# Patient Record
Sex: Male | Born: 1945 | Race: White | Hispanic: No | Marital: Married | Smoking: Former smoker
Health system: Southern US, Community
[De-identification: ages and names within clinical notes are randomized; demographics above are authoritative.]

## PROBLEM LIST (undated history)

## (undated) DIAGNOSIS — R972 Elevated prostate specific antigen [PSA]: Secondary | ICD-10-CM

## (undated) DIAGNOSIS — I517 Cardiomegaly: Secondary | ICD-10-CM

## (undated) DIAGNOSIS — F329 Major depressive disorder, single episode, unspecified: Secondary | ICD-10-CM

## (undated) DIAGNOSIS — K219 Gastro-esophageal reflux disease without esophagitis: Secondary | ICD-10-CM

## (undated) DIAGNOSIS — K635 Polyp of colon: Secondary | ICD-10-CM

## (undated) DIAGNOSIS — I219 Acute myocardial infarction, unspecified: Secondary | ICD-10-CM

## (undated) DIAGNOSIS — I4510 Unspecified right bundle-branch block: Secondary | ICD-10-CM

## (undated) DIAGNOSIS — M5136 Other intervertebral disc degeneration, lumbar region: Secondary | ICD-10-CM

## (undated) DIAGNOSIS — N200 Calculus of kidney: Secondary | ICD-10-CM

## (undated) DIAGNOSIS — I2510 Atherosclerotic heart disease of native coronary artery without angina pectoris: Secondary | ICD-10-CM

## (undated) DIAGNOSIS — C642 Malignant neoplasm of left kidney, except renal pelvis: Secondary | ICD-10-CM

## (undated) DIAGNOSIS — G4700 Insomnia, unspecified: Secondary | ICD-10-CM

## (undated) DIAGNOSIS — M51369 Other intervertebral disc degeneration, lumbar region without mention of lumbar back pain or lower extremity pain: Secondary | ICD-10-CM

## (undated) DIAGNOSIS — K449 Diaphragmatic hernia without obstruction or gangrene: Secondary | ICD-10-CM

## (undated) DIAGNOSIS — N400 Enlarged prostate without lower urinary tract symptoms: Secondary | ICD-10-CM

## (undated) DIAGNOSIS — M4802 Spinal stenosis, cervical region: Secondary | ICD-10-CM

## (undated) DIAGNOSIS — C679 Malignant neoplasm of bladder, unspecified: Secondary | ICD-10-CM

## (undated) DIAGNOSIS — F32A Depression, unspecified: Secondary | ICD-10-CM

## (undated) DIAGNOSIS — N183 Chronic kidney disease, stage 3 unspecified: Secondary | ICD-10-CM

## (undated) DIAGNOSIS — I4819 Other persistent atrial fibrillation: Secondary | ICD-10-CM

## (undated) DIAGNOSIS — E785 Hyperlipidemia, unspecified: Secondary | ICD-10-CM

## (undated) DIAGNOSIS — R000 Tachycardia, unspecified: Secondary | ICD-10-CM

## (undated) DIAGNOSIS — Z79899 Other long term (current) drug therapy: Secondary | ICD-10-CM

## (undated) DIAGNOSIS — K429 Umbilical hernia without obstruction or gangrene: Secondary | ICD-10-CM

## (undated) DIAGNOSIS — I10 Essential (primary) hypertension: Secondary | ICD-10-CM

## (undated) DIAGNOSIS — G4730 Sleep apnea, unspecified: Secondary | ICD-10-CM

## (undated) DIAGNOSIS — Z7901 Long term (current) use of anticoagulants: Secondary | ICD-10-CM

## (undated) DIAGNOSIS — I700 Atherosclerosis of aorta: Secondary | ICD-10-CM

## (undated) DIAGNOSIS — C649 Malignant neoplasm of unspecified kidney, except renal pelvis: Secondary | ICD-10-CM

## (undated) HISTORY — PX: OTHER SURGICAL HISTORY: SHX169

## (undated) HISTORY — DX: Major depressive disorder, single episode, unspecified: F32.9

## (undated) HISTORY — DX: Malignant neoplasm of left kidney, except renal pelvis: C64.2

## (undated) HISTORY — DX: Elevated prostate specific antigen (PSA): R97.20

## (undated) HISTORY — DX: Polyp of colon: K63.5

## (undated) HISTORY — DX: Depression, unspecified: F32.A

## (undated) HISTORY — DX: Acute myocardial infarction, unspecified: I21.9

## (undated) HISTORY — PX: CARDIAC ELECTROPHYSIOLOGY STUDY AND ABLATION: SHX1294

## (undated) HISTORY — PX: COLONOSCOPY: SHX174

## (undated) HISTORY — DX: Hyperlipidemia, unspecified: E78.5

---

## 2007-06-30 DIAGNOSIS — I471 Supraventricular tachycardia, unspecified: Secondary | ICD-10-CM

## 2007-06-30 HISTORY — PX: SVT ABLATION: EP1225

## 2007-06-30 HISTORY — DX: Supraventricular tachycardia, unspecified: I47.10

## 2016-07-15 DIAGNOSIS — K219 Gastro-esophageal reflux disease without esophagitis: Secondary | ICD-10-CM | POA: Diagnosis not present

## 2016-07-15 DIAGNOSIS — E785 Hyperlipidemia, unspecified: Secondary | ICD-10-CM | POA: Diagnosis not present

## 2016-07-15 DIAGNOSIS — G4700 Insomnia, unspecified: Secondary | ICD-10-CM | POA: Diagnosis not present

## 2016-09-14 DIAGNOSIS — Z9889 Other specified postprocedural states: Secondary | ICD-10-CM | POA: Diagnosis not present

## 2016-09-14 DIAGNOSIS — I471 Supraventricular tachycardia: Secondary | ICD-10-CM | POA: Diagnosis not present

## 2016-10-01 DIAGNOSIS — I10 Essential (primary) hypertension: Secondary | ICD-10-CM | POA: Diagnosis not present

## 2016-10-01 DIAGNOSIS — E784 Other hyperlipidemia: Secondary | ICD-10-CM | POA: Diagnosis not present

## 2016-10-06 DIAGNOSIS — Z1283 Encounter for screening for malignant neoplasm of skin: Secondary | ICD-10-CM | POA: Diagnosis not present

## 2016-10-06 DIAGNOSIS — L821 Other seborrheic keratosis: Secondary | ICD-10-CM | POA: Diagnosis not present

## 2016-10-09 DIAGNOSIS — I10 Essential (primary) hypertension: Secondary | ICD-10-CM | POA: Diagnosis not present

## 2016-10-09 DIAGNOSIS — E784 Other hyperlipidemia: Secondary | ICD-10-CM | POA: Diagnosis not present

## 2016-10-20 DIAGNOSIS — K219 Gastro-esophageal reflux disease without esophagitis: Secondary | ICD-10-CM | POA: Diagnosis not present

## 2016-10-20 DIAGNOSIS — E785 Hyperlipidemia, unspecified: Secondary | ICD-10-CM | POA: Diagnosis not present

## 2016-10-20 DIAGNOSIS — G4700 Insomnia, unspecified: Secondary | ICD-10-CM | POA: Diagnosis not present

## 2017-02-11 DIAGNOSIS — E784 Other hyperlipidemia: Secondary | ICD-10-CM | POA: Diagnosis not present

## 2017-02-11 DIAGNOSIS — I10 Essential (primary) hypertension: Secondary | ICD-10-CM | POA: Diagnosis not present

## 2017-02-11 DIAGNOSIS — Z125 Encounter for screening for malignant neoplasm of prostate: Secondary | ICD-10-CM | POA: Diagnosis not present

## 2017-02-23 DIAGNOSIS — R972 Elevated prostate specific antigen [PSA]: Secondary | ICD-10-CM | POA: Diagnosis not present

## 2017-02-23 DIAGNOSIS — I10 Essential (primary) hypertension: Secondary | ICD-10-CM | POA: Diagnosis not present

## 2017-02-23 DIAGNOSIS — E785 Hyperlipidemia, unspecified: Secondary | ICD-10-CM | POA: Diagnosis not present

## 2017-03-22 DIAGNOSIS — R972 Elevated prostate specific antigen [PSA]: Secondary | ICD-10-CM | POA: Diagnosis not present

## 2017-04-06 DIAGNOSIS — F323 Major depressive disorder, single episode, severe with psychotic features: Secondary | ICD-10-CM | POA: Diagnosis not present

## 2017-04-06 DIAGNOSIS — H2513 Age-related nuclear cataract, bilateral: Secondary | ICD-10-CM | POA: Diagnosis not present

## 2017-04-15 DIAGNOSIS — F322 Major depressive disorder, single episode, severe without psychotic features: Secondary | ICD-10-CM | POA: Diagnosis not present

## 2017-04-19 DIAGNOSIS — Z23 Encounter for immunization: Secondary | ICD-10-CM | POA: Diagnosis not present

## 2017-05-03 DIAGNOSIS — F321 Major depressive disorder, single episode, moderate: Secondary | ICD-10-CM | POA: Diagnosis not present

## 2017-06-02 DIAGNOSIS — F320 Major depressive disorder, single episode, mild: Secondary | ICD-10-CM | POA: Diagnosis not present

## 2017-07-06 DIAGNOSIS — F320 Major depressive disorder, single episode, mild: Secondary | ICD-10-CM | POA: Diagnosis not present

## 2017-08-02 DIAGNOSIS — F324 Major depressive disorder, single episode, in partial remission: Secondary | ICD-10-CM | POA: Diagnosis not present

## 2017-09-20 DIAGNOSIS — R972 Elevated prostate specific antigen [PSA]: Secondary | ICD-10-CM | POA: Diagnosis not present

## 2017-10-04 DIAGNOSIS — F320 Major depressive disorder, single episode, mild: Secondary | ICD-10-CM | POA: Diagnosis not present

## 2017-11-04 DIAGNOSIS — F320 Major depressive disorder, single episode, mild: Secondary | ICD-10-CM | POA: Diagnosis not present

## 2017-12-13 DIAGNOSIS — F325 Major depressive disorder, single episode, in full remission: Secondary | ICD-10-CM | POA: Diagnosis not present

## 2018-01-17 DIAGNOSIS — F4321 Adjustment disorder with depressed mood: Secondary | ICD-10-CM | POA: Diagnosis not present

## 2018-01-17 DIAGNOSIS — F325 Major depressive disorder, single episode, in full remission: Secondary | ICD-10-CM | POA: Diagnosis not present

## 2018-01-24 DIAGNOSIS — R972 Elevated prostate specific antigen [PSA]: Secondary | ICD-10-CM | POA: Diagnosis not present

## 2018-01-24 DIAGNOSIS — N3941 Urge incontinence: Secondary | ICD-10-CM | POA: Diagnosis not present

## 2018-03-10 DIAGNOSIS — F4321 Adjustment disorder with depressed mood: Secondary | ICD-10-CM | POA: Diagnosis not present

## 2018-03-10 DIAGNOSIS — F325 Major depressive disorder, single episode, in full remission: Secondary | ICD-10-CM | POA: Diagnosis not present

## 2018-03-14 ENCOUNTER — Encounter: Payer: Self-pay | Admitting: Emergency Medicine

## 2018-03-14 ENCOUNTER — Inpatient Hospital Stay
Admission: EM | Admit: 2018-03-14 | Discharge: 2018-03-16 | DRG: 251 | Disposition: A | Discharge status: Other Acute Inpatient Hospital | Payer: Medicare Other | Attending: Internal Medicine | Admitting: Internal Medicine

## 2018-03-14 ENCOUNTER — Other Ambulatory Visit: Payer: Self-pay

## 2018-03-14 ENCOUNTER — Ambulatory Visit (INDEPENDENT_AMBULATORY_CARE_PROVIDER_SITE_OTHER)
Admission: EM | Admit: 2018-03-14 | Discharge: 2018-03-14 | Disposition: A | Discharge status: Home / Self Care | Payer: Medicare Other | Source: Home / Self Care | Attending: Family Medicine | Admitting: Family Medicine

## 2018-03-14 ENCOUNTER — Emergency Department: Payer: Medicare Other

## 2018-03-14 DIAGNOSIS — R0789 Other chest pain: Secondary | ICD-10-CM | POA: Diagnosis not present

## 2018-03-14 DIAGNOSIS — Z79899 Other long term (current) drug therapy: Secondary | ICD-10-CM

## 2018-03-14 DIAGNOSIS — I25110 Atherosclerotic heart disease of native coronary artery with unstable angina pectoris: Secondary | ICD-10-CM | POA: Diagnosis present

## 2018-03-14 DIAGNOSIS — I2582 Chronic total occlusion of coronary artery: Secondary | ICD-10-CM | POA: Diagnosis present

## 2018-03-14 DIAGNOSIS — I208 Other forms of angina pectoris: Secondary | ICD-10-CM | POA: Diagnosis not present

## 2018-03-14 DIAGNOSIS — I481 Persistent atrial fibrillation: Secondary | ICD-10-CM | POA: Diagnosis present

## 2018-03-14 DIAGNOSIS — E860 Dehydration: Secondary | ICD-10-CM | POA: Diagnosis present

## 2018-03-14 DIAGNOSIS — Z8249 Family history of ischemic heart disease and other diseases of the circulatory system: Secondary | ICD-10-CM | POA: Diagnosis not present

## 2018-03-14 DIAGNOSIS — I4891 Unspecified atrial fibrillation: Secondary | ICD-10-CM

## 2018-03-14 DIAGNOSIS — R61 Generalized hyperhidrosis: Secondary | ICD-10-CM | POA: Diagnosis not present

## 2018-03-14 DIAGNOSIS — Z23 Encounter for immunization: Secondary | ICD-10-CM | POA: Diagnosis not present

## 2018-03-14 DIAGNOSIS — I214 Non-ST elevation (NSTEMI) myocardial infarction: Secondary | ICD-10-CM

## 2018-03-14 DIAGNOSIS — Z7982 Long term (current) use of aspirin: Secondary | ICD-10-CM

## 2018-03-14 DIAGNOSIS — Z79891 Long term (current) use of opiate analgesic: Secondary | ICD-10-CM

## 2018-03-14 DIAGNOSIS — R45851 Suicidal ideations: Secondary | ICD-10-CM

## 2018-03-14 DIAGNOSIS — R079 Chest pain, unspecified: Secondary | ICD-10-CM | POA: Diagnosis not present

## 2018-03-14 DIAGNOSIS — R7989 Other specified abnormal findings of blood chemistry: Secondary | ICD-10-CM | POA: Diagnosis present

## 2018-03-14 HISTORY — DX: Tachycardia, unspecified: R00.0

## 2018-03-14 HISTORY — DX: Non-ST elevation (NSTEMI) myocardial infarction: I21.4

## 2018-03-14 LAB — MAGNESIUM: Magnesium: 2.2 mg/dL (ref 1.7–2.4)

## 2018-03-14 LAB — PROTIME-INR
INR: 1.03
PROTHROMBIN TIME: 13.4 s (ref 11.4–15.2)

## 2018-03-14 LAB — T4, FREE: Free T4: 0.98 ng/dL (ref 0.82–1.77)

## 2018-03-14 LAB — URINALYSIS, COMPLETE (UACMP) WITH MICROSCOPIC
BACTERIA UA: NONE SEEN
Bilirubin Urine: NEGATIVE
Glucose, UA: NEGATIVE mg/dL
Ketones, ur: NEGATIVE mg/dL
Leukocytes, UA: NEGATIVE
Nitrite: NEGATIVE
PROTEIN: NEGATIVE mg/dL
SPECIFIC GRAVITY, URINE: 1.018 (ref 1.005–1.030)
Squamous Epithelial / LPF: NONE SEEN (ref 0–5)
pH: 5 (ref 5.0–8.0)

## 2018-03-14 LAB — CBC
HCT: 42.1 % (ref 40.0–52.0)
HEMOGLOBIN: 14.7 g/dL (ref 13.0–18.0)
MCH: 29.8 pg (ref 26.0–34.0)
MCHC: 34.9 g/dL (ref 32.0–36.0)
MCV: 85.6 fL (ref 80.0–100.0)
PLATELETS: 191 10*3/uL (ref 150–440)
RBC: 4.92 MIL/uL (ref 4.40–5.90)
RDW: 13.8 % (ref 11.5–14.5)
WBC: 8.4 10*3/uL (ref 3.8–10.6)

## 2018-03-14 LAB — BASIC METABOLIC PANEL
ANION GAP: 7 (ref 5–15)
BUN: 25 mg/dL — ABNORMAL HIGH (ref 8–23)
CHLORIDE: 106 mmol/L (ref 98–111)
CO2: 25 mmol/L (ref 22–32)
CREATININE: 1 mg/dL (ref 0.61–1.24)
Calcium: 9.2 mg/dL (ref 8.9–10.3)
GFR calc non Af Amer: >60 mL/min (ref >60)
Glucose, Bld: 112 mg/dL — ABNORMAL HIGH (ref 70–99)
Potassium: 4 mmol/L (ref 3.5–5.1)
SODIUM: 138 mmol/L (ref 135–145)

## 2018-03-14 LAB — TSH
TSH: 2.75 u[IU]/mL (ref 0.350–4.500)
TSH: 2.289 u[IU]/mL (ref 0.350–4.500)

## 2018-03-14 LAB — BRAIN NATRIURETIC PEPTIDE: B Natriuretic Peptide: 180 pg/mL — ABNORMAL HIGH (ref 0.0–100.0)

## 2018-03-14 LAB — HEPARIN LEVEL (UNFRACTIONATED)

## 2018-03-14 LAB — TROPONIN I
TROPONIN I: 0.48 ng/mL — AB (ref <0.03)
Troponin I: 2.52 ng/mL (ref <0.03)
Troponin I: 5.92 ng/mL (ref <0.03)

## 2018-03-14 LAB — APTT: aPTT: 25 seconds (ref 24–36)

## 2018-03-14 MED ORDER — HEPARIN BOLUS VIA INFUSION
2850.0000 [IU] | Freq: Once | INTRAVENOUS | Status: AC
  Administered 2018-03-14: 2850 [IU] via INTRAVENOUS
  Filled 2018-03-14: qty 2850

## 2018-03-14 MED ORDER — ASPIRIN EC 81 MG PO TBEC
81.0000 mg | DELAYED_RELEASE_TABLET | Freq: Every day | ORAL | Status: DC
  Administered 2018-03-15: 81 mg via ORAL
  Filled 2018-03-14: qty 1

## 2018-03-14 MED ORDER — HEPARIN (PORCINE) IN NACL 100-0.45 UNIT/ML-% IJ SOLN
1500.0000 [IU]/h | INTRAMUSCULAR | Status: DC
  Administered 2018-03-14: 1100 [IU]/h via INTRAVENOUS
  Filled 2018-03-14 (×2): qty 250

## 2018-03-14 MED ORDER — ASPIRIN 81 MG PO CHEW
324.0000 mg | CHEWABLE_TABLET | Freq: Once | ORAL | Status: AC
  Administered 2018-03-14: 324 mg via ORAL

## 2018-03-14 MED ORDER — METOPROLOL TARTRATE 25 MG PO TABS
12.5000 mg | ORAL_TABLET | Freq: Two times a day (BID) | ORAL | Status: DC
  Administered 2018-03-14 – 2018-03-15 (×4): 12.5 mg via ORAL
  Filled 2018-03-14 (×4): qty 1

## 2018-03-14 MED ORDER — ASPIRIN 81 MG PO CHEW: 324.0000 mg | CHEWABLE_TABLET | ORAL | Status: AC

## 2018-03-14 MED ORDER — TRAZODONE HCL 50 MG PO TABS
50.0000 mg | ORAL_TABLET | Freq: Every day | ORAL | Status: DC
  Administered 2018-03-14 – 2018-03-15 (×2): 50 mg via ORAL
  Filled 2018-03-14 (×3): qty 1

## 2018-03-14 MED ORDER — SODIUM CHLORIDE 0.9% FLUSH: 3.0000 mL | Freq: Two times a day (BID) | INTRAVENOUS | Status: DC

## 2018-03-14 MED ORDER — OXYCODONE-ACETAMINOPHEN 5-325 MG PO TABS: 1.0000 | ORAL_TABLET | Freq: Four times a day (QID) | ORAL | Status: DC | PRN

## 2018-03-14 MED ORDER — ACETAMINOPHEN 325 MG PO TABS: 650.0000 mg | ORAL_TABLET | ORAL | Status: DC | PRN

## 2018-03-14 MED ORDER — NITROGLYCERIN 0.4 MG SL SUBL
0.4000 mg | SUBLINGUAL_TABLET | SUBLINGUAL | Status: DC | PRN
  Administered 2018-03-16: 0.4 mg via SUBLINGUAL

## 2018-03-14 MED ORDER — SODIUM CHLORIDE 0.9 % IV SOLN: 250.0000 mL | INTRAVENOUS | Status: DC | PRN

## 2018-03-14 MED ORDER — INFLUENZA VAC SPLIT HIGH-DOSE 0.5 ML IM SUSY: 0.5000 mL | PREFILLED_SYRINGE | INTRAMUSCULAR | Status: DC

## 2018-03-14 MED ORDER — ASPIRIN 300 MG RE SUPP: 300.0000 mg | RECTAL | Status: AC

## 2018-03-14 MED ORDER — ZOLPIDEM TARTRATE 5 MG PO TABS
5.0000 mg | ORAL_TABLET | Freq: Every evening | ORAL | Status: DC | PRN
  Administered 2018-03-14: 5 mg via ORAL
  Filled 2018-03-14: qty 1

## 2018-03-14 MED ORDER — ASPIRIN 81 MG PO CHEW: 324.0000 mg | CHEWABLE_TABLET | Freq: Once | ORAL | Status: DC

## 2018-03-14 MED ORDER — HEPARIN SODIUM (PORCINE) 5000 UNIT/ML IJ SOLN
4000.0000 [IU] | Freq: Once | INTRAMUSCULAR | Status: AC
  Administered 2018-03-14: 4000 [IU] via INTRAVENOUS

## 2018-03-14 MED ORDER — ONDANSETRON HCL 4 MG/2ML IJ SOLN: 4.0000 mg | Freq: Four times a day (QID) | INTRAMUSCULAR | Status: DC | PRN

## 2018-03-14 MED ORDER — SODIUM CHLORIDE 0.9 % IV SOLN
INTRAVENOUS | Status: AC
  Administered 2018-03-14 – 2018-03-15 (×2): via INTRAVENOUS

## 2018-03-14 MED ORDER — ATORVASTATIN CALCIUM 20 MG PO TABS
40.0000 mg | ORAL_TABLET | Freq: Every day | ORAL | Status: DC
  Administered 2018-03-14 – 2018-03-15 (×2): 40 mg via ORAL
  Filled 2018-03-14 (×2): qty 2

## 2018-03-14 MED ORDER — ALPRAZOLAM 0.5 MG PO TABS
0.2500 mg | ORAL_TABLET | Freq: Two times a day (BID) | ORAL | Status: DC | PRN
  Administered 2018-03-15: 0.25 mg via ORAL
  Filled 2018-03-14: qty 1

## 2018-03-14 MED ORDER — MORPHINE SULFATE (PF) 4 MG/ML IV SOLN: 4.0000 mg | INTRAVENOUS | Status: DC | PRN

## 2018-03-14 MED ORDER — SODIUM CHLORIDE 0.9% FLUSH: 3.0000 mL | INTRAVENOUS | Status: DC | PRN

## 2018-03-14 NOTE — Consult Note (Signed)
ANTICOAGULATION CONSULT NOTE - Initial Consult  Pharmacy Consult for Heparin drip Indication: chest pain/ACS and atrial fibrillation  No Known Allergies  Patient Measurements: Height: 6' (182.9 cm) Weight: 209 lb 9.6 oz (95.1 kg) IBW/kg (Calculated) : 77.6 Heparin Dosing Weight: 95.1kg  Vital Signs: Temp: 97.8 F (36.6 C) (09/16 1207) Temp Source: Oral (09/16 1207) BP: 129/82 (09/16 1207) Pulse Rate: 59 (09/16 1207)  Labs: Recent Labs    03/14/18 0936  HGB 14.7  HCT 42.1  PLT 191  APTT 25  LABPROT 13.4  INR 1.03  CREATININE 1.00  TROPONINI 0.48*    Estimated Creatinine Clearance: 78.7 mL/min (by C-G formula based on SCr of 1 mg/dL).   Medical History: Past Medical History:  Diagnosis Date  . Tachycardia     Medications:  Scheduled:  . aspirin  324 mg Oral NOW   Or  . aspirin  300 mg Rectal NOW  . [START ON 03/15/2018] aspirin EC  81 mg Oral Daily  . atorvastatin  40 mg Oral q1800  . metoprolol tartrate  12.5 mg Oral BID  . sodium chloride flush  3 mL Intravenous Q12H  . traZODone  50 mg Oral QHS    Assessment: Patient is a 72 year old male who presents with chest pain. Found to be in afib. No anticoagulation PTA. Pharmacy consulted to manage heparin drip. Troponin positive at 0.48.   Goal of Therapy:  Heparin level 0.3-0.7 units/ml Monitor platelets by anticoagulation protocol: Yes   Plan:  Give 4000 units bolus x 1 Start heparin infusion at 1100 units/hr Check anti-Xa level in 6 hours and daily while on heparin Continue to monitor H&H and platelets  Alajiah Dutkiewicz D Edmonia Gonser, Pharm.D, BCPS Clinical Pharmacist 03/14/2018,12:43 PM

## 2018-03-14 NOTE — H&P (Signed)
[facility] NAME: David Mccullough    MR#:  [id number]  DATE OF BIRTH:  27-Mar-1946  DATE OF ADMISSION:  03/14/2018  PRIMARY CARE PHYSICIAN: Patient, No Pcp Per   REQUESTING/REFERRING PHYSICIAN: Dr. Mariea Clonts  CHIEF COMPLAINT:   Chief Complaint  Patient presents with  . Chest Pain   Chest pain since yesterday. HISTORY OF PRESENT ILLNESS:  Terry Bolotin  is a 72 y.o. 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  The patient started to have chest pain yesterday, which is in substernal area, tight without radiation.  The patient denies any headache or dizziness, no nausea or diaphoresis.  Troponin is elevated at 0.48.  EKG show A. fib.  PAST MEDICAL HISTORY:   Past Medical History:  Diagnosis Date  . Tachycardia     PAST SURGICAL HISTORY:   Past Surgical History:  Procedure Laterality Date  . CARDIAC ELECTROPHYSIOLOGY STUDY AND ABLATION     patient states due to V-tac    SOCIAL HISTORY:   Social History   Tobacco Use  . Smoking status: Never Smoker  . Smokeless tobacco: Never Used  Substance Use Topics  . Alcohol use: Not Currently    FAMILY HISTORY:   Family History  Problem Relation Age of Onset  . Heart disease Mother   . Heart disease Father   . Heart attack Father   . Arrhythmia Sister     DRUG ALLERGIES:  No Known Allergies  REVIEW OF SYSTEMS:   Review of Systems  Constitutional: Positive for malaise/fatigue. Negative for chills and fever.  HENT: Negative for sore throat.   Eyes: Negative for blurred vision and double vision.  Respiratory: Negative for cough, hemoptysis, shortness of breath, wheezing and stridor.   Cardiovascular: Positive for chest pain. Negative for palpitations, orthopnea, claudication and leg swelling.  Gastrointestinal: Negative for abdominal pain, blood in stool, diarrhea, melena, nausea and vomiting.  Genitourinary: Negative for  dysuria, flank pain and hematuria.  Musculoskeletal: Negative for back pain and joint pain.  Skin: Negative for rash.  Neurological: Negative for dizziness, sensory change, focal weakness, seizures, loss of consciousness, weakness and headaches.  Endo/Heme/Allergies: Negative for polydipsia.  Psychiatric/Behavioral: Negative for depression. The patient is not nervous/anxious.     MEDICATIONS AT HOME:   Prior to Admission medications   Medication Sig Start Date End Date Taking? Authorizing Provider  aspirin 81 MG chewable tablet Chew 81 mg by mouth at bedtime.    Yes [provider]  Calcium Carb-Cholecalciferol (CALCIUM 1000 + D PO) Take 1 tablet by mouth daily.    Yes [provider]  Multiple Vitamin (MULTIVITAMIN) tablet Take 1 tablet by mouth daily.   Yes [provider]  Omega-3 Fatty Acids (FISH OIL) 1200 MG CAPS Take 1 capsule by mouth daily.    Yes [provider]  traZODone (DESYREL) 50 MG tablet Take 50 mg by mouth at bedtime.    Yes [provider]      VITAL SIGNS:  Blood pressure (!) 131/93, pulse 67, temperature 98 F (36.7 C), temperature source Oral, resp. rate 16, height 6' (1.829 m), weight 90.7 kg, SpO2 97 %.  PHYSICAL EXAMINATION:  Physical Exam  GENERAL:  72 y.o.-year-old patient lying in the bed with no acute distress.  EYES: Pupils equal, round, reactive to light and accommodation. No scleral icterus. Extraocular muscles intact.  HEENT: Head atraumatic, normocephalic. Oropharynx and nasopharynx clear.  NECK:  Supple, no jugular venous distention. No thyroid enlargement, no tenderness.  LUNGS: Normal breath sounds bilaterally, no wheezing, rales,rhonchi or crepitation. No use of accessory muscles of respiration.  CARDIOVASCULAR: Irregular rate rhythm.  No murmurs, rubs, or gallops.  ABDOMEN: Soft, nontender, nondistended. Bowel sounds present. No organomegaly or mass.  EXTREMITIES: No pedal edema, cyanosis, or  clubbing.  NEUROLOGIC: Cranial nerves II through XII are intact. Muscle strength 5/5 in all extremities. Sensation intact. Gait not checked.  PSYCHIATRIC: The patient is alert and oriented x 3.  SKIN: No obvious rash, lesion, or ulcer.   LABORATORY PANEL:   CBC Recent Labs  Lab 03/14/18 0936  WBC 8.4  HGB 14.7  HCT 42.1  PLT 191   ------------------------------------------------------------------------------------------------------------------  Chemistries  Recent Labs  Lab 03/14/18 0936  NA 138  K 4.0  CL 106  CO2 25  GLUCOSE 112*  BUN 25*  CREATININE 1.00  CALCIUM 9.2   ------------------------------------------------------------------------------------------------------------------  Cardiac Enzymes Recent Labs  Lab 03/14/18 0936  TROPONINI 0.48*   ------------------------------------------------------------------------------------------------------------------  RADIOLOGY:  Dg Chest 2 View  Result Date: 03/14/2018 CLINICAL DATA:  Chest pain. EXAM: CHEST - 2 VIEW COMPARISON:  None. FINDINGS: Heart is borderline in size. No confluent airspace opacities. No effusions. No acute bony abnormality. Scattered aortic calcifications in the aortic arch. No visible aortic aneurysm. IMPRESSION: No acute cardiopulmonary disease. Electronically Signed   By: Rolm Baptise M.D.   On: 03/14/2018 10:21      IMPRESSION AND PLAN:   Non-STEMI The patient will be admitted to telemetry floor. Telemetry monitor, follow-up troponin, continue heparin drip, aspirin, start Lipitor, check lipid panel, echocardiograph and cardiology consult for possible cardiac cath.  New onset A. fib.  Heart rate is controlled. Continue heparin drip, start low-dose Lopressor.  Follow-up echocardiograph.  Mild dehydration.  Gentle IV fluid support.  All the records are reviewed and case discussed with ED provider. Management plans discussed with the patient, his wife and they are in agreement.  CODE  STATUS: Full code  TOTAL TIME TAKING CARE OF THIS PATIENT: 42 minutes.    Demetrios Loll M.D on 03/14/2018 at 11:12 AM  Between 7am to 6pm - Pager - [phone]  After 6pm go to [email or web address] - Proofreader  [facility]  Office  [phone]  CC: Primary care physician; Patient, No Pcp Per   Note: This dictation was prepared with Dragon dictation along with smaller phrase technology. Any transcriptional errors that result from this process are unin

## 2018-03-14 NOTE — Consult Note (Signed)
Reason for Consult: Non-STEMI elevated troponins Referring Physician: Dr. Bridgett Larsson hospitalist  David Mccullough is an 72 y.o. male.  HPI: 72 year old white male previous history of tachycardia status post ablation when in [place] about 10 years ago non-smoker states to be doing reasonably well started developing midsternal chest discomfort no blackout spells syncope coronary disease no smoking not on any significant cardiac medication.  With episode of chest pain at rest midsternal shortness of breath patient then developed elevated troponins and was subsequently admitted for further assessment.  Patient states no further chest pain since in hospital.  Denies any previous cardiac cath denies any myocardial infarction in the past PCI stent of coronary bypass no recent stroke now here for further cardiac assessment  Past Medical History:  Diagnosis Date  . Tachycardia     Past Surgical History:  Procedure Laterality Date  . CARDIAC ELECTROPHYSIOLOGY STUDY AND ABLATION     patient states due to V-tac    Family History  Problem Relation Age of Onset  . Heart disease Mother   . Heart disease Father   . Heart attack Father   . Arrhythmia Sister     Social History:  reports that he has never smoked. He has never used smokeless tobacco. He reports that he drank alcohol. He reports that he has current or past drug history.  Allergies: No Known Allergies  Medications: I have reviewed the patient's current medications.  Results for orders placed or performed during the hospital encounter of 03/14/18 (from the past 48 hour(s))  Basic metabolic panel     Status: Abnormal   Collection Time: 03/14/18  9:36 AM  Result Value Ref Range   Sodium 138 135 - 145 mmol/L   Potassium 4.0 3.5 - 5.1 mmol/L   Chloride 106 98 - 111 mmol/L   CO2 25 22 - 32 mmol/L   Glucose, Bld 112 (H) 70 - 99 mg/dL   BUN 25 (H) 8 - 23 mg/dL   Creatinine, Ser 1.00 0.61 - 1.24 mg/dL   Calcium 9.2 8.9 - 10.3 mg/dL   GFR calc  non Af Amer >60 >60 mL/min   GFR calc Af Amer >60 >60 mL/min    Comment: (NOTE) The eGFR has been calculated using the CKD EPI equation. This calculation has not been validated in all clinical situations. eGFR's persistently <60 mL/min signify possible Chronic Kidney Disease.    Anion gap 7 5 - 15    Comment: Performed at [facility], [address] [place], [place] [address]  CBC     Status: None   Collection Time: 03/14/18  9:36 AM  Result Value Ref Range   WBC 8.4 3.8 - 10.6 K/uL   RBC 4.92 4.40 - 5.90 MIL/uL   Hemoglobin 14.7 13.0 - 18.0 g/dL   HCT 42.1 40.0 - 52.0 %   MCV 85.6 80.0 - 100.0 fL   MCH 29.8 26.0 - 34.0 pg   MCHC 34.9 32.0 - 36.0 g/dL   RDW 13.8 11.5 - 14.5 %   Platelets 191 150 - 440 K/uL    Comment: Performed at [facility], [address] [place], [place] [address]  Troponin I     Status: Abnormal   Collection Time: 03/14/18  9:36 AM  Result Value Ref Range   Troponin I 0.48 (HH) <0.03 ng/mL    Comment: CRITICAL RESULT CALLED TO, READ BACK BY AND VERIFIED WITH MARY NEEDHAM AT 1016 03/14/18 DAS Performed at [facility], [address],  [place], [place] [address]   Brain natriuretic peptide     Status: Abnormal   Collection Time: 03/14/18  9:36 AM  Result Value Ref Range   B Natriuretic Peptide 180.0 (H) 0.0 - 100.0 pg/mL    Comment: Performed at [facility], [address] [place], [place] [address]  TSH     Status: None   Collection Time: 03/14/18  9:36 AM  Result Value Ref Range   TSH 2.750 0.350 - 4.500 uIU/mL    Comment: Performed by a 3rd Generation assay with a functional sensitivity of <=0.01 uIU/mL. Performed at [facility], [address] [place], [place] [address]   Protime-INR     Status: None   Collection Time: 03/14/18  9:36 AM  Result Value Ref Range   Prothrombin Time 13.4 11.4 - 15.2 seconds   INR 1.03     Comment: Performed at [facility], [address]  [place], [place] [address]  APTT     Status: None   Collection Time: 03/14/18  9:36 AM  Result Value Ref Range   aPTT 25 24 - 36 seconds    Comment: Performed at [facility], [address] [place], [place] [address]  Urinalysis, Complete w Microscopic     Status: Abnormal   Collection Time: 03/14/18 10:34 AM  Result Value Ref Range   Color, Urine YELLOW (A) YELLOW   APPearance CLEAR (A) CLEAR   Specific Gravity, Urine 1.018 1.005 - 1.030   pH 5.0 5.0 - 8.0   Glucose, UA NEGATIVE NEGATIVE mg/dL   Hgb urine dipstick SMALL (A) NEGATIVE   Bilirubin Urine NEGATIVE NEGATIVE   Ketones, ur NEGATIVE NEGATIVE mg/dL   Protein, ur NEGATIVE NEGATIVE mg/dL   Nitrite NEGATIVE NEGATIVE   Leukocytes, UA NEGATIVE NEGATIVE   RBC / HPF 0-5 0 - 5 RBC/hpf   WBC, UA 0-5 0 - 5 WBC/hpf   Bacteria, UA NONE SEEN NONE SEEN   Squamous Epithelial / LPF NONE SEEN 0 - 5   Mucus PRESENT     Comment: Performed at [facility], [address] [place], [place] [address]  Troponin I     Status: Abnormal   Collection Time: 03/14/18 12:32 PM  Result Value Ref Range   Troponin I 2.52 (HH) <0.03 ng/mL    Comment: CRITICAL VALUE NOTED.  VALUE IS CONSISTENT WITH PREVIOUSLY REPORTED AND CALLED VALUE. QSD Performed at [facility], [address] [place], [place] [address]   TSH     Status: None   Collection Time: 03/14/18 12:32 PM  Result Value Ref Range   TSH 2.289 0.350 - 4.500 uIU/mL    Comment: Performed by a 3rd Generation assay with a functional sensitivity of <=0.01 uIU/mL. Performed at [facility], [address] [place], [place] [address]   T4, free     Status: None   Collection Time: 03/14/18 12:32 PM  Result Value Ref Range   Free T4 0.98 0.82 - 1.77 ng/dL    Comment: (NOTE) Biotin ingestion may interfere with free T4 tests. If the results are inconsistent with the TSH level, previous test results, or the clinical presentation, then consider biotin  interference. If needed, order repeat testing after stopping biotin. Performed at [facility], [address] [place], [place] [address]   Magnesium     Status: None   Collection Time: 03/14/18 12:32 PM  Result Value Ref Range   Magnesium 2.2 1.7 - 2.4 mg/dL  Comment: Performed at [facility], [address] [place], [place] [address]  Troponin I     Status: Abnormal   Collection Time: 03/14/18  4:18 PM  Result Value Ref Range   Troponin I 5.92 (HH) <0.03 ng/mL    Comment: CRITICAL VALUE NOTED. VALUE IS CONSISTENT WITH PREVIOUSLY REPORTED/CALLED VALUE. JML Performed at [facility], [place], [place] [address]   Heparin level (unfractionated)     Status: Abnormal   Collection Time: 03/14/18  4:18 PM  Result Value Ref Range   Heparin Unfractionated <0.10 (L) 0.30 - 0.70 IU/mL    Comment: RESULT REPEATED AND VERIFIED (NOTE) If heparin results are below expected values, and patient dosage has  been confirmed, suggest follow up testing of antithrombin III levels. Performed at [facility], [address], [place],  [address]     Dg Chest 2 View  Result Date: 03/14/2018 CLINICAL DATA:  Chest pain. EXAM: CHEST - 2 VIEW COMPARISON:  None. FINDINGS: Heart is borderline in size. No confluent airspace opacities. No effusions. No acute bony abnormality. Scattered aortic calcifications in the aortic arch. No visible aortic aneurysm. IMPRESSION: No acute cardiopulmonary disease. Electronically Signed   By: Rolm Baptise M.D.   On: 03/14/2018 10:21    Review of Systems  Constitutional: Positive for malaise/fatigue.  Eyes: Negative.   Respiratory: Positive for shortness of breath.   Cardiovascular: Positive for chest pain.  Gastrointestinal: Negative.   Genitourinary: Negative.   Musculoskeletal: Negative.   Skin: Negative.   Neurological: Negative.   Endo/Heme/Allergies: Negative.   Psychiatric/Behavioral: Negative.     Blood pressure 115/77, pulse 71, temperature 98.5 F (36.9 C), temperature source Oral, resp. rate 18, height 6' (1.829 m), weight 95.1 kg, SpO2 97 %. Physical Exam  Constitutional: He is oriented to person, place, and time. He appears well-developed and well-nourished.  HENT:  Head: Normocephalic and atraumatic.  Eyes: Pupils are equal, round, and reactive to light. Conjunctivae and EOM are normal.  Neck: Normal range of motion. Neck supple.  Cardiovascular: Normal rate, regular rhythm and normal heart sounds.  Respiratory: Effort normal and breath sounds normal.  GI: Soft. Bowel sounds are normal.  Musculoskeletal: Normal range of motion.  Neurological: He is alert and oriented to person, place, and time. He has normal reflexes.  Skin: Skin is warm and dry.  Psychiatric: He has a normal mood and affect.    Assessment/Plan: Non-STEMI Unstable angina Chest pain Tachycardia status post ablation Elevated troponins . Plan Agree with admit to telemetry Follow-up EKGs and troponins Recommend short-term IV heparin therapy Consider ACE inhibitor beta-blocker Recommend statin therapy Proceed with invasive strategy with cardiac cath prior to discharge Echocardiogram will be helpful for further assessment Patient follow-up with cardiology upon discharge  David Mccullough D Ringsted 03/14/2018, 9:12 PM

## 2018-03-14 NOTE — Progress Notes (Signed)
Patient transferred into 240 from ED. Patient is awake, oriented, and able to move himself into the bed. His vitals are stable and he denies pain at this time. Heparin drip is infusing per order. Patient and wife both oriented to room and call bell. Will continue to monitor closely.

## 2018-03-14 NOTE — ED Triage Notes (Signed)
Patient complains of chest pain that started around 2-3 hours ago in center of his chest described as tightness. Patient states that he had an episode on the treadmill yesterday with chest pain that last for about 15 minutes and reappeared later in the day.

## 2018-03-14 NOTE — ED Notes (Signed)
Verified that lab has tubes to process new orders

## 2018-03-14 NOTE — Progress Notes (Signed)
CRITICAL VALUE ALERT  Critical Value:  Troponin 5.92  Date & Time Notied:  03/14/18 1800  Provider Notified: MD [place] paged  Orders Received/Actions taken: Awaiting new orders, patient denies chest pain, and vitals are stable. Will continue to monitor.

## 2018-03-14 NOTE — ED Triage Notes (Signed)
Pt via ems from [facility] with substernal chest pain that started yesterday while exercising. I went away when he stopped exercising; this morning he began to have cp, sob, diaphoresis again. Went to [facility] and was given 4x 81mg  aspirin. Pain resolved at this time. Pt alert & oriented. SI with plan reported; pt denies.

## 2018-03-14 NOTE — ED Provider Notes (Signed)
[facility]    CSN: [id number] Arrival date & time: 03/14/18  0867     History   Chief Complaint Chief Complaint  Patient presents with  . Chest Pain    HPI David Mccullough is a 72 y.o. male.   72 yo male with a c/o "chest pressure" that started this morning about 2-3 hours ago (7/10), now pain 2/10, and associated with "sweating". Patient states yesterday was exercising on a treadmill and started feeling the chest pressure 7/10, so he stopped, sat down and pressure was gone in about 5-10 minutes. 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 Denies any other personal cardiac history, but does have a strong family history of cardiac problems in his family (parents, sister).  Patient also states he's been under a lot of stress recently with his 74 yo son and patient is feeling depressed and suicidal. States he's thought about "jumping off a bridge". States he has a h/o depression and has taken lexapro in the past which he say "didn't seem to help".   The history is provided by the patient.  Chest Pain    History reviewed. No pertinent past medical history.  There are no active problems to display for this patient.   Past Surgical History:  Procedure Laterality Date  . CARDIAC ELECTROPHYSIOLOGY STUDY AND ABLATION     patient states due to V-tac       Home Medications    Prior to Admission medications   Medication Sig Start Date End Date Taking? Authorizing Provider  aspirin 81 MG chewable tablet Chew by mouth daily.   Yes [provider]  Calcium Carb-Cholecalciferol (CALCIUM 1000 + D PO) Take by mouth.   Yes [provider]  Multiple Vitamin (MULTIVITAMIN) tablet Take 1 tablet by mouth daily.   Yes [provider]  Omega-3 Fatty Acids (FISH OIL) 1200 MG CAPS Take by mouth.   Yes [provider]  traZODone (DESYREL) 100 MG tablet Take 100 mg by mouth at bedtime.   Yes  [provider]    Family History Family History  Problem Relation Age of Onset  . Heart disease Mother   . Heart disease Father     Social History Social History   Tobacco Use  . Smoking status: Never Smoker  . Smokeless tobacco: Never Used  Substance Use Topics  . Alcohol use: Not Currently  . Drug use: Not Currently     Allergies   Patient has no known allergies.   Review of Systems Review of Systems  Cardiovascular: Positive for chest pain.     Physical Exam Triage Vital Signs ED Triage Vitals  Enc Vitals Group     BP 03/14/18 0818 (!) 153/94     Pulse Rate 03/14/18 0818 70     Resp 03/14/18 0818 18     Temp 03/14/18 0818 97.6 F (36.4 C)     Temp Source 03/14/18 0818 Oral     SpO2 03/14/18 0818 99 %     Weight 03/14/18 0819 210 lb (95.3 kg)     Height 03/14/18 0819 6' (1.829 m)     Head Circumference --      Peak Flow --      Pain Score 03/14/18 0818 4     Pain Loc --      Pain Edu? --      Excl. in [place]? --    No data found.  Updated Vital Signs BP (!) 155/96 (BP Location: Right Arm)   Pulse 70   Temp 97.6 F (36.4 C) (Oral)   Resp 18   Ht 6' (1.829 m)   Wt 95.3 kg   SpO2 99%   BMI 28.48 kg/m   Visual Acuity Right Eye Distance:   Left Eye Distance:   Bilateral Distance:    Right Eye Near:   Left Eye Near:    Bilateral Near:     Physical Exam  Constitutional: He appears well-developed and well-nourished. No distress.  Cardiovascular: Normal rate, normal heart sounds and intact distal pulses. An irregularly irregular rhythm present.  Intermittently tachycardic  Pulmonary/Chest: Effort normal and breath sounds normal. No stridor. No respiratory distress. He has no wheezes. He has no rales.  Musculoskeletal: He exhibits no edema.  Skin: He is not diaphoretic.  Nursing note and vitals reviewed.    UC Treatments / Results  Labs (all labs ordered are listed, but only abnormal results are displayed) Labs Reviewed - No data  to display  EKG None  Radiology No results found.  Procedures ED EKG Date/Time: 03/14/2018 8:53 AM Performed by: Norval Gable, MD Authorized by: Norval Gable, MD   ECG reviewed by ED Physician in the absence of a cardiologist: yes   Previous ECG:    Previous ECG:  Unavailable Interpretation:    Interpretation: abnormal   Rate:    ECG rate assessment: normal   Rhythm:    Rhythm: atrial fibrillation   Ectopy:    Ectopy: none   QRS:    QRS axis:  Normal Conduction:    Conduction: abnormal   ST segments:    ST segments:  Normal T waves:    T waves: normal   Q waves:    Q waves:  II and III   (including critical care time)  Medications Ordered in UC Medications  aspirin chewable tablet 324 mg (324 mg Oral Given 03/14/18 0828)    Initial Impression / Assessment and Plan / UC Course  I have reviewed the triage vital signs and the nursing notes.  Pertinent labs & imaging results that were available during my care of the patient were reviewed by me and considered in my medical decision making (see chart for details).      Final Clinical Impressions(s) / UC Diagnoses   Final diagnoses:  Chest tightness or pressure  New onset atrial fibrillation (HCC)  Exertional angina (HCC)  Suicidal ideation     Discharge Instructions     Recommend patient go to Emergency Department by EMS  for further evaluation and management    ED Prescriptions    None      1. ekg results and diagnosis reviewed with patient; patient given 4 baby ASA (chewed); IV saline lock; recommend patient go to Emergency Department by EMS for further evaluation and management; patient in stable condition and verbalizes; report called to [facility] ED charge RN   Controlled Substance Prescriptions [place] Controlled Substance Registry consulted? Not Applicable   Norval Gable, MD 03/14/18 [phone]

## 2018-03-14 NOTE — Progress Notes (Signed)
CRITICAL VALUE ALERT  Critical Value:  Troponin 5.2  Date & Time Notied:  03/14/18 1500  Provider Notified: MD [place]  Orders Received/Actions taken: Awaiting return call, patient denies chest pain.

## 2018-03-14 NOTE — Progress Notes (Signed)
Upon asking patient standard admission questions RN discovered that he had responded yes to several questions about having thoughts of harming himself or others in the emergency room. This was not given to this RN during report from the ER RN.   RN asked wife to step out while these questions were again asked.   He stated that he has had these thoughts but is not currently having any thoughts of harming himself or anyone else. He also denies a plan.   He states these feelings are "due to stress caused by his adult son, their recent move from [place], [place], and their house sale falling through."  He states that he sees a psychiatrist, had a recent visit (x1 week ago), and felt "ok". He assures me that he has no current suicidial or homicidal thoughts at this time.  RN told him to please share with staff if any of these thoughts reoccur and that we take these things very seriously.   Charge RN Janett Billow made aware. Will continue to monitor.

## 2018-03-14 NOTE — ED Notes (Signed)
Pt ambulated to toilet with no difficulty.

## 2018-03-14 NOTE — Progress Notes (Signed)
Advanced Care Plan.  Purpose of Encounter: CODE STATUS Parties in Attendance: The patient, his wife and me. Patient's Decisional Capacity: Yes. Medical Story: David Mccullough  is a 72 y.o. 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  The patient started to have chest pain yesterday, which is in substernal area, tight without radiation.  The patient is being admitted for non-STEMI and a new onset A. fib.  I discussed with the patient about his current condition, prognosis and CODE STATUS.  The patient stated that he wants to be resuscitated and intubated to get him back.  Plan:  Code Status: Full code Time spent discussing advance care planning: 17 minutes.

## 2018-03-14 NOTE — Discharge Instructions (Signed)
Recommend patient go to Emergency Department by EMS  for further evaluation and management

## 2018-03-14 NOTE — ED Provider Notes (Addendum)
[facility] Emergency Department Provider Note  ____________________________________________  Time seen: Approximately 9:45 AM  I have reviewed the triage vital signs and the nursing notes.   HISTORY  Chief Complaint Chest Pain    HPI David Mccullough is a 72 y.o. male with a history of remote SVT status post ablation presenting for chest pain.  The patient reports that yesterday he had 2 episodes of a central chest "pressure"; the first 1 was while he was on the treadmill and eased off when he stopped exercising.  He then had a second episode while seated in the evening.  Today, the patient was standing while eating breakfast when he developed a central, nonradiating nonpleuritic chest "pressure" that was associated with diaphoresis; no shortness of breath, palpitations, lightheadedness or syncope, nausea or vomiting.  The patient went to the [place] clinic, where he was given aspirin.  At this time, he is symptom-free.  His EKG does show atrial fibrillation and the patient states he has never been diagnosed with atrial fibrillation in the past.  Last stress test was many years ago; no history of cardiac catheterization or recent risk stratification study.   History reviewed. No pertinent past medical history.  There are no active problems to display for this patient.   Past Surgical History:  Procedure Laterality Date  . CARDIAC ELECTROPHYSIOLOGY STUDY AND ABLATION     patient states due to V-tac    Current Outpatient Rx  . Order #: [id number] Class: Historical Med  . Order #: [id number] Class: Historical Med  . Order #: [id number] Class: Historical Med  . Order #: [id number] Class: Historical Med  . Order #: [id number] Class: Historical Med    Allergies Patient has no known allergies.  Family History  Problem Relation Age of Onset  . Heart disease Mother   . Heart disease Father     Social History Social History   Tobacco Use  . Smoking status: Never  Smoker  . Smokeless tobacco: Never Used  Substance Use Topics  . Alcohol use: Not Currently  . Drug use: Not Currently    Review of Systems Constitutional: No fever/chills.  No lightheadedness or syncope.  Positive diaphoresis. Eyes: No visual changes. ENT: No sore throat. No congestion or rhinorrhea. Cardiovascular: Positive central chest pain. Denies palpitations. Respiratory: Denies shortness of breath.  No cough. Gastrointestinal: No abdominal pain.  No nausea, no vomiting.  No diarrhea.  No constipation. Genitourinary: Negative for dysuria. Musculoskeletal: Negative for back pain.  No lower extremity swelling or calf pain. Skin: Negative for rash. Neurological: Negative for headaches. No focal numbness, tingling or weakness.     ____________________________________________   PHYSICAL EXAM:  VITAL SIGNS: ED Triage Vitals  Enc Vitals Group     BP 03/14/18 0929 (!) 131/93     Pulse Rate 03/14/18 0929 67     Resp 03/14/18 0929 16     Temp 03/14/18 0929 98 F (36.7 C)     Temp Source 03/14/18 0929 Oral     SpO2 03/14/18 0929 97 %     Weight 03/14/18 0930 200 lb (90.7 kg)     Height 03/14/18 0930 6' (1.829 m)     Head Circumference --      Peak Flow --      Pain Score 03/14/18 0930 0     Pain Loc --      Pain Edu? --      Excl. in [place]? --     Constitutional: Alert and oriented. Answers questions appropriately. Eyes:  Conjunctivae are normal.  EOMI. No scleral icterus. Head: Atraumatic. Nose: No congestion/rhinnorhea. Mouth/Throat: Mucous membranes are moist.  Neck: No stridor.  Supple.  Positive JVD, mild.  No meningismus. Cardiovascular: Normal rate, regular rhythm. No murmurs, rubs or gallops.  Respiratory: Normal respiratory effort.  No accessory muscle use or retractions. Lungs CTAB.  No wheezes, rales or ronchi. Gastrointestinal: Overweight.  Soft, nontender and nondistended.  No guarding or rebound.  No peritoneal signs. Musculoskeletal: No LE edema. No ttp  in the calves or palpable cords.  Negative Homan's sign. Neurologic:  A&Ox3.  Speech is clear.  Face and smile are symmetric.  EOMI.  Moves all extremities well. Skin:  Skin is warm, dry and intact. No rash noted. Psychiatric: Mood and affect are normal. Speech and behavior are normal.  Normal judgement.  ____________________________________________   LABS (all labs ordered are listed, but only abnormal results are displayed)  Labs Reviewed  CBC  BASIC METABOLIC PANEL  TROPONIN I  BRAIN NATRIURETIC PEPTIDE  TSH  PROTIME-INR  APTT  URINALYSIS, COMPLETE (UACMP) WITH MICROSCOPIC   ____________________________________________  EKG  ED ECG REPORT I, Anne-Caroline Mariea Clonts, the attending physician, personally viewed and interpreted this ECG.   Date: 03/14/2018  EKG Time: 928  Rate: 73  Rhythm: atrial fibrillation  Axis: normal  Intervals:none  ST&T Change: No STEMI  ____________________________________________  RADIOLOGY  Dg Chest 2 View  Result Date: 03/14/2018 CLINICAL DATA:  Chest pain. EXAM: CHEST - 2 VIEW COMPARISON:  None. FINDINGS: Heart is borderline in size. No confluent airspace opacities. No effusions. No acute bony abnormality. Scattered aortic calcifications in the aortic arch. No visible aortic aneurysm. IMPRESSION: No acute cardiopulmonary disease. Electronically Signed   By: Rolm Baptise M.D.   On: 03/14/2018 10:21    ____________________________________________   PROCEDURES  Procedure(s) performed: None  Procedures  Critical Care performed: Yes ____________________________________________   INITIAL IMPRESSION / ASSESSMENT AND PLAN / ED COURSE  Pertinent labs & imaging results that were available during my care of the patient were reviewed by me and considered in my medical decision making (see chart for details).  72 y.o. male with a remote history of SVT, presenting for 3 episodes of nonsustained chest pressure, and an EKG which shows new onset  A. fib fib.  Overall, the patient is hemodynamically stable and asymptomatic at this time.  I am concerned about his new onset A. fib, and possible unstable angina given his chest pain.  He has received aspirin and heparin is not indicated as he is chest pain-free at this time.  Laboratory studies and chest x-ray are pending.  The patient will require admission for continued evaluation and treatment.  ----------------------------------------- 10:26 AM on 03/14/2018 -----------------------------------------  The patient does have a troponin of 0.4, so heparin will be started given his constellation of symptoms and concern for an STEMI.  I have updated the patient and his wife about his findings and plan for admission at this time.  CRITICAL CARE Performed by: Eula Listen   Total critical care time: 40 minutes  Critical care time was exclusive of separately billable procedures and treating other patients.  Critical care was necessary to treat or prevent imminent or life-threatening deterioration.  Critical care was time spent personally by me on the following activities: development of treatment plan with patient and/or surrogate as well as nursing, discussions with consultants, evaluation of patient's response to treatment, examination of patient, obtaining history from patient or surrogate, ordering and performing treatments and interventions, ordering and  review of laboratory studies, ordering and review of radiographic studies, pulse oximetry and re-evaluation of patient's condition.   ____________________________________________  FINAL CLINICAL IMPRESSION(S) / ED DIAGNOSES  Final diagnoses:  New onset a-fib ([place])  Chest pain, unspecified type  Diaphoresis  NSTEMI (non-ST elevated myocardial infarction) ([place])         NEW MEDICATIONS STARTED DURING THIS VISIT:  New Prescriptions   No medications on file      Eula Listen, MD 03/14/18 1027    Eula Listen, MD 03/14/18 1027

## 2018-03-14 NOTE — Consult Note (Signed)
ANTICOAGULATION CONSULT NOTE - Initial Consult  Pharmacy Consult for Heparin drip Indication: chest pain/ACS and atrial fibrillation  No Known Allergies  Patient Measurements: Height: 6' (182.9 cm) Weight: 209 lb 9.6 oz (95.1 kg) IBW/kg (Calculated) : 77.6 Heparin Dosing Weight: 95.1kg  Vital Signs: Temp: 97.8 F (36.6 C) (09/16 1207) Temp Source: Oral (09/16 1207) BP: 129/82 (09/16 1207) Pulse Rate: 72 (09/16 1345)  Labs: Recent Labs    03/14/18 0936 03/14/18 1232 03/14/18 1618  HGB 14.7  --   --   HCT 42.1  --   --   PLT 191  --   --   APTT 25  --   --   LABPROT 13.4  --   --   INR 1.03  --   --   HEPARINUNFRC  --   --  <0.10*  CREATININE 1.00  --   --   TROPONINI 0.48* 2.52* 5.92*    Estimated Creatinine Clearance: 78.7 mL/min (by C-G formula based on SCr of 1 mg/dL).   Medical History: Past Medical History:  Diagnosis Date  . Tachycardia     Medications:  Scheduled:  . aspirin  324 mg Oral NOW   Or  . aspirin  300 mg Rectal NOW  . [START ON 03/15/2018] aspirin EC  81 mg Oral Daily  . atorvastatin  40 mg Oral q1800  . heparin  2,850 Units Intravenous Once  . [START ON 03/17/2018] Influenza vac split quadrivalent PF  0.5 mL Intramuscular Tomorrow-1000  . metoprolol tartrate  12.5 mg Oral BID  . sodium chloride flush  3 mL Intravenous Q12H  . traZODone  50 mg Oral QHS    Assessment: Patient is a 72 year old male who presents with chest pain. Found to be in afib. No anticoagulation PTA. Pharmacy consulted to manage heparin drip. Troponin positive at 0.48.   Goal of Therapy:  Heparin level 0.3-0.7 units/ml Monitor platelets by anticoagulation protocol: Yes   Plan:  Heparin level low at <0.10. I will bolus 2850 units and increase rate to 1500 units/hr. Recheck in 8 hours.  Ramond Dial, Pharm.D, BCPS Clinical Pharmacist 03/14/2018,6:15 PM

## 2018-03-15 ENCOUNTER — Encounter
Admission: EM | Disposition: A | Discharge status: Other Acute Inpatient Hospital | Payer: Self-pay | Source: Home / Self Care | Attending: Internal Medicine

## 2018-03-15 DIAGNOSIS — I429 Cardiomyopathy, unspecified: Secondary | ICD-10-CM

## 2018-03-15 DIAGNOSIS — I2510 Atherosclerotic heart disease of native coronary artery without angina pectoris: Secondary | ICD-10-CM

## 2018-03-15 HISTORY — DX: Atherosclerotic heart disease of native coronary artery without angina pectoris: I25.10

## 2018-03-15 HISTORY — DX: Cardiomyopathy, unspecified: I42.9

## 2018-03-15 HISTORY — PX: LEFT HEART CATH AND CORONARY ANGIOGRAPHY: CATH118249

## 2018-03-15 LAB — HEPARIN LEVEL (UNFRACTIONATED)
HEPARIN UNFRACTIONATED: 0.44 [IU]/mL (ref 0.30–0.70)
Heparin Unfractionated: 0.54 IU/mL (ref 0.30–0.70)

## 2018-03-15 LAB — CBC
HCT: 38.9 % — ABNORMAL LOW (ref 40.0–52.0)
Hemoglobin: 13.9 g/dL (ref 13.0–18.0)
MCH: 30.5 pg (ref 26.0–34.0)
MCHC: 35.7 g/dL (ref 32.0–36.0)
MCV: 85.4 fL (ref 80.0–100.0)
PLATELETS: 180 10*3/uL (ref 150–440)
RBC: 4.55 MIL/uL (ref 4.40–5.90)
RDW: 13.4 % (ref 11.5–14.5)
WBC: 9.2 10*3/uL (ref 3.8–10.6)

## 2018-03-15 LAB — LIPID PANEL
Cholesterol: 175 mg/dL (ref 0–200)
HDL: 30 mg/dL — ABNORMAL LOW (ref >40)
LDL Cholesterol: 110 mg/dL — ABNORMAL HIGH (ref 0–99)
Total CHOL/HDL Ratio: 5.8 RATIO
Triglycerides: 177 mg/dL — ABNORMAL HIGH (ref <150)
VLDL: 35 mg/dL (ref 0–40)

## 2018-03-15 SURGERY — LEFT HEART CATH AND CORONARY ANGIOGRAPHY
Anesthesia: Moderate Sedation

## 2018-03-15 MED ORDER — SODIUM CHLORIDE 0.9 % IV SOLN: 250.0000 mL | INTRAVENOUS | Status: DC | PRN

## 2018-03-15 MED ORDER — HEPARIN (PORCINE) IN NACL 1000-0.9 UT/500ML-% IV SOLN
INTRAVENOUS | Status: AC
  Filled 2018-03-15: qty 1000

## 2018-03-15 MED ORDER — HEPARIN (PORCINE) IN NACL 100-0.45 UNIT/ML-% IJ SOLN
1500.0000 [IU]/h | INTRAMUSCULAR | Status: DC
  Administered 2018-03-15 – 2018-03-16 (×2): 1500 [IU]/h via INTRAVENOUS
  Filled 2018-03-15: qty 250

## 2018-03-15 MED ORDER — FENTANYL CITRATE (PF) 100 MCG/2ML IJ SOLN
INTRAMUSCULAR | Status: DC | PRN
  Administered 2018-03-15: 25 ug via INTRAVENOUS

## 2018-03-15 MED ORDER — MIDAZOLAM HCL 2 MG/2ML IJ SOLN
INTRAMUSCULAR | Status: DC | PRN
  Administered 2018-03-15: 1 mg via INTRAVENOUS

## 2018-03-15 MED ORDER — FENTANYL CITRATE (PF) 100 MCG/2ML IJ SOLN
INTRAMUSCULAR | Status: AC
  Filled 2018-03-15: qty 2

## 2018-03-15 MED ORDER — SODIUM CHLORIDE 0.9 % WEIGHT BASED INFUSION: 1.0000 mL/kg/h | INTRAVENOUS | Status: DC

## 2018-03-15 MED ORDER — SODIUM CHLORIDE 0.9 % WEIGHT BASED INFUSION: 3.0000 mL/kg/h | INTRAVENOUS | Status: DC

## 2018-03-15 MED ORDER — SODIUM CHLORIDE 0.9% FLUSH: 3.0000 mL | Freq: Two times a day (BID) | INTRAVENOUS | Status: DC

## 2018-03-15 MED ORDER — ASPIRIN 81 MG PO CHEW: 81.0000 mg | CHEWABLE_TABLET | ORAL | Status: DC

## 2018-03-15 MED ORDER — MIDAZOLAM HCL 2 MG/2ML IJ SOLN
INTRAMUSCULAR | Status: AC
  Filled 2018-03-15: qty 2

## 2018-03-15 MED ORDER — SODIUM CHLORIDE 0.9% FLUSH: 3.0000 mL | INTRAVENOUS | Status: DC | PRN

## 2018-03-15 SURGICAL SUPPLY — 9 items
CATH INFINITI 5FR ANG PIGTAIL (CATHETERS) ×3 IMPLANT
CATH INFINITI 5FR JL4 (CATHETERS) ×3 IMPLANT
CATH INFINITI JR4 5F (CATHETERS) ×3 IMPLANT
DEVICE CLOSURE MYNXGRIP 5F (Vascular Products) ×3 IMPLANT
KIT MANI 3VAL PERCEP (MISCELLANEOUS) ×3 IMPLANT
NEEDLE PERC 18GX7CM (NEEDLE) ×3 IMPLANT
PACK CARDIAC CATH (CUSTOM PROCEDURE TRAY) ×3 IMPLANT
SHEATH AVANTI 5FR X 11CM (SHEATH) ×3 IMPLANT
WIRE GUIDERIGHT .035X150 (WIRE) ×3 IMPLANT

## 2018-03-15 NOTE — Plan of Care (Signed)
  Problem: Health Behavior/Discharge Planning: °Goal: Ability to manage health-related needs will improve °Outcome: Progressing °  °Problem: Clinical Measurements: °Goal: Ability to maintain clinical measurements within normal limits will improve °Outcome: Progressing °Goal: Diagnostic test results will improve °Outcome: Progressing °  °Problem: Pain Managment: °Goal: General experience of comfort will improve °Outcome: Progressing °  °

## 2018-03-15 NOTE — Consult Note (Addendum)
ANTICOAGULATION CONSULT NOTE - Initial Consult  Pharmacy Consult for Heparin drip Indication: chest pain/ACS and atrial fibrillation  No Known Allergies  Patient Measurements: Height: 6' (182.9 cm) Weight: 209 lb 9.6 oz (95.1 kg) IBW/kg (Calculated) : 77.6 Heparin Dosing Weight: 95.1kg  Vital Signs: Temp: 98.5 F (36.9 C) (09/16 1949) Temp Source: Oral (09/16 1949) BP: 115/77 (09/16 1949) Pulse Rate: 71 (09/16 1949)  Labs: Recent Labs    03/14/18 0936 03/14/18 1232 03/14/18 1618 03/14/18 2359  HGB 14.7  --   --   --   HCT 42.1  --   --   --   PLT 191  --   --   --   APTT 25  --   --   --   LABPROT 13.4  --   --   --   INR 1.03  --   --   --   HEPARINUNFRC  --   --  <0.10* 0.54  CREATININE 1.00  --   --   --   TROPONINI 0.48* 2.52* 5.92*  --     Estimated Creatinine Clearance: 78.7 mL/min (by C-G formula based on SCr of 1 mg/dL).   Medical History: Past Medical History:  Diagnosis Date  . Tachycardia     Medications:  Scheduled:  . aspirin  324 mg Oral NOW   Or  . aspirin  300 mg Rectal NOW  . [START ON 03/16/2018] aspirin  81 mg Oral Pre-Cath  . aspirin EC  81 mg Oral Daily  . atorvastatin  40 mg Oral q1800  . [START ON 03/17/2018] Influenza vac split quadrivalent PF  0.5 mL Intramuscular Tomorrow-1000  . metoprolol tartrate  12.5 mg Oral BID  . sodium chloride flush  3 mL Intravenous Q12H  . sodium chloride flush  3 mL Intravenous Q12H  . traZODone  50 mg Oral QHS    Assessment: Patient is a 72 year old male who presents with chest pain. Found to be in afib. No anticoagulation PTA. Pharmacy consulted to manage heparin drip. Troponin positive at 0.48.   Goal of Therapy:  Heparin level 0.3-0.7 units/ml Monitor platelets by anticoagulation protocol: Yes   Plan:  Heparin level low at <0.10. I will bolus 2850 units and increase rate to 1500 units/hr. Recheck in 8 hours.  9/16 2359 heparin level 0.54. Continue current regimen. Recheck heparin level and  CBC with AM labs.  9/17 AM heparin level 0.44. Continue current regimen. Recheck heparin level and CBC with tomorrow AM labs.  Sim Boast, PharmD, BCPS  03/15/18 5:27 AM

## 2018-03-15 NOTE — Consult Note (Signed)
ANTICOAGULATION CONSULT NOTE - Consult  Pharmacy Consult for Heparin drip Indication: chest pain/ACS and atrial fibrillation  No Known Allergies  Patient Measurements: Height: 6' (182.9 cm) Weight: 209 lb 9.6 oz (95.1 kg) IBW/kg (Calculated) : 77.6 Heparin Dosing Weight: 95.1kg  Vital Signs: Temp: 97.5 F (36.4 C) (09/17 0803) Temp Source: Oral (09/17 0803) BP: 138/85 (09/17 0803) Pulse Rate: 84 (09/17 0803)  Labs: Recent Labs    03/14/18 0936 03/14/18 1232 03/14/18 1618 03/14/18 2359 03/15/18 0324  HGB 14.7  --   --   --  13.9  HCT 42.1  --   --   --  38.9*  PLT 191  --   --   --  180  APTT 25  --   --   --   --   LABPROT 13.4  --   --   --   --   INR 1.03  --   --   --   --   HEPARINUNFRC  --   --  <0.10* 0.54 0.44  CREATININE 1.00  --   --   --   --   TROPONINI 0.48* 2.52* 5.92*  --   --     Estimated Creatinine Clearance: 78.7 mL/min (by C-G formula based on SCr of 1 mg/dL).    Assessment: Patient is a 72 year old male who presents with chest pain. Found to be in afib. No anticoagulation PTA. Pharmacy consulted to manage heparin drip. Troponin positive at 0.48.   Heparin Therapy Course:  9/16 Heparin level low at <0.10. I will bolus 2850 units and increase rate to 1500 units/hr. Recheck in 8 hours.  9/16 2359 heparin level 0.54. Continue current regimen. Recheck heparin level and CBC with AM labs.  9/17 0324 heparin level 0.44. Continue current regimen. Recheck heparin level and CBC with tomorrow AM labs.   Goal of Therapy:  Heparin level 0.3-0.7 units/ml Monitor platelets by anticoagulation protocol: Yes   Plan:  Two therapeutic levels overnight drawn 3.5 hours apart (see above) and level trending down. Will recheck heparin level at 1700 (midway through day) instead of tomorrow AM. CBC with tomorrow AM labs.  Pharmacy will continue to follow.  Rayna Sexton, PharmD, BCPS Clinical Pharmacist 03/15/2018 8:10 AM

## 2018-03-15 NOTE — Progress Notes (Signed)
Heparin drip stopped at 1:20 for heart cath

## 2018-03-15 NOTE — Consult Note (Signed)
ANTICOAGULATION CONSULT NOTE - Consult  Pharmacy Consult for Heparin drip Indication: chest pain/ACS and atrial fibrillation  No Known Allergies  Patient Measurements: Height: 6' (182.9 cm) Weight: 209 lb 9.6 oz (95.1 kg) IBW/kg (Calculated) : 77.6 Heparin Dosing Weight: 95.1kg  Vital Signs: Temp: 98.3 F (36.8 C) (09/17 1326) Temp Source: Oral (09/17 0803) BP: 113/99 (09/17 1530) Pulse Rate: 67 (09/17 1530)  Labs: Recent Labs    03/14/18 0936 03/14/18 1232 03/14/18 1618 03/14/18 2359 03/15/18 0324  HGB 14.7  --   --   --  13.9  HCT 42.1  --   --   --  38.9*  PLT 191  --   --   --  180  APTT 25  --   --   --   --   LABPROT 13.4  --   --   --   --   INR 1.03  --   --   --   --   HEPARINUNFRC  --   --  <0.10* 0.54 0.44  CREATININE 1.00  --   --   --   --   TROPONINI 0.48* 2.52* 5.92*  --   --     Estimated Creatinine Clearance: 78.7 mL/min (by C-G formula based on SCr of 1 mg/dL).    Assessment: Patient is a 72 year old male who presents with chest pain. Found to be in afib. No anticoagulation PTA. Pharmacy consulted to manage heparin drip. Troponin positive at 0.48.   Heparin Therapy Course:  9/16 Heparin level low at <0.10. I will bolus 2850 units and increase rate to 1500 units/hr. Recheck in 8 hours.  9/16 2359 heparin level 0.54. Continue current regimen. Recheck heparin level and CBC with AM labs.  9/17 0324 heparin level 0.44. Continue current regimen. Recheck heparin level and CBC with tomorrow AM labs.   Goal of Therapy:  Heparin level 0.3-0.7 units/ml Monitor platelets by anticoagulation protocol: Yes   Plan:  Heparin has been off for cardiac cath. Per discussion with [facility] RNs who paged Dr Clayborn Bigness, restart heparin drip at previous rate without a bolus at 1830 today. Will order heparin level for 6h after restart. CBC in AM.   Pharmacy will continue to follow.  Rayna Sexton, PharmD, BCPS Clinical Pharmacist 03/15/2018 4:00 PM

## 2018-03-16 ENCOUNTER — Inpatient Hospital Stay (HOSPITAL_COMMUNITY)
Admission: AD | Admit: 2018-03-16 | Discharge: 2018-03-30 | DRG: 235 | Disposition: A | Discharge status: Home Health | Payer: Medicare Other | Source: Other Acute Inpatient Hospital | Attending: Cardiothoracic Surgery | Admitting: Cardiothoracic Surgery

## 2018-03-16 ENCOUNTER — Encounter
Admission: EM | Disposition: A | Discharge status: Other Acute Inpatient Hospital | Payer: Self-pay | Source: Home / Self Care | Attending: Internal Medicine

## 2018-03-16 ENCOUNTER — Other Ambulatory Visit (HOSPITAL_COMMUNITY): Payer: Medicare Other

## 2018-03-16 ENCOUNTER — Other Ambulatory Visit: Payer: Self-pay | Admitting: *Deleted

## 2018-03-16 ENCOUNTER — Encounter (HOSPITAL_COMMUNITY): Payer: Self-pay | Admitting: Cardiovascular Disease

## 2018-03-16 DIAGNOSIS — J9811 Atelectasis: Secondary | ICD-10-CM | POA: Diagnosis not present

## 2018-03-16 DIAGNOSIS — J189 Pneumonia, unspecified organism: Secondary | ICD-10-CM | POA: Diagnosis not present

## 2018-03-16 DIAGNOSIS — I2510 Atherosclerotic heart disease of native coronary artery without angina pectoris: Secondary | ICD-10-CM

## 2018-03-16 DIAGNOSIS — Z01810 Encounter for preprocedural cardiovascular examination: Secondary | ICD-10-CM | POA: Diagnosis not present

## 2018-03-16 DIAGNOSIS — R918 Other nonspecific abnormal finding of lung field: Secondary | ICD-10-CM | POA: Diagnosis not present

## 2018-03-16 DIAGNOSIS — E860 Dehydration: Secondary | ICD-10-CM | POA: Diagnosis not present

## 2018-03-16 DIAGNOSIS — I482 Chronic atrial fibrillation: Secondary | ICD-10-CM | POA: Diagnosis not present

## 2018-03-16 DIAGNOSIS — I4819 Other persistent atrial fibrillation: Secondary | ICD-10-CM | POA: Diagnosis not present

## 2018-03-16 DIAGNOSIS — I2582 Chronic total occlusion of coronary artery: Secondary | ICD-10-CM | POA: Diagnosis not present

## 2018-03-16 DIAGNOSIS — I481 Persistent atrial fibrillation: Secondary | ICD-10-CM

## 2018-03-16 DIAGNOSIS — D72829 Elevated white blood cell count, unspecified: Secondary | ICD-10-CM | POA: Diagnosis present

## 2018-03-16 DIAGNOSIS — F329 Major depressive disorder, single episode, unspecified: Secondary | ICD-10-CM | POA: Diagnosis not present

## 2018-03-16 DIAGNOSIS — E1122 Type 2 diabetes mellitus with diabetic chronic kidney disease: Secondary | ICD-10-CM | POA: Diagnosis present

## 2018-03-16 DIAGNOSIS — Z79899 Other long term (current) drug therapy: Secondary | ICD-10-CM

## 2018-03-16 DIAGNOSIS — Z8249 Family history of ischemic heart disease and other diseases of the circulatory system: Secondary | ICD-10-CM

## 2018-03-16 DIAGNOSIS — R079 Chest pain, unspecified: Secondary | ICD-10-CM

## 2018-03-16 DIAGNOSIS — R509 Fever, unspecified: Secondary | ICD-10-CM | POA: Diagnosis not present

## 2018-03-16 DIAGNOSIS — I5031 Acute diastolic (congestive) heart failure: Secondary | ICD-10-CM | POA: Diagnosis not present

## 2018-03-16 DIAGNOSIS — I340 Nonrheumatic mitral (valve) insufficiency: Secondary | ICD-10-CM | POA: Diagnosis not present

## 2018-03-16 DIAGNOSIS — I25110 Atherosclerotic heart disease of native coronary artery with unstable angina pectoris: Secondary | ICD-10-CM | POA: Diagnosis not present

## 2018-03-16 DIAGNOSIS — Z6829 Body mass index (BMI) 29.0-29.9, adult: Secondary | ICD-10-CM

## 2018-03-16 DIAGNOSIS — I252 Old myocardial infarction: Secondary | ICD-10-CM

## 2018-03-16 DIAGNOSIS — I472 Ventricular tachycardia: Secondary | ICD-10-CM | POA: Diagnosis not present

## 2018-03-16 DIAGNOSIS — R749 Abnormal serum enzyme level, unspecified: Secondary | ICD-10-CM | POA: Diagnosis not present

## 2018-03-16 DIAGNOSIS — Z23 Encounter for immunization: Secondary | ICD-10-CM | POA: Diagnosis not present

## 2018-03-16 DIAGNOSIS — I200 Unstable angina: Secondary | ICD-10-CM | POA: Diagnosis not present

## 2018-03-16 DIAGNOSIS — E669 Obesity, unspecified: Secondary | ICD-10-CM | POA: Diagnosis not present

## 2018-03-16 DIAGNOSIS — Z4682 Encounter for fitting and adjustment of non-vascular catheter: Secondary | ICD-10-CM | POA: Diagnosis not present

## 2018-03-16 DIAGNOSIS — I5033 Acute on chronic diastolic (congestive) heart failure: Secondary | ICD-10-CM | POA: Diagnosis not present

## 2018-03-16 DIAGNOSIS — Z7982 Long term (current) use of aspirin: Secondary | ICD-10-CM | POA: Diagnosis not present

## 2018-03-16 DIAGNOSIS — Z955 Presence of coronary angioplasty implant and graft: Secondary | ICD-10-CM

## 2018-03-16 DIAGNOSIS — I371 Nonrheumatic pulmonary valve insufficiency: Secondary | ICD-10-CM | POA: Diagnosis not present

## 2018-03-16 DIAGNOSIS — I214 Non-ST elevation (NSTEMI) myocardial infarction: Secondary | ICD-10-CM | POA: Diagnosis not present

## 2018-03-16 DIAGNOSIS — Z951 Presence of aortocoronary bypass graft: Secondary | ICD-10-CM | POA: Diagnosis not present

## 2018-03-16 DIAGNOSIS — E785 Hyperlipidemia, unspecified: Secondary | ICD-10-CM | POA: Diagnosis present

## 2018-03-16 DIAGNOSIS — Z79891 Long term (current) use of opiate analgesic: Secondary | ICD-10-CM

## 2018-03-16 DIAGNOSIS — E876 Hypokalemia: Secondary | ICD-10-CM | POA: Diagnosis present

## 2018-03-16 DIAGNOSIS — I222 Subsequent non-ST elevation (NSTEMI) myocardial infarction: Secondary | ICD-10-CM | POA: Diagnosis not present

## 2018-03-16 DIAGNOSIS — Z452 Encounter for adjustment and management of vascular access device: Secondary | ICD-10-CM | POA: Diagnosis not present

## 2018-03-16 DIAGNOSIS — K5900 Constipation, unspecified: Secondary | ICD-10-CM | POA: Diagnosis not present

## 2018-03-16 DIAGNOSIS — Z7902 Long term (current) use of antithrombotics/antiplatelets: Secondary | ICD-10-CM | POA: Diagnosis not present

## 2018-03-16 DIAGNOSIS — Z09 Encounter for follow-up examination after completed treatment for conditions other than malignant neoplasm: Secondary | ICD-10-CM

## 2018-03-16 DIAGNOSIS — Z87891 Personal history of nicotine dependence: Secondary | ICD-10-CM | POA: Diagnosis not present

## 2018-03-16 DIAGNOSIS — N183 Chronic kidney disease, stage 3 (moderate): Secondary | ICD-10-CM | POA: Diagnosis present

## 2018-03-16 DIAGNOSIS — D62 Acute posthemorrhagic anemia: Secondary | ICD-10-CM | POA: Diagnosis not present

## 2018-03-16 DIAGNOSIS — I083 Combined rheumatic disorders of mitral, aortic and tricuspid valves: Secondary | ICD-10-CM | POA: Diagnosis not present

## 2018-03-16 DIAGNOSIS — I4891 Unspecified atrial fibrillation: Secondary | ICD-10-CM | POA: Diagnosis not present

## 2018-03-16 HISTORY — DX: Other persistent atrial fibrillation: I48.19

## 2018-03-16 HISTORY — DX: Atherosclerotic heart disease of native coronary artery without angina pectoris: I25.10

## 2018-03-16 HISTORY — PX: CORONARY STENT INTERVENTION: CATH118234

## 2018-03-16 LAB — BASIC METABOLIC PANEL
Anion gap: 7 (ref 5–15)
BUN: 24 mg/dL — ABNORMAL HIGH (ref 8–23)
CHLORIDE: 106 mmol/L (ref 98–111)
CO2: 25 mmol/L (ref 22–32)
Calcium: 9.1 mg/dL (ref 8.9–10.3)
Creatinine, Ser: 0.94 mg/dL (ref 0.61–1.24)
GFR calc Af Amer: >60 mL/min (ref >60)
GLUCOSE: 112 mg/dL — AB (ref 70–99)
POTASSIUM: 4 mmol/L (ref 3.5–5.1)
Sodium: 138 mmol/L (ref 135–145)

## 2018-03-16 LAB — CBC
HCT: 40.1 % (ref 40.0–52.0)
Hemoglobin: 14.2 g/dL (ref 13.0–18.0)
MCH: 30.3 pg (ref 26.0–34.0)
MCHC: 35.4 g/dL (ref 32.0–36.0)
MCV: 85.7 fL (ref 80.0–100.0)
PLATELETS: 192 10*3/uL (ref 150–440)
RBC: 4.68 MIL/uL (ref 4.40–5.90)
RDW: 13.6 % (ref 11.5–14.5)
WBC: 8.9 10*3/uL (ref 3.8–10.6)

## 2018-03-16 LAB — POCT ACTIVATED CLOTTING TIME
ACTIVATED CLOTTING TIME: 125 s
Activated Clotting Time: 290 seconds

## 2018-03-16 LAB — MRSA PCR SCREENING: MRSA by PCR: NEGATIVE

## 2018-03-16 LAB — HEPARIN LEVEL (UNFRACTIONATED)
HEPARIN UNFRACTIONATED: 0.32 [IU]/mL (ref 0.30–0.70)
Heparin Unfractionated: 0.29 IU/mL — ABNORMAL LOW (ref 0.30–0.70)

## 2018-03-16 SURGERY — CORONARY STENT INTERVENTION
Anesthesia: Moderate Sedation

## 2018-03-16 MED ORDER — SODIUM CHLORIDE 0.9 % IV SOLN
250.0000 mL | INTRAVENOUS | Status: DC | PRN
  Administered 2018-03-19: 250 mL via INTRAVENOUS
  Administered 2018-03-22: 11:00:00 via INTRAVENOUS

## 2018-03-16 MED ORDER — BIVALIRUDIN TRIFLUOROACETATE 250 MG IV SOLR
INTRAVENOUS | Status: AC
  Filled 2018-03-16: qty 250

## 2018-03-16 MED ORDER — MIDAZOLAM HCL 2 MG/2ML IJ SOLN
INTRAMUSCULAR | Status: DC | PRN
  Administered 2018-03-16: 1 mg via INTRAVENOUS

## 2018-03-16 MED ORDER — SODIUM CHLORIDE 0.9 % WEIGHT BASED INFUSION
1.0000 mL/kg/h | INTRAVENOUS | Status: DC
  Administered 2018-03-16: 1 mL/kg/h via INTRAVENOUS

## 2018-03-16 MED ORDER — NITROGLYCERIN 0.4 MG SL SUBL
SUBLINGUAL_TABLET | SUBLINGUAL | Status: AC
  Filled 2018-03-16: qty 1

## 2018-03-16 MED ORDER — SODIUM CHLORIDE 0.9% FLUSH: 3.0000 mL | INTRAVENOUS | Status: DC | PRN

## 2018-03-16 MED ORDER — SODIUM CHLORIDE 0.9 % IV SOLN: 250.0000 mL | INTRAVENOUS | Status: DC | PRN

## 2018-03-16 MED ORDER — METOPROLOL TARTRATE 25 MG PO TABS: 12.5000 mg | ORAL_TABLET | Freq: Two times a day (BID) | ORAL | 0 refills | Status: DC

## 2018-03-16 MED ORDER — MIDAZOLAM HCL 2 MG/2ML IJ SOLN
INTRAMUSCULAR | Status: AC
  Filled 2018-03-16: qty 2

## 2018-03-16 MED ORDER — ACETAMINOPHEN 325 MG PO TABS: 650.0000 mg | ORAL_TABLET | ORAL | Status: DC | PRN

## 2018-03-16 MED ORDER — ASPIRIN 81 MG PO CHEW: 81.0000 mg | CHEWABLE_TABLET | Freq: Every day | ORAL | Status: DC

## 2018-03-16 MED ORDER — ASPIRIN EC 81 MG PO TBEC
81.0000 mg | DELAYED_RELEASE_TABLET | Freq: Every day | ORAL | Status: DC
  Administered 2018-03-17 – 2018-03-21 (×5): 81 mg via ORAL
  Filled 2018-03-16 (×5): qty 1

## 2018-03-16 MED ORDER — CLOPIDOGREL BISULFATE 75 MG PO TABS: 75.0000 mg | ORAL_TABLET | Freq: Every day | ORAL | 0 refills | Status: DC

## 2018-03-16 MED ORDER — ONDANSETRON HCL 4 MG/2ML IJ SOLN: 4.0000 mg | Freq: Four times a day (QID) | INTRAMUSCULAR | Status: DC | PRN

## 2018-03-16 MED ORDER — NITROGLYCERIN 0.4 MG SL SUBL: 0.4000 mg | SUBLINGUAL_TABLET | SUBLINGUAL | 12 refills | Status: DC | PRN

## 2018-03-16 MED ORDER — CLOPIDOGREL BISULFATE 75 MG PO TABS: 75.0000 mg | ORAL_TABLET | Freq: Every day | ORAL | Status: DC

## 2018-03-16 MED ORDER — NITROGLYCERIN 1 MG/10 ML FOR IR/CATH LAB
INTRA_ARTERIAL | Status: DC | PRN
  Administered 2018-03-16: 200 ug via INTRACORONARY

## 2018-03-16 MED ORDER — METOPROLOL TARTRATE 12.5 MG HALF TABLET
12.5000 mg | ORAL_TABLET | Freq: Two times a day (BID) | ORAL | Status: DC
  Administered 2018-03-16: 12.5 mg via ORAL
  Filled 2018-03-16: qty 1

## 2018-03-16 MED ORDER — HEPARIN (PORCINE) IN NACL 100-0.45 UNIT/ML-% IJ SOLN
INTRAMUSCULAR | Status: AC
  Administered 2018-03-16: 1500 [IU]/h via INTRAVENOUS
  Filled 2018-03-16: qty 250

## 2018-03-16 MED ORDER — HEPARIN (PORCINE) IN NACL 100-0.45 UNIT/ML-% IJ SOLN: 1500.0000 [IU]/h | INTRAMUSCULAR | Status: DC

## 2018-03-16 MED ORDER — CLOPIDOGREL BISULFATE 300 MG PO TABS: 600.0000 mg | ORAL_TABLET | ORAL | 0 refills | Status: DC

## 2018-03-16 MED ORDER — ATORVASTATIN CALCIUM 40 MG PO TABS: 40.0000 mg | ORAL_TABLET | Freq: Every day | ORAL | 0 refills | Status: DC

## 2018-03-16 MED ORDER — SODIUM CHLORIDE 0.9% FLUSH
3.0000 mL | Freq: Two times a day (BID) | INTRAVENOUS | Status: DC
  Administered 2018-03-16: 3 mL via INTRAVENOUS

## 2018-03-16 MED ORDER — NITROGLYCERIN 5 MG/ML IV SOLN
INTRAVENOUS | Status: AC
  Filled 2018-03-16: qty 10

## 2018-03-16 MED ORDER — ATORVASTATIN CALCIUM 80 MG PO TABS
80.0000 mg | ORAL_TABLET | Freq: Every day | ORAL | Status: DC
  Administered 2018-03-16 – 2018-03-29 (×13): 80 mg via ORAL
  Filled 2018-03-16 (×14): qty 1

## 2018-03-16 MED ORDER — BIVALIRUDIN BOLUS VIA INFUSION - CUPID
INTRAVENOUS | Status: DC | PRN
  Administered 2018-03-16: 71.325 mg via INTRAVENOUS

## 2018-03-16 MED ORDER — SODIUM CHLORIDE 0.9 % WEIGHT BASED INFUSION: 1.0000 mL/kg/h | INTRAVENOUS | Status: AC

## 2018-03-16 MED ORDER — CLOPIDOGREL BISULFATE 75 MG PO TABS
ORAL_TABLET | ORAL | Status: AC
  Filled 2018-03-16: qty 8

## 2018-03-16 MED ORDER — ASPIRIN 81 MG PO CHEW
CHEWABLE_TABLET | ORAL | Status: AC
  Filled 2018-03-16: qty 4

## 2018-03-16 MED ORDER — FENTANYL CITRATE (PF) 100 MCG/2ML IJ SOLN
INTRAMUSCULAR | Status: AC
  Filled 2018-03-16: qty 2

## 2018-03-16 MED ORDER — CLOPIDOGREL BISULFATE 75 MG PO TABS
ORAL_TABLET | ORAL | Status: AC
  Filled 2018-03-16: qty 1

## 2018-03-16 MED ORDER — ASPIRIN 81 MG PO CHEW
CHEWABLE_TABLET | ORAL | Status: DC | PRN
  Administered 2018-03-16: 324 mg via ORAL

## 2018-03-16 MED ORDER — TRAZODONE HCL 50 MG PO TABS
50.0000 mg | ORAL_TABLET | Freq: Every day | ORAL | Status: DC
  Administered 2018-03-16 – 2018-03-21 (×6): 50 mg via ORAL
  Filled 2018-03-16 (×6): qty 1

## 2018-03-16 MED ORDER — CLOPIDOGREL BISULFATE 75 MG PO TABS: 600.0000 mg | ORAL_TABLET | ORAL | Status: DC

## 2018-03-16 MED ORDER — ADULT MULTIVITAMIN W/MINERALS CH
1.0000 | ORAL_TABLET | Freq: Every day | ORAL | Status: DC
  Administered 2018-03-17 – 2018-03-21 (×5): 1 via ORAL
  Filled 2018-03-16 (×5): qty 1

## 2018-03-16 MED ORDER — ASPIRIN 81 MG PO CHEW: 81.0000 mg | CHEWABLE_TABLET | ORAL | Status: DC

## 2018-03-16 MED ORDER — HEPARIN (PORCINE) IN NACL 100-0.45 UNIT/ML-% IJ SOLN: 1500.0000 [IU]/h | INTRAMUSCULAR | 0 refills | Status: DC

## 2018-03-16 MED ORDER — ACETAMINOPHEN 325 MG PO TABS
650.0000 mg | ORAL_TABLET | ORAL | Status: DC | PRN
  Administered 2018-03-17 – 2018-03-18 (×2): 650 mg via ORAL
  Filled 2018-03-16 (×2): qty 2

## 2018-03-16 MED ORDER — SODIUM CHLORIDE 0.9 % WEIGHT BASED INFUSION: 1.0000 mL/kg/h | INTRAVENOUS | Status: DC

## 2018-03-16 MED ORDER — CLOPIDOGREL BISULFATE 75 MG PO TABS
ORAL_TABLET | ORAL | Status: DC | PRN
  Administered 2018-03-16: 600 mg via ORAL

## 2018-03-16 MED ORDER — SODIUM CHLORIDE 0.9 % WEIGHT BASED INFUSION
3.0000 mL/kg/h | INTRAVENOUS | Status: DC
  Administered 2018-03-16: 3 mL/kg/h via INTRAVENOUS

## 2018-03-16 MED ORDER — SODIUM CHLORIDE 0.9% FLUSH: 3.0000 mL | Freq: Two times a day (BID) | INTRAVENOUS | Status: DC

## 2018-03-16 MED ORDER — NITROGLYCERIN 0.4 MG SL SUBL
0.4000 mg | SUBLINGUAL_TABLET | SUBLINGUAL | Status: DC | PRN
  Administered 2018-03-16: 0.4 mg via SUBLINGUAL
  Filled 2018-03-16: qty 1

## 2018-03-16 MED ORDER — SODIUM CHLORIDE 0.9% FLUSH
3.0000 mL | Freq: Two times a day (BID) | INTRAVENOUS | Status: DC
  Administered 2018-03-16: 3 mL via INTRAVENOUS
  Administered 2018-03-17: 10 mL via INTRAVENOUS
  Administered 2018-03-17 – 2018-03-21 (×9): 3 mL via INTRAVENOUS

## 2018-03-16 MED ORDER — FENTANYL CITRATE (PF) 100 MCG/2ML IJ SOLN
INTRAMUSCULAR | Status: DC | PRN
  Administered 2018-03-16 (×2): 50 ug via INTRAVENOUS

## 2018-03-16 MED ORDER — NITROGLYCERIN 0.4 MG SL SUBL: 0.4000 mg | SUBLINGUAL_TABLET | SUBLINGUAL | Status: DC | PRN

## 2018-03-16 MED ORDER — HEPARIN (PORCINE) IN NACL 100-0.45 UNIT/ML-% IJ SOLN
2400.0000 [IU]/h | INTRAMUSCULAR | Status: DC
  Administered 2018-03-17: 1600 [IU]/h via INTRAVENOUS
  Administered 2018-03-18: 2000 [IU]/h via INTRAVENOUS
  Administered 2018-03-18: 1800 [IU]/h via INTRAVENOUS
  Administered 2018-03-19 – 2018-03-20 (×2): 2000 [IU]/h via INTRAVENOUS
  Administered 2018-03-21 – 2018-03-22 (×3): 2400 [IU]/h via INTRAVENOUS
  Filled 2018-03-16 (×11): qty 250

## 2018-03-16 MED ORDER — NITROGLYCERIN 0.4 MG SL SUBL
SUBLINGUAL_TABLET | SUBLINGUAL | Status: DC | PRN
  Administered 2018-03-16: .4 mg via SUBLINGUAL

## 2018-03-16 MED ORDER — SODIUM CHLORIDE 0.9 % IV SOLN
INTRAVENOUS | Status: DC | PRN
  Administered 2018-03-16: 1.75 mg/kg/h via INTRAVENOUS

## 2018-03-16 SURGICAL SUPPLY — 11 items
BALLN MINITREK RX 1.5X15 (BALLOONS) ×3
BALLOON MINITREK RX 1.5X15 (BALLOONS) ×1 IMPLANT
CATH VISTA GUIDE 6FR XB3.5 SH (CATHETERS) ×3 IMPLANT
DEVICE CLOSURE MYNXGRIP 6/7F (Vascular Products) ×3 IMPLANT
DEVICE INFLAT 30 PLUS (MISCELLANEOUS) ×3 IMPLANT
KIT MANI 3VAL PERCEP (MISCELLANEOUS) ×3 IMPLANT
NEEDLE PERC 18GX7CM (NEEDLE) ×3 IMPLANT
PACK CARDIAC CATH (CUSTOM PROCEDURE TRAY) ×3 IMPLANT
SHEATH AVANTI 6FR X 11CM (SHEATH) ×3 IMPLANT
WIRE G HI TQ BMW 190 (WIRE) ×3 IMPLANT
WIRE GUIDERIGHT .035X150 (WIRE) ×3 IMPLANT

## 2018-03-16 NOTE — Progress Notes (Signed)
[facility] [place] NAME: David Mccullough    MR#:  [id number]  DATE OF BIRTH:  05-Dec-1945  SUBJECTIVE:  CHIEF COMPLAINT:   Chief Complaint  Patient presents with  . Chest Pain   Came with Chest pain, No pain now. On Heparin drip.   Plan for cath today.  REVIEW OF SYSTEMS:  CONSTITUTIONAL: No fever, fatigue or weakness.  EYES: No blurred or double vision.  EARS, NOSE, AND THROAT: No tinnitus or ear pain.  RESPIRATORY: No cough, shortness of breath, wheezing or hemoptysis.  CARDIOVASCULAR: No chest pain, orthopnea, edema.  GASTROINTESTINAL: No nausea, vomiting, diarrhea or abdominal pain.  GENITOURINARY: No dysuria, hematuria.  ENDOCRINE: No polyuria, nocturia,  HEMATOLOGY: No anemia, easy bruising or bleeding SKIN: No rash or lesion. MUSCULOSKELETAL: No joint pain or arthritis.   NEUROLOGIC: No tingling, numbness, weakness.  PSYCHIATRY: No anxiety or depression.   ROS  DRUG ALLERGIES:  No Known Allergies  VITALS:  Blood pressure 117/79, pulse 65, temperature 97.8 F (36.6 C), temperature source Oral, resp. rate 18, height 6' (1.829 m), weight 95.1 kg, SpO2 97 %.  PHYSICAL EXAMINATION:  GENERAL:  72 y.o.-year-old patient lying in the bed with no acute distress.  EYES: Pupils equal, round, reactive to light and accommodation. No scleral icterus. Extraocular muscles intact.  HEENT: Head atraumatic, normocephalic. Oropharynx and nasopharynx clear.  NECK:  Supple, no jugular venous distention. No thyroid enlargement, no tenderness.  LUNGS: Normal breath sounds bilaterally, no wheezing, rales,rhonchi or crepitation. No use of accessory muscles of respiration.  CARDIOVASCULAR: S1, S2 normal. No murmurs, rubs, or gallops.  ABDOMEN: Soft, nontender, nondistended. Bowel sounds present. No organomegaly or mass.  EXTREMITIES: No pedal edema, cyanosis, or clubbing.  NEUROLOGIC: Cranial nerves II through XII are intact. Muscle strength 5/5 in  all extremities. Sensation intact. Gait not checked.  PSYCHIATRIC: The patient is alert and oriented x 3.  SKIN: No obvious rash, lesion, or ulcer.   Physical Exam LABORATORY PANEL:   CBC Recent Labs  Lab 03/16/18 0230  WBC 8.9  HGB 14.2  HCT 40.1  PLT 192   ------------------------------------------------------------------------------------------------------------------  Chemistries  Recent Labs  Lab 03/14/18 1232 03/16/18 0230  NA  --  138  K  --  4.0  CL  --  106  CO2  --  25  GLUCOSE  --  112*  BUN  --  24*  CREATININE  --  0.94  CALCIUM  --  9.1  MG 2.2  --    ------------------------------------------------------------------------------------------------------------------  Cardiac Enzymes Recent Labs  Lab 03/14/18 1232 03/14/18 1618  TROPONINI 2.52* 5.92*   ------------------------------------------------------------------------------------------------------------------  RADIOLOGY:  Dg Chest 2 View  Result Date: 03/14/2018 CLINICAL DATA:  Chest pain. EXAM: CHEST - 2 VIEW COMPARISON:  None. FINDINGS: Heart is borderline in size. No confluent airspace opacities. No effusions. No acute bony abnormality. Scattered aortic calcifications in the aortic arch. No visible aortic aneurysm. IMPRESSION: No acute cardiopulmonary disease. Electronically Signed   By: Rolm Baptise M.D.   On: 03/14/2018 10:21    ASSESSMENT AND PLAN:   Active Problems:   NSTEMI (non-ST elevated myocardial infarction) (HCC)  * Non-STEMI Telemetry monitor, follow-up troponin, continue heparin drip, aspirin, start Lipitor, check lipid panel- LDL > 100, echocardiograph and cardiology consult for cardiac cath.  * New onset A. fib.  Heart rate is controlled. Continue heparin drip, start low-dose Lopressor.  Follow-up echocardiograph.  * Mild dehydration.  Gentle IV fluid support.   All  the records are reviewed and case discussed with Care Management/Social Workerr. Management plans  discussed with the patient, family and they are in agreement.  CODE STATUS: Full.  TOTAL TIME TAKING CARE OF THIS PATIENT: 40 minutes.   Discussed with pt and his wife in room.  POSSIBLE D/C IN 1-2 DAYS, DEPENDING ON CLINICAL CONDITION.   Vaughan Basta M.D on 03/16/2018   Between 7am to 6pm - Pager - [phone]  After 6pm go to [email or web address] - password [email or web address] [facility]  Office  [phone]  CC: Primary care physician; Patient, No Pcp Per  Note: This dictation was prepared with Dragon dictation along with smaller phrase technology. Any transcriptional errors that result from this process are unintentional.

## 2018-03-16 NOTE — Progress Notes (Signed)
ANTICOAGULATION CONSULT NOTE - Initial Consult  Pharmacy Consult for heparin Indication: atrial fibrillation  No Known Allergies  Patient Measurements:   Heparin Dosing Weight: 95kg  Vital Signs: Temp: 97.8 F (36.6 C) (09/18 0726) Temp Source: Oral (09/18 0726) BP: 88/70 (09/18 1205) Pulse Rate: 76 (09/18 1205)  Labs: Recent Labs    03/14/18 0936 03/14/18 1232  03/14/18 1618 03/14/18 2359 03/15/18 0324 03/16/18 0230  HGB 14.7  --   --   --   --  13.9 14.2  HCT 42.1  --   --   --   --  38.9* 40.1  PLT 191  --   --   --   --  180 192  APTT 25  --   --   --   --   --   --   LABPROT 13.4  --   --   --   --   --   --   INR 1.03  --   --   --   --   --   --   HEPARINUNFRC  --   --    < > <0.10* 0.54 0.44 0.32  CREATININE 1.00  --   --   --   --   --  0.94  TROPONINI 0.48* 2.52*  --  5.92*  --   --   --    < > = values in this interval not displayed.    Estimated Creatinine Clearance: 83.8 mL/min (by C-G formula based on SCr of 0.94 mg/dL).   Medical History: Past Medical History:  Diagnosis Date  . Tachycardia      Assessment: 55 yoM transferred from [facility] for consideration for PCI reattempt vs CABG consult. Pt started on IV heparin for ACS r/o and new onset AFib. Pt arrives with heparin infusing at 1500 units/hr, previously therapeutic at this rate but was off temporarily for cardiac cath. Will continue this rate and check heparin level this evening.  Goal of Therapy:  Heparin level 0.3-0.7 units/ml Monitor platelets by anticoagulation protocol: Yes   Plan:  -Continue IV heparin 1500 units/hr -Check heparin level at 2100 -Daily heparin level and CBC -F/U plans for cath vs CABG  Arrie Senate, PharmD, BCPS Clinical Pharmacist [phone] Please check AMION for all [place] numbers 03/16/2018

## 2018-03-16 NOTE — Progress Notes (Signed)
ANTICOAGULATION CONSULT NOTE  Pharmacy Consult for heparin Indication: atrial fibrillation  No Known Allergies  Patient Measurements: Height: 6' (182.9 cm) Weight: 210 lb 12.2 oz (95.6 kg) IBW/kg (Calculated) : 77.6 Heparin Dosing Weight: 95kg  Vital Signs: Temp: 99.7 F (37.6 C) (09/18 2004) Temp Source: Oral (09/18 2004) BP: 165/101 (09/18 2000) Pulse Rate: 76 (09/18 2100)  Labs: Recent Labs    03/14/18 0936 03/14/18 1232 03/14/18 1618  03/15/18 0324 03/16/18 0230 03/16/18 2138  HGB 14.7  --   --   --  13.9 14.2  --   HCT 42.1  --   --   --  38.9* 40.1  --   PLT 191  --   --   --  180 192  --   APTT 25  --   --   --   --   --   --   LABPROT 13.4  --   --   --   --   --   --   INR 1.03  --   --   --   --   --   --   HEPARINUNFRC  --   --  <0.10*   < > 0.44 0.32 0.29*  CREATININE 1.00  --   --   --   --  0.94  --   TROPONINI 0.48* 2.52* 5.92*  --   --   --   --    < > = values in this interval not displayed.    Estimated Creatinine Clearance: 83.9 mL/min (by C-G formula based on SCr of 0.94 mg/dL).   Medical History: Past Medical History:  Diagnosis Date  . CAD (coronary artery disease)   . Persistent atrial fibrillation ([place])   . Tachycardia      Assessment: 44 yoM transferred from [facility] for consideration for PCI reattempt vs CABG consult. Pt started on IV heparin for ACS r/o and new onset AFib. Pt arrives with heparin infusing at 1500 units/hr, previously therapeutic at this rate but was off temporarily for cardiac cath.  Heparin level now 0.29 units/ml  Goal of Therapy:  Heparin level 0.3-0.7 units/ml Monitor platelets by anticoagulation protocol: Yes   Plan:  -Increase  IV heparin 1600 units/hr -Daily heparin level and CBC  Thanks for allowing pharmacy to be a part of this patient's care.  Excell Seltzer, PharmD Clinical Pharmacist

## 2018-03-16 NOTE — Consult Note (Signed)
ANTICOAGULATION CONSULT NOTE - Consult  Pharmacy Consult for Heparin drip Indication: chest pain/ACS and atrial fibrillation  No Known Allergies  Patient Measurements: Height: 6' (182.9 cm) Weight: 209 lb 9.6 oz (95.1 kg) IBW/kg (Calculated) : 77.6 Heparin Dosing Weight: 95.1kg  Vital Signs: Temp: 98.3 F (36.8 C) (09/17 2022) Temp Source: Oral (09/17 2022) BP: 138/93 (09/17 2022) Pulse Rate: 75 (09/17 2022)  Labs: Recent Labs    03/14/18 0936 03/14/18 1232  03/14/18 1618 03/14/18 2359 03/15/18 0324 03/16/18 0230  HGB 14.7  --   --   --   --  13.9 14.2  HCT 42.1  --   --   --   --  38.9* 40.1  PLT 191  --   --   --   --  180 192  APTT 25  --   --   --   --   --   --   LABPROT 13.4  --   --   --   --   --   --   INR 1.03  --   --   --   --   --   --   HEPARINUNFRC  --   --    < > <0.10* 0.54 0.44 0.32  CREATININE 1.00  --   --   --   --   --  0.94  TROPONINI 0.48* 2.52*  --  5.92*  --   --   --    < > = values in this interval not displayed.    Estimated Creatinine Clearance: 83.8 mL/min (by C-G formula based on SCr of 0.94 mg/dL).    Assessment: Patient is a 72 year old male who presents with chest pain. Found to be in afib. No anticoagulation PTA. Pharmacy consulted to manage heparin drip. Troponin positive at 0.48.   Heparin Therapy Course:  9/16 Heparin level low at <0.10. I will bolus 2850 units and increase rate to 1500 units/hr. Recheck in 8 hours.  9/16 2359 heparin level 0.54. Continue current regimen. Recheck heparin level and CBC with AM labs.  9/17 0324 heparin level 0.44. Continue current regimen. Recheck heparin level and CBC with tomorrow AM labs.   Goal of Therapy:  Heparin level 0.3-0.7 units/ml Monitor platelets by anticoagulation protocol: Yes   Plan:  Heparin has been off for cardiac cath. Per discussion with Specials RNs who paged Dr Clayborn Bigness, restart heparin drip at previous rate without a bolus at 1830 today. Will order heparin level  for 6h after restart. CBC in AM.   09/18 0230 heparin level 0.32. Continue current regimen. Recheck heparin level and CBC with tomorrow AM labs.  Pharmacy will continue to follow.  Sim Boast, PharmD, BCPS  03/16/18 4:12 AM

## 2018-03-16 NOTE — Progress Notes (Signed)
Patient off the floor to specials via bed at this time .

## 2018-03-16 NOTE — Care Management Note (Signed)
Case Management Note  Patient Details  Name: [name] MRN: [id number] Date of Birth: 13-May-1946  Subjective/Objective:          Transferring to [facility] for stents.       Action/Plan:   Expected Discharge Date:  03/16/18               Expected Discharge Plan:  Acute to Acute Transfer  In-House Referral:     Discharge planning Services     Post Acute Care Choice:    Choice offered to:     DME Arranged:    DME Agency:     HH Arranged:    HH Agency:     Status of Service:  Completed, signed off  If discussed at [employer] of Stay Meetings, dates discussed:    Additional Comments:  Elza Rafter, RN 03/16/2018, 11:42 AM

## 2018-03-16 NOTE — Progress Notes (Signed)
Report called to receiving nurse at [facility] at this time.

## 2018-03-16 NOTE — H&P (Addendum)
Cardiology Admission History and Physical:   Patient ID: David Mccullough MRN: [id number]; DOB: 15-Apr-1946   Admission date: 03/16/2018  Primary Care Provider: Patient, No Pcp Per Primary Cardiologist: No primary care provider on file. [facility])  Chief Complaint:  Chest pain/NSTEMI  Patient Profile:   David Mccullough is a 72 y.o. male with history of ? SVT s/p ablation 10 years ago in [place], newly diagnosed atrial fibrillation and newly diagnosed CAD transferred from [facility] to [facility] today by the [facility] cardiology team to review possibilities for PCI vs CABG.   History of Present Illness:   David Mccullough is a pleasant 72 yo male with history of SVT who was admitted to [facility] 03/14/18 with c/o chet pain. He ruled in for a NSTEMI with peak troponin 5.92. He was also found to be in atrial fibrillation on arrival to [facility]. Cardiac cath 03/15/18 per Dr. Clayborn Bigness and found to have severe three vessel CAD. The option of CABG was discussed but the patient elected to proceed with multi-vessel PCI. He was loaded with Plavix and brought back to the cath lab at [facility] today for PCI. Dr. Clayborn Bigness was attempting PCI of the obtuse marginal branch and lost flow down this vessel when the vessel was engaged with a wire, likely due to sub-intimal tracking of the wire. Balloon angioplasty did not restore flow. He was taken off of the cath table and transferred to [facility]. He is chest pain free on arrival to [facility]. He is in atrial fibrillation with rate in the 70s. He is on a heparin drip. Denies dyspnea. He takes Trazodone at home and multi-vitamins. He notes having hyperlipidemia but has not been on a statin prior to admission.    Past Medical History:  Diagnosis Date  . CAD (coronary artery disease)   . Persistent atrial fibrillation ([place])   . Tachycardia     Past Surgical History:  Procedure Laterality Date  . CARDIAC ELECTROPHYSIOLOGY STUDY AND ABLATION     patient states due to V-tac     Medications Prior to  Admission: Prior to Admission medications   Medication Sig Start Date End Date Taking? Authorizing Provider  aspirin 81 MG chewable tablet Chew 81 mg by mouth at bedtime.     [provider]  atorvastatin (LIPITOR) 40 MG tablet Take 1 tablet (40 mg total) by mouth daily at 6 PM. 03/16/18   Vaughan Basta, MD  Calcium Carb-Cholecalciferol (CALCIUM 1000 + D PO) Take 1 tablet by mouth daily.     [provider]  clopidogrel (PLAVIX) 300 MG TABS tablet Take 2 tablets (600 mg total) by mouth before cath procedure. 03/17/18   Vaughan Basta, MD  clopidogrel (PLAVIX) 75 MG tablet Take 1 tablet (75 mg total) by mouth daily with breakfast. 03/17/18   Vaughan Basta, MD  heparin 100-0.45 UNIT/ML-% infusion Inject 1,500 Units/hr into the vein continuous. 03/16/18   Vaughan Basta, MD  metoprolol tartrate (LOPRESSOR) 25 MG tablet Take 0.5 tablets (12.5 mg total) by mouth 2 (two) times daily. 03/16/18   Vaughan Basta, MD  Multiple Vitamin (MULTIVITAMIN) tablet Take 1 tablet by mouth daily.    [provider]  nitroGLYCERIN (NITROSTAT) 0.4 MG SL tablet Place 1 tablet (0.4 mg total) under the tongue every 5 (five) minutes x 3 doses as needed for chest pain. 03/16/18   Vaughan Basta, MD  Omega-3 Fatty Acids (FISH OIL) 1200 MG CAPS Take 1 capsule by mouth daily.     [provider]  traZODone (DESYREL) 50  MG tablet Take 50 mg by mouth at bedtime.     [provider]     Allergies:   No Known Allergies  Social History:   Social History   Socioeconomic History  . Marital status: Unknown    Spouse name: Not on file  . Number of children: Not on file  . Years of education: Not on file  . Highest education level: Not on file  Occupational History  . Not on file  Social Needs  . Financial resource strain: Not on file  . Food insecurity:    Worry: Not on file    Inability: Not on file  . Transportation needs:     Medical: Not on file    Non-medical: Not on file  Tobacco Use  . Smoking status: Former Smoker    Packs/day: 1.00    Years: 25.00    Pack years: 25.00    Types: Cigarettes    Last attempt to quit: 06/30/1987    Years since quitting: 30.7  . Smokeless tobacco: Never Used  Substance and Sexual Activity  . Alcohol use: Not Currently  . Drug use: Not Currently  . Sexual activity: Not on file  Lifestyle  . Physical activity:    Days per week: Not on file    Minutes per session: Not on file  . Stress: Not on file  Relationships  . Social connections:    Talks on phone: Not on file    Gets together: Not on file    Attends religious service: Not on file    Active member of club or organization: Not on file    Attends meetings of clubs or organizations: Not on file    Relationship status: Not on file  . Intimate partner violence:    Fear of current or ex partner: Not on file    Emotionally abused: Not on file    Physically abused: Not on file    Forced sexual activity: Not on file  Other Topics Concern  . Not on file  Social History Narrative  . Not on file    Family History:   The patient's family history includes Arrhythmia in his sister; Heart disease in his father and mother.    ROS:  Please see the history of present illness.  All other ROS reviewed and negative.     Physical Exam/Data:   Vitals:   03/16/18 1300  BP: 125/82  Temp: 98.1 F (36.7 C)  TempSrc: Oral    Intake/Output Summary (Last 24 hours) at 03/16/2018 1345 Last data filed at 03/16/2018 1332 Gross per 24 hour  Intake 3 ml  Output -  Net 3 ml   There were no vitals filed for this visit. There is no height or weight on file to calculate BMI.  General:  Well nourished, well developed, in no acute distress HEENT: normal Lymph: no adenopathy Neck: no JVD Endocrine:  No thryomegaly Vascular: No carotid bruits; FA pulses 2+ bilaterally without bruits  Cardiac:  normal S1, S2; RRR; no murmur    Lungs:  clear to auscultation bilaterally, no wheezing, rhonchi or rales  Abd: soft, nontender, no hepatomegaly  Ext: no LE edema Musculoskeletal:  No deformities, BUE and BLE strength normal and equal Skin: warm and dry  Neuro:  CNs 2-12 intact, no focal abnormalities noted Psych:  Normal affect    EKG:  pending  Relevant CV Studies:  Cardiac cath 03/15/18:  Prox LAD lesion is 80% stenosed.  1st Mrg lesion  is 99% stenosed.  Prox RCA lesion is 100% stenosed.  Diagnostic Diagram        Laboratory Data:  Chemistry Recent Labs  Lab 03/14/18 0936 03/16/18 0230  NA 138 138  K 4.0 4.0  CL 106 106  CO2 25 25  GLUCOSE 112* 112*  BUN 25* 24*  CREATININE 1.00 0.94  CALCIUM 9.2 9.1  GFRNONAA >60 >60  GFRAA >60 >60  ANIONGAP 7 7    No results for input(s): PROT, ALBUMIN, AST, ALT, ALKPHOS, BILITOT in the last 168 hours. Hematology Recent Labs  Lab 03/15/18 0324 03/16/18 0230  WBC 9.2 8.9  RBC 4.55 4.68  HGB 13.9 14.2  HCT 38.9* 40.1  MCV 85.4 85.7  MCH 30.5 30.3  MCHC 35.7 35.4  RDW 13.4 13.6  PLT 180 192   Cardiac Enzymes Recent Labs  Lab 03/14/18 0936 03/14/18 1232 03/14/18 1618  TROPONINI 0.48* 2.52* 5.92*   No results for input(s): TROPIPOC in the last 168 hours.  BNP Recent Labs  Lab 03/14/18 0936  BNP 180.0*    DDimer No results for input(s): DDIMER in the last 168 hours.  Radiology/Studies:  No results found.  Assessment and Plan:    1. CAD/NSTEMI: Pt admitted to [facility] with a NSTEMI on 03/14/18. Cardiac cath on 03/15/18 with three vessel CAD. I have personally reviewed the cath films. The RCA is chronically occluded and fills from small bridging right to right collaterals. This vessel does not appear favorable for CTO PCI. The obtuse marginal branch was lost during attempted PCI at [facility] this am. The LAD has moderate ostial stenosis and a long segment of severe stenosis in the mid vessel.  I have reviewed the films with my IC colleagues and  it seems reasonable to consider CABG in this functional, 71 yo patient. It is likely that the OM branch and the RCA could not be adequately treated with PCI.  He is having no chest pain.  Will admit to the ICU.  Continue ASA, beta blocker and statin.  Stop Plavix. Will need Plavix washout prior to surgery.  Continue IV heparin.  Will arrange an EKG now and an echo  Will ask CT surgery to see him to discuss CABG.   2. Atrial fibrillation: Rate is controlled. Continue heparin for now. Continue beta blocker.   Severity of Illness: The appropriate patient status for this patient is INPATIENT. Inpatient status is judged to be reasonable and necessary in order to provide the required intensity of service to ensure the patient's safety. The patient's presenting symptoms, physical exam findings, and initial radiographic and laboratory data in the context of their chronic comorbidities is felt to place them at high risk for further clinical deterioration. Furthermore, it is not anticipated that the patient will be medically stable for discharge from the hospital within 2 midnights of admission. The following factors support the patient status of inpatient.   " The patient's presenting symptoms include chest pain. " The worrisome physical exam findings include severe CAD on cath " The initial radiographic and laboratory data are worrisome because of elevated troponin " The chronic co-morbidities include advanced age, severe CAD, atrial fibrillation   * I certify that at the point of admission it is my clinical judgment that the patient will require inpatient hospital care spanning beyond 2 midnights from the point of admission due to high intensity of service, high risk for further deterioration and high frequency of surveillance required.*    For questions or updates, please  contact [place] Please consult [email or web address] for contact info under        Signed, Lauree Chandler, MD    03/16/2018 1:45 PM

## 2018-03-16 NOTE — Consult Note (Addendum)
[address]       [place] [address]             [phone]        David Mccullough [facility] Medical Record #[id number] Date of Birth: [birth year]  Referring: [place] Primary Care: Patient, No Pcp Per Primary Cardiologist:No primary care provider on file.  Chief Complaint:   CAD  History of Present Illness:      David Mccullough is a 72 yo white male with known history of SVT, S/P Ablation performed in 2007.  He has been under a great deal of stress lately due to recently moving to [place], difficulties with his adult son, and the sale of his house falling through.  He has had thoughts of suicide and harm to other people in the remote past.  However these have resolved and he has been evaluated by a psychiatrist in the remote past.  He presented to urgent care 9/16 with complaints of chest pain.  He states he noticed this the day prior while exercising on a treadmill.  He stopped when pain developed and it resolved after 5-10 min of rest.  It developed again the morning of presentation and had lasted about 2-3 hours.  This was associated with sweating.  EKG was obtained and showed Atrial Fibrillation with q waves present in leads II and III.  He was given 4 baby ASA and sent to the ED.  Upon arrival to the ED the patient's chest pain had resolved.  Troponin level was obtained and was elevated at 0.4.  He was started on Heparin and admitted for further care.  Cardiology consult was obtained and they recommended cardiac catheterization.  This was performed on 9/17 and showed preserved EF and multivessel CAD.  It was felt coronary bypass grafting vs PCI would be indicated.  Patient opted to proceed with PCI which was attempted today 9/18 and was ultimately unsuccessful with loss of flow to the OM.  The patient was transferred to [facility] for further care and bypass surgery consult.  Upon arrival the patient was chest pain free.  His catheterization films were  reviewed by Dr. Angelena Form who felt bypass would be indicated.  He remained on Heparin drip and was admitted to the ICU for further care.  Currently the patient is experiencing mild chest discomfort which he rates 2/10.  He was given NTG with relief of symptoms.  He states he is normally very active.  He continued to work part time until his recent move to [place].  He had not yet located employment since relocation here.  He is a previous smoker of 1 ppd, quitting approximately 30 years ago.  He has been treated by psychiatry, but feels like medications did not work for him.  He denies history of DM and significant family history of premature CAD.  He does not like the idea of having bypass surgery, but is willing to proceed as it is the best treatment option.     Current Activity/ Functional Status: Patient is independent with mobility/ambulation, transfers, ADL's, IADL's.   Zubrod Score: At the time of surgery this patient's most appropriate activity status/level should be described as: []     0    Normal activity, no symptoms [x]     1    Restricted in physical strenuous activity but ambulatory, able to do out light work []     2    Ambulatory and capable of self care, unable to  do work activities, up and about                 more than 50%  Of the time                            []     3    Only limited self care, in bed greater than 50% of waking hours []     4    Completely disabled, no self care, confined to bed or chair []     5    Moribund  Past Medical History:  Diagnosis Date  . CAD (coronary artery disease)   . Persistent atrial fibrillation ([place])   . Tachycardia     Past Surgical History:  Procedure Laterality Date  . CARDIAC ELECTROPHYSIOLOGY STUDY AND ABLATION     patient states due to V-tac  . CORONARY STENT INTERVENTION N/A 03/16/2018   Procedure: CORONARY STENT INTERVENTION;  Surgeon: Yolonda Kida, MD;  Location: [facility];  Service: Cardiovascular;  Laterality:  N/A;  . LEFT HEART CATH AND CORONARY ANGIOGRAPHY N/A 03/15/2018   Procedure: LEFT HEART CATH AND CORONARY ANGIOGRAPHY and PCI stent;  Surgeon: Yolonda Kida, MD;  Location: [facility];  Service: Cardiovascular;  Laterality: N/A;    Social History   Tobacco Use  Smoking Status Former Smoker  . Packs/day: 1.00  . Years: 25.00  . Pack years: 25.00  . Types: Cigarettes  . Last attempt to quit: 06/30/1987  . Years since quitting: 30.7  Smokeless Tobacco Never Used    Social History   Substance and Sexual Activity  Alcohol Use Not Currently     No Known Allergies  Current Facility-Administered Medications  Medication Dose Route Frequency Provider Last Rate Last Dose  . 0.9 %  sodium chloride infusion  250 mL Intravenous PRN Bhagat, Bhavinkumar, PA      . 0.9% sodium chloride infusion  1 mL/kg/hr Intravenous Continuous Bhagat, Bhavinkumar, PA      . acetaminophen (TYLENOL) tablet 650 mg  650 mg Oral Q4H PRN Bhagat, Bhavinkumar, PA      . [START ON 03/17/2018] aspirin EC tablet 81 mg  81 mg Oral Daily Bhagat, Bhavinkumar, PA      . atorvastatin (LIPITOR) tablet 80 mg  80 mg Oral q1800 Bhagat, Bhavinkumar, PA      . heparin ADULT infusion 100 units/mL (25000 units/256mL sodium chloride 0.45%)  1,500 Units/hr Intravenous Continuous Einar Grad, RPH 15 mL/hr at 03/16/18 1500 1,500 Units/hr at 03/16/18 1500  . metoprolol tartrate (LOPRESSOR) tablet 12.5 mg  12.5 mg Oral BID [place], [place], PA      . [START ON 03/17/2018] multivitamin with minerals tablet 1 tablet  1 tablet Oral Daily Bhagat, Bhavinkumar, PA      . nitroGLYCERIN (NITROSTAT) SL tablet 0.4 mg  0.4 mg Sublingual Q5 Min x 3 PRN Bhagat, Bhavinkumar, PA   0.4 mg at 03/16/18 1515  . ondansetron (ZOFRAN) injection 4 mg  4 mg Intravenous Q6H PRN Bhagat, Bhavinkumar, PA      . sodium chloride flush (NS) 0.9 % injection 3 mL  3 mL Intravenous Q12H Bhagat, Bhavinkumar, PA   3 mL at 03/16/18 1332  . sodium chloride  flush (NS) 0.9 % injection 3 mL  3 mL Intravenous PRN Bhagat, Bhavinkumar, PA      . traZODone (DESYREL) tablet 50 mg  50 mg Oral QHS Bhagat, [place], PA  Medications Prior to Admission  Medication Sig Dispense Refill Last Dose  . aspirin 81 MG chewable tablet Chew 81 mg by mouth at bedtime.      Marland Kitchen atorvastatin (LIPITOR) 40 MG tablet Take 1 tablet (40 mg total) by mouth daily at 6 PM. 30 tablet 0   . Calcium Carb-Cholecalciferol (CALCIUM 1000 + D PO) Take 1 tablet by mouth daily.      Derrill Memo ON 03/17/2018] clopidogrel (PLAVIX) 300 MG TABS tablet Take 2 tablets (600 mg total) by mouth before cath procedure. 20 tablet 0   . [START ON 03/17/2018] clopidogrel (PLAVIX) 75 MG tablet Take 1 tablet (75 mg total) by mouth daily with breakfast. 20 tablet 0   . heparin 100-0.45 UNIT/ML-% infusion Inject 1,500 Units/hr into the vein continuous. 250 mL 0   . metoprolol tartrate (LOPRESSOR) 25 MG tablet Take 0.5 tablets (12.5 mg total) by mouth 2 (two) times daily. 60 tablet 0   . Multiple Vitamin (MULTIVITAMIN) tablet Take 1 tablet by mouth daily.     . nitroGLYCERIN (NITROSTAT) 0.4 MG SL tablet Place 1 tablet (0.4 mg total) under the tongue every 5 (five) minutes x 3 doses as needed for chest pain. 20 tablet 12   . Omega-3 Fatty Acids (FISH OIL) 1200 MG CAPS Take 1 capsule by mouth daily.      . traZODone (DESYREL) 50 MG tablet Take 50 mg by mouth at bedtime.    03/13/2018 at Unknown time    Family History  Problem Relation Age of Onset  . Heart disease Mother   . Heart disease Father   . Arrhythmia Sister      Review of Systems:   ROS Constitutional: negative Respiratory: negative Cardiovascular: positive for chest pain Gastrointestinal: negative Integument/breast: negative Neurological: negative Behavioral/Psych: positive for depression and suicidal thoughts, harming others in remote past     Cardiac Review of Systems: Y or  [    ]= no  Chest Pain [  Y  ]  Resting SOB [ N   ] Exertional SOB  [N  ]  Orthopnea [  ]   Pedal Edema [   ]    Palpitations [ Y ] Syncope  [  ]   Presyncope [   ]  General Review of Systems: [Y] = yes [  ]=no Constitional: recent weight change Aqua.Slicker  ]; anorexia [  ]; fatigue Aqua.Slicker ]; nausea [  ]; night sweats Aqua.Slicker  ]; fever [  ]; or chills [  ]                                                               Dental: Last Dentist visit:   Eye : blurred vision [  ]; diplopia [   ]; vision changes Aqua.Slicker  ];  Amaurosis fugax[  ]; Resp: cough Aqua.Slicker  ];  wheezing[  ];  hemoptysis[  ]; shortness of breath[N  ]; paroxysmal nocturnal dyspnea[  ]; dyspnea on exertion[  ]; or orthopnea[  ];  GI:  gallstones[  ], vomiting[ N ];  dysphagia[  ]; melena[  ];  hematochezia [  ]; heartburn[ Y ];   Hx of  Colonoscopy[  ]; GU: kidney stones [  ]; hematuria[  ];   dysuria [  ];  nocturia[  ];  history of     obstruction [  ]; urinary frequency [  ]             Skin: rash, swelling[ N ];, hair loss[  ];  peripheral edema[N  ];  or itching[  ]; Musculosketetal: myalgias[  ];  joint swelling[  ];  joint erythema[  ];  joint pain[  ];  back pain[ N ];  Heme/Lymph: bruising[  ];  bleeding[  ];  anemia[  ];  Neuro: TIA[  ];  headaches[  ];  stroke[ N ];  vertigo[  ];  seizures[  ];   paresthesias[  ];  difficulty walking[ N ];  Psych:depression[ Y ]; anxiety[ Y ];  Endocrine: diabetes[ N ];  thyroid dysfunction[N  ];            last dental visit about 6 months ago       Physical Exam: BP (!) 108/95   Pulse 89   Temp 98.1 F (36.7 C) (Oral)   Resp 20   SpO2 99%   General appearance: alert, cooperative and no distress Head: Normocephalic, without obvious abnormality, atraumatic Neck: no adenopathy, no carotid bruit, no JVD, supple, symmetrical, trachea midline and thyroid not enlarged, symmetric, no tenderness/mass/nodules Resp: clear to auscultation bilaterally Cardio: regular rate and rhythm GI: soft, non-tender; bowel sounds normal; no masses,  no  organomegaly Extremities: extremities normal, atraumatic, no cyanosis or edema Neurologic: Grossly normal  Diagnostic Studies & Laboratory data:     Recent Radiology Findings:   No results found.   I have independently reviewed the above radiologic studies and discussed with the patient   Recent Lab Findings: Lab Results  Component Value Date   WBC 8.9 03/16/2018   HGB 14.2 03/16/2018   HCT 40.1 03/16/2018   PLT 192 03/16/2018   GLUCOSE 112 (H) 03/16/2018   CHOL 175 03/15/2018   TRIG 177 (H) 03/15/2018   HDL 30 (L) 03/15/2018   LDLCALC 110 (H) 03/15/2018   NA 138 03/16/2018   K 4.0 03/16/2018   CL 106 03/16/2018   CREATININE 0.94 03/16/2018   BUN 24 (H) 03/16/2018   CO2 25 03/16/2018   TSH 2.289 03/14/2018   INR 1.03 03/14/2018    Assessment / Plan:      1. CAD- needs bypass, was loaded with Plavix prior attempted PCI--- on Heparin drip, having mild chest discomfort which resolved with SL NTG 2. H/O Suicidal Thoughts- resolved, patient under a lot of stress currently, has been evaluated by psychiatry about 1 week ago 3. Atrial Fibrillation- new onset, currently rate controlled 4. Dispo-patient with mild chest discomfort, resolved with NTG, currently on Heparin drip. Surgery is tentatively planned for Tuesday, to allow for Plavix washout.  Dr. Prescott Gum to follow up with further recommendations  I  spent 60 minutes counseling the patient face to face.   @ME1 @ 03/16/2018 3:40 PM  Patient examined, coronary angiogram images personally reviewed and counseled with patient Patient stable after attempted PCI of OM yesterday.  He would benefit from bypass grafts to the chronically occluded RCA, the OM and the LAD.  Patient had 600 mg load of Plavix yesterday and will be scheduled for surgery the first of next week.  I discussed the procedure of CABG with the patient and he agrees to proceed.  Continue hospital monitoring and IV heparin until surgery. David Byes MD

## 2018-03-17 ENCOUNTER — Inpatient Hospital Stay (HOSPITAL_COMMUNITY): Payer: Medicare Other

## 2018-03-17 ENCOUNTER — Other Ambulatory Visit: Payer: Self-pay

## 2018-03-17 ENCOUNTER — Encounter (HOSPITAL_COMMUNITY): Payer: Self-pay

## 2018-03-17 DIAGNOSIS — I340 Nonrheumatic mitral (valve) insufficiency: Secondary | ICD-10-CM

## 2018-03-17 DIAGNOSIS — Z01810 Encounter for preprocedural cardiovascular examination: Secondary | ICD-10-CM

## 2018-03-17 LAB — LIPID PANEL
Cholesterol: 158 mg/dL (ref 0–200)
HDL: 36 mg/dL — ABNORMAL LOW (ref >40)
LDL Cholesterol: 100 mg/dL — ABNORMAL HIGH (ref 0–99)
Total CHOL/HDL Ratio: 4.4 RATIO
Triglycerides: 111 mg/dL (ref <150)
VLDL: 22 mg/dL (ref 0–40)

## 2018-03-17 LAB — CBC
HCT: 41.8 % (ref 39.0–52.0)
Hemoglobin: 14.1 g/dL (ref 13.0–17.0)
MCH: 29 pg (ref 26.0–34.0)
MCHC: 33.7 g/dL (ref 30.0–36.0)
MCV: 86 fL (ref 78.0–100.0)
PLATELETS: 195 10*3/uL (ref 150–400)
RBC: 4.86 MIL/uL (ref 4.22–5.81)
RDW: 12.6 % (ref 11.5–15.5)
WBC: 10.6 10*3/uL — ABNORMAL HIGH (ref 4.0–10.5)

## 2018-03-17 LAB — URINALYSIS, ROUTINE W REFLEX MICROSCOPIC
BILIRUBIN URINE: NEGATIVE
Bacteria, UA: NONE SEEN
Bacteria, UA: NONE SEEN
Bilirubin Urine: NEGATIVE
GLUCOSE, UA: NEGATIVE mg/dL
Glucose, UA: NEGATIVE mg/dL
KETONES UR: NEGATIVE mg/dL
Ketones, ur: 5 mg/dL — AB
LEUKOCYTES UA: NEGATIVE
Leukocytes, UA: NEGATIVE
NITRITE: NEGATIVE
Nitrite: NEGATIVE
PROTEIN: NEGATIVE mg/dL
Protein, ur: NEGATIVE mg/dL
Specific Gravity, Urine: 1.013 (ref 1.005–1.030)
Specific Gravity, Urine: 1.012 (ref 1.005–1.030)
pH: 6 (ref 5.0–8.0)
pH: 7 (ref 5.0–8.0)

## 2018-03-17 LAB — BASIC METABOLIC PANEL
ANION GAP: 8 (ref 5–15)
BUN: 13 mg/dL (ref 8–23)
CALCIUM: 9.2 mg/dL (ref 8.9–10.3)
CO2: 24 mmol/L (ref 22–32)
Chloride: 105 mmol/L (ref 98–111)
Creatinine, Ser: 1.05 mg/dL (ref 0.61–1.24)
GFR calc Af Amer: >60 mL/min (ref >60)
GLUCOSE: 120 mg/dL — AB (ref 70–99)
Potassium: 3.7 mmol/L (ref 3.5–5.1)
SODIUM: 137 mmol/L (ref 135–145)

## 2018-03-17 LAB — SURGICAL PCR SCREEN
MRSA, PCR: NEGATIVE
Staphylococcus aureus: POSITIVE — AB

## 2018-03-17 LAB — HEPATIC FUNCTION PANEL
ALT: 33 U/L (ref 0–44)
AST: 125 U/L — ABNORMAL HIGH (ref 15–41)
Albumin: 3.5 g/dL (ref 3.5–5.0)
Alkaline Phosphatase: 47 U/L (ref 38–126)
Bilirubin, Direct: 0.2 mg/dL (ref 0.0–0.2)
Indirect Bilirubin: 1 mg/dL — ABNORMAL HIGH (ref 0.3–0.9)
Total Bilirubin: 1.2 mg/dL (ref 0.3–1.2)
Total Protein: 6.2 g/dL — ABNORMAL LOW (ref 6.5–8.1)

## 2018-03-17 LAB — HEMOGLOBIN A1C
Hgb A1c MFr Bld: 5.2 % (ref 4.8–5.6)
Mean Plasma Glucose: 102.54 mg/dL

## 2018-03-17 LAB — TSH: TSH: 1.977 u[IU]/mL (ref 0.350–4.500)

## 2018-03-17 LAB — PROTIME-INR
INR: 1.1
Prothrombin Time: 14.1 seconds (ref 11.4–15.2)

## 2018-03-17 LAB — ECHOCARDIOGRAM COMPLETE
Height: 72 in
Weight: 3326.3 oz

## 2018-03-17 LAB — HEPARIN LEVEL (UNFRACTIONATED): HEPARIN UNFRACTIONATED: 0.31 [IU]/mL (ref 0.30–0.70)

## 2018-03-17 MED ORDER — SODIUM CHLORIDE 0.9 % IV SOLN
2.0000 g | Freq: Three times a day (TID) | INTRAVENOUS | Status: AC
  Administered 2018-03-17 – 2018-03-21 (×13): 2 g via INTRAVENOUS
  Filled 2018-03-17 (×14): qty 2

## 2018-03-17 MED ORDER — MUPIROCIN CALCIUM 2 % EX CREA
TOPICAL_CREAM | Freq: Two times a day (BID) | CUTANEOUS | Status: DC
  Administered 2018-03-17: 22:00:00 via TOPICAL
  Administered 2018-03-17: 1 via TOPICAL
  Administered 2018-03-18 – 2018-03-27 (×19): via TOPICAL
  Administered 2018-03-28 (×2): 1 via TOPICAL
  Administered 2018-03-29: 09:00:00 via TOPICAL
  Administered 2018-03-29: 1 via TOPICAL
  Filled 2018-03-17 (×4): qty 15

## 2018-03-17 MED ORDER — METOPROLOL TARTRATE 25 MG PO TABS
25.0000 mg | ORAL_TABLET | Freq: Two times a day (BID) | ORAL | Status: DC
  Administered 2018-03-17 (×2): 25 mg via ORAL
  Filled 2018-03-17 (×2): qty 1

## 2018-03-17 NOTE — Progress Notes (Signed)
Pre-op Cardiac Surgery  Carotid Findings:  Bilateral ICA: 1-39% stenosis   Upper Extremity Right Left  Brachial Pressures 142 139  Radial Waveforms triphasic Triphasic   Ulnar Waveforms triphasic triphasic  Palmar Arch (Allen's Test) Radial and ulnar: WNL Radial and ulnar: WNL       Lower  Extremity Right Left  Dorsalis Pedis triphasic triphasic      Posterior Tibial triphasic triphasic        Abram Sander 03/17/2018 3:43 PM

## 2018-03-17 NOTE — Progress Notes (Addendum)
   Notified by RN that patient had increased HR with ambulation this afternoon, up to 140s. Also with increased work of breathing, although patient denies change and reports SOB is at baseline. At 1845pm he had a temperature of 101.3. CXR from earlier in the day with patchy consolidation or the rigth mid lung suspicious for infection, new from CXR 03/14/18. Will obtain UA, UCx, and BCx prior to starting antibiotics to complete fever work-up. Given fever, worsening SOB, and CXR findings, will start cefepime 2g IV q8h for HCAP.   Abigail Butts, PA-C

## 2018-03-17 NOTE — Progress Notes (Signed)
Patient received via wheelchair from [place] in stable condition, VSS, afib w controlled rate and no c/o pain, dyspnea. Oriented he and wife to unit and room. Telemetry applied and CCMD notified.

## 2018-03-17 NOTE — Progress Notes (Signed)
1325-1400 Gave pt OHS booklet, care guide and in the tube handout. Wrote down how to view pre op video. Pt stated he does not think he will watch it. Discussed importance of mobility and IS after surgery. Pt given IS and he demonstrated 1250 ml correctly. Discussed staying in the tube and sternal restrictions. Wife will be available after discharge to assist with care. Pt stated he walked three times around unit without CP (1110 ft). Will continue to follow pt. Graylon Good RN BSN 03/17/2018 1:57 PM

## 2018-03-17 NOTE — Progress Notes (Signed)
Echocardiogram 2D Echocardiogram has been performed.  David Mccullough 03/17/2018, 11:37 AM

## 2018-03-17 NOTE — Progress Notes (Signed)
HR to 140s when up in room and 115s when at rest in afib. Patient denies dyspnea however had pursed lip breathing when up walking. Stated he felt better once he sat down and rested then agreed he was SOB and this was normal for him lately. He has temp of 101.3 but denies any other symptoms. Daleen Snook Kroeger notified.

## 2018-03-17 NOTE — Progress Notes (Signed)
Progress Note  Patient Name: David Mccullough Date of Encounter: 03/17/2018  Primary Cardiologist: No primary care provider on file. [facility])  Subjective   No chest pain. No dyspnea.   Inpatient Medications    Scheduled Meds: . aspirin EC  81 mg Oral Daily  . atorvastatin  80 mg Oral q1800  . metoprolol tartrate  12.5 mg Oral BID  . multivitamin with minerals  1 tablet Oral Daily  . sodium chloride flush  3 mL Intravenous Q12H  . traZODone  50 mg Oral QHS   Continuous Infusions: . sodium chloride    . heparin 1,600 Units/hr (03/17/18 0400)   PRN Meds: sodium chloride, acetaminophen, nitroGLYCERIN, ondansetron (ZOFRAN) IV, sodium chloride flush   Vital Signs    Vitals:   03/17/18 0300 03/17/18 0400 03/17/18 0500 03/17/18 0600  BP:    (!) 120/100  Pulse: 83 100 91 98  Resp:      Temp:      TempSrc:      SpO2: 97% 97% 96% 98%  Weight:   94.3 kg   Height:        Intake/Output Summary (Last 24 hours) at 03/17/2018 0715 Last data filed at 03/17/2018 0400 Gross per 24 hour  Intake 232.94 ml  Output 1650 ml  Net -1417.06 ml   Filed Weights   03/16/18 1831 03/17/18 0500  Weight: 95.6 kg 94.3 kg    Telemetry    Atrial fib - Personally Reviewed  ECG    No AM EKG - Personally Reviewed  Physical Exam   GEN: No acute distress.   Neck: No JVD Cardiac: irreg, irreg. No murmurs, rubs, or gallops.  Respiratory: Clear to auscultation bilaterally. GI: Soft, nontender, non-distended  MS: No edema; No deformity. Neuro:  Nonfocal  Psych: Normal affect   Labs    Chemistry Recent Labs  Lab 03/14/18 0936 03/16/18 0230 03/17/18 0446  NA 138 138 137  K 4.0 4.0 3.7  CL 106 106 105  CO2 25 25 24   GLUCOSE 112* 112* 120*  BUN 25* 24* 13  CREATININE 1.00 0.94 1.05  CALCIUM 9.2 9.1 9.2  PROT  --   --  6.2*  ALBUMIN  --   --  3.5  AST  --   --  125*  ALT  --   --  33  ALKPHOS  --   --  47  BILITOT  --   --  1.2  GFRNONAA >60 >60 >60  GFRAA >60 >60 >60   ANIONGAP 7 7 8      Hematology Recent Labs  Lab 03/15/18 0324 03/16/18 0230 03/17/18 0446  WBC 9.2 8.9 10.6*  RBC 4.55 4.68 4.86  HGB 13.9 14.2 14.1  HCT 38.9* 40.1 41.8  MCV 85.4 85.7 86.0  MCH 30.5 30.3 29.0  MCHC 35.7 35.4 33.7  RDW 13.4 13.6 12.6  PLT 180 192 195    Cardiac Enzymes Recent Labs  Lab 03/14/18 0936 03/14/18 1232 03/14/18 1618  TROPONINI 0.48* 2.52* 5.92*   No results for input(s): TROPIPOC in the last 168 hours.   BNP Recent Labs  Lab 03/14/18 0936  BNP 180.0*     DDimer No results for input(s): DDIMER in the last 168 hours.   Radiology    No results found.  Cardiac Studies   Cardiac cath 03/15/18: Cardiac cath 03/15/18:  Prox LAD lesion is 80% stenosed.  1st Mrg lesion is 99% stenosed.  Prox RCA lesion is 100% stenosed.  Diagnostic Diagram  Patient Profile     72 y.o. male with history of SVT s/p ablation 10 years ago in [place], newly diagnosed atrial fibrillation and newly diagnosed CAD transferred from [facility] to [facility] 03/16/18 by the [facility] cardiology team to review possibilities for PCI vs CABG. Failed PCI at [facility] on 03/16/18.   Assessment & Plan    1. CAD/NSTEMI: He was found to have three vessel CAD by cath at [facility] on 03/15/18. Attempted PCI of OM on 03/16/18 but unsuccessful. Chronic occlusion proximal RCA and severe disease in the LAD. He will need CABG. He has been seen by CT surgery and plans for CABG next week. He was loaded with Plavix so will need Plavix washout before CABG. He has no chest pain this am. Will continue ASA, statin and beta blocker. Continue IV heparin until he has surgery. Echo today  2. Atrial fibrillation, persistent: Rate controlled this am. Continue beta blocker and heparin. Will increase Lopressor to 25 mg po BID.   For questions or updates, please contact [facility] Please consult [email or web address] for contact info under        Signed, Lauree Chandler, MD  03/17/2018, 7:15 AM

## 2018-03-17 NOTE — Progress Notes (Signed)
ANTICOAGULATION CONSULT NOTE  Pharmacy Consult for heparin Indication: atrial fibrillation  No Known Allergies  Patient Measurements:  Height: 6' (182.9 cm) Weight: 207 lb 14.3 oz (94.3 kg) IBW/kg (Calculated) : 77.6 Heparin Dosing Weight: 95kg  Vital Signs: Temp: 97.6 F (36.4 C) (09/19 0752) Temp Source: Oral (09/19 0752) BP: 121/73 (09/19 0800) Pulse Rate: 119 (09/19 0800)  Labs: Recent Labs    03/14/18 0936 03/14/18 1232 03/14/18 1618  03/15/18 0324 03/16/18 0230 03/16/18 2138 03/17/18 0446  HGB 14.7  --   --   --  13.9 14.2  --  14.1  HCT 42.1  --   --   --  38.9* 40.1  --  41.8  PLT 191  --   --   --  180 192  --  195  APTT 25  --   --   --   --   --   --   --   LABPROT 13.4  --   --   --   --   --   --  14.1  INR 1.03  --   --   --   --   --   --  1.10  HEPARINUNFRC  --   --  <0.10*   < > 0.44 0.32 0.29* 0.31  CREATININE 1.00  --   --   --   --  0.94  --  1.05  TROPONINI 0.48* 2.52* 5.92*  --   --   --   --   --    < > = values in this interval not displayed.    Estimated Creatinine Clearance: 74.7 mL/min (by C-G formula based on SCr of 1.05 mg/dL).   Medical History: Past Medical History:  Diagnosis Date  . CAD (coronary artery disease)   . Persistent atrial fibrillation ([place])   . Tachycardia      Assessment: 67 yoM transferred from [facility] now awaiting CABG following Plavix washout. Pt started 9/16 on IV heparin for ACS r/o and new onset AFib. Pt [facility] 9/18 with heparin infusing at 1500 units/hr, previously therapeutic at this rate but was off temporarily for cardiac cath. No issues with infusion per RN. Platelets and HgB are stable. Heparin level now therapeutic at 0.31 units/ml  Goal of Therapy:  Heparin level 0.3-0.7 units/ml Monitor platelets by anticoagulation protocol: Yes   Plan:  -Continue IV heparin 1600 units/hr -Daily heparin level and CBC  Thanks for allowing pharmacy to be a part of this patient's care.  Willia Craze,  Pharmacy Student

## 2018-03-18 ENCOUNTER — Inpatient Hospital Stay (HOSPITAL_COMMUNITY): Payer: Medicare Other

## 2018-03-18 ENCOUNTER — Encounter (HOSPITAL_COMMUNITY): Payer: Self-pay | Admitting: *Deleted

## 2018-03-18 DIAGNOSIS — J189 Pneumonia, unspecified organism: Secondary | ICD-10-CM

## 2018-03-18 LAB — BASIC METABOLIC PANEL
Anion gap: 9 (ref 5–15)
BUN: 15 mg/dL (ref 8–23)
CHLORIDE: 104 mmol/L (ref 98–111)
CO2: 23 mmol/L (ref 22–32)
CREATININE: 1.05 mg/dL (ref 0.61–1.24)
Calcium: 9.2 mg/dL (ref 8.9–10.3)
GFR calc non Af Amer: >60 mL/min (ref >60)
Glucose, Bld: 119 mg/dL — ABNORMAL HIGH (ref 70–99)
Potassium: 3.8 mmol/L (ref 3.5–5.1)
Sodium: 136 mmol/L (ref 135–145)

## 2018-03-18 LAB — PULMONARY FUNCTION TEST
FEF 25-75 Pre: 1.24 L/sec
FEF2575-%Pred-Pre: 49 %
FEV1-%Pred-Pre: 58 %
FEV1-Pre: 1.99 L
FEV1FVC-%Pred-Pre: 97 %
FEV6-%Pred-Pre: 64 %
FEV6-Pre: 2.8 L
FEV6FVC-%Pred-Pre: 106 %
FVC-%Pred-Pre: 60 %
FVC-Pre: 2.8 L
Pre FEV1/FVC ratio: 71 %
Pre FEV6/FVC Ratio: 100 %

## 2018-03-18 LAB — CBC
HCT: 38.2 % — ABNORMAL LOW (ref 39.0–52.0)
Hemoglobin: 13.2 g/dL (ref 13.0–17.0)
MCH: 29.3 pg (ref 26.0–34.0)
MCHC: 34.6 g/dL (ref 30.0–36.0)
MCV: 84.9 fL (ref 78.0–100.0)
Platelets: 168 10*3/uL (ref 150–400)
RBC: 4.5 MIL/uL (ref 4.22–5.81)
RDW: 12.6 % (ref 11.5–15.5)
WBC: 11.1 10*3/uL — AB (ref 4.0–10.5)

## 2018-03-18 LAB — HEPARIN LEVEL (UNFRACTIONATED)
HEPARIN UNFRACTIONATED: 0.2 [IU]/mL — AB (ref 0.30–0.70)
HEPARIN UNFRACTIONATED: 0.32 [IU]/mL (ref 0.30–0.70)
Heparin Unfractionated: 0.27 IU/mL — ABNORMAL LOW (ref 0.30–0.70)

## 2018-03-18 MED ORDER — METOPROLOL TARTRATE 50 MG PO TABS
50.0000 mg | ORAL_TABLET | Freq: Two times a day (BID) | ORAL | Status: DC
  Administered 2018-03-18 – 2018-03-21 (×8): 50 mg via ORAL
  Filled 2018-03-18 (×8): qty 1

## 2018-03-18 NOTE — Progress Notes (Signed)
[place] for heparin Indication: atrial fibrillation/chest pain  No Known Allergies  Patient Measurements: Height: 6' (182.9 cm) Weight: 207 lb 14.3 oz (94.3 kg) IBW/kg (Calculated) : 77.6 Heparin Dosing Weight: 95kg  Vital Signs: Temp: 97.8 F (36.6 C) (09/20 1210) Temp Source: Oral (09/20 1210) BP: 97/73 (09/20 1210)  Labs: Recent Labs    03/16/18 0230  03/17/18 0446 03/18/18 0448 03/18/18 1130 03/18/18 1844  HGB 14.2  --  14.1 13.2  --   --   HCT 40.1  --  41.8 38.2*  --   --   PLT 192  --  195 168  --   --   LABPROT  --   --  14.1  --   --   --   INR  --   --  1.10  --   --   --   HEPARINUNFRC 0.32   < > 0.31 0.27* 0.20* 0.32  CREATININE 0.94  --  1.05 1.05  --   --    < > = values in this interval not displayed.    Estimated Creatinine Clearance: 74.7 mL/min (by C-G formula based on SCr of 1.05 mg/dL).   Medical History: Past Medical History:  Diagnosis Date  . CAD (coronary artery disease)   . Persistent atrial fibrillation ([place])   . Tachycardia      Assessment: 66 yoM transferred from [facility] for consideration for PCI reattempt vs CABG consult. Pt started on IV heparin for ACS r/o and new onset AFib. -heparin level at goal after increase to 2000 units/hr    Goal of Therapy:  Heparin level 0.3-0.7 units/ml Monitor platelets by anticoagulation protocol: Yes   Plan: -No heparin changes needed -Daily heparin level and CBC  Thank you for involving pharmacy in this patient's care.  Hildred Laser, PharmD Clinical Pharmacist Please check Amion for pharmacy contact number

## 2018-03-18 NOTE — Progress Notes (Signed)
1130-1140 Came to see pt to walk. Pt has been walking independently without CP.  No questions re preop  ed done yesterday. Pt has been using his IS and can now get to 1500-1750 ml correctly. Since pt can walk independently will sign off until after surgery.  Graylon Good RN BSN 03/18/2018 11:41 AM

## 2018-03-18 NOTE — Progress Notes (Signed)
Progress Note  Patient Name: David Mccullough Date of Encounter: 03/18/2018  Primary Cardiologist: No primary care provider on file. [facility])  Subjective   No chest pain or dyspnea. Fever over last 24 hours. No coughing.  Inpatient Medications    Scheduled Meds: . aspirin EC  81 mg Oral Daily  . atorvastatin  80 mg Oral q1800  . metoprolol tartrate  25 mg Oral BID  . multivitamin with minerals  1 tablet Oral Daily  . mupirocin cream   Topical BID  . sodium chloride flush  3 mL Intravenous Q12H  . traZODone  50 mg Oral QHS   Continuous Infusions: . sodium chloride    . ceFEPime (MAXIPIME) IV 2 g (03/18/18 0453)  . heparin 1,800 Units/hr (03/18/18 0604)   PRN Meds: sodium chloride, acetaminophen, nitroGLYCERIN, ondansetron (ZOFRAN) IV, sodium chloride flush   Vital Signs    Vitals:   03/17/18 2212 03/18/18 0458 03/18/18 0502 03/18/18 0641  BP:   121/77   Pulse:      Resp:   18   Temp: 99.3 F (37.4 C) (!) 100.9 F (38.3 C)  98.7 F (37.1 C)  TempSrc: Oral Oral  Oral  SpO2:   98%   Weight:      Height:        Intake/Output Summary (Last 24 hours) at 03/18/2018 5102 Last data filed at 03/18/2018 0700 Gross per 24 hour  Intake 808 ml  Output 1100 ml  Net -292 ml   Filed Weights   03/16/18 1831 03/17/18 0500  Weight: 95.6 kg 94.3 kg    Telemetry    Atrial fibrillation - Personally Reviewed  ECG    No AM EKG - Personally Reviewed  Physical Exam    General: Well developed, well nourished, NAD  HEENT: OP clear, mucus membranes moist  SKIN: warm, dry. No rashes. Neuro: No focal deficits  Musculoskeletal: Muscle strength 5/5 all ext  Psychiatric: Mood and affect normal  Neck: No JVD, no carotid bruits, no thyromegaly, no lymphadenopathy.  Lungs:Clear bilaterally, no wheezes, rhonci, crackles Cardiovascular: Irreg irreg. No murmurs, gallops or rubs. Abdomen:Soft. Bowel sounds present. Non-tender.  Extremities: No lower extremity edema. Pulses  are 2 + in the bilateral DP/PT.    Labs    Chemistry Recent Labs  Lab 03/16/18 0230 03/17/18 0446 03/18/18 0448  NA 138 137 136  K 4.0 3.7 3.8  CL 106 105 104  CO2 25 24 23   GLUCOSE 112* 120* 119*  BUN 24* 13 15  CREATININE 0.94 1.05 1.05  CALCIUM 9.1 9.2 9.2  PROT  --  6.2*  --   ALBUMIN  --  3.5  --   AST  --  125*  --   ALT  --  33  --   ALKPHOS  --  47  --   BILITOT  --  1.2  --   GFRNONAA >60 >60 >60  GFRAA >60 >60 >60  ANIONGAP 7 8 9      Hematology Recent Labs  Lab 03/16/18 0230 03/17/18 0446 03/18/18 0448  WBC 8.9 10.6* 11.1*  RBC 4.68 4.86 4.50  HGB 14.2 14.1 13.2  HCT 40.1 41.8 38.2*  MCV 85.7 86.0 84.9  MCH 30.3 29.0 29.3  MCHC 35.4 33.7 34.6  RDW 13.6 12.6 12.6  PLT 192 195 168    Cardiac Enzymes Recent Labs  Lab 03/14/18 0936 03/14/18 1232 03/14/18 1618  TROPONINI 0.48* 2.52* 5.92*   No results for input(s): TROPIPOC in the last  168 hours.   BNP Recent Labs  Lab 03/14/18 0936  BNP 180.0*     DDimer No results for input(s): DDIMER in the last 168 hours.   Radiology    Dg Chest 2 View  Result Date: 03/17/2018 CLINICAL DATA:  Patient with history of chest pain. EXAM: CHEST - 2 VIEW COMPARISON:  Chest radiograph 03/14/2018 FINDINGS: Monitoring leads overlie the patient. Stable cardiomegaly. Tortuosity and calcification of the thoracic aorta. Patchy consolidation right mid lung. No pleural effusion or pneumothorax. Thoracic spine degenerative changes. IMPRESSION: Patchy consolidation right mid lung may represent infection in the appropriate clinical setting. Followup PA and lateral chest X-ray is recommended in 3-4 weeks following trial of antibiotic therapy to ensure resolution and exclude underlying malignancy. Electronically Signed   By: Lovey Newcomer M.D.   On: 03/17/2018 09:40    Cardiac Studies   Cardiac cath 03/15/18: Cardiac cath 03/15/18:  Prox LAD lesion is 80% stenosed.  1st Mrg lesion is 99% stenosed.  Prox RCA lesion is  100% stenosed.  Diagnostic Diagram        Echo 03/17/18: - Left ventricle: The cavity size was mildly dilated. Wall   thickness was normal. Systolic function was normal. The estimated   ejection fraction was in the range of 55% to 60%. - Aortic valve: There was trivial regurgitation. - Mitral valve: There was mild regurgitation. - Left atrium: The atrium was mildly dilated. - Pulmonary arteries: PA peak pressure: 46 mm Hg (S). - Pericardium, extracardiac: There was a left pleural effusion.  Patient Profile     72 y.o. 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 Failed PCI at [facility] on 03/16/18. Echo 03/17/18 with normal LV systolic function.   Assessment & Plan    1. CAD/NSTEMI: He was found to have three vessel CAD by cath at [facility] on 03/15/18. Attempted PCI of OM on 03/16/18 but unsuccessful. Chronic occlusion proximal RCA and severe disease in the LAD. He will need CABG. He has been seen by CT surgery and plans for CABG next week. He was loaded with Plavix so will need Plavix washout before CABG. LV systolic function is normal by echo 02/1918.  No chest pain this am. Continue IV heparin. Continue ASA, statin and beta blocker.   2. Atrial fibrillation, persistent: Will continue beta blocker and heparin.  Will increase beta blocker for better reate control.   3. Possible Hospital acquired pneumonia: Pt with fevers yesterday, low grade fever this am. Chest x-ray with patchy consolidation right mid lung. This could represent a pneumonia but he has no cough and his WBC is 11.1. He was started on Cefepime yesterday. Will continue. Repeat chest x-ray this weekend.   For questions or updates, please contact [place] Please consult [email or web address] for contact info under        Signed, Lauree Chandler,  MD  03/18/2018, 8:23 AM

## 2018-03-18 NOTE — Progress Notes (Signed)
[place] for heparin Indication: atrial fibrillation/chest pain  No Known Allergies  Patient Measurements: Height: 6' (182.9 cm) Weight: 207 lb 14.3 oz (94.3 kg) IBW/kg (Calculated) : 77.6 Heparin Dosing Weight: 95kg  Vital Signs: Temp: 98.7 F (37.1 C) (09/20 0641) Temp Source: Oral (09/20 0641) BP: 121/77 (09/20 0502)  Labs: Recent Labs    03/16/18 0230  03/17/18 0446 03/18/18 0448 03/18/18 1130  HGB 14.2  --  14.1 13.2  --   HCT 40.1  --  41.8 38.2*  --   PLT 192  --  195 168  --   LABPROT  --   --  14.1  --   --   INR  --   --  1.10  --   --   HEPARINUNFRC 0.32   < > 0.31 0.27* 0.20*  CREATININE 0.94  --  1.05 1.05  --    < > = values in this interval not displayed.    Estimated Creatinine Clearance: 74.7 mL/min (by C-G formula based on SCr of 1.05 mg/dL).   Medical History: Past Medical History:  Diagnosis Date  . CAD (coronary artery disease)   . Persistent atrial fibrillation ([place])   . Tachycardia      Assessment: 26 yoM transferred from [facility] for consideration for PCI reattempt vs CABG consult. Pt started on IV heparin for ACS r/o and new onset AFib.  9/20: Heparin level currently subtherapeutic at 0.20 (decreased after rate increase to 1800units/hr). Uncertain why requiring higher dosing of heparin, as patient previously therapeutic at lower doses. CBC WNL. Spoke with RN, no issues with infusion and no bleeding noted.  Goal of Therapy:  Heparin level 0.3-0.7 units/ml Monitor platelets by anticoagulation protocol: Yes   Plan:  -Increase  IV heparin 2000 units/hr -Will check heparin level in 6 hours -Daily heparin level and CBC  Thank you for involving pharmacy in this patient's care.  Janae Bridgeman, PharmD PGY1 Pharmacy Resident Phone: [phone] 03/18/2018 12:55 PM

## 2018-03-18 NOTE — Plan of Care (Signed)
  Problem: Education: Goal: Knowledge of General Education information will improve Description Including pain rating scale, medication(s)/side effects and non-pharmacologic comfort measures Outcome: Progressing   Problem: Health Behavior/Discharge Planning: Goal: Ability to manage health-related needs will improve Outcome: Progressing   Problem: Clinical Measurements: Goal: Ability to maintain clinical measurements within normal limits will improve Outcome: Progressing Goal: Will remain free from infection Outcome: Progressing Goal: Diagnostic test results will improve Outcome: Progressing Goal: Respiratory complications will improve Outcome: Progressing Goal: Cardiovascular complication will be avoided Outcome: Progressing   Problem: Activity: Goal: Risk for activity intolerance will decrease Outcome: Progressing   Problem: Nutrition: Goal: Adequate nutrition will be maintained Outcome: Progressing   Problem: Coping: Goal: Level of anxiety will decrease Outcome: Progressing   Problem: Elimination: Goal: Will not experience complications related to bowel motility Outcome: Progressing Goal: Will not experience complications related to urinary retention Outcome: Progressing   Problem: Pain Managment: Goal: General experience of comfort will improve Outcome: Progressing   Problem: Safety: Goal: Ability to remain free from injury will improve Outcome: Progressing   Problem: Skin Integrity: Goal: Risk for impaired skin integrity will decrease Outcome: Progressing   Problem: Education: Goal: Knowledge of disease or condition will improve Outcome: Progressing Goal: Understanding of medication regimen will improve Outcome: Progressing Goal: Individualized Educational Video(s) Outcome: Progressing   Problem: Activity: Goal: Ability to tolerate increased activity will improve Outcome: Progressing   Problem: Cardiac: Goal: Ability to achieve and maintain  adequate cardiopulmonary perfusion will improve Outcome: Progressing   Problem: Health Behavior/Discharge Planning: Goal: Ability to safely manage health-related needs after discharge will improve Outcome: Progressing   Problem: Education: Goal: Understanding of cardiac disease, CV risk reduction, and recovery process will improve Outcome: Progressing Goal: Understanding of medication regimen will improve Outcome: Progressing Goal: Individualized Educational Video(s) Outcome: Progressing   Problem: Activity: Goal: Ability to tolerate increased activity will improve Outcome: Progressing   Problem: Cardiac: Goal: Ability to achieve and maintain adequate cardiopulmonary perfusion will improve Outcome: Progressing Goal: Vascular access site(s) Level 0-1 will be maintained Outcome: Progressing   Problem: Health Behavior/Discharge Planning: Goal: Ability to safely manage health-related needs after discharge will improve Outcome: Progressing

## 2018-03-18 NOTE — Progress Notes (Signed)
ANTICOAGULATION CONSULT NOTE - Follow Up Consult  Pharmacy Consult for heparin Indication: CAD and Afib  Labs: Recent Labs    03/16/18 0230 03/16/18 2138 03/17/18 0446 03/18/18 0448  HGB 14.2  --  14.1 13.2  HCT 40.1  --  41.8 38.2*  PLT 192  --  195 168  LABPROT  --   --  14.1  --   INR  --   --  1.10  --   HEPARINUNFRC 0.32 0.29* 0.31 0.27*  CREATININE 0.94  --  1.05 1.05    Assessment: 72yo male subtherapeutic on heparin after one level at low end of goal.  Goal of Therapy:  Heparin level 0.3-0.7 units/ml   Plan:  Will increase heparin gtt by 2 units/kg/hr to 1800 units/hr and check level in 6 hours.    Wynona Neat, PharmD, BCPS  03/18/2018,6:00 AM

## 2018-03-19 LAB — BASIC METABOLIC PANEL
ANION GAP: 10 (ref 5–15)
BUN: 21 mg/dL (ref 8–23)
CALCIUM: 9.1 mg/dL (ref 8.9–10.3)
CO2: 22 mmol/L (ref 22–32)
Chloride: 103 mmol/L (ref 98–111)
Creatinine, Ser: 1.11 mg/dL (ref 0.61–1.24)
GFR calc Af Amer: >60 mL/min (ref >60)
GLUCOSE: 112 mg/dL — AB (ref 70–99)
POTASSIUM: 4 mmol/L (ref 3.5–5.1)
Sodium: 135 mmol/L (ref 135–145)

## 2018-03-19 LAB — URINE CULTURE: Culture: NO GROWTH

## 2018-03-19 LAB — CBC
HEMATOCRIT: 36.2 % — AB (ref 39.0–52.0)
HEMOGLOBIN: 12.2 g/dL — AB (ref 13.0–17.0)
MCH: 29.2 pg (ref 26.0–34.0)
MCHC: 33.7 g/dL (ref 30.0–36.0)
MCV: 86.6 fL (ref 78.0–100.0)
Platelets: 175 10*3/uL (ref 150–400)
RBC: 4.18 MIL/uL — AB (ref 4.22–5.81)
RDW: 12.7 % (ref 11.5–15.5)
WBC: 10.3 10*3/uL (ref 4.0–10.5)

## 2018-03-19 LAB — HEPARIN LEVEL (UNFRACTIONATED): Heparin Unfractionated: 0.32 IU/mL (ref 0.30–0.70)

## 2018-03-19 MED ORDER — HYDROCORTISONE 1 % EX CREA
TOPICAL_CREAM | CUTANEOUS | Status: DC | PRN
  Administered 2018-03-19: 15:00:00 via TOPICAL
  Filled 2018-03-19: qty 28

## 2018-03-19 MED ORDER — MAGNESIUM HYDROXIDE 400 MG/5ML PO SUSP
30.0000 mL | Freq: Every day | ORAL | Status: DC | PRN
  Administered 2018-03-19 – 2018-03-20 (×2): 30 mL via ORAL
  Filled 2018-03-19 (×2): qty 30

## 2018-03-19 NOTE — Progress Notes (Signed)
[place] for heparin Indication: atrial fibrillation/chest pain  No Known Allergies  Patient Measurements: Height: 6' (182.9 cm) Weight: 207 lb 14.3 oz (94.3 kg) IBW/kg (Calculated) : 77.6 Heparin Dosing Weight: 95kg  Vital Signs: Temp: 98.1 F (36.7 C) (09/21 0433) Temp Source: Oral (09/21 0433) BP: 111/77 (09/21 0433) Pulse Rate: 87 (09/21 0433)  Labs: Recent Labs    03/17/18 0446 03/18/18 0448 03/18/18 1130 03/18/18 1844 03/19/18 0330  HGB 14.1 13.2  --   --  12.2*  HCT 41.8 38.2*  --   --  36.2*  PLT 195 168  --   --  175  LABPROT 14.1  --   --   --   --   INR 1.10  --   --   --   --   HEPARINUNFRC 0.31 0.27* 0.20* 0.32 0.32  CREATININE 1.05 1.05  --   --  1.11    Estimated Creatinine Clearance: 70.7 mL/min (by C-G formula based on SCr of 1.11 mg/dL).   Medical History: Past Medical History:  Diagnosis Date  . CAD (coronary artery disease)   . Persistent atrial fibrillation ([place])   . Tachycardia      Assessment: 59 yoM transferred from [facility] for consideration for PCI reattempt vs CABG consult. Pt started on IV heparin for ACS r/o and new-onset AFib. Now awaiting Plavix washout for CABG. Heparin level remains at goal. Hg down to 12.2, plt wnl. No bleed documented.  Goal of Therapy:  Heparin level 0.3-0.7 units/ml Monitor platelets by anticoagulation protocol: Yes   Plan: Continue heparin at 2000 units/hr Monitor daily heparin level and CBC, s/sx bleeding CABG scheduled for 9/24  Elicia Lamp, PharmD, BCPS Clinical Pharmacist Clinical phone [phone] Please check AMION for all [place] contact numbers 03/19/2018 10:47 AM

## 2018-03-19 NOTE — Progress Notes (Signed)
Subjective:  He is walking the halls.  Awaiting Plavix washout for bypass grafting.  No complaints of shortness of breath or chest pain.  No coughing.  Temperature 99.5 yesterday continue to watch  Objective:  Vital Signs in the last 24 hours: BP 111/77 (BP Location: Left Arm)   Pulse 87   Temp 98.1 F (36.7 C) (Oral)   Resp 16   Ht 6' (1.829 m)   Wt 94.3 kg   SpO2 94%   BMI 28.20 kg/m   Physical Exam: Pleasant mildly obese white male no acute distress Lungs:  Clear Cardiac: Irregular rhythm, normal S1 and S2, no S3 Extremities:  No edema present  Intake/Output from previous day: 09/20 0701 - 09/21 0700 In: 1480 [P.O.:920; I.V.:360; IV Piggyback:200] Out: 1125 [Urine:1125]  Weight Filed Weights   03/16/18 1831 03/17/18 0500  Weight: 95.6 kg 94.3 kg    Lab Results: Basic Metabolic Panel: Recent Labs    03/18/18 0448 03/19/18 0330  NA 136 135  K 3.8 4.0  CL 104 103  CO2 23 22  GLUCOSE 119* 112*  BUN 15 21  CREATININE 1.05 1.11   CBC: Recent Labs    03/18/18 0448 03/19/18 0330  WBC 11.1* 10.3  HGB 13.2 12.2*  HCT 38.2* 36.2*  MCV 84.9 86.6  PLT 168 175   Telemetry: Atrial fibrillation, rates go up with activity  Assessment/Plan: 1.  CAD with non-STEMI awaiting coronary bypass grafting on Tuesday 2.  Persistent atrial fibrillation 3.  Possible pneumonia but fever is better today.  Will repeat chest x-ray tomorrow.      Kerry Hough  MD [facility]  03/19/2018, 9:19 AM

## 2018-03-20 ENCOUNTER — Encounter (HOSPITAL_COMMUNITY): Payer: Self-pay

## 2018-03-20 ENCOUNTER — Inpatient Hospital Stay (HOSPITAL_COMMUNITY): Payer: Medicare Other

## 2018-03-20 LAB — CBC
HCT: 36.5 % — ABNORMAL LOW (ref 39.0–52.0)
Hemoglobin: 12.2 g/dL — ABNORMAL LOW (ref 13.0–17.0)
MCH: 29.3 pg (ref 26.0–34.0)
MCHC: 33.4 g/dL (ref 30.0–36.0)
MCV: 87.7 fL (ref 78.0–100.0)
PLATELETS: 210 10*3/uL (ref 150–400)
RBC: 4.16 MIL/uL — AB (ref 4.22–5.81)
RDW: 12.7 % (ref 11.5–15.5)
WBC: 8.5 10*3/uL (ref 4.0–10.5)

## 2018-03-20 LAB — HEPARIN LEVEL (UNFRACTIONATED)
Heparin Unfractionated: 0.22 IU/mL — ABNORMAL LOW (ref 0.30–0.70)
Heparin Unfractionated: 0.56 IU/mL (ref 0.30–0.70)

## 2018-03-20 MED ORDER — PSYLLIUM 95 % PO PACK
1.0000 | PACK | Freq: Every day | ORAL | Status: DC
  Administered 2018-03-20: 1 via ORAL
  Filled 2018-03-20: qty 1

## 2018-03-20 NOTE — Progress Notes (Signed)
ANTICOAGULATION CONSULT NOTE - Follow Up Consult  Pharmacy Consult for heparin Indication: CAD and Afib  Labs: Recent Labs    03/18/18 0448  03/18/18 1844 03/19/18 0330 03/20/18 0353  HGB 13.2  --   --  12.2* 12.2*  HCT 38.2*  --   --  36.2* 36.5*  PLT 168  --   --  175 210  HEPARINUNFRC 0.27*   < > 0.32 0.32 0.22*  CREATININE 1.05  --   --  1.11  --    < > = values in this interval not displayed.    Assessment: 72yo male subtherapeutic on heparin after two levels at low end of goal.  Goal of Therapy:  Heparin level 0.3-0.7 units/ml   Plan:  Will increase heparin gtt by 20% to 2400 units/hr and check level in 6 hours.    Wynona Neat, PharmD, BCPS  03/20/2018,6:42 AM

## 2018-03-20 NOTE — Progress Notes (Signed)
[place] for heparin Indication: atrial fibrillation/chest pain  No Known Allergies  Patient Measurements: Height: 6' (182.9 cm) Weight: 207 lb 14.3 oz (94.3 kg) IBW/kg (Calculated) : 77.6 Heparin Dosing Weight: 95kg  Vital Signs: Temp: 98.7 F (37.1 C) (09/22 1429) Temp Source: Oral (09/22 1429) BP: 110/65 (09/22 1429) Pulse Rate: 78 (09/22 1429)  Labs: Recent Labs    03/18/18 0448  03/19/18 0330 03/20/18 0353 03/20/18 1510  HGB 13.2  --  12.2* 12.2*  --   HCT 38.2*  --  36.2* 36.5*  --   PLT 168  --  175 210  --   HEPARINUNFRC 0.27*   < > 0.32 0.22* 0.56  CREATININE 1.05  --  1.11  --   --    < > = values in this interval not displayed.    Estimated Creatinine Clearance: 70.7 mL/min (by C-G formula based on SCr of 1.11 mg/dL).   Medical History: Past Medical History:  Diagnosis Date  . CAD (coronary artery disease)   . Persistent atrial fibrillation ([place])   . Tachycardia      Assessment: 4 yoM transferred from [facility] for consideration for PCI reattempt vs CABG consult. Pt started on IV heparin for ACS r/o and new-onset AFib. Now awaiting Plavix washout for CABG. Heparin level at goal after rate increase. Hg down to 12.2, plt wnl. No bleed documented.  Goal of Therapy:  Heparin level 0.3-0.7 units/ml Monitor platelets by anticoagulation protocol: Yes   Plan: Continue heparin at 2400 units/hr Confirmatory heparin level with AM labs Monitor daily heparin level and CBC, s/sx bleeding CABG scheduled for 9/24  Elicia Lamp, PharmD, BCPS Clinical Pharmacist Clinical phone [phone] Please check AMION for all [place] contact numbers 03/20/2018 3:46 PM

## 2018-03-20 NOTE — Progress Notes (Signed)
Subjective:  Maximum temperature yesterday was 99.3.  Feels well.  No complaints of shortness of breath or chest pain.  Continues in atrial fibrillation.  Surgery planned for Tuesday.  Objective:  Vital Signs in the last 24 hours: BP 110/73   Pulse 87   Temp 98.2 F (36.8 C) (Oral)   Resp (!) 22   Ht 6' (1.829 m)   Wt 94.3 kg   SpO2 96%   BMI 28.20 kg/m   Physical Exam: Pleasant mildly obese white male no acute distress Lungs:  Clear Cardiac: Irregular rhythm, normal S1 and S2, no S3 Extremities:  No edema present  Intake/Output from previous day: 09/21 0701 - 09/22 0700 In: 1710 [P.O.:990; I.V.:420; IV Piggyback:300] Out: 1500 [Urine:1500]  Weight Filed Weights   03/16/18 1831 03/17/18 0500  Weight: 95.6 kg 94.3 kg    Lab Results: Basic Metabolic Panel: Recent Labs    03/18/18 0448 03/19/18 0330  NA 136 135  K 3.8 4.0  CL 104 103  CO2 23 22  GLUCOSE 119* 112*  BUN 15 21  CREATININE 1.05 1.11   CBC: Recent Labs    03/19/18 0330 03/20/18 0353  WBC 10.3 8.5  HGB 12.2* 12.2*  HCT 36.2* 36.5*  MCV 86.6 87.7  PLT 175 210   Telemetry: Atrial fibrillation, rates go up with activity  Assessment/Plan: 1.  CAD with non-STEMI awaiting coronary bypass grafting on Tuesday 2.  Persistent atrial fibrillation 3.  Possible pneumonia fever is down.  Repeat chest x-ray done this morning did not show any evidence of pneumonia.     Kerry Hough  MD [facility] Cardiology  03/20/2018, 9:15 AM

## 2018-03-21 LAB — HEPARIN LEVEL (UNFRACTIONATED): Heparin Unfractionated: 0.59 IU/mL (ref 0.30–0.70)

## 2018-03-21 LAB — CBC
HCT: 34.6 % — ABNORMAL LOW (ref 39.0–52.0)
Hemoglobin: 11.5 g/dL — ABNORMAL LOW (ref 13.0–17.0)
MCH: 28.6 pg (ref 26.0–34.0)
MCHC: 33.2 g/dL (ref 30.0–36.0)
MCV: 86.1 fL (ref 78.0–100.0)
PLATELETS: 206 10*3/uL (ref 150–400)
RBC: 4.02 MIL/uL — AB (ref 4.22–5.81)
RDW: 12.4 % (ref 11.5–15.5)
WBC: 7.7 10*3/uL (ref 4.0–10.5)

## 2018-03-21 LAB — ABO/RH: ABO/RH(D): B POS

## 2018-03-21 LAB — PLATELET INHIBITION P2Y12: Platelet Function  P2Y12: 263 [PRU] (ref 194–418)

## 2018-03-21 MED ORDER — ALPRAZOLAM 0.25 MG PO TABS: 0.2500 mg | ORAL_TABLET | ORAL | Status: DC | PRN

## 2018-03-21 MED ORDER — SODIUM CHLORIDE 0.9 % IV SOLN: 1.5000 g | INTRAVENOUS | Status: DC

## 2018-03-21 MED ORDER — CHLORHEXIDINE GLUCONATE 4 % EX LIQD
60.0000 mL | Freq: Once | CUTANEOUS | Status: AC
  Administered 2018-03-22: 4 via TOPICAL
  Filled 2018-03-21: qty 60

## 2018-03-21 MED ORDER — SODIUM CHLORIDE 0.9 % IV SOLN
INTRAVENOUS | Status: DC
  Filled 2018-03-21: qty 30

## 2018-03-21 MED ORDER — POTASSIUM CHLORIDE 2 MEQ/ML IV SOLN
80.0000 meq | INTRAVENOUS | Status: DC
  Filled 2018-03-21: qty 40

## 2018-03-21 MED ORDER — DIAZEPAM 5 MG PO TABS
5.0000 mg | ORAL_TABLET | Freq: Once | ORAL | Status: AC
  Administered 2018-03-22: 5 mg via ORAL
  Filled 2018-03-21: qty 1

## 2018-03-21 MED ORDER — TEMAZEPAM 7.5 MG PO CAPS: 15.0000 mg | ORAL_CAPSULE | Freq: Once | ORAL | Status: DC | PRN

## 2018-03-21 MED ORDER — VANCOMYCIN HCL 10 G IV SOLR
1500.0000 mg | INTRAVENOUS | Status: AC
  Administered 2018-03-22: 1500 mg via INTRAVENOUS
  Filled 2018-03-21: qty 1500

## 2018-03-21 MED ORDER — METOPROLOL TARTRATE 12.5 MG HALF TABLET
12.5000 mg | ORAL_TABLET | Freq: Once | ORAL | Status: AC
  Administered 2018-03-22: 12.5 mg via ORAL
  Filled 2018-03-21: qty 1

## 2018-03-21 MED ORDER — PLASMA-LYTE 148 IV SOLN
INTRAVENOUS | Status: AC
  Administered 2018-03-22: 500 mL
  Filled 2018-03-21: qty 2.5

## 2018-03-21 MED ORDER — CHLORHEXIDINE GLUCONATE 0.12 % MT SOLN
15.0000 mL | Freq: Once | OROMUCOSAL | Status: AC
  Administered 2018-03-22: 15 mL via OROMUCOSAL
  Filled 2018-03-21: qty 15

## 2018-03-21 MED ORDER — PHENYLEPHRINE HCL-NACL 20-0.9 MG/250ML-% IV SOLN
30.0000 ug/min | INTRAVENOUS | Status: DC
  Filled 2018-03-21: qty 250

## 2018-03-21 MED ORDER — VANCOMYCIN HCL 10 G IV SOLR: 1500.0000 mg | INTRAVENOUS | Status: DC

## 2018-03-21 MED ORDER — CHLORHEXIDINE GLUCONATE 4 % EX LIQD
60.0000 mL | Freq: Once | CUTANEOUS | Status: AC
  Administered 2018-03-21: 4 via TOPICAL
  Filled 2018-03-21: qty 60

## 2018-03-21 MED ORDER — TRANEXAMIC ACID 1000 MG/10ML IV SOLN
1.5000 mg/kg/h | INTRAVENOUS | Status: DC
  Filled 2018-03-21: qty 25

## 2018-03-21 MED ORDER — MUPIROCIN CALCIUM 2 % EX CREA: TOPICAL_CREAM | Freq: Two times a day (BID) | CUTANEOUS | Status: DC

## 2018-03-21 MED ORDER — TRANEXAMIC ACID (OHS) BOLUS VIA INFUSION
15.0000 mg/kg | INTRAVENOUS | Status: AC
  Administered 2018-03-22: 1414.5 mg via INTRAVENOUS
  Filled 2018-03-21: qty 1415

## 2018-03-21 MED ORDER — SODIUM CHLORIDE 0.9 % IV SOLN
1.5000 g | INTRAVENOUS | Status: AC
  Administered 2018-03-22: 1.5 g via INTRAVENOUS
  Administered 2018-03-22: .75 g via INTRAVENOUS
  Filled 2018-03-21: qty 1.5

## 2018-03-21 MED ORDER — MAGNESIUM SULFATE 50 % IJ SOLN
40.0000 meq | INTRAMUSCULAR | Status: DC
  Filled 2018-03-21: qty 9.85

## 2018-03-21 MED ORDER — DEXMEDETOMIDINE HCL IN NACL 400 MCG/100ML IV SOLN
0.1000 ug/kg/h | INTRAVENOUS | Status: DC
  Filled 2018-03-21: qty 100

## 2018-03-21 MED ORDER — EPINEPHRINE PF 1 MG/ML IJ SOLN
0.0000 ug/min | INTRAVENOUS | Status: DC
  Filled 2018-03-21: qty 4

## 2018-03-21 MED ORDER — DOCUSATE SODIUM 100 MG PO CAPS: 100.0000 mg | ORAL_CAPSULE | Freq: Two times a day (BID) | ORAL | Status: DC | PRN

## 2018-03-21 MED ORDER — NITROGLYCERIN IN D5W 200-5 MCG/ML-% IV SOLN
2.0000 ug/min | INTRAVENOUS | Status: AC
  Administered 2018-03-22: 10 ug/min via INTRAVENOUS
  Filled 2018-03-21: qty 250

## 2018-03-21 MED ORDER — SODIUM CHLORIDE 0.9 % IV SOLN
INTRAVENOUS | Status: DC
  Filled 2018-03-21: qty 1

## 2018-03-21 MED ORDER — SODIUM CHLORIDE 0.9 % IV SOLN
750.0000 mg | INTRAVENOUS | Status: DC
  Filled 2018-03-21: qty 750

## 2018-03-21 MED ORDER — TRANEXAMIC ACID (OHS) PUMP PRIME SOLUTION
2.0000 mg/kg | INTRAVENOUS | Status: DC
  Filled 2018-03-21: qty 1.89

## 2018-03-21 MED ORDER — DOPAMINE-DEXTROSE 3.2-5 MG/ML-% IV SOLN
0.0000 ug/kg/min | INTRAVENOUS | Status: DC
  Filled 2018-03-21: qty 250

## 2018-03-21 MED ORDER — BISACODYL 5 MG PO TBEC
5.0000 mg | DELAYED_RELEASE_TABLET | Freq: Once | ORAL | Status: AC
  Administered 2018-03-21: 5 mg via ORAL
  Filled 2018-03-21: qty 1

## 2018-03-21 MED ORDER — MILRINONE LACTATE IN DEXTROSE 20-5 MG/100ML-% IV SOLN
0.3000 ug/kg/min | INTRAVENOUS | Status: DC
  Filled 2018-03-21: qty 100

## 2018-03-21 NOTE — Anesthesia Preprocedure Evaluation (Addendum)
Anesthesia Evaluation  Patient identified by MRN, date of birth, ID band Patient awake    Reviewed: Allergy & Precautions, NPO status , Patient's Chart, lab work & pertinent test results  History of Anesthesia Complications Negative for: history of anesthetic complications  Airway Mallampati: III  TM Distance: >3 FB Neck ROM: Full    Dental  (+) Dental Advisory Given, Teeth Intact   Pulmonary neg pulmonary ROS, former smoker,    breath sounds clear to auscultation       Cardiovascular + CAD and + Past MI  + dysrhythmias Atrial Fibrillation  Rhythm:Regular Rate:Normal   '19 Carotid US - 1-39% bilateral ICAS  '19 TTE - LV was mildly dilated. Wall thickness was normal. EF 55% to 60%. Trivial AI, mild MR, mildly dilated LA. PASP 46 mm Hg. There was a left pleural effusion.  '19 Cath - Prox LAD lesion is 80% stenosed. 1st Mrg lesion is 99% stenosed. Prox RCA lesion is 100% stenosed     Neuro/Psych negative neurological ROS  negative psych ROS   GI/Hepatic negative GI ROS, Neg liver ROS,   Endo/Other  negative endocrine ROS  Renal/GU negative Renal ROS  negative genitourinary   Musculoskeletal negative musculoskeletal ROS (+)   Abdominal   Peds  Hematology  (+) anemia ,   Anesthesia Other Findings   Reproductive/Obstetrics                            Anesthesia Physical Anesthesia Plan  ASA: IV  Anesthesia Plan: General   Post-op Pain Management:    Induction: Intravenous  PONV Risk Score and Plan: 2 and Treatment may vary due to age or medical condition, Ondansetron, Dexamethasone and Midazolam  Airway Management Planned: Oral ETT  Additional Equipment: Arterial line, CVP, PA Cath, TEE and Ultrasound Guidance Line Placement  Intra-op Plan:   Post-operative Plan: Post-operative intubation/ventilation  Informed Consent: I have reviewed the patients History and Physical,  chart, labs and discussed the procedure including the risks, benefits and alternatives for the proposed anesthesia with the patient or authorized representative who has indicated his/her understanding and acceptance.   Dental advisory given  Plan Discussed with: CRNA and Anesthesiologist  Anesthesia Plan Comments:        Anesthesia Quick Evaluation

## 2018-03-21 NOTE — Progress Notes (Addendum)
Progress Note  Patient Name: David Mccullough Date of Encounter: 03/21/2018  Primary Cardiologist: No primary care provider on file.  Subjective   Feeling well, planned for surgery in the morning.   Inpatient Medications    Scheduled Meds: . aspirin EC  81 mg Oral Daily  . atorvastatin  80 mg Oral q1800  . chlorhexidine  60 mL Topical Once   And  . [START ON 03/22/2018] chlorhexidine  60 mL Topical Once  . [START ON 03/22/2018] chlorhexidine  15 mL Mouth/Throat Once  . [START ON 03/22/2018] diazepam  5 mg Oral Once  . [START ON 03/22/2018] heparin-papaverine-plasmalyte irrigation   Irrigation To OR  . [START ON 03/22/2018] magnesium sulfate  40 mEq Other To OR  . [START ON 03/22/2018] metoprolol tartrate  12.5 mg Oral Once  . metoprolol tartrate  50 mg Oral BID  . multivitamin with minerals  1 tablet Oral Daily  . mupirocin cream   Topical BID  . [START ON 03/22/2018] phenylephrine  30-200 mcg/min Intravenous To OR  . [START ON 03/22/2018] potassium chloride  80 mEq Other To OR  . psyllium  1 packet Oral Daily  . sodium chloride flush  3 mL Intravenous Q12H  . [START ON 03/22/2018] tranexamic acid  15 mg/kg Intravenous To OR  . [START ON 03/22/2018] tranexamic acid  2 mg/kg Intracatheter To OR  . traZODone  50 mg Oral QHS   Continuous Infusions: . sodium chloride 250 mL (03/19/18 1153)  . ceFEPime (MAXIPIME) IV 2 g (03/21/18 0416)  . [START ON 03/22/2018] cefUROXime (ZINACEF)  IV    . [START ON 03/22/2018] cefUROXime (ZINACEF)  IV    . [START ON 03/22/2018] dexmedetomidine    . [START ON 03/22/2018] DOPamine    . [START ON 03/22/2018] epinephrine    . [START ON 03/22/2018] heparin 30,000 units/NS 1000 mL solution for CELLSAVER    . heparin 2,400 Units/hr (03/21/18 0115)  . [START ON 03/22/2018] insulin (NOVOLIN-R) infusion    . [START ON 03/22/2018] milrinone    . [START ON 03/22/2018] nitroGLYCERIN    . [START ON 03/22/2018] tranexamic acid (CYKLOKAPRON) infusion ([facility])    . [START ON  03/22/2018] vancomycin     PRN Meds: sodium chloride, acetaminophen, ALPRAZolam, hydrocortisone cream, magnesium hydroxide, nitroGLYCERIN, ondansetron (ZOFRAN) IV, sodium chloride flush, temazepam   Vital Signs    Vitals:   03/20/18 1952 03/20/18 2126 03/21/18 0400 03/21/18 0811  BP: 116/84 113/73 109/73 114/89  Pulse: 80 86  79  Resp: (!) 22     Temp: 98.2 F (36.8 C)  98.6 F (37 C)   TempSrc: Oral  Oral   SpO2: 100%  96%   Weight:      Height:        Intake/Output Summary (Last 24 hours) at 03/21/2018 1025 Last data filed at 03/21/2018 0600 Gross per 24 hour  Intake 2254.01 ml  Output 1525 ml  Net 729.01 ml   Filed Weights   03/16/18 1831 03/17/18 0500  Weight: 95.6 kg 94.3 kg    Telemetry    Afib rate controlled - Personally Reviewed  ECG    N/a - Personally Reviewed  Physical Exam   General: Well developed, well nourished, male appearing in no acute distress. Head: Normocephalic, atraumatic.  Neck: Supple without bruits, JVD. Lungs:  Resp regular and unlabored, CTA. Heart: Irreg Irreg, S1, S2, no S3, S4, or murmur; no rub. Abdomen: Soft, non-tender, non-distended with normoactive bowel sounds. Extremities: No clubbing, cyanosis, edema. Distal  pedal pulses are 2+ bilaterally. Neuro: Alert and oriented X 3. Moves all extremities spontaneously. Psych: Normal affect.  Labs    Chemistry Recent Labs  Lab 03/17/18 0446 03/18/18 0448 03/19/18 0330  NA 137 136 135  K 3.7 3.8 4.0  CL 105 104 103  CO2 24 23 22   GLUCOSE 120* 119* 112*  BUN 13 15 21   CREATININE 1.05 1.05 1.11  CALCIUM 9.2 9.2 9.1  PROT 6.2*  --   --   ALBUMIN 3.5  --   --   AST 125*  --   --   ALT 33  --   --   ALKPHOS 47  --   --   BILITOT 1.2  --   --   GFRNONAA >60 >60 >60  GFRAA >60 >60 >60  ANIONGAP 8 9 10      Hematology Recent Labs  Lab 03/19/18 0330 03/20/18 0353 03/21/18 0413  WBC 10.3 8.5 7.7  RBC 4.18* 4.16* 4.02*  HGB 12.2* 12.2* 11.5*  HCT 36.2* 36.5* 34.6*    MCV 86.6 87.7 86.1  MCH 29.2 29.3 28.6  MCHC 33.7 33.4 33.2  RDW 12.7 12.7 12.4  PLT 175 210 206    Cardiac Enzymes Recent Labs  Lab 03/14/18 1232 03/14/18 1618  TROPONINI 2.52* 5.92*   No results for input(s): TROPIPOC in the last 168 hours.   BNPNo results for input(s): BNP, PROBNP in the last 168 hours.   DDimer No results for input(s): DDIMER in the last 168 hours.    Radiology    Dg Chest 2 View  Result Date: 03/20/2018 CLINICAL DATA:  Fever. EXAM: CHEST - 2 VIEW COMPARISON:  03/17/2018. FINDINGS: Cardiac silhouette is normal in size. No mediastinal hilar masses. No evidence of adenopathy. There are prominent bronchovascular markings most evident in the bases. This is similar to the prior exam. Lungs otherwise clear. No pulmonary edema. No pleural effusion or pneumothorax. Skeletal structures are intact. IMPRESSION: 1. No change from the prior exam. Prominent lung markings, most evident in the lung bases and right mid lung. This is most likely chronic. No definite pneumonia and no evidence of pulmonary edema. Electronically Signed   By: Lajean Manes M.D.   On: 03/20/2018 07:35    Cardiac Studies   Cath: 03/15/18   Prox LAD lesion is 80% stenosed.  1st Mrg lesion is 99% stenosed.  Prox RCA lesion is 100% stenosed.   Conclusion Diagnostic cardiac cath with borderline left ventricular function ejection fraction 50% with inferior hypokinesis Left coronary system with high-grade disease in the proximal LAD 80% and OM1 of 99% with sluggish flow probably infarct-related artery RCA has a CTO proximally with extensive moderate collaterals filling mostly from the right Plan consider coronary bypass surgery versus multivessel PCI and stent  Recommend uninterrupted dual antiplatelet therapy with Aspirin 81mg  daily and Clopidogrel 75mg  daily for a minimum of 6 months (stable ischemic heart disease - Class I recommendation).   Cath: 03/16/18   Prox LAD lesion is 80%  stenosed.  1st Mrg lesion is 99% stenosed.  Prox RCA lesion is 100% stenosed.   Conclusion Unsuccessful PCI and stent of OM1 failure across the lesion with wire Consider transfer the patient to tertiary care center for possible attempted intervention Patient vessel went from TIMI I to TIMI 0 flow Patient remained hemodynamically stable Anticoagulation with Angiomax was discontinued he has been loaded with aspirin and Plavix  Recommend uninterrupted dual antiplatelet therapy with Aspirin 81mg  daily and Clopidogrel 75mg  daily for  a minimum of 12 months (ACS - Class I recommendation).  TTE: 03/17/18  Study Conclusions  - Left ventricle: The cavity size was mildly dilated. Wall   thickness was normal. Systolic function was normal. The estimated   ejection fraction was in the range of 55% to 60%. - Aortic valve: There was trivial regurgitation. - Mitral valve: There was mild regurgitation. - Left atrium: The atrium was mildly dilated. - Pulmonary arteries: PA peak pressure: 46 mm Hg (S). - Pericardium, extracardiac: There was a left pleural effusion.  Patient Profile     72 y.o. male with history of ? SVT s/p ablation 10 years ago in [place], newly diagnosed atrial fibrillation and newly diagnosed CAD transferred from [facility] to [facility] by the [facility] cardiology team to review possibilities for PCI vs CABG. Seen by Dr. Prescott Gum and planned for CABG.   Assessment & Plan    1. CAD/NSTEMI: remains on IV heparin, along with ASA, statin and BB. No chest pain. Walking in the hallways without complaints. Normal EF  2. PAF: new diagnosis this admission. Rate remains controlled on BB therapy. On IV heparin. Will need to discuss [place] post CABG. This patients CHA2DS2-VASc Score of at least 2 for age and MI.   3. PNA?: had a fever previously but none over the past 48 hours. Day 5 of antibiotics today. Will stop as he will be getting meds for CABG tomorrow. WBC stable.   Signed, Reino Bellis, NP    03/21/2018, 10:25 AM  Pager # [phone]   For questions or updates, please contact [place] Please consult [email or web address] for contact info under Cardiology/STEMI.  Patient seen, examined. Available data reviewed. Agree with findings, assessment, and plan as outlined by Reino Bellis, NP-C.  Exam reveals an alert, oriented male in no distress.  Lungs are clear, heart is irregularly irregular with no murmur gallop, abdomen is soft and nontender, extremities show no edema.  The patient is independently interviewed and examined.  He is stable with plans for CABG tomorrow morning.  He will require oral anticoagulation after surgery for treatment of atrial fibrillation.  He should be a good candidate for direct oral anticoagulant drug once he is stable postoperatively and all of his lines are out.  The patient's chest x-ray is reviewed and showed no acute changes.  He is concerned about the fact that he has not moved his bowels in several days.  However he does not feel the urge and his abdominal exam is benign.  He has received oral laxatives but has not had a BM since last week.  Sherren Mocha, M.D. 03/21/2018 11:36 AM

## 2018-03-21 NOTE — Plan of Care (Signed)
Patients vitals remained stable with no Chest Pain.  Heparin infusing.  Discussed CABG with patient and some of what he can expect from the procedure.

## 2018-03-21 NOTE — Progress Notes (Signed)
[name] for heparin Indication: atrial fibrillation/chest pain  No Known Allergies  Patient Measurements: Height: 6' (182.9 cm) Weight: 207 lb 14.3 oz (94.3 kg) IBW/kg (Calculated) : 77.6 Heparin Dosing Weight: 95kg  Vital Signs: Temp: 98.6 F (37 C) (09/23 0400) Temp Source: Oral (09/23 0400) BP: 114/89 (09/23 0811) Pulse Rate: 79 (09/23 0811)  Labs: Recent Labs    03/19/18 0330 03/20/18 0353 03/20/18 1510 03/21/18 0413  HGB 12.2* 12.2*  --  11.5*  HCT 36.2* 36.5*  --  34.6*  PLT 175 210  --  206  HEPARINUNFRC 0.32 0.22* 0.56 0.59  CREATININE 1.11  --   --   --     Estimated Creatinine Clearance: 70.7 mL/min (by C-G formula based on SCr of 1.11 mg/dL).   Medical History: Past Medical History:  Diagnosis Date  . CAD (coronary artery disease)   . Persistent atrial fibrillation ([place])   . Tachycardia    Assessment: David Mccullough transferred from [facility] for consideration for PCI reattempt vs CABG consult. Pt started on IV heparin for ACS r/o and new-onset AFib. Now awaiting Plavix washout for CABG.  Heparin goal is therapeutic at 0.59, on 2400 units/hr. Hgb 11.5, plt 206. No s/sx of bleeding. No infusion issues.   Goal of Therapy:  Heparin level 0.3-0.7 units/ml Monitor platelets by anticoagulation protocol: Yes   Plan: Continue heparin at 2400 units/hr Confirmatory heparin level with AM labs Monitor daily heparin level and CBC, s/sx bleeding CABG scheduled for 9/24  Doylene Canard, PharmD Clinical Pharmacist  Pager: [phone] Phone: [phone] Please check AMION for all [place] contact numbers 03/21/2018 10:06 AM

## 2018-03-22 ENCOUNTER — Inpatient Hospital Stay (HOSPITAL_COMMUNITY): Payer: Medicare Other

## 2018-03-22 ENCOUNTER — Inpatient Hospital Stay (HOSPITAL_COMMUNITY)
Admission: AD | Disposition: A | Discharge status: Home Health | Payer: Self-pay | Source: Other Acute Inpatient Hospital | Attending: Cardiothoracic Surgery

## 2018-03-22 ENCOUNTER — Inpatient Hospital Stay (HOSPITAL_COMMUNITY): Payer: Medicare Other | Admitting: Certified Registered"

## 2018-03-22 DIAGNOSIS — I2510 Atherosclerotic heart disease of native coronary artery without angina pectoris: Secondary | ICD-10-CM

## 2018-03-22 DIAGNOSIS — I340 Nonrheumatic mitral (valve) insufficiency: Secondary | ICD-10-CM

## 2018-03-22 DIAGNOSIS — Z951 Presence of aortocoronary bypass graft: Secondary | ICD-10-CM

## 2018-03-22 HISTORY — PX: CORONARY ARTERY BYPASS GRAFT: SHX141

## 2018-03-22 HISTORY — DX: Presence of aortocoronary bypass graft: Z95.1

## 2018-03-22 HISTORY — PX: LEFT ATRIAL APPENDAGE OCCLUSION: EP1229

## 2018-03-22 HISTORY — PX: TEE WITHOUT CARDIOVERSION: SHX5443

## 2018-03-22 LAB — POCT I-STAT, CHEM 8
BUN: 16 mg/dL (ref 8–23)
BUN: 15 mg/dL (ref 8–23)
BUN: 16 mg/dL (ref 8–23)
BUN: 15 mg/dL (ref 8–23)
BUN: 15 mg/dL (ref 8–23)
BUN: 15 mg/dL (ref 8–23)
BUN: 17 mg/dL (ref 8–23)
CALCIUM ION: 1.28 mmol/L (ref 1.15–1.40)
CALCIUM ION: 1.13 mmol/L — AB (ref 1.15–1.40)
CREATININE: 0.9 mg/dL (ref 0.61–1.24)
CREATININE: 0.9 mg/dL (ref 0.61–1.24)
CREATININE: 0.9 mg/dL (ref 0.61–1.24)
Calcium, Ion: 1.28 mmol/L (ref 1.15–1.40)
Calcium, Ion: 1.14 mmol/L — ABNORMAL LOW (ref 1.15–1.40)
Calcium, Ion: 1.14 mmol/L — ABNORMAL LOW (ref 1.15–1.40)
Calcium, Ion: 1.26 mmol/L (ref 1.15–1.40)
Calcium, Ion: 1.28 mmol/L (ref 1.15–1.40)
Chloride: 103 mmol/L (ref 98–111)
Chloride: 100 mmol/L (ref 98–111)
Chloride: 103 mmol/L (ref 98–111)
Chloride: 101 mmol/L (ref 98–111)
Chloride: 102 mmol/L (ref 98–111)
Chloride: 101 mmol/L (ref 98–111)
Chloride: 103 mmol/L (ref 98–111)
Creatinine, Ser: 0.9 mg/dL (ref 0.61–1.24)
Creatinine, Ser: 0.8 mg/dL (ref 0.61–1.24)
Creatinine, Ser: 0.8 mg/dL (ref 0.61–1.24)
Creatinine, Ser: 1.1 mg/dL (ref 0.61–1.24)
GLUCOSE: 111 mg/dL — AB (ref 70–99)
GLUCOSE: 117 mg/dL — AB (ref 70–99)
GLUCOSE: 206 mg/dL — AB (ref 70–99)
Glucose, Bld: 114 mg/dL — ABNORMAL HIGH (ref 70–99)
Glucose, Bld: 175 mg/dL — ABNORMAL HIGH (ref 70–99)
Glucose, Bld: 166 mg/dL — ABNORMAL HIGH (ref 70–99)
Glucose, Bld: 153 mg/dL — ABNORMAL HIGH (ref 70–99)
HCT: 30 % — ABNORMAL LOW (ref 39.0–52.0)
HCT: 26 % — ABNORMAL LOW (ref 39.0–52.0)
HCT: 28 % — ABNORMAL LOW (ref 39.0–52.0)
HCT: 24 % — ABNORMAL LOW (ref 39.0–52.0)
HCT: 26 % — ABNORMAL LOW (ref 39.0–52.0)
HCT: 24 % — ABNORMAL LOW (ref 39.0–52.0)
HCT: 31 % — ABNORMAL LOW (ref 39.0–52.0)
HEMOGLOBIN: 8.8 g/dL — AB (ref 13.0–17.0)
Hemoglobin: 10.2 g/dL — ABNORMAL LOW (ref 13.0–17.0)
Hemoglobin: 8.8 g/dL — ABNORMAL LOW (ref 13.0–17.0)
Hemoglobin: 9.5 g/dL — ABNORMAL LOW (ref 13.0–17.0)
Hemoglobin: 8.2 g/dL — ABNORMAL LOW (ref 13.0–17.0)
Hemoglobin: 8.2 g/dL — ABNORMAL LOW (ref 13.0–17.0)
Hemoglobin: 10.5 g/dL — ABNORMAL LOW (ref 13.0–17.0)
POTASSIUM: 4.5 mmol/L (ref 3.5–5.1)
POTASSIUM: 5.1 mmol/L (ref 3.5–5.1)
POTASSIUM: 4.9 mmol/L (ref 3.5–5.1)
Potassium: 4.2 mmol/L (ref 3.5–5.1)
Potassium: 4.6 mmol/L (ref 3.5–5.1)
Potassium: 4.6 mmol/L (ref 3.5–5.1)
Potassium: 4.6 mmol/L (ref 3.5–5.1)
Sodium: 135 mmol/L (ref 135–145)
Sodium: 135 mmol/L (ref 135–145)
Sodium: 136 mmol/L (ref 135–145)
Sodium: 132 mmol/L — ABNORMAL LOW (ref 135–145)
Sodium: 134 mmol/L — ABNORMAL LOW (ref 135–145)
Sodium: 133 mmol/L — ABNORMAL LOW (ref 135–145)
Sodium: 136 mmol/L (ref 135–145)
TCO2: 26 mmol/L (ref 22–32)
TCO2: 26 mmol/L (ref 22–32)
TCO2: 26 mmol/L (ref 22–32)
TCO2: 24 mmol/L (ref 22–32)
TCO2: 25 mmol/L (ref 22–32)
TCO2: 24 mmol/L (ref 22–32)
TCO2: 23 mmol/L (ref 22–32)

## 2018-03-22 LAB — POCT I-STAT 3, ART BLOOD GAS (G3+)
ACID-BASE EXCESS: 2 mmol/L (ref 0.0–2.0)
Acid-base deficit: 1 mmol/L (ref 0.0–2.0)
Acid-base deficit: 2 mmol/L (ref 0.0–2.0)
BICARBONATE: 26.6 mmol/L (ref 20.0–28.0)
BICARBONATE: 25.6 mmol/L (ref 20.0–28.0)
Bicarbonate: 24.9 mmol/L (ref 20.0–28.0)
Bicarbonate: 22.7 mmol/L (ref 20.0–28.0)
O2 SAT: 100 %
O2 SAT: 100 %
O2 Saturation: 99 %
O2 Saturation: 99 %
PH ART: 7.369 (ref 7.350–7.450)
Patient temperature: 36.2
Patient temperature: 36.6
TCO2: 28 mmol/L (ref 22–32)
TCO2: 27 mmol/L (ref 22–32)
TCO2: 26 mmol/L (ref 22–32)
TCO2: 24 mmol/L (ref 22–32)
pCO2 arterial: 43.1 mmHg (ref 32.0–48.0)
pCO2 arterial: 44.4 mmHg (ref 32.0–48.0)
pCO2 arterial: 42.1 mmHg (ref 32.0–48.0)
pCO2 arterial: 36.8 mmHg (ref 32.0–48.0)
pH, Arterial: 7.399 (ref 7.350–7.450)
pH, Arterial: 7.376 (ref 7.350–7.450)
pH, Arterial: 7.397 (ref 7.350–7.450)
pO2, Arterial: 310 mmHg — ABNORMAL HIGH (ref 83.0–108.0)
pO2, Arterial: 273 mmHg — ABNORMAL HIGH (ref 83.0–108.0)
pO2, Arterial: 120 mmHg — ABNORMAL HIGH (ref 83.0–108.0)
pO2, Arterial: 161 mmHg — ABNORMAL HIGH (ref 83.0–108.0)

## 2018-03-22 LAB — CBC
HCT: 34.7 % — ABNORMAL LOW (ref 39.0–52.0)
HCT: 30.6 % — ABNORMAL LOW (ref 39.0–52.0)
HCT: 30.9 % — ABNORMAL LOW (ref 39.0–52.0)
HEMOGLOBIN: 11.7 g/dL — AB (ref 13.0–17.0)
Hemoglobin: 10.3 g/dL — ABNORMAL LOW (ref 13.0–17.0)
Hemoglobin: 10.4 g/dL — ABNORMAL LOW (ref 13.0–17.0)
MCH: 29.1 pg (ref 26.0–34.0)
MCH: 29.2 pg (ref 26.0–34.0)
MCH: 29.2 pg (ref 26.0–34.0)
MCHC: 33.7 g/dL (ref 30.0–36.0)
MCHC: 33.7 g/dL (ref 30.0–36.0)
MCHC: 33.7 g/dL (ref 30.0–36.0)
MCV: 86.3 fL (ref 78.0–100.0)
MCV: 86.7 fL (ref 78.0–100.0)
MCV: 86.8 fL (ref 78.0–100.0)
PLATELETS: 233 10*3/uL (ref 150–400)
Platelets: 175 10*3/uL (ref 150–400)
Platelets: 210 10*3/uL (ref 150–400)
RBC: 4.02 MIL/uL — ABNORMAL LOW (ref 4.22–5.81)
RBC: 3.53 MIL/uL — ABNORMAL LOW (ref 4.22–5.81)
RBC: 3.56 MIL/uL — ABNORMAL LOW (ref 4.22–5.81)
RDW: 12.4 % (ref 11.5–15.5)
RDW: 12.2 % (ref 11.5–15.5)
RDW: 12.2 % (ref 11.5–15.5)
WBC: 7.5 10*3/uL (ref 4.0–10.5)
WBC: 17 10*3/uL — ABNORMAL HIGH (ref 4.0–10.5)
WBC: 16.3 10*3/uL — ABNORMAL HIGH (ref 4.0–10.5)

## 2018-03-22 LAB — PROTIME-INR
INR: 1.39
PROTHROMBIN TIME: 16.9 s — AB (ref 11.4–15.2)

## 2018-03-22 LAB — BASIC METABOLIC PANEL
Anion gap: 9 (ref 5–15)
BUN: 16 mg/dL (ref 8–23)
CO2: 25 mmol/L (ref 22–32)
Calcium: 9.4 mg/dL (ref 8.9–10.3)
Chloride: 104 mmol/L (ref 98–111)
Creatinine, Ser: 1.12 mg/dL (ref 0.61–1.24)
GFR calc Af Amer: >60 mL/min (ref >60)
GFR calc non Af Amer: >60 mL/min (ref >60)
Glucose, Bld: 107 mg/dL — ABNORMAL HIGH (ref 70–99)
Potassium: 4.1 mmol/L (ref 3.5–5.1)
Sodium: 138 mmol/L (ref 135–145)

## 2018-03-22 LAB — ECHOCARDIOGRAM COMPLETE
Height: 72 in
Weight: 3352 oz

## 2018-03-22 LAB — CULTURE, BLOOD (ROUTINE X 2)
CULTURE: NO GROWTH
Culture: NO GROWTH
SPECIAL REQUESTS: ADEQUATE

## 2018-03-22 LAB — GLUCOSE, CAPILLARY
Glucose-Capillary: 137 mg/dL — ABNORMAL HIGH (ref 70–99)
Glucose-Capillary: 136 mg/dL — ABNORMAL HIGH (ref 70–99)
Glucose-Capillary: 144 mg/dL — ABNORMAL HIGH (ref 70–99)
Glucose-Capillary: 134 mg/dL — ABNORMAL HIGH (ref 70–99)
Glucose-Capillary: 146 mg/dL — ABNORMAL HIGH (ref 70–99)
Glucose-Capillary: 162 mg/dL — ABNORMAL HIGH (ref 70–99)
Glucose-Capillary: 128 mg/dL — ABNORMAL HIGH (ref 70–99)
Glucose-Capillary: 124 mg/dL — ABNORMAL HIGH (ref 70–99)
Glucose-Capillary: 114 mg/dL — ABNORMAL HIGH (ref 70–99)

## 2018-03-22 LAB — COOXEMETRY PANEL
Carboxyhemoglobin: 1.3 % (ref 0.5–1.5)
Methemoglobin: 1.6 % — ABNORMAL HIGH (ref 0.0–1.5)
O2 Saturation: 54.2 %
Total hemoglobin: 10.6 g/dL — ABNORMAL LOW (ref 12.0–16.0)

## 2018-03-22 LAB — POCT I-STAT 4, (NA,K, GLUC, HGB,HCT)
Glucose, Bld: 161 mg/dL — ABNORMAL HIGH (ref 70–99)
HCT: 28 % — ABNORMAL LOW (ref 39.0–52.0)
Hemoglobin: 9.5 g/dL — ABNORMAL LOW (ref 13.0–17.0)
Potassium: 4.7 mmol/L (ref 3.5–5.1)
Sodium: 135 mmol/L (ref 135–145)

## 2018-03-22 LAB — CREATININE, SERUM
Creatinine, Ser: 1.12 mg/dL (ref 0.61–1.24)
GFR calc Af Amer: >60 mL/min (ref >60)
GFR calc non Af Amer: >60 mL/min (ref >60)

## 2018-03-22 LAB — PREPARE RBC (CROSSMATCH)

## 2018-03-22 LAB — APTT: APTT: 28 s (ref 24–36)

## 2018-03-22 LAB — HEMOGLOBIN AND HEMATOCRIT, BLOOD
HCT: 24.4 % — ABNORMAL LOW (ref 39.0–52.0)
Hemoglobin: 8.3 g/dL — ABNORMAL LOW (ref 13.0–17.0)

## 2018-03-22 LAB — HEPARIN LEVEL (UNFRACTIONATED): Heparin Unfractionated: 0.6 IU/mL (ref 0.30–0.70)

## 2018-03-22 LAB — PLATELET COUNT: Platelets: 200 10*3/uL (ref 150–400)

## 2018-03-22 LAB — MAGNESIUM: Magnesium: 3.3 mg/dL — ABNORMAL HIGH (ref 1.7–2.4)

## 2018-03-22 SURGERY — CORONARY ARTERY BYPASS GRAFTING (CABG)
Anesthesia: General | Site: Chest

## 2018-03-22 MED ORDER — ONDANSETRON HCL 4 MG/2ML IJ SOLN
INTRAMUSCULAR | Status: AC
  Filled 2018-03-22: qty 2

## 2018-03-22 MED ORDER — ACETAMINOPHEN 650 MG RE SUPP
650.0000 mg | Freq: Once | RECTAL | Status: AC
  Administered 2018-03-22: 650 mg via RECTAL

## 2018-03-22 MED ORDER — ACETAMINOPHEN 500 MG PO TABS
1000.0000 mg | ORAL_TABLET | Freq: Four times a day (QID) | ORAL | Status: AC
  Administered 2018-03-23 – 2018-03-27 (×17): 1000 mg via ORAL
  Filled 2018-03-22 (×16): qty 2

## 2018-03-22 MED ORDER — SODIUM CHLORIDE 0.9% FLUSH
3.0000 mL | INTRAVENOUS | Status: DC | PRN
  Administered 2018-03-23: 3 mL via INTRAVENOUS
  Filled 2018-03-22: qty 3

## 2018-03-22 MED ORDER — ACETAMINOPHEN 160 MG/5ML PO SOLN: 1000.0000 mg | Freq: Four times a day (QID) | ORAL | Status: AC

## 2018-03-22 MED ORDER — DEXAMETHASONE SODIUM PHOSPHATE 10 MG/ML IJ SOLN
INTRAMUSCULAR | Status: AC
  Filled 2018-03-22: qty 1

## 2018-03-22 MED ORDER — CALCIUM CHLORIDE 10 % IV SOLN
INTRAVENOUS | Status: DC | PRN
  Administered 2018-03-22 (×2): 100 mg via INTRAVENOUS

## 2018-03-22 MED ORDER — SODIUM CHLORIDE 0.9% FLUSH: 10.0000 mL | INTRAVENOUS | Status: DC | PRN

## 2018-03-22 MED ORDER — NOREPINEPHRINE 4 MG/250ML-% IV SOLN
0.0000 ug/min | INTRAVENOUS | Status: DC
  Administered 2018-03-22: 3 ug/min via INTRAVENOUS
  Administered 2018-03-23: 6 ug/min via INTRAVENOUS
  Administered 2018-03-23: 3 ug/min via INTRAVENOUS
  Filled 2018-03-22 (×3): qty 250

## 2018-03-22 MED ORDER — FENTANYL CITRATE (PF) 250 MCG/5ML IJ SOLN
INTRAMUSCULAR | Status: DC | PRN
  Administered 2018-03-22: 200 ug via INTRAVENOUS
  Administered 2018-03-22: 100 ug via INTRAVENOUS
  Administered 2018-03-22: 150 ug via INTRAVENOUS
  Administered 2018-03-22 (×3): 100 ug via INTRAVENOUS
  Administered 2018-03-22: 200 ug via INTRAVENOUS
  Administered 2018-03-22: 150 ug via INTRAVENOUS
  Administered 2018-03-22: 50 ug via INTRAVENOUS
  Administered 2018-03-22: 100 ug via INTRAVENOUS

## 2018-03-22 MED ORDER — PANTOPRAZOLE SODIUM 40 MG PO TBEC
40.0000 mg | DELAYED_RELEASE_TABLET | Freq: Every day | ORAL | Status: DC
  Administered 2018-03-24 – 2018-03-30 (×7): 40 mg via ORAL
  Filled 2018-03-22 (×8): qty 1

## 2018-03-22 MED ORDER — INSULIN REGULAR BOLUS VIA INFUSION
0.0000 [IU] | Freq: Three times a day (TID) | INTRAVENOUS | Status: DC
  Filled 2018-03-22: qty 10

## 2018-03-22 MED ORDER — METOPROLOL TARTRATE 12.5 MG HALF TABLET: 12.5000 mg | ORAL_TABLET | Freq: Two times a day (BID) | ORAL | Status: DC

## 2018-03-22 MED ORDER — SODIUM CHLORIDE 0.9 % IV SOLN
INTRAVENOUS | Status: DC | PRN
  Administered 2018-03-22: 2.3 [IU]/h via INTRAVENOUS
  Administered 2018-03-22: 1 [IU]/h via INTRAVENOUS

## 2018-03-22 MED ORDER — AMIODARONE HCL IN DEXTROSE 360-4.14 MG/200ML-% IV SOLN
30.0000 mg/h | INTRAVENOUS | Status: DC
  Administered 2018-03-22 – 2018-03-23 (×2): 30 mg/h via INTRAVENOUS
  Filled 2018-03-22: qty 200

## 2018-03-22 MED ORDER — ASPIRIN EC 325 MG PO TBEC
325.0000 mg | DELAYED_RELEASE_TABLET | Freq: Every day | ORAL | Status: DC
  Administered 2018-03-23 – 2018-03-30 (×8): 325 mg via ORAL
  Filled 2018-03-22 (×8): qty 1

## 2018-03-22 MED ORDER — NITROGLYCERIN IN D5W 200-5 MCG/ML-% IV SOLN: 0.0000 ug/min | INTRAVENOUS | Status: DC

## 2018-03-22 MED ORDER — MORPHINE SULFATE (PF) 2 MG/ML IV SOLN
1.0000 mg | INTRAVENOUS | Status: DC | PRN
  Administered 2018-03-22: 4 mg via INTRAVENOUS

## 2018-03-22 MED ORDER — PROTAMINE SULFATE 10 MG/ML IV SOLN
INTRAVENOUS | Status: AC
  Filled 2018-03-22: qty 5

## 2018-03-22 MED ORDER — TRANEXAMIC ACID 1000 MG/10ML IV SOLN: INTRAVENOUS | Status: DC | PRN

## 2018-03-22 MED ORDER — PROPOFOL 10 MG/ML IV BOLUS
INTRAVENOUS | Status: AC
  Filled 2018-03-22: qty 20

## 2018-03-22 MED ORDER — PROPOFOL 10 MG/ML IV BOLUS
INTRAVENOUS | Status: DC | PRN
  Administered 2018-03-22: 20 mg via INTRAVENOUS
  Administered 2018-03-22 (×2): 50 mg via INTRAVENOUS

## 2018-03-22 MED ORDER — ROCURONIUM BROMIDE 50 MG/5ML IV SOSY
PREFILLED_SYRINGE | INTRAVENOUS | Status: AC
  Filled 2018-03-22: qty 5

## 2018-03-22 MED ORDER — HEPARIN SODIUM (PORCINE) 1000 UNIT/ML IJ SOLN
INTRAMUSCULAR | Status: AC
  Filled 2018-03-22: qty 1

## 2018-03-22 MED ORDER — AMIODARONE HCL IN DEXTROSE 360-4.14 MG/200ML-% IV SOLN
60.0000 mg/h | INTRAVENOUS | Status: AC
  Administered 2018-03-22: 60 mg/h via INTRAVENOUS
  Filled 2018-03-22: qty 200

## 2018-03-22 MED ORDER — DEXMEDETOMIDINE HCL IN NACL 400 MCG/100ML IV SOLN
INTRAVENOUS | Status: DC | PRN
  Administered 2018-03-22: 0.7 ug/kg/h via INTRAVENOUS

## 2018-03-22 MED ORDER — NITROGLYCERIN 0.2 MG/ML ON CALL CATH LAB
INTRAVENOUS | Status: DC | PRN
  Administered 2018-03-22 (×2): 40 ug via INTRAVENOUS

## 2018-03-22 MED ORDER — LACTATED RINGERS IV SOLN
INTRAVENOUS | Status: DC | PRN
  Administered 2018-03-22 (×3): via INTRAVENOUS

## 2018-03-22 MED ORDER — PROTAMINE SULFATE 10 MG/ML IV SOLN
INTRAVENOUS | Status: AC
  Filled 2018-03-22: qty 25

## 2018-03-22 MED ORDER — 0.9 % SODIUM CHLORIDE (POUR BTL) OPTIME
TOPICAL | Status: DC | PRN
  Administered 2018-03-22: 5000 mL

## 2018-03-22 MED ORDER — SODIUM CHLORIDE 0.9% FLUSH
10.0000 mL | Freq: Two times a day (BID) | INTRAVENOUS | Status: DC
  Administered 2018-03-22 – 2018-03-29 (×8): 10 mL

## 2018-03-22 MED ORDER — FAMOTIDINE IN NACL 20-0.9 MG/50ML-% IV SOLN
20.0000 mg | Freq: Two times a day (BID) | INTRAVENOUS | Status: AC
  Administered 2018-03-22 (×2): 20 mg via INTRAVENOUS
  Filled 2018-03-22: qty 50

## 2018-03-22 MED ORDER — PHENYLEPHRINE HCL-NACL 20-0.9 MG/250ML-% IV SOLN
0.0000 ug/min | INTRAVENOUS | Status: DC
  Filled 2018-03-22: qty 250

## 2018-03-22 MED ORDER — PERFLUTREN LIPID MICROSPHERE
1.0000 mL | INTRAVENOUS | Status: AC | PRN
  Administered 2018-03-22: 2 mL via INTRAVENOUS
  Filled 2018-03-22: qty 10

## 2018-03-22 MED ORDER — DOPAMINE-DEXTROSE 3.2-5 MG/ML-% IV SOLN
2.0000 ug/kg/min | INTRAVENOUS | Status: DC
  Administered 2018-03-22: 3 ug/kg/min via INTRAVENOUS
  Administered 2018-03-24: 2 ug/kg/min via INTRAVENOUS
  Filled 2018-03-22: qty 250

## 2018-03-22 MED ORDER — SODIUM CHLORIDE 0.9 % IV SOLN
INTRAVENOUS | Status: DC
  Administered 2018-03-22 – 2018-03-24 (×2): via INTRAVENOUS

## 2018-03-22 MED ORDER — DOCUSATE SODIUM 100 MG PO CAPS
200.0000 mg | ORAL_CAPSULE | Freq: Every day | ORAL | Status: DC
  Administered 2018-03-23 – 2018-03-30 (×8): 200 mg via ORAL
  Filled 2018-03-22 (×8): qty 2

## 2018-03-22 MED ORDER — TRANEXAMIC ACID 1000 MG/10ML IV SOLN
INTRAVENOUS | Status: DC | PRN
  Administered 2018-03-22: 1.5 mg/kg/h via INTRAVENOUS

## 2018-03-22 MED ORDER — SODIUM CHLORIDE 0.9 % IV SOLN
1.5000 g | Freq: Two times a day (BID) | INTRAVENOUS | Status: AC
  Administered 2018-03-22 – 2018-03-24 (×4): 1.5 g via INTRAVENOUS
  Filled 2018-03-22 (×4): qty 1.5

## 2018-03-22 MED ORDER — OXYCODONE HCL 5 MG PO TABS
5.0000 mg | ORAL_TABLET | ORAL | Status: DC | PRN
  Administered 2018-03-23 – 2018-03-25 (×6): 10 mg via ORAL
  Filled 2018-03-22 (×6): qty 2

## 2018-03-22 MED ORDER — SODIUM CHLORIDE 0.9 % IV SOLN
INTRAVENOUS | Status: DC | PRN
  Administered 2018-03-22: 50 ug/min via INTRAVENOUS

## 2018-03-22 MED ORDER — SODIUM CHLORIDE 0.9 % IV SOLN: 250.0000 mL | INTRAVENOUS | Status: DC

## 2018-03-22 MED ORDER — ROCURONIUM BROMIDE 10 MG/ML (PF) SYRINGE
PREFILLED_SYRINGE | INTRAVENOUS | Status: DC | PRN
  Administered 2018-03-22: 30 mg via INTRAVENOUS
  Administered 2018-03-22: 20 mg via INTRAVENOUS
  Administered 2018-03-22: 50 mg via INTRAVENOUS
  Administered 2018-03-22 (×2): 20 mg via INTRAVENOUS
  Administered 2018-03-22: 50 mg via INTRAVENOUS
  Administered 2018-03-22: 20 mg via INTRAVENOUS
  Administered 2018-03-22: 30 mg via INTRAVENOUS

## 2018-03-22 MED ORDER — LACTATED RINGERS IV SOLN: 500.0000 mL | Freq: Once | INTRAVENOUS | Status: DC | PRN

## 2018-03-22 MED ORDER — MAGNESIUM SULFATE 4 GM/100ML IV SOLN
INTRAVENOUS | Status: AC
  Administered 2018-03-22: 4 g via INTRAVENOUS
  Filled 2018-03-22: qty 100

## 2018-03-22 MED ORDER — SODIUM CHLORIDE 0.9% FLUSH
3.0000 mL | Freq: Two times a day (BID) | INTRAVENOUS | Status: DC
  Administered 2018-03-23 – 2018-03-29 (×9): 3 mL via INTRAVENOUS

## 2018-03-22 MED ORDER — MIDAZOLAM HCL 2 MG/2ML IJ SOLN
2.0000 mg | INTRAMUSCULAR | Status: DC | PRN
  Administered 2018-03-22: 2 mg via INTRAVENOUS
  Filled 2018-03-22: qty 2

## 2018-03-22 MED ORDER — METOPROLOL TARTRATE 5 MG/5ML IV SOLN: 2.5000 mg | INTRAVENOUS | Status: DC | PRN

## 2018-03-22 MED ORDER — METOPROLOL TARTRATE 25 MG/10 ML ORAL SUSPENSION: 12.5000 mg | Freq: Two times a day (BID) | ORAL | Status: DC

## 2018-03-22 MED ORDER — ASPIRIN 81 MG PO CHEW
324.0000 mg | CHEWABLE_TABLET | Freq: Every day | ORAL | Status: DC
  Filled 2018-03-22 (×2): qty 4

## 2018-03-22 MED ORDER — SODIUM CHLORIDE 0.45 % IV SOLN
INTRAVENOUS | Status: DC | PRN
  Administered 2018-03-22: 13:00:00 via INTRAVENOUS

## 2018-03-22 MED ORDER — LIDOCAINE 2% (20 MG/ML) 5 ML SYRINGE
INTRAMUSCULAR | Status: AC
  Filled 2018-03-22: qty 5

## 2018-03-22 MED ORDER — BISACODYL 5 MG PO TBEC
10.0000 mg | DELAYED_RELEASE_TABLET | Freq: Every day | ORAL | Status: DC
  Administered 2018-03-23 – 2018-03-29 (×5): 10 mg via ORAL
  Filled 2018-03-22 (×6): qty 2

## 2018-03-22 MED ORDER — ALBUMIN HUMAN 5 % IV SOLN: INTRAVENOUS | Status: DC | PRN

## 2018-03-22 MED ORDER — FENTANYL CITRATE (PF) 250 MCG/5ML IJ SOLN
INTRAMUSCULAR | Status: AC
  Filled 2018-03-22: qty 25

## 2018-03-22 MED ORDER — CHLORHEXIDINE GLUCONATE CLOTH 2 % EX PADS
6.0000 | MEDICATED_PAD | Freq: Every day | CUTANEOUS | Status: DC
  Administered 2018-03-22 – 2018-03-29 (×8): 6 via TOPICAL

## 2018-03-22 MED ORDER — HEMOSTATIC AGENTS (NO CHARGE) OPTIME
TOPICAL | Status: DC | PRN
  Administered 2018-03-22 (×2): 1 via TOPICAL

## 2018-03-22 MED ORDER — VANCOMYCIN HCL IN DEXTROSE 1-5 GM/200ML-% IV SOLN
1000.0000 mg | Freq: Once | INTRAVENOUS | Status: DC
  Filled 2018-03-22: qty 200

## 2018-03-22 MED ORDER — CHLORHEXIDINE GLUCONATE 0.12 % MT SOLN
OROMUCOSAL | Status: AC
  Administered 2018-03-22: 15 mL via OROMUCOSAL
  Filled 2018-03-22: qty 15

## 2018-03-22 MED ORDER — MIDAZOLAM HCL 2 MG/2ML IJ SOLN
INTRAMUSCULAR | Status: AC
  Filled 2018-03-22: qty 2

## 2018-03-22 MED ORDER — BISACODYL 10 MG RE SUPP: 10.0000 mg | Freq: Every day | RECTAL | Status: DC

## 2018-03-22 MED ORDER — ONDANSETRON HCL 4 MG/2ML IJ SOLN: 4.0000 mg | Freq: Four times a day (QID) | INTRAMUSCULAR | Status: DC | PRN

## 2018-03-22 MED ORDER — LACTATED RINGERS IV SOLN
INTRAVENOUS | Status: DC
  Administered 2018-03-24: 06:00:00 via INTRAVENOUS

## 2018-03-22 MED ORDER — HEPARIN SODIUM (PORCINE) 1000 UNIT/ML IJ SOLN
INTRAMUSCULAR | Status: DC | PRN
  Administered 2018-03-22: 2000 [IU] via INTRAVENOUS
  Administered 2018-03-22: 27000 [IU] via INTRAVENOUS

## 2018-03-22 MED ORDER — SODIUM CHLORIDE 0.9 % IV SOLN
INTRAVENOUS | Status: DC
  Administered 2018-03-23: 4.4 [IU]/h via INTRAVENOUS
  Filled 2018-03-22 (×2): qty 1

## 2018-03-22 MED ORDER — TRAMADOL HCL 50 MG PO TABS
50.0000 mg | ORAL_TABLET | ORAL | Status: DC | PRN
  Administered 2018-03-27: 50 mg via ORAL
  Filled 2018-03-22: qty 2
  Filled 2018-03-22: qty 1

## 2018-03-22 MED ORDER — MORPHINE SULFATE (PF) 2 MG/ML IV SOLN
2.0000 mg | INTRAVENOUS | Status: DC | PRN
  Administered 2018-03-23: 2 mg via INTRAVENOUS
  Administered 2018-03-23: 4 mg via INTRAVENOUS
  Administered 2018-03-23 (×2): 2 mg via INTRAVENOUS
  Filled 2018-03-22: qty 2
  Filled 2018-03-22 (×2): qty 1
  Filled 2018-03-22: qty 2
  Filled 2018-03-22: qty 1

## 2018-03-22 MED ORDER — DEXMEDETOMIDINE HCL IN NACL 400 MCG/100ML IV SOLN
0.4000 ug/kg/h | INTRAVENOUS | Status: DC
  Administered 2018-03-22 – 2018-03-23 (×2): 0.7 ug/kg/h via INTRAVENOUS
  Filled 2018-03-22 (×2): qty 100

## 2018-03-22 MED ORDER — MILRINONE LACTATE IN DEXTROSE 20-5 MG/100ML-% IV SOLN
INTRAVENOUS | Status: DC | PRN
  Administered 2018-03-22: 0.25 ug/kg/min via INTRAVENOUS

## 2018-03-22 MED ORDER — MILRINONE LACTATE IN DEXTROSE 20-5 MG/100ML-% IV SOLN
0.1250 ug/kg/min | INTRAVENOUS | Status: DC
  Administered 2018-03-22 – 2018-03-23 (×2): 0.125 ug/kg/min via INTRAVENOUS
  Administered 2018-03-24 – 2018-03-26 (×4): 0.25 ug/kg/min via INTRAVENOUS
  Administered 2018-03-27: 0.125 ug/kg/min via INTRAVENOUS
  Filled 2018-03-22 (×7): qty 100

## 2018-03-22 MED ORDER — MIDAZOLAM HCL 5 MG/5ML IJ SOLN
INTRAMUSCULAR | Status: DC | PRN
  Administered 2018-03-22 (×6): 2 mg via INTRAVENOUS

## 2018-03-22 MED ORDER — ORAL CARE MOUTH RINSE
15.0000 mL | OROMUCOSAL | Status: DC
  Administered 2018-03-22 – 2018-03-23 (×6): 15 mL via OROMUCOSAL

## 2018-03-22 MED ORDER — CALCIUM CHLORIDE 10 % IV SOLN
0.5000 g | Freq: Once | INTRAVENOUS | Status: AC
  Administered 2018-03-22: 0.5 g via INTRAVENOUS

## 2018-03-22 MED ORDER — SUCCINYLCHOLINE CHLORIDE 200 MG/10ML IV SOSY
PREFILLED_SYRINGE | INTRAVENOUS | Status: AC
  Filled 2018-03-22: qty 10

## 2018-03-22 MED ORDER — CHLORHEXIDINE GLUCONATE 0.12 % MT SOLN
15.0000 mL | OROMUCOSAL | Status: AC
  Administered 2018-03-22: 15 mL via OROMUCOSAL

## 2018-03-22 MED ORDER — NOREPINEPHRINE 4 MG/250ML-% IV SOLN
0.0000 ug/min | INTRAVENOUS | Status: DC
  Filled 2018-03-22: qty 250

## 2018-03-22 MED ORDER — POTASSIUM CHLORIDE 10 MEQ/50ML IV SOLN: 10.0000 meq | INTRAVENOUS | Status: AC

## 2018-03-22 MED ORDER — NOREPINEPHRINE BITARTRATE 1 MG/ML IV SOLN
INTRAVENOUS | Status: DC | PRN
  Administered 2018-03-22: 4 ug/min via INTRAVENOUS

## 2018-03-22 MED ORDER — HEMOSTATIC AGENTS (NO CHARGE) OPTIME
TOPICAL | Status: DC | PRN
  Administered 2018-03-22: 1 via TOPICAL

## 2018-03-22 MED ORDER — PROTAMINE SULFATE 10 MG/ML IV SOLN
INTRAVENOUS | Status: DC | PRN
  Administered 2018-03-22: 50 mg via INTRAVENOUS
  Administered 2018-03-22: 25 mg via INTRAVENOUS
  Administered 2018-03-22 (×3): 50 mg via INTRAVENOUS
  Administered 2018-03-22: 25 mg via INTRAVENOUS
  Administered 2018-03-22: 50 mg via INTRAVENOUS

## 2018-03-22 MED ORDER — ARTIFICIAL TEARS OPHTHALMIC OINT
TOPICAL_OINTMENT | OPHTHALMIC | Status: DC | PRN
  Administered 2018-03-22: 1 via OPHTHALMIC

## 2018-03-22 MED ORDER — ALBUMIN HUMAN 5 % IV SOLN
250.0000 mL | INTRAVENOUS | Status: DC | PRN
  Administered 2018-03-22 (×4): 12.5 g via INTRAVENOUS
  Filled 2018-03-22 (×2): qty 250

## 2018-03-22 MED ORDER — SODIUM CHLORIDE 0.9 % IV SOLN
INTRAVENOUS | Status: DC | PRN
  Administered 2018-03-22: 25 ug/min via INTRAVENOUS

## 2018-03-22 MED ORDER — DEXMEDETOMIDINE HCL IN NACL 200 MCG/50ML IV SOLN
0.0000 ug/kg/h | INTRAVENOUS | Status: DC
  Administered 2018-03-22: 0.3 ug/kg/h via INTRAVENOUS
  Filled 2018-03-22: qty 50

## 2018-03-22 MED ORDER — ACETAMINOPHEN 160 MG/5ML PO SOLN: 650.0000 mg | Freq: Once | ORAL | Status: AC

## 2018-03-22 MED ORDER — VANCOMYCIN HCL IN DEXTROSE 1-5 GM/200ML-% IV SOLN
1000.0000 mg | Freq: Two times a day (BID) | INTRAVENOUS | Status: AC
  Administered 2018-03-22 – 2018-03-23 (×2): 1000 mg via INTRAVENOUS
  Filled 2018-03-22 (×2): qty 200

## 2018-03-22 MED ORDER — CHLORHEXIDINE GLUCONATE 0.12% ORAL RINSE (MEDLINE KIT)
15.0000 mL | Freq: Two times a day (BID) | OROMUCOSAL | Status: DC
  Administered 2018-03-22 – 2018-03-23 (×2): 15 mL via OROMUCOSAL

## 2018-03-22 MED ORDER — LACTATED RINGERS IV SOLN: INTRAVENOUS | Status: DC

## 2018-03-22 MED ORDER — AMIODARONE HCL IN DEXTROSE 360-4.14 MG/200ML-% IV SOLN
INTRAVENOUS | Status: DC | PRN
  Administered 2018-03-22: 60 mg/h via INTRAVENOUS

## 2018-03-22 MED ORDER — MIDAZOLAM HCL 10 MG/2ML IJ SOLN
INTRAMUSCULAR | Status: AC
  Filled 2018-03-22: qty 2

## 2018-03-22 MED ORDER — MAGNESIUM SULFATE 4 GM/100ML IV SOLN
4.0000 g | Freq: Once | INTRAVENOUS | Status: AC
  Administered 2018-03-22: 4 g via INTRAVENOUS

## 2018-03-22 SURGICAL SUPPLY — 101 items
ADAPTER CARDIO PERF ANTE/RETRO (ADAPTER) ×4 IMPLANT
ARTICLIP LAA PROCLIP II 40 (Clip) ×3 IMPLANT
ARTICLIP LAA PROCLIP II 40MM (Clip) ×1 IMPLANT
BAG DECANTER FOR FLEXI CONT (MISCELLANEOUS) ×4 IMPLANT
BANDAGE ACE 4X5 VEL STRL LF (GAUZE/BANDAGES/DRESSINGS) ×4 IMPLANT
BANDAGE ACE 6X5 VEL STRL LF (GAUZE/BANDAGES/DRESSINGS) ×4 IMPLANT
BASKET HEART  (ORDER IN 25'S) (MISCELLANEOUS) ×1
BASKET HEART (ORDER IN 25'S) (MISCELLANEOUS) ×1
BASKET HEART (ORDER IN 25S) (MISCELLANEOUS) ×2 IMPLANT
BLADE CLIPPER SURG (BLADE) IMPLANT
BLADE STERNUM SYSTEM 6 (BLADE) ×4 IMPLANT
BLADE SURG 12 STRL SS (BLADE) ×4 IMPLANT
BNDG GAUZE ELAST 4 BULKY (GAUZE/BANDAGES/DRESSINGS) ×4 IMPLANT
CANISTER SUCT 3000ML PPV (MISCELLANEOUS) ×4 IMPLANT
CANNULA GUNDRY RCSP 15FR (MISCELLANEOUS) ×4 IMPLANT
CATH CPB KIT VANTRIGT (MISCELLANEOUS) ×4 IMPLANT
CATH ROBINSON RED A/P 18FR (CATHETERS) ×12 IMPLANT
CATH THORACIC 36FR RT ANG (CATHETERS) ×4 IMPLANT
CRADLE DONUT ADULT HEAD (MISCELLANEOUS) ×4 IMPLANT
DERMABOND ADVANCED (GAUZE/BANDAGES/DRESSINGS) ×2
DERMABOND ADVANCED .7 DNX12 (GAUZE/BANDAGES/DRESSINGS) ×2 IMPLANT
DEVICE ATRICLIP LAA PRCLPII 40 (Clip) ×2 IMPLANT
DRAIN CHANNEL 32F RND 10.7 FF (WOUND CARE) ×4 IMPLANT
DRAPE CARDIOVASCULAR INCISE (DRAPES) ×2
DRAPE SLUSH/WARMER DISC (DRAPES) ×4 IMPLANT
DRAPE SRG 135X102X78XABS (DRAPES) ×2 IMPLANT
DRSG AQUACEL AG ADV 3.5X14 (GAUZE/BANDAGES/DRESSINGS) ×4 IMPLANT
ELECT BLADE 4.0 EZ CLEAN MEGAD (MISCELLANEOUS) ×4
ELECT BLADE 6.5 EXT (BLADE) ×4 IMPLANT
ELECT CAUTERY BLADE 6.4 (BLADE) ×4 IMPLANT
ELECT REM PT RETURN 9FT ADLT (ELECTROSURGICAL) ×8
ELECTRODE BLDE 4.0 EZ CLN MEGD (MISCELLANEOUS) ×2 IMPLANT
ELECTRODE REM PT RTRN 9FT ADLT (ELECTROSURGICAL) ×4 IMPLANT
FELT TEFLON 1X6 (MISCELLANEOUS) ×4 IMPLANT
GAUZE SPONGE 4X4 12PLY STRL (GAUZE/BANDAGES/DRESSINGS) ×4 IMPLANT
GAUZE SPONGE 4X4 12PLY STRL LF (GAUZE/BANDAGES/DRESSINGS) ×4 IMPLANT
GLOVE BIO SURGEON STRL SZ 6.5 (GLOVE) ×21 IMPLANT
GLOVE BIO SURGEON STRL SZ7.5 (GLOVE) ×20 IMPLANT
GLOVE BIO SURGEONS STRL SZ 6.5 (GLOVE) ×7
GLOVE BIOGEL PI IND STRL 6.5 (GLOVE) ×12 IMPLANT
GLOVE BIOGEL PI INDICATOR 6.5 (GLOVE) ×12
GOWN STRL REUS W/ TWL LRG LVL3 (GOWN DISPOSABLE) ×20 IMPLANT
GOWN STRL REUS W/TWL LRG LVL3 (GOWN DISPOSABLE) ×20
HEMOSTAT POWDER SURGIFOAM 1G (HEMOSTASIS) IMPLANT
HEMOSTAT SURGICEL 2X14 (HEMOSTASIS) ×4 IMPLANT
INSERT FOGARTY XLG (MISCELLANEOUS) IMPLANT
KIT BASIN OR (CUSTOM PROCEDURE TRAY) ×4 IMPLANT
KIT SUCTION CATH 14FR (SUCTIONS) ×4 IMPLANT
KIT TURNOVER KIT B (KITS) ×4 IMPLANT
KIT VASOVIEW HEMOPRO VH 3000 (KITS) ×4 IMPLANT
LEAD PACING MYOCARDI (MISCELLANEOUS) ×4 IMPLANT
LIGACLIP SM TITANIUM (CLIP) ×8 IMPLANT
MARKER GRAFT CORONARY BYPASS (MISCELLANEOUS) ×8 IMPLANT
NS IRRIG 1000ML POUR BTL (IV SOLUTION) ×20 IMPLANT
PACK E OPEN HEART (SUTURE) ×4 IMPLANT
PACK OPEN HEART (CUSTOM PROCEDURE TRAY) ×4 IMPLANT
PAD ARMBOARD 7.5X6 YLW CONV (MISCELLANEOUS) ×8 IMPLANT
PAD ELECT DEFIB RADIOL ZOLL (MISCELLANEOUS) ×4 IMPLANT
PENCIL BUTTON HOLSTER BLD 10FT (ELECTRODE) ×4 IMPLANT
POWDER SURGICEL 3.0 GRAM (HEMOSTASIS) ×8 IMPLANT
PUNCH AORTIC ROTATE  4.5MM 8IN (MISCELLANEOUS) ×4 IMPLANT
PUNCH AORTIC ROTATE 4.0MM (MISCELLANEOUS) IMPLANT
PUNCH AORTIC ROTATE 4.5MM 8IN (MISCELLANEOUS) IMPLANT
PUNCH AORTIC ROTATE 5MM 8IN (MISCELLANEOUS) IMPLANT
SET CARDIOPLEGIA MPS 5001102 (MISCELLANEOUS) ×4 IMPLANT
SPONGE LAP 18X18 X RAY DECT (DISPOSABLE) ×4 IMPLANT
SPONGE LAP 4X18 RFD (DISPOSABLE) ×4 IMPLANT
SURGIFLO W/THROMBIN 8M KIT (HEMOSTASIS) ×4 IMPLANT
SUT BONE WAX W31G (SUTURE) ×4 IMPLANT
SUT MNCRL AB 4-0 PS2 18 (SUTURE) IMPLANT
SUT PROLENE 3 0 SH DA (SUTURE) IMPLANT
SUT PROLENE 3 0 SH1 36 (SUTURE) IMPLANT
SUT PROLENE 4 0 RB 1 (SUTURE) ×2
SUT PROLENE 4 0 SH DA (SUTURE) ×4 IMPLANT
SUT PROLENE 4-0 RB1 .5 CRCL 36 (SUTURE) ×2 IMPLANT
SUT PROLENE 5 0 C 1 36 (SUTURE) IMPLANT
SUT PROLENE 6 0 C 1 30 (SUTURE) ×16 IMPLANT
SUT PROLENE 6 0 CC (SUTURE) ×12 IMPLANT
SUT PROLENE 8 0 BV175 6 (SUTURE) IMPLANT
SUT PROLENE BLUE 7 0 (SUTURE) ×8 IMPLANT
SUT SILK  1 MH (SUTURE)
SUT SILK 1 MH (SUTURE) IMPLANT
SUT SILK 2 0 SH CR/8 (SUTURE) ×4 IMPLANT
SUT SILK 3 0 SH CR/8 (SUTURE) IMPLANT
SUT STEEL 6MS V (SUTURE) ×4 IMPLANT
SUT STEEL SZ 6 DBL 3X14 BALL (SUTURE) ×4 IMPLANT
SUT VIC AB 1 CTX 36 (SUTURE) ×4
SUT VIC AB 1 CTX36XBRD ANBCTR (SUTURE) ×4 IMPLANT
SUT VIC AB 2-0 CT1 27 (SUTURE) ×2
SUT VIC AB 2-0 CT1 TAPERPNT 27 (SUTURE) ×2 IMPLANT
SUT VIC AB 2-0 CTX 27 (SUTURE) IMPLANT
SUT VIC AB 3-0 X1 27 (SUTURE) ×4 IMPLANT
SYSTEM SAHARA CHEST DRAIN ATS (WOUND CARE) ×4 IMPLANT
TAPE CLOTH SURG 4X10 WHT LF (GAUZE/BANDAGES/DRESSINGS) ×8 IMPLANT
TAPE PAPER 2X10 WHT MICROPORE (GAUZE/BANDAGES/DRESSINGS) ×4 IMPLANT
TOWEL GREEN STERILE (TOWEL DISPOSABLE) ×4 IMPLANT
TOWEL GREEN STERILE FF (TOWEL DISPOSABLE) ×4 IMPLANT
TRAY FOLEY SLVR 16FR TEMP STAT (SET/KITS/TRAYS/PACK) ×4 IMPLANT
TUBING INSUFFLATION (TUBING) ×4 IMPLANT
UNDERPAD 30X30 (UNDERPADS AND DIAPERS) ×4 IMPLANT
WATER STERILE IRR 1000ML POUR (IV SOLUTION) ×8 IMPLANT

## 2018-03-22 NOTE — Progress Notes (Signed)
TCTS BRIEF SICU PROGRESS NOTE  Day of Surgery  S/P Procedure(s) (LRB): CORONARY ARTERY BYPASS GRAFTING (CABG) x 3 WITH ENDOSCOPIC HARVESTING OF RIGHT GREATER SAPHENOUS VEIN (N/A) TRANSESOPHAGEAL ECHOCARDIOGRAM (TEE) (N/A) LEFT ATRIAL APPENDAGE OCCLUSION USING 40 MM ATRICURE ATRICLIP (N/A)   Cardiac output/index decreased last hour but now improved Currently AV paced w/ stable hemodynamics and cardiac index 2.3 on low dose milrinone w/ levophed and Neo for BP support O2 sats 100% w/ FiO2 50% Chest tube output low UOP 45-60 mL/hr Labs okay w/ mixed venous coox 54% EKG w/ accel junctional rhythm, old inferior MI, no acute ST changes ECHO ordered earlier, pending  Plan: Continue current plan.  Follow up ECHO results.  Continue routine  Rexene Alberts, MD 03/22/2018 5:57 PM

## 2018-03-22 NOTE — Discharge Summary (Signed)
[facility] [place] NAME: David Mccullough    MR#:  [id number]  DATE OF BIRTH:  [birth year]  DATE OF ADMISSION:  03/14/2018 ADMITTING PHYSICIAN: Demetrios Loll, MD  DATE OF DISCHARGE: 03/16/2018 12:34 PM  PRIMARY CARE PHYSICIAN: Patient, No Pcp Per    ADMISSION DIAGNOSIS:  Diaphoresis [R61] New onset a-fib ([place]) [I48.91] NSTEMI (non-ST elevated myocardial infarction) ([place]) [I21.4] Chest pain, unspecified type [R07.9]  DISCHARGE DIAGNOSIS:  Active Problems:   NSTEMI (non-ST elevated myocardial infarction) ([place])   SECONDARY DIAGNOSIS:   Past Medical History:  Diagnosis Date  . CAD (coronary artery disease)   . Persistent atrial fibrillation ([place])   . Tachycardia     HOSPITAL COURSE:   * Non-STEMI Telemetry monitor, follow-up troponin, continue heparin drip, aspirin, start Lipitor, check lipid panel- LDL > 100, echocardiograph and cardiology consult for cardiac cath.   Prox LAD lesion is 80% stenosed.  1st Mrg lesion is 99% stenosed.  Prox RCA lesion is 100% stenosed.   Conclusion Unsuccessful PCI and stent of OM1 failure across the lesion with wire Consider transfer the patient to tertiary care center for possible attempted intervention Patient vessel went from TIMI I to TIMI 0 flow Patient remained hemodynamically stable Anticoagulation with Angiomax was discontinued he has been loaded with aspirin and Plavix  Recommend uninterrupted dual antiplatelet therapy with Aspirin 81mg  daily and Clopidogrel 75mg  daily for a minimum of 12 months (ACS - Class I recommendation).  Pt was transferred to [facility] for further interventions.  * New onset A. fib. Heart rate is controlled. Continue heparin drip, start low-dose Lopressor. Follow-up echocardiograph.  * Mild dehydration. Gentle IV fluid support.  DISCHARGE CONDITIONS:   Stable.  CONSULTS OBTAINED:  Treatment Team:  Yolonda Kida, MD  DRUG ALLERGIES:   No Known Allergies  DISCHARGE MEDICATIONS:   Allergies as of 03/16/2018   No Known Allergies     Medication List    TAKE these medications   aspirin 81 MG chewable tablet Chew 81 mg by mouth at bedtime.   atorvastatin 40 MG tablet Commonly known as:  LIPITOR Take 1 tablet (40 mg total) by mouth daily at 6 PM.   CALCIUM 1000 + D PO Take 1 tablet by mouth daily.   clopidogrel 75 MG tablet Commonly known as:  PLAVIX Take 1 tablet (75 mg total) by mouth daily with breakfast.   clopidogrel 300 MG Tabs tablet Commonly known as:  PLAVIX Take 2 tablets (600 mg total) by mouth before cath procedure.   Fish Oil 1200 MG Caps Take 1 capsule by mouth daily.   heparin 100-0.45 UNIT/ML-% infusion Inject 1,500 Units/hr into the vein continuous.   metoprolol tartrate 25 MG tablet Commonly known as:  LOPRESSOR Take 0.5 tablets (12.5 mg total) by mouth 2 (two) times daily.   multivitamin tablet Take 1 tablet by mouth daily.   nitroGLYCERIN 0.4 MG SL tablet Commonly known as:  NITROSTAT Place 1 tablet (0.4 mg total) under the tongue every 5 (five) minutes x 3 doses as needed for chest pain.   traZODone 50 MG tablet Commonly known as:  DESYREL Take 50 mg by mouth at bedtime.        DISCHARGE INSTRUCTIONS:    Follow as advised by cardiologist.  If you experience worsening of your admission symptoms, develop shortness of breath, life threatening emergency, suicidal or homicidal thoughts you must seek medical attention immediately by calling 911 or calling your MD immediately  if symptoms less severe.  You Must read complete instructions/literature along with all the possible adverse reactions/side effects for all the Medicines you take and that have been prescribed to you. Take any new Medicines after you have completely understood and accept all the possible adverse reactions/side effects.   Please note  You were cared for by a hospitalist during your hospital stay. If you  have any questions about your discharge medications or the care you received while you were in the hospital after you are discharged, you can call the unit and asked to speak with the hospitalist on call if the hospitalist that took care of you is not available. Once you are discharged, your primary care physician will handle any further medical issues. Please note that NO REFILLS for any discharge medications will be authorized once you are discharged, as it is imperative that you return to your primary care physician (or establish a relationship with a primary care physician if you do not have one) for your aftercare needs so that they can reassess your need for medications and monitor your lab values.    Today   CHIEF COMPLAINT:   Chief Complaint  Patient presents with  . Chest Pain    HISTORY OF PRESENT ILLNESS:  David Mccullough  is a 72 y.o. 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  The patient started to have chest pain yesterday, which is in substernal area, tight without radiation.  The patient denies any headache or dizziness, no nausea or diaphoresis.  Troponin is elevated at 0.48.  EKG show A. fib.   VITAL SIGNS:  Blood pressure (!) 88/70, pulse 76, temperature 97.8 F (36.6 C), temperature source Oral, resp. rate 13, height 6' (1.829 m), weight 95.1 kg, SpO2 95 %.  I/O:  No intake or output data in the 24 hours ending 03/22/18 0746  PHYSICAL EXAMINATION:  GENERAL:  72 y.o.-year-old patient lying in the bed with no acute distress.  EYES: Pupils equal, round, reactive to light and accommodation. No scleral icterus. Extraocular muscles intact.  HEENT: Head atraumatic, normocephalic. Oropharynx and nasopharynx clear.  NECK:  Supple, no jugular venous distention. No thyroid enlargement, no tenderness.  LUNGS: Normal breath sounds bilaterally, no wheezing, rales,rhonchi or crepitation. No use of accessory muscles of respiration.   CARDIOVASCULAR: S1, S2 normal. No murmurs, rubs, or gallops.  ABDOMEN: Soft, non-tender, non-distended. Bowel sounds present. No organomegaly or mass.  EXTREMITIES: No pedal edema, cyanosis, or clubbing.  NEUROLOGIC: Cranial nerves II through XII are intact. Muscle strength 5/5 in all extremities. Sensation intact. Gait not checked.  PSYCHIATRIC: The patient is alert and oriented x 3.  SKIN: No obvious rash, lesion, or ulcer.   DATA REVIEW:   CBC Recent Labs  Lab 03/22/18 0414  WBC 7.5  HGB 11.7*  HCT 34.7*  PLT 233    Chemistries  Recent Labs  Lab 03/17/18 0446  03/22/18 0414  NA 137   < > 138  K 3.7   < > 4.1  CL 105   < > 104  CO2 24   < > 25  GLUCOSE 120*   < > 107*  BUN 13   < > 16  CREATININE 1.05   < > 1.12  CALCIUM 9.2   < > 9.4  AST 125*  --   --   ALT 33  --   --   ALKPHOS 47  --   --   BILITOT  1.2  --   --    < > = values in this interval not displayed.    Cardiac Enzymes No results for input(s): TROPONINI in the last 168 hours.  Microbiology Results  No results found for this or any previous visit.  RADIOLOGY:  No results found.  EKG:   Orders placed or performed during the hospital encounter of 03/14/18  . ED EKG within 10 minutes  . ED EKG within 10 minutes  . EKG 12-Lead  . EKG 12-Lead  . EKG      Management plans discussed with the patient, family and they are in agreement.  CODE STATUS: Full Code Status History    Date Active Date Inactive Code Status Order ID Comments User Context   03/14/2018 1203 03/16/2018 1309 Full Code [id number]  Demetrios Loll, MD ED      TOTAL TIME TAKING CARE OF THIS PATIENT: 35 minutes.    Vaughan Basta M.D on 03/22/2018 at 7:46 AM  Between 7am to 6pm - Pager - [phone]  After 6pm go to [email or web address] - password [email or web address] [place] [facility]  Office  [phone]  CC: Primary care physician; Patient, No Pcp Per   Note: This dictation was prepared with Dragon dictation along  with smaller phrase technology. Any transcriptional errors that result from this process are unintentional.

## 2018-03-22 NOTE — Plan of Care (Signed)
  Problem: Activity: Goal: Risk for activity intolerance will decrease Outcome: Progressing   Problem: Cardiac: Goal: Will achieve and/or maintain hemodynamic stability Outcome: Progressing   Problem: Clinical Measurements: Goal: Postoperative complications will be avoided or minimized Outcome: Progressing   Problem: Respiratory: Goal: Respiratory status will improve Outcome: Progressing   Problem: Skin Integrity: Goal: Wound healing without signs and symptoms of infection Outcome: Progressing Goal: Risk for impaired skin integrity will decrease Outcome: Progressing   Problem: Urinary Elimination: Goal: Ability to achieve and maintain adequate renal perfusion and functioning will improve Outcome: Progressing   Problem: Education: Goal: Will demonstrate proper wound care and an understanding of methods to prevent future damage Outcome: Not Progressing Goal: Knowledge of disease or condition will improve Outcome: Not Progressing Goal: Knowledge of the prescribed therapeutic regimen will improve Outcome: Not Progressing Goal: Individualized Educational Video(s) Outcome: Not Progressing

## 2018-03-22 NOTE — Progress Notes (Signed)
  Echocardiogram 2D Echocardiogram has been performed.  Jennette Dubin 03/22/2018, 8:46 AM

## 2018-03-22 NOTE — Progress Notes (Signed)
Dr. Prescott Gum at bedside repositioning swan. New length now at 3.   Verbal orders to not wean Levo until SBP >100, Neo <40 and CI >2. Levo gtt currently at 86mcg, Neo gtt at 30mcg, and most recent CI 1.76.

## 2018-03-22 NOTE — Progress Notes (Signed)
Pre Procedure note for inpatients:   David Mccullough has been scheduled for Procedure(s): CORONARY ARTERY BYPASS GRAFTING (CABG) (N/A) TRANSESOPHAGEAL ECHOCARDIOGRAM (TEE) (N/A) today. The various methods of treatment have been discussed with the patient. After consideration of the risks, benefits and treatment options the patient has consented to the planned procedure.   The patient has been seen and labs reviewed. There are no changes in the patient's condition to prevent proceeding with the planned procedure today.  Recent labs:  Lab Results  Component Value Date   WBC 7.5 03/22/2018   HGB 11.7 (L) 03/22/2018   HCT 34.7 (L) 03/22/2018   PLT 233 03/22/2018   GLUCOSE 107 (H) 03/22/2018   CHOL 158 03/17/2018   TRIG 111 03/17/2018   HDL 36 (L) 03/17/2018   LDLCALC 100 (H) 03/17/2018   ALT 33 03/17/2018   AST 125 (H) 03/17/2018   NA 138 03/22/2018   K 4.1 03/22/2018   CL 104 03/22/2018   CREATININE 1.12 03/22/2018   BUN 16 03/22/2018   CO2 25 03/22/2018   TSH 1.977 03/17/2018   INR 1.10 03/17/2018   HGBA1C 5.2 03/17/2018    Len Childs, MD 03/22/2018 7:28 AM

## 2018-03-22 NOTE — Transfer of Care (Signed)
Immediate Anesthesia Transfer of Care Note  Patient: David Mccullough  Procedure(s) Performed: CORONARY ARTERY BYPASS GRAFTING (CABG) x 3 WITH ENDOSCOPIC HARVESTING OF RIGHT GREATER SAPHENOUS VEIN (N/A Chest) TRANSESOPHAGEAL ECHOCARDIOGRAM (TEE) (N/A ) LEFT ATRIAL APPENDAGE OCCLUSION USING 40 MM ATRICURE ATRICLIP (N/A )  Patient Location: ICU  Anesthesia Type:General  Level of Consciousness: Patient remains intubated per anesthesia plan  Airway & Oxygen Therapy: Patient remains intubated per anesthesia plan and Patient placed on Ventilator (see vital sign flow sheet for setting)  Post-op Assessment: Post -op Vital signs reviewed and stable  Post vital signs: stable  Last Vitals:  Vitals Value Taken Time  BP 112/74 03/22/2018  1:27 PM  Temp 36.2 C 03/22/2018  1:28 PM  Pulse 83 03/22/2018  1:28 PM  Resp 20 03/22/2018  1:28 PM  SpO2 100 % 03/22/2018  1:28 PM  Vitals shown include unvalidated device data.  Last Pain:  Vitals:   03/22/18 0411  TempSrc: Oral  PainSc:          Complications: No apparent anesthesia complications

## 2018-03-22 NOTE — Progress Notes (Signed)
1700: Dr Prescott Gum paged regarding patients current condition including vital signs, CO/CI, current gtt rates, and UOP.  Orders to inc epicardial pacer to DDD @ 90, transduce CVP (14), get Coox, and start dopamine.   1715: Give 0.5 mg Calcium. Get EKG.   1730: Dr. Burt Knack at bedside to assess patient.

## 2018-03-22 NOTE — Anesthesia Procedure Notes (Signed)
Central Venous Catheter Insertion Performed by: Nolon Nations, MD, anesthesiologist Start/End9/24/2019 6:35 AM, 03/22/2018 6:40 AM Patient location: Pre-op. Preanesthetic checklist: patient identified, IV checked, site marked, risks and benefits discussed, surgical consent, monitors and equipment checked, pre-op evaluation, timeout performed and anesthesia consent Hand hygiene performed  and maximum sterile barriers used  PA cath was placed.Swan type:thermodilution PA Cath depth:42 Procedure performed without using ultrasound guided technique. Attempts: 1 Post procedure assessment: no air and free fluid flow  Patient tolerated the procedure well with no immediate complications.

## 2018-03-22 NOTE — Anesthesia Procedure Notes (Signed)
Arterial Line Insertion Start/End9/24/2019 6:45 AM, 03/22/2018 6:55 AM Performed by: Lavell Luster, CRNA, CRNA  Patient location: Pre-op. Preanesthetic checklist: patient identified, IV checked, site marked, risks and benefits discussed, surgical consent, monitors and equipment checked, pre-op evaluation and timeout performed Lidocaine 1% used for infiltration and patient sedated Left, radial was placed Catheter size: 20 G Hand hygiene performed , maximum sterile barriers used  and Seldinger technique used Allen's test indicative of satisfactory collateral circulation Attempts: 1 Procedure performed without using ultrasound guided technique. Following insertion, Biopatch and dressing applied. Post procedure assessment: normal  Patient tolerated the procedure well with no immediate complications.

## 2018-03-22 NOTE — Progress Notes (Signed)
Progress Note  Patient Name: David Mccullough Date of Encounter: 03/22/2018  Primary Cardiologist: No primary care provider on file.   Subjective   Intubated/sedated. Asked to evaluate patient POD #0 from CABG with worsening shock, CI 1.4, on multiple pressors. Possible need for emergent cath and/or hemodynamic support.   Inpatient Medications    Scheduled Meds: . [START ON 03/23/2018] acetaminophen  1,000 mg Oral Q6H   Or  . [START ON 03/23/2018] acetaminophen (TYLENOL) oral liquid 160 mg/5 mL  1,000 mg Per Tube Q6H  . [START ON 03/23/2018] aspirin EC  325 mg Oral Daily   Or  . [START ON 03/23/2018] aspirin  324 mg Per Tube Daily  . atorvastatin  80 mg Oral q1800  . [START ON 03/23/2018] bisacodyl  10 mg Oral Daily   Or  . [START ON 03/23/2018] bisacodyl  10 mg Rectal Daily  . [START ON 03/23/2018] docusate sodium  200 mg Oral Daily  . insulin regular  0-10 Units Intravenous TID WC  . metoprolol tartrate  12.5 mg Oral BID   Or  . metoprolol tartrate  12.5 mg Per Tube BID  . mupirocin cream   Topical BID  . [START ON 03/24/2018] pantoprazole  40 mg Oral Daily  . [START ON 03/23/2018] sodium chloride flush  3 mL Intravenous Q12H   Continuous Infusions: . sodium chloride 10 mL/hr at 03/22/18 1800  . [START ON 03/23/2018] sodium chloride    . sodium chloride 10 mL/hr at 03/22/18 1357  . albumin human 12.5 g (03/22/18 1549)  . amiodarone 30 mg/hr (03/22/18 1822)  . cefUROXime (ZINACEF)  IV    . dexmedetomidine (PRECEDEX) IV infusion 0.4 mcg/kg/hr (03/22/18 1800)  . DOPamine 3 mcg/kg/min (03/22/18 1800)  . famotidine (PEPCID) IV 20 mg (03/22/18 1320)  . insulin (NOVOLIN-R) infusion 3 mL/hr at 03/22/18 1800  . lactated ringers    . lactated ringers 20 mL/hr at 03/22/18 1800  . magnesium sulfate 20 mL/hr at 03/22/18 1800  . milrinone 0.125 mcg/kg/min (03/22/18 1800)  . nitroGLYCERIN Stopped (03/22/18 1317)  . norepinephrine (LEVOPHED) Adult infusion 6 mcg/min (03/22/18 1800)  .  phenylephrine (NEO-SYNEPHRINE) Adult infusion    . vancomycin     PRN Meds: sodium chloride, albumin human, metoprolol tartrate, midazolam, morphine injection, [START ON 03/23/2018]  morphine injection, ondansetron (ZOFRAN) IV, oxyCODONE, [START ON 03/23/2018] sodium chloride flush, traMADol   Vital Signs    Vitals:   03/22/18 1655 03/22/18 1700 03/22/18 1755 03/22/18 1800  BP:  101/71  125/74  Pulse: 78 78 92 91  Resp: 20 (!) 21 20 18   Temp: 97.7 F (36.5 C) 97.9 F (36.6 C) 97.7 F (36.5 C) 97.9 F (36.6 C)  TempSrc:      SpO2: 100% 100% 100% 100%  Weight:      Height:        Intake/Output Summary (Last 24 hours) at 03/22/2018 1833 Last data filed at 03/22/2018 1828 Gross per 24 hour  Intake 5947.41 ml  Output 2370 ml  Net 3577.41 ml   Filed Weights   03/16/18 1831 03/17/18 0500 03/22/18 0411  Weight: 95.6 kg 94.3 kg 95 kg    Telemetry    Accelerated junctional rhythm - Personally Reviewed  ECG    Accelerated junctional rhythm 90 bpm, no acute ST-T changes - Personally Reviewed  Physical Exam  Intubated, sedated, awakens to voice GEN: No acute distress.   Neck: positive JVD Cardiac: RRR, no murmurs, rubs, or gallops.  Respiratory: Clear to auscultation bilaterally.  GI: Soft, nontender, non-distended  MS: diffuse edema; No deformity. Neuro:  Nonfocal  Psych: Normal affect   Labs    Chemistry Recent Labs  Lab 03/17/18 0446 03/18/18 0448 03/19/18 0330 03/22/18 0414  03/22/18 1058 03/22/18 1128 03/22/18 1220 03/22/18 1328  NA 137 136 135 138   < > 134* 132* 133* 135  K 3.7 3.8 4.0 4.1   < > 4.6 5.1 4.9 4.7  CL 105 104 103 104   < > 102 101 101  --   CO2 24 23 22 25   --   --   --   --   --   GLUCOSE 120* 119* 112* 107*   < > 175* 206* 166* 161*  BUN 13 15 21 16    < > 15 15 15   --   CREATININE 1.05 1.05 1.11 1.12   < > 0.90 0.80 0.80  --   CALCIUM 9.2 9.2 9.1 9.4  --   --   --   --   --   PROT 6.2*  --   --   --   --   --   --   --   --   ALBUMIN  3.5  --   --   --   --   --   --   --   --   AST 125*  --   --   --   --   --   --   --   --   ALT 33  --   --   --   --   --   --   --   --   ALKPHOS 47  --   --   --   --   --   --   --   --   BILITOT 1.2  --   --   --   --   --   --   --   --   GFRNONAA >60 >60 >60 >60  --   --   --   --   --   GFRAA >60 >60 >60 >60  --   --   --   --   --   ANIONGAP 8 9 10 9   --   --   --   --   --    < > = values in this interval not displayed.     Hematology Recent Labs  Lab 03/21/18 0413 03/22/18 0414  03/22/18 1121  03/22/18 1220 03/22/18 1324 03/22/18 1328  WBC 7.7 7.5  --   --   --   --  17.0*  --   RBC 4.02* 4.02*  --   --   --   --  3.53*  --   HGB 11.5* 11.7*   < > 8.3*   < > 8.2* 10.3* 9.5*  HCT 34.6* 34.7*   < > 24.4*   < > 24.0* 30.6* 28.0*  MCV 86.1 86.3  --   --   --   --  86.7  --   MCH 28.6 29.1  --   --   --   --  29.2  --   MCHC 33.2 33.7  --   --   --   --  33.7  --   RDW 12.4 12.4  --   --   --   --  12.2  --   PLT 206 233  --  200  --   --  175  --    < > = values in this interval not displayed.    Cardiac EnzymesNo results for input(s): TROPONINI in the last 168 hours. No results for input(s): TROPIPOC in the last 168 hours.   BNPNo results for input(s): BNP, PROBNP in the last 168 hours.   DDimer No results for input(s): DDIMER in the last 168 hours.   Radiology    Dg Chest Port 1 View  Result Date: 03/22/2018 CLINICAL DATA:  Postop CABG EXAM: PORTABLE CHEST 1 VIEW COMPARISON:  03/20/2018 FINDINGS: Changes of CABG. Swan-Ganz catheter tip is in the main pulmonary artery. Endotracheal tube is 5 cm above the carina. NG tube enters the stomach. Left chest tube pull in place. No pneumothorax. Minimal bibasilar atelectasis. Mild cardiomegaly. IMPRESSION: Postoperative changes from CABG. No visible pneumothorax. Bibasilar atelectasis. Electronically Signed   By: Rolm Baptise M.D.   On: 03/22/2018 13:37    Cardiac Studies   2D Echo personally reviewed - formal  interpretation pending. Normal LV systolic function with post-op septal motion, mild-moderate MR, RV poorly visualized  Patient Profile     72 y.o. male with NSTEMI s/p CABG 9/24, with refractory hypotension  Assessment & Plan    1. Post-op shock, cardiogenic, with low cardiac output 2. NSTEMI s/p CABG 3. Atrial fibrillation/junctional rhythm  Pt evaluated - awakens to command. Stat echo done - findings outlined above - LV grossly normal, no significant valvular disease, mild-moderate MR, RV not well-visualized but doesn't appear dilated. No pericardial effusion. Hemodynamics reassessed and improved with addition of dopamine and administration of calcium. D/W Dr Darcey Nora. Will follow for now and consider hemodynamic support with impella or IABP if he decompensates further.   The patient is critically ill with multiple organ systems failure and requires high complexity decision making for assessment and support, frequent evaluation and titration of therapies, application of advanced monitoring technologies and extensive interpretation of multiple databases.   Critical Care Time devoted to patient care services described in this note is 40 minutes.    For questions or updates, please contact [place] Please consult [email or web address] for contact info under        Signed, Sherren Mocha, MD  03/22/2018, 6:33 PM

## 2018-03-22 NOTE — Anesthesia Procedure Notes (Addendum)
Central Venous Catheter Insertion Performed by: Nolon Nations, MD, anesthesiologist Start/End6/20/2019 6:20 AM, 03/22/2018 6:40 AM Patient location: Pre-op. Preanesthetic checklist: patient identified, IV checked, site marked, risks and benefits discussed, surgical consent, monitors and equipment checked, pre-op evaluation, timeout performed and anesthesia consent Lidocaine 1% used for infiltration and patient sedated Hand hygiene performed  and maximum sterile barriers used  Catheter size: 8.5 Fr Central line was placed.Sheath introducer Procedure performed using ultrasound guided technique. Ultrasound Notes:anatomy identified, needle tip was noted to be adjacent to the nerve/plexus identified, no ultrasound evidence of intravascular and/or intraneural injection and image(s) printed for medical record Attempts: 1 Following insertion, line sutured, dressing applied and Biopatch. Post procedure assessment: blood return through all ports, free fluid flow and no air  Patient tolerated the procedure well with no immediate complications.

## 2018-03-22 NOTE — Progress Notes (Signed)
ANTICOAGULATION CONSULT NOTE  Pharmacy Consult for heparin Indication: atrial fibrillation/chest pain  No Known Allergies  Patient Measurements: Height: 6' (182.9 cm) Weight: 209 lb 8 oz (95 kg) IBW/kg (Calculated) : 77.6 Heparin Dosing Weight: 95kg  Vital Signs: Temp: 98.4 F (36.9 C) (09/24 0411) Temp Source: Oral (09/24 0411) BP: 122/72 (09/24 0411) Pulse Rate: 86 (09/24 0411)  Labs: Recent Labs    03/20/18 0353 03/20/18 1510 03/21/18 0413 03/22/18 0414  HGB 12.2*  --  11.5* 11.7*  HCT 36.5*  --  34.6* 34.7*  PLT 210  --  206 233  HEPARINUNFRC 0.22* 0.56 0.59 0.60  CREATININE  --   --   --  1.12    Estimated Creatinine Clearance: 70.3 mL/min (by C-G formula based on SCr of 1.12 mg/dL).   Medical History: Past Medical History:  Diagnosis Date  . CAD (coronary artery disease)   . Persistent atrial fibrillation ([place])   . Tachycardia    Assessment: David Mccullough transferred from [facility] for consideration for PCI reattempt vs CABG consult. Pt started on IV heparin for ACS r/o and new-onset AFib. Now awaiting Plavix washout for CABG.  Heparin goal is therapeutic at 0.6, on 2400 units/hr. Hgb 11.7, plt 233. No s/sx of bleeding. No infusion issues documented by nursing.   Goal of Therapy:  Heparin level 0.3-0.7 units/ml Monitor platelets by anticoagulation protocol: Yes   Plan: Currently undergoing CABG scheduled for 9/24 Will follow with patient to determine if anticoagulation plan post-procedure is needed given pre-op atrial fibrillation  Doylene Canard, PharmD Clinical Pharmacist  Pager: [phone] Phone: [phone] Please check AMION for all [place] contact numbers 03/22/2018 7:24 AM

## 2018-03-22 NOTE — Brief Op Note (Signed)
03/16/2018 - 03/22/2018  7:30 AM  PATIENT:  David Mccullough  72 y.o. male  PRE-OPERATIVE DIAGNOSIS:  CAD  POST-OPERATIVE DIAGNOSIS:  CAD  PROCEDURE:  Procedure(s): CORONARY ARTERY BYPASS GRAFTING (CABG) x 3 WITH ENDOSCOPIC HARVESTING OF RIGHT GREATER SAPHENOUS VEIN (N/A) TRANSESOPHAGEAL ECHOCARDIOGRAM (TEE) (N/A) LEFT ATRIAL APPENDAGE OCCLUSION USING 40 MM ATRICURE ATRICLIP (N/A) LIMA-LAD SVG-OM SVG-RAMUS  SURGEON:  Surgeon(s) and Role:    Ivin Poot, MD - Primary  PHYSICIAN ASSISTANT: Noely Kuhnle PA-C  ANESTHESIA:   general  EBL:  635 mL   BLOOD ADMINISTERED:2 UNITS FFP  DRAINS: ROUTINE PLEURAL AND PERICARDIAL CHEST TUBES   LOCAL MEDICATIONS USED:  NONE  SPECIMEN:  No Specimen  DISPOSITION OF SPECIMEN:  N/A  COUNTS:  YES  TOURNIQUET:  * No tourniquets in log *  DICTATION: .Other Dictation: Dictation Number [id number]  PLAN OF CARE: Admit to inpatient   PATIENT DISPOSITION:  ICU - intubated and hemodynamically stable.   Delay start of Pharmacological VTE agent (>24hrs) due to surgical blood loss or risk of bleeding: yes

## 2018-03-22 NOTE — Progress Notes (Signed)
  Echocardiogram 2D Echocardiogram has been performed.  David Mccullough G Edyth Glomb 03/22/2018, 7:04 PM

## 2018-03-22 NOTE — Anesthesia Procedure Notes (Signed)
Procedure Name: Intubation Date/Time: 03/22/2018 7:54 AM Performed by: Lavell Luster, CRNA Pre-anesthesia Checklist: Patient identified, Emergency Drugs available, Suction available, Patient being monitored and Timeout performed Patient Re-evaluated:Patient Re-evaluated prior to induction Oxygen Delivery Method: Circle system utilized Preoxygenation: Pre-oxygenation with 100% oxygen Induction Type: IV induction Ventilation: Mask ventilation without difficulty and Oral airway inserted - appropriate to patient size Laryngoscope Size: Mac and 4 Grade View: Grade III Tube type: Oral Tube size: 7.5 mm Number of attempts: 2 Airway Equipment and Method: Video-laryngoscopy and Stylet Placement Confirmation: ETT inserted through vocal cords under direct vision,  positive ETCO2 and breath sounds checked- equal and bilateral Secured at: 22 cm Tube secured with: Tape Dental Injury: Teeth and Oropharynx as per pre-operative assessment  Difficulty Due To: Difficulty was anticipated Future Recommendations: Recommend- induction with short-acting agent, and alternative techniques readily available Comments: DL with MAC 3 blade with Grade III view, DL with MAC 4 on video laryngoscope with good view of cords, ETT passed easily.  Dr Fransisco Beau verified placement.  ETT secured and pt placed on vent.  Henderson Cloud, CRNA

## 2018-03-23 ENCOUNTER — Inpatient Hospital Stay (HOSPITAL_COMMUNITY): Payer: Medicare Other

## 2018-03-23 ENCOUNTER — Encounter (HOSPITAL_COMMUNITY): Payer: Self-pay | Admitting: Cardiothoracic Surgery

## 2018-03-23 LAB — CBC
HCT: 30.4 % — ABNORMAL LOW (ref 39.0–52.0)
HCT: 31.2 % — ABNORMAL LOW (ref 39.0–52.0)
HEMOGLOBIN: 10.4 g/dL — AB (ref 13.0–17.0)
Hemoglobin: 10.5 g/dL — ABNORMAL LOW (ref 13.0–17.0)
MCH: 29.3 pg (ref 26.0–34.0)
MCH: 29.1 pg (ref 26.0–34.0)
MCHC: 34.2 g/dL (ref 30.0–36.0)
MCHC: 33.7 g/dL (ref 30.0–36.0)
MCV: 85.6 fL (ref 78.0–100.0)
MCV: 86.4 fL (ref 78.0–100.0)
PLATELETS: 216 10*3/uL (ref 150–400)
PLATELETS: 214 10*3/uL (ref 150–400)
RBC: 3.55 MIL/uL — AB (ref 4.22–5.81)
RBC: 3.61 MIL/uL — AB (ref 4.22–5.81)
RDW: 12.1 % (ref 11.5–15.5)
RDW: 12.6 % (ref 11.5–15.5)
WBC: 13.6 10*3/uL — ABNORMAL HIGH (ref 4.0–10.5)
WBC: 18.2 10*3/uL — AB (ref 4.0–10.5)

## 2018-03-23 LAB — PREPARE FRESH FROZEN PLASMA
Unit division: 0
Unit division: 0

## 2018-03-23 LAB — POCT I-STAT 3, ART BLOOD GAS (G3+)
Acid-base deficit: 2 mmol/L (ref 0.0–2.0)
Acid-base deficit: 3 mmol/L — ABNORMAL HIGH (ref 0.0–2.0)
Acid-base deficit: 2 mmol/L (ref 0.0–2.0)
Bicarbonate: 21.8 mmol/L (ref 20.0–28.0)
Bicarbonate: 20.8 mmol/L (ref 20.0–28.0)
Bicarbonate: 21.9 mmol/L (ref 20.0–28.0)
O2 Saturation: 98 %
O2 Saturation: 96 %
O2 Saturation: 97 %
Patient temperature: 37.6
Patient temperature: 37.3
Patient temperature: 37.2
TCO2: 23 mmol/L (ref 22–32)
TCO2: 22 mmol/L (ref 22–32)
TCO2: 23 mmol/L (ref 22–32)
pCO2 arterial: 34.4 mmHg (ref 32.0–48.0)
pCO2 arterial: 31.6 mmHg — ABNORMAL LOW (ref 32.0–48.0)
pCO2 arterial: 34.8 mmHg (ref 32.0–48.0)
pH, Arterial: 7.413 (ref 7.350–7.450)
pH, Arterial: 7.426 (ref 7.350–7.450)
pH, Arterial: 7.408 (ref 7.350–7.450)
pO2, Arterial: 105 mmHg (ref 83.0–108.0)
pO2, Arterial: 81 mmHg — ABNORMAL LOW (ref 83.0–108.0)
pO2, Arterial: 94 mmHg (ref 83.0–108.0)

## 2018-03-23 LAB — CREATININE, SERUM
CREATININE: 1.17 mg/dL (ref 0.61–1.24)
GFR calc Af Amer: >60 mL/min (ref >60)
GFR calc non Af Amer: >60 mL/min (ref >60)

## 2018-03-23 LAB — GLUCOSE, CAPILLARY
Glucose-Capillary: 112 mg/dL — ABNORMAL HIGH (ref 70–99)
Glucose-Capillary: 118 mg/dL — ABNORMAL HIGH (ref 70–99)
Glucose-Capillary: 112 mg/dL — ABNORMAL HIGH (ref 70–99)
Glucose-Capillary: 98 mg/dL (ref 70–99)
Glucose-Capillary: 110 mg/dL — ABNORMAL HIGH (ref 70–99)
Glucose-Capillary: 114 mg/dL — ABNORMAL HIGH (ref 70–99)
Glucose-Capillary: 134 mg/dL — ABNORMAL HIGH (ref 70–99)
Glucose-Capillary: 140 mg/dL — ABNORMAL HIGH (ref 70–99)
Glucose-Capillary: 161 mg/dL — ABNORMAL HIGH (ref 70–99)
Glucose-Capillary: 125 mg/dL — ABNORMAL HIGH (ref 70–99)
Glucose-Capillary: 112 mg/dL — ABNORMAL HIGH (ref 70–99)
Glucose-Capillary: 123 mg/dL — ABNORMAL HIGH (ref 70–99)
Glucose-Capillary: 127 mg/dL — ABNORMAL HIGH (ref 70–99)
Glucose-Capillary: 123 mg/dL — ABNORMAL HIGH (ref 70–99)
Glucose-Capillary: 125 mg/dL — ABNORMAL HIGH (ref 70–99)
Glucose-Capillary: 169 mg/dL — ABNORMAL HIGH (ref 70–99)

## 2018-03-23 LAB — BASIC METABOLIC PANEL
Anion gap: 8 (ref 5–15)
BUN: 16 mg/dL (ref 8–23)
CHLORIDE: 104 mmol/L (ref 98–111)
CO2: 22 mmol/L (ref 22–32)
Calcium: 8.6 mg/dL — ABNORMAL LOW (ref 8.9–10.3)
Creatinine, Ser: 0.95 mg/dL (ref 0.61–1.24)
GFR calc Af Amer: >60 mL/min (ref >60)
GLUCOSE: 114 mg/dL — AB (ref 70–99)
POTASSIUM: 4.3 mmol/L (ref 3.5–5.1)
Sodium: 134 mmol/L — ABNORMAL LOW (ref 135–145)

## 2018-03-23 LAB — BPAM FFP
Blood Product Expiration Date: 201909292359
Blood Product Expiration Date: 201909292359
ISSUE DATE / TIME: 201909241203
ISSUE DATE / TIME: 201909241203
Unit Type and Rh: 7300
Unit Type and Rh: 7300

## 2018-03-23 LAB — COOXEMETRY PANEL
Carboxyhemoglobin: 1.3 % (ref 0.5–1.5)
Carboxyhemoglobin: 1.4 % (ref 0.5–1.5)
Methemoglobin: 0.9 % (ref 0.0–1.5)
Methemoglobin: 1 % (ref 0.0–1.5)
O2 Saturation: 62.1 %
O2 Saturation: 56.2 %
Total hemoglobin: 9.7 g/dL — ABNORMAL LOW (ref 12.0–16.0)
Total hemoglobin: 10.9 g/dL — ABNORMAL LOW (ref 12.0–16.0)

## 2018-03-23 LAB — POCT I-STAT, CHEM 8
BUN: 19 mg/dL (ref 8–23)
Calcium, Ion: 1.28 mmol/L (ref 1.15–1.40)
Chloride: 99 mmol/L (ref 98–111)
Creatinine, Ser: 1.1 mg/dL (ref 0.61–1.24)
Glucose, Bld: 139 mg/dL — ABNORMAL HIGH (ref 70–99)
HCT: 29 % — ABNORMAL LOW (ref 39.0–52.0)
Hemoglobin: 9.9 g/dL — ABNORMAL LOW (ref 13.0–17.0)
Potassium: 4.4 mmol/L (ref 3.5–5.1)
Sodium: 131 mmol/L — ABNORMAL LOW (ref 135–145)
TCO2: 21 mmol/L — ABNORMAL LOW (ref 22–32)

## 2018-03-23 LAB — MAGNESIUM
MAGNESIUM: 2.5 mg/dL — AB (ref 1.7–2.4)
Magnesium: 2.1 mg/dL (ref 1.7–2.4)

## 2018-03-23 MED ORDER — ENOXAPARIN SODIUM 40 MG/0.4ML ~~LOC~~ SOLN
40.0000 mg | Freq: Every day | SUBCUTANEOUS | Status: DC
  Administered 2018-03-23 – 2018-03-29 (×7): 40 mg via SUBCUTANEOUS
  Filled 2018-03-23 (×7): qty 0.4

## 2018-03-23 MED ORDER — FUROSEMIDE 10 MG/ML IJ SOLN
40.0000 mg | Freq: Once | INTRAMUSCULAR | Status: AC
  Administered 2018-03-23: 40 mg via INTRAVENOUS
  Filled 2018-03-23: qty 4

## 2018-03-23 MED ORDER — AMIODARONE HCL 200 MG PO TABS
400.0000 mg | ORAL_TABLET | Freq: Two times a day (BID) | ORAL | Status: DC
  Administered 2018-03-23 – 2018-03-24 (×3): 400 mg via ORAL
  Filled 2018-03-23 (×3): qty 2

## 2018-03-23 MED ORDER — INSULIN ASPART 100 UNIT/ML ~~LOC~~ SOLN
0.0000 [IU] | SUBCUTANEOUS | Status: DC
  Administered 2018-03-23: 2 [IU] via SUBCUTANEOUS
  Administered 2018-03-23: 4 [IU] via SUBCUTANEOUS
  Administered 2018-03-23 – 2018-03-24 (×3): 2 [IU] via SUBCUTANEOUS
  Administered 2018-03-24: 4 [IU] via SUBCUTANEOUS
  Administered 2018-03-24 – 2018-03-25 (×4): 2 [IU] via SUBCUTANEOUS

## 2018-03-23 MED ORDER — TRAZODONE HCL 50 MG PO TABS
50.0000 mg | ORAL_TABLET | Freq: Every day | ORAL | Status: DC
  Administered 2018-03-23 – 2018-03-29 (×7): 50 mg via ORAL
  Filled 2018-03-23 (×7): qty 1

## 2018-03-23 MED ORDER — FUROSEMIDE 10 MG/ML IJ SOLN: 40.0000 mg | Freq: Every day | INTRAMUSCULAR | Status: DC

## 2018-03-23 MED ORDER — INSULIN DETEMIR 100 UNIT/ML ~~LOC~~ SOLN
10.0000 [IU] | Freq: Two times a day (BID) | SUBCUTANEOUS | Status: DC
  Administered 2018-03-23 – 2018-03-24 (×4): 10 [IU] via SUBCUTANEOUS
  Filled 2018-03-23 (×5): qty 0.1

## 2018-03-23 MED ORDER — FUROSEMIDE 10 MG/ML IJ SOLN
20.0000 mg | Freq: Every day | INTRAMUSCULAR | Status: DC
  Administered 2018-03-23: 20 mg via INTRAVENOUS
  Filled 2018-03-23: qty 2

## 2018-03-23 MED ORDER — METOCLOPRAMIDE HCL 5 MG/ML IJ SOLN
10.0000 mg | Freq: Four times a day (QID) | INTRAMUSCULAR | Status: DC
  Administered 2018-03-23 – 2018-03-28 (×20): 10 mg via INTRAVENOUS
  Filled 2018-03-23 (×18): qty 2

## 2018-03-23 MED FILL — Sodium Chloride IV Soln 0.9%: INTRAVENOUS | Qty: 3000 | Status: AC

## 2018-03-23 MED FILL — Potassium Chloride Inj 2 mEq/ML: INTRAVENOUS | Qty: 40 | Status: AC

## 2018-03-23 MED FILL — Heparin Sodium (Porcine) Inj 1000 Unit/ML: INTRAMUSCULAR | Qty: 30 | Status: AC

## 2018-03-23 MED FILL — Heparin Sodium (Porcine) Inj 1000 Unit/ML: INTRAMUSCULAR | Qty: 10 | Status: AC

## 2018-03-23 MED FILL — Magnesium Sulfate Inj 50%: INTRAMUSCULAR | Qty: 10 | Status: AC

## 2018-03-23 MED FILL — Lidocaine HCl(Cardiac) IV PF Soln Pref Syr 100 MG/5ML (2%): INTRAVENOUS | Qty: 5 | Status: AC

## 2018-03-23 MED FILL — Sodium Bicarbonate IV Soln 8.4%: INTRAVENOUS | Qty: 50 | Status: AC

## 2018-03-23 MED FILL — Electrolyte-R (PH 7.4) Solution: INTRAVENOUS | Qty: 4000 | Status: AC

## 2018-03-23 NOTE — Plan of Care (Signed)

## 2018-03-23 NOTE — Progress Notes (Addendum)
[facility] DAILY ICU PROGRESS NOTE                   [address]            [place] [address]          [phone]   1 Day Post-Op Procedure(s) (LRB): CORONARY ARTERY BYPASS GRAFTING (CABG) x 3 WITH ENDOSCOPIC HARVESTING OF RIGHT GREATER SAPHENOUS VEIN (N/A) TRANSESOPHAGEAL ECHOCARDIOGRAM (TEE) (N/A) LEFT ATRIAL APPENDAGE OCCLUSION USING 40 MM ATRICURE ATRICLIP (N/A)  Total Length of Stay:  LOS: 7 days   Subjective: Feels ok, no significant pain, not SOB, no nausea  Objective: Vital signs in last 24 hours: Temp:  [97 F (36.1 C)-99.5 F (37.5 C)] 99 F (37.2 C) (09/25 0715) Pulse Rate:  [77-98] 88 (09/25 0715) Cardiac Rhythm: A-V Sequential paced (09/25 0400) Resp:  [16-36] 27 (09/25 0715) BP: (89-130)/(52-82) 122/74 (09/25 0700) SpO2:  [93 %-100 %] 98 % (09/25 0715) Arterial Line BP: (75-134)/(50-68) 114/52 (09/25 0715) FiO2 (%):  [40 %-50 %] 40 % (09/25 0510) Weight:  [99.9 kg] 99.9 kg (09/25 0500)  Filed Weights   03/17/18 0500 03/22/18 0411 03/23/18 0500  Weight: 94.3 kg 95 kg 99.9 kg    Weight change: 4.871 kg   Hemodynamic parameters for last 24 hours: PAP: (23-43)/(11-27) 28/14 CVP:  [12 mmHg-14 mmHg] 12 mmHg CO:  [3 L/min-4.9 L/min] 4.5 L/min CI:  [1.4 L/min/m2-2.3 L/min/m2] 2.1 L/min/m2  Intake/Output from previous day: 09/24 0701 - 09/25 0700 In: 7198 [I.V.:4714.8; Blood:1203; NG/GT:30; IV Piggyback:1250.2] Out: 2995 [Urine:1990; Blood:635; Chest Tube:370]  Intake/Output this shift: No intake/output data recorded.  Current Meds: Scheduled Meds: . acetaminophen  1,000 mg Oral Q6H   Or  . acetaminophen (TYLENOL) oral liquid 160 mg/5 mL  1,000 mg Per Tube Q6H  . aspirin EC  325 mg Oral Daily   Or  . aspirin  324 mg Per Tube Daily  . atorvastatin  80 mg Oral q1800  . bisacodyl  10 mg Oral Daily   Or  . bisacodyl  10 mg Rectal Daily  . chlorhexidine gluconate (MEDLINE KIT)  15 mL Mouth Rinse BID  . Chlorhexidine Gluconate Cloth  6  each Topical Daily  . docusate sodium  200 mg Oral Daily  . insulin regular  0-10 Units Intravenous TID WC  . mouth rinse  15 mL Mouth Rinse 10 times per day  . metoprolol tartrate  12.5 mg Oral BID   Or  . metoprolol tartrate  12.5 mg Per Tube BID  . mupirocin cream   Topical BID  . [START ON 03/24/2018] pantoprazole  40 mg Oral Daily  . sodium chloride flush  10-40 mL Intracatheter Q12H  . sodium chloride flush  3 mL Intravenous Q12H   Continuous Infusions: . sodium chloride 10 mL/hr at 03/23/18 0600  . sodium chloride    . sodium chloride 10 mL/hr at 03/22/18 1357  . albumin human 12.5 g (03/22/18 1549)  . amiodarone 30 mg/hr (03/23/18 0600)  . cefUROXime (ZINACEF)  IV 1.5 g (03/22/18 2119)  . dexmedetomidine Stopped (03/23/18 0446)  . DOPamine 3 mcg/kg/min (03/23/18 0600)  . insulin (NOVOLIN-R) infusion 4.4 Units/hr (03/23/18 0645)  . lactated ringers    . lactated ringers 20 mL/hr at 03/23/18 0600  . milrinone 0.125 mcg/kg/min (03/23/18 0600)  . nitroGLYCERIN Stopped (03/22/18 1317)  . norepinephrine (LEVOPHED) Adult infusion 7 mcg/min (03/23/18 0600)  . phenylephrine (NEO-SYNEPHRINE) Adult infusion     PRN Meds:.sodium chloride, albumin  human, metoprolol tartrate, midazolam, morphine injection, ondansetron (ZOFRAN) IV, oxyCODONE, sodium chloride flush, sodium chloride flush, traMADol  General appearance: alert, cooperative and no distress Heart: regular rate and rhythm Lungs: mildly din in bases Abdomen: soft, nontender Extremities: no significant edema Wound: dressings CDI  Lab Results: CBC: Recent Labs    03/22/18 1829 03/22/18 1844 03/23/18 0300  WBC 16.3*  --  13.6*  HGB 10.4* 10.5* 10.4*  HCT 30.9* 31.0* 30.4*  PLT 210  --  216   BMET:  Recent Labs    03/22/18 0414  03/22/18 1844 03/23/18 0300  NA 138   < > 136 134*  K 4.1   < > 4.6 4.3  CL 104   < > 103 104  CO2 25  --   --  22  GLUCOSE 107*   < > 153* 114*  BUN 16   < > 17 16  CREATININE 1.12    < > 1.10 0.95  CALCIUM 9.4  --   --  8.6*   < > = values in this interval not displayed.    CMET: Lab Results  Component Value Date   WBC 13.6 (H) 03/23/2018   HGB 10.4 (L) 03/23/2018   HCT 30.4 (L) 03/23/2018   PLT 216 03/23/2018   GLUCOSE 114 (H) 03/23/2018   CHOL 158 03/17/2018   TRIG 111 03/17/2018   HDL 36 (L) 03/17/2018   LDLCALC 100 (H) 03/17/2018   ALT 33 03/17/2018   AST 125 (H) 03/17/2018   NA 134 (L) 03/23/2018   K 4.3 03/23/2018   CL 104 03/23/2018   CREATININE 0.95 03/23/2018   BUN 16 03/23/2018   CO2 22 03/23/2018   TSH 1.977 03/17/2018   INR 1.39 03/22/2018   HGBA1C 5.2 03/17/2018      PT/INR:  Recent Labs    03/22/18 1324  LABPROT 16.9*  INR 1.39   Radiology: Dg Chest Port 1 View  Result Date: 03/22/2018 CLINICAL DATA:  Postop CABG EXAM: PORTABLE CHEST 1 VIEW COMPARISON:  03/20/2018 FINDINGS: Changes of CABG. Swan-Ganz catheter tip is in the main pulmonary artery. Endotracheal tube is 5 cm above the carina. NG tube enters the stomach. Left chest tube pull in place. No pneumothorax. Minimal bibasilar atelectasis. Mild cardiomegaly. IMPRESSION: Postoperative changes from CABG. No visible pneumothorax. Bibasilar atelectasis. Electronically Signed   By: Rolm Baptise M.D.   On: 03/22/2018 13:37     Assessment/Plan: S/P Procedure(s) (LRB): CORONARY ARTERY BYPASS GRAFTING (CABG) x 3 WITH ENDOSCOPIC HARVESTING OF RIGHT GREATER SAPHENOUS VEIN (N/A) TRANSESOPHAGEAL ECHOCARDIOGRAM (TEE) (N/A) LEFT ATRIAL APPENDAGE OCCLUSION USING 40 MM ATRICURE ATRICLIP (N/A)  1 doing well 2 stable hemodynamics on current pressor support, does not appear to be significantly volume overloaded by CXR/PAP/CVP. Wt about 4 kg over preop, UOP is adequate for now, may need a little diuretic - follow for now. Hopefully can make some progress weaning GTTs today 3 sinus rhythm with 1 deg AVB, history of persist afib, on amiodarone currently. LAA - clip in place. 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 [name] 03/23/2018

## 2018-03-23 NOTE — Op Note (Signed)
David Mccullough, David Mccullough MEDICAL RECORD [id number] ACCOUNT [id number] DATE OF BIRTH:Nov 06, 1945 FACILITY: [facility] LOCATION: [facility] PHYSICIAN:Kwesi Sangha VAN TRIGT III, MD  OPERATIVE REPORT  DATE OF PROCEDURE:  03/22/2018  OPERATION: 1.  Coronary artery bypass grafting x3 (left internal mammary artery to left anterior descending, saphenous vein graft to ramus intermedius, saphenous vein graft to circumflex marginal). 2.  Endoscopic harvest of right leg greater saphenous vein.  SURGEON:  Ivin Poot, MD  ASSISTANT:  Jadene Pierini PA-C.  PREOPERATIVE DIAGNOSES:  Severe multivessel coronary artery disease, recent non-ST segment elevated myocardial infarction after attempted percutaneous coronary intervention of circumflex marginal stenosis for symptoms of progressive angina.  POSTOPERATIVE DIAGNOSES:  Severe multivessel coronary artery disease, recent non-ST segment elevated myocardial infarction after attempted percutaneous coronary intervention of circumflex marginal stenosis for symptoms of progressive angina.  ANESTHESIA:  General by Dr. Renold Don.  CLINICAL NOTE:  The patient is 72 years old who was transferred to this hospital after being admitted at [facility] for chest pain and undergoing diagnostic catheterization which demonstrated significant multivessel disease.  He underwent  attempted PCI of the culprit vessel.  The OM1 but the procedure was without success and resulted in closure of the vessel with a non-STEMI with troponin peaking 5-10 International Units.  The patient had been loaded with Plavix 600 mg at the time of the  procedure.  He was transferred to this hospital for thoracic surgical evaluation for surgical revascularization.  After reviewing the catheterization films and echocardiogram,  I discussed the situation with the patient.  I agreed with the  recommendation for surgical revascularization based on the patient's significant multivessel coronary disease  and symptoms of angina.  I discussed the procedure in detail with the patient and family including the use of general anesthesia and  cardiopulmonary bypass, location of the surgical incisions, and the expected postoperative hospital recovery.  I reviewed with the patient the risks to him of the surgery including the risk of stroke, bleeding, blood transfusion, postoperative pulmonary  problems including pleural effusion, postoperative MI, postoperative organ failure, postoperative infection, and death.  After reviewing these issues he demonstrated his understanding and agreed to proceed with surgery on what I felt was informed  consent.  OPERATIVE FINDINGS: 1.  Adequate conduit from the saphenous vein and left internal mammary artery. 2.  Intramyocardial vessels in the circumflex, ramus and right coronary vessels. 3.  The right coronary artery was not grafted.  It was chronically occluded.  The distal vessel was deeply intramyocardial in the AV groove and was visualized only at its distal aspect where it was too small to graft. 4.  The patient had some evidence of residual coagulopathy after the Plavix washout and was given a unit of FFP in the operating room to improved coagulation function. 5.  The patient came off cardiopulmonary bypass with normal biventricular function and was transferred to the ICU in stable condition.   6.  The patient had history of brief atrial fibrillation at the time of his admission and a 35 mm left atrial clip was applied after the distal coronary vein anastomoses had been completed.  DESCRIPTION OF PROCEDURE:  The patient was brought directly from preop holding to the operating room where informed consent was documented and final issues were addressed.  The patient was placed supine on the operating table and general anesthesia was  induced.  He remained stable.  A transesophageal echo probe was placed by the anesthesia team.  The patient was prepped and draped as  a  sterile field.  A proper time-out was performed.  A sternal incision was made as the saphenous vein was harvested  endoscopically from the right leg.  The left internal mammary artery was harvested as a pedicle graft from its origin at the subclavian vessels.  It was a good vessel 1.7 mm with good flow.  The sternal retractor was placed, the pericardium was opened  and suspended.  The heart was enlarged.  The ascending aorta was without significant calcification.  The patient was heparinized and the ACT was documented as being therapeutic.  The patient was placed on cardiopulmonary bypass through pursestrings were  placed in the ascending aorta and right atrium.  The coronaries were identified for grafting.  The LAD was diffusely diseased vessel and was grafted in the distal third.  The ramus intermediate was a 1.4 mm vessel, deeply intramyocardial and was grafted in its proximal extent.  The OM branch of the  circumflex was heavily diseased and there was some surrounding myocardial edema consistent with a recent non-STEMI.  The right coronary artery was deeply intramyocardial and AV groove and had been chronically occluded with an inferior scar, which was  visible.  The vessel was not readily identified with dissection in the AV groove and I did not feel comfortable proceeding with this vessel, which was probably of marginal benefit to the patient.  The vessel was too small to graft where it was visible at  its distal aspect.  Cardioplegia cannulas were placed both antegrade and retrograde cold blood cardioplegia.  The patient was cooled to 32 degrees.  The aortic crossclamp was applied, and a liter of cold blood was delivered with cardioplegia between the antegrade aortic and  retrograde coronary sinus catheters.  There was good cardioplegic arrest and supple temperature dropped less than 14 degrees.  Cardioplegia was delivered every 20 minutes.  The distal coronary anastomoses were performed.  First,  distal anastomosis was the circumflex marginal branch of the left coronary.  This was a 1.5 mm vessel, with proximal total occlusion.  A reverse saphenous vein was sewn end-to-side with running 7-0  Prolene with good flow through the graft.  Cardioplegia was redosed.  The second distal anastomosis was the ramus intermedius branch of the left coronary.  There was a proximal 80% stenosis.  It was intramyocardial.  A reverse saphenous vein was sewn end-to-side with running 7-0 Prolene.  There was good flow through the  graft.  Cardioplegia was redosed.  At this point, the left atrial clip was then applied to the base of the atrial appendage using a 35 mm clip, which did not interfere with the location of the vein grafts.  After cardioplegia was redosed, the LAD anastomosis was performed at the distal  third of the vessel using left IMA pedicle, which was brought through an opening in the left lateral pericardium.  It was brought down onto the LAD and sewn end-to-side with running 8-0 Prolene.  There was excellent flow through the anastomosis after  briefly releasing the pedicle bulldog and the mammary artery.  The bulldog was reapplied and the pedicle was secured.  Two proximal vein anastomoses were performed while the crossclamp remained in place using a 4.5 mm punch and running 6-0 Prolene.  Prior to tying down the final proximal anastomosis, the air was vented from the coronaries with a dose of retrograde warm  blood cardioplegia.  The crossclamp was removed.  The heart resumed a spontaneous rhythm.  He was in sinus rhythm.  An amiodarone drip was started.  The vein grafts were deaired and opened and each had good flow, and hemostasis was documented at the proximal and distal sites.  The patient was started on low dose milrinone and Neo-Synephrine.  Temporary pacing wires were applied.  The patient was  rewarmed and reperfused.  Lungs reexpanded.  Ventilator was resumed.  The patient was then  weaned off cardiopulmonary bypass with good biventricular function on echo and stable hemodynamics.  The cannula was removed and the heparin was administered without adverse reaction.  The mediastinum was irrigated.  The superior  pericardial fat was closed over the aorta.  The anterior mediastinum and left pleural chest tubes were placed.  The patient still had some diffuse coagulopathy.  This responded to a transfusion of FFP.  The sternum was closed with interrupted steel wire.  The pectoralis fascia was closed with a running #1 Vicryl.  Subcutaneous and skin layers were closed with running Vicryl and sterile dressings were applied.  Total cardiopulmonary bypass time was 120  minutes.  AN/NUANCE  D:03/23/2018 T:03/23/2018 JOB:[id number]

## 2018-03-23 NOTE — Anesthesia Postprocedure Evaluation (Signed)
Anesthesia Post Note  Patient: David Mccullough  Procedure(s) Performed: CORONARY ARTERY BYPASS GRAFTING (CABG) x 3 WITH ENDOSCOPIC HARVESTING OF RIGHT GREATER SAPHENOUS VEIN (N/A Chest) TRANSESOPHAGEAL ECHOCARDIOGRAM (TEE) (N/A ) LEFT ATRIAL APPENDAGE OCCLUSION USING 40 MM ATRICURE ATRICLIP (N/A )     Patient location during evaluation: ICU Anesthesia Type: General Level of consciousness: awake and alert Pain management: pain level controlled Vital Signs Assessment: post-procedure vital signs reviewed and stable Respiratory status: spontaneous breathing, nonlabored ventilation, respiratory function stable and patient connected to nasal cannula oxygen Cardiovascular status: stable (On multiple vasopressors for BP support) Postop Assessment: no apparent nausea or vomiting Anesthetic complications: no Comments: Patient with a dip in CI postoperatively, required additional vasopressors for stabilization. Extubated early AM on POD#1.     Last Vitals:  Vitals:   03/23/18 0630 03/23/18 0645  BP: 108/64   Pulse: 92 93  Resp: (!) 35 (!) 36  Temp: 37.3 C 37.2 C  SpO2: 99% 99%    Last Pain:  Vitals:   03/22/18 0411  TempSrc: Oral  PainSc:                  Audry Pili

## 2018-03-23 NOTE — Progress Notes (Signed)
Patient ID: David Mccullough, male   DOB: 1946/03/15, 72 y.o.   MRN: [id number] EVENING ROUNDS NOTE :     [address]       [place] [address]             [phone]                 1 Day Post-Op Procedure(s) (LRB): CORONARY ARTERY BYPASS GRAFTING (CABG) x 3 WITH ENDOSCOPIC HARVESTING OF RIGHT GREATER SAPHENOUS VEIN (N/A) TRANSESOPHAGEAL ECHOCARDIOGRAM (TEE) (N/A) LEFT ATRIAL APPENDAGE OCCLUSION USING 40 MM ATRICURE ATRICLIP (N/A)  Total Length of Stay:  LOS: 7 days  BP 114/69   Pulse 100   Temp 97.6 F (36.4 C) (Oral)   Resp (!) 25   Ht 6' (1.829 m)   Wt 99.9 kg   SpO2 95%   BMI 29.87 kg/m   .Intake/Output      09/25 0701 - 09/26 0700   P.O. 610   I.V. (mL/kg) 775.7 (7.8)   Blood    NG/GT    IV Piggyback    Total Intake(mL/kg) 1385.7 (13.9)   Urine (mL/kg/hr) 330 (0.3)   Emesis/NG output    Blood    Chest Tube 250   Total Output 580   Net +805.7         . sodium chloride Stopped (03/23/18 1019)  . sodium chloride    . sodium chloride 10 mL/hr at 03/22/18 1357  . cefUROXime (ZINACEF)  IV 1.5 g (03/23/18 0803)  . DOPamine 3 mcg/kg/min (03/23/18 1900)  . lactated ringers    . lactated ringers 20 mL/hr at 03/23/18 1900  . milrinone 0.25 mcg/kg/min (03/23/18 1900)  . nitroGLYCERIN Stopped (03/22/18 1317)  . norepinephrine (LEVOPHED) Adult infusion Stopped (03/23/18 1637)     Lab Results  Component Value Date   WBC 18.2 (H) 03/23/2018   HGB 9.9 (L) 03/23/2018   HCT 29.0 (L) 03/23/2018   PLT 214 03/23/2018   GLUCOSE 139 (H) 03/23/2018   CHOL 158 03/17/2018   TRIG 111 03/17/2018   HDL 36 (L) 03/17/2018   LDLCALC 100 (H) 03/17/2018   ALT 33 03/17/2018   AST 125 (H) 03/17/2018   NA 131 (L) 03/23/2018   K 4.4 03/23/2018   CL 99 03/23/2018   CREATININE 1.10 03/23/2018   BUN 19 03/23/2018   CO2 22 03/23/2018   TSH 1.977 03/17/2018   INR 1.39 03/22/2018   HGBA1C 5.2 03/17/2018   On 3 dopamine, milrinone increased today for cox  [place] MD  Beeper [phone] Office [phone] 03/23/2018 7:21 PM

## 2018-03-23 NOTE — Procedures (Signed)
Extubation Procedure Note  Patient Details:   Name: David Mccullough DOB: 1946/01/19 MRN: [id number]   Airway Documentation:    Vent end date: (not recorded) Vent end time: (not recorded)   Evaluation  O2 sats: stable throughout Complications: No apparent complications Patient did tolerate procedure well. Bilateral Breath Sounds: Clear   Yes   Vent end date 03/23/18 Vent end time 0540 VC 3.9 L NIF -28  Milinda Cave 03/23/2018, 5:59 AM

## 2018-03-24 ENCOUNTER — Inpatient Hospital Stay (HOSPITAL_COMMUNITY): Payer: Medicare Other

## 2018-03-24 ENCOUNTER — Inpatient Hospital Stay: Payer: Self-pay

## 2018-03-24 DIAGNOSIS — I340 Nonrheumatic mitral (valve) insufficiency: Secondary | ICD-10-CM

## 2018-03-24 LAB — COOXEMETRY PANEL
Carboxyhemoglobin: 1.4 % (ref 0.5–1.5)
Carboxyhemoglobin: 1.3 % (ref 0.5–1.5)
Methemoglobin: 1.6 % — ABNORMAL HIGH (ref 0.0–1.5)
Methemoglobin: 1.1 % (ref 0.0–1.5)
O2 Saturation: 55.1 %
O2 Saturation: 41.7 %
Total hemoglobin: 9.8 g/dL — ABNORMAL LOW (ref 12.0–16.0)
Total hemoglobin: 8.6 g/dL — ABNORMAL LOW (ref 12.0–16.0)

## 2018-03-24 LAB — CBC
HCT: 28.9 % — ABNORMAL LOW (ref 39.0–52.0)
Hemoglobin: 9.7 g/dL — ABNORMAL LOW (ref 13.0–17.0)
MCH: 29.8 pg (ref 26.0–34.0)
MCHC: 33.6 g/dL (ref 30.0–36.0)
MCV: 88.7 fL (ref 78.0–100.0)
Platelets: 181 10*3/uL (ref 150–400)
RBC: 3.26 MIL/uL — ABNORMAL LOW (ref 4.22–5.81)
RDW: 12.6 % (ref 11.5–15.5)
WBC: 13.2 10*3/uL — ABNORMAL HIGH (ref 4.0–10.5)

## 2018-03-24 LAB — BASIC METABOLIC PANEL
Anion gap: 7 (ref 5–15)
Anion gap: 12 (ref 5–15)
BUN: 24 mg/dL — ABNORMAL HIGH (ref 8–23)
BUN: 27 mg/dL — ABNORMAL HIGH (ref 8–23)
CO2: 24 mmol/L (ref 22–32)
CO2: 23 mmol/L (ref 22–32)
Calcium: 8.9 mg/dL (ref 8.9–10.3)
Calcium: 9 mg/dL (ref 8.9–10.3)
Chloride: 102 mmol/L (ref 98–111)
Chloride: 100 mmol/L (ref 98–111)
Creatinine, Ser: 1.35 mg/dL — ABNORMAL HIGH (ref 0.61–1.24)
Creatinine, Ser: 1.42 mg/dL — ABNORMAL HIGH (ref 0.61–1.24)
GFR calc Af Amer: 59 mL/min — ABNORMAL LOW (ref >60)
GFR calc Af Amer: 55 mL/min — ABNORMAL LOW (ref >60)
GFR calc non Af Amer: 50 mL/min — ABNORMAL LOW (ref >60)
GFR calc non Af Amer: 47 mL/min — ABNORMAL LOW (ref >60)
Glucose, Bld: 127 mg/dL — ABNORMAL HIGH (ref 70–99)
Glucose, Bld: 181 mg/dL — ABNORMAL HIGH (ref 70–99)
Potassium: 4.1 mmol/L (ref 3.5–5.1)
Potassium: 4.1 mmol/L (ref 3.5–5.1)
Sodium: 133 mmol/L — ABNORMAL LOW (ref 135–145)
Sodium: 135 mmol/L (ref 135–145)

## 2018-03-24 LAB — GLUCOSE, CAPILLARY
Glucose-Capillary: 115 mg/dL — ABNORMAL HIGH (ref 70–99)
Glucose-Capillary: 128 mg/dL — ABNORMAL HIGH (ref 70–99)
Glucose-Capillary: 128 mg/dL — ABNORMAL HIGH (ref 70–99)
Glucose-Capillary: 128 mg/dL — ABNORMAL HIGH (ref 70–99)
Glucose-Capillary: 148 mg/dL — ABNORMAL HIGH (ref 70–99)
Glucose-Capillary: 150 mg/dL — ABNORMAL HIGH (ref 70–99)
Glucose-Capillary: 168 mg/dL — ABNORMAL HIGH (ref 70–99)

## 2018-03-24 LAB — ECHOCARDIOGRAM LIMITED
Height: 72 in
Weight: 3601.43 oz

## 2018-03-24 LAB — MAGNESIUM: Magnesium: 2.3 mg/dL (ref 1.7–2.4)

## 2018-03-24 MED ORDER — MAGNESIUM SULFATE 2 GM/50ML IV SOLN
2.0000 g | Freq: Once | INTRAVENOUS | Status: AC
  Administered 2018-03-24: 2 g via INTRAVENOUS

## 2018-03-24 MED ORDER — PERFLUTREN LIPID MICROSPHERE
INTRAVENOUS | Status: AC
  Administered 2018-03-24: 3 mL via INTRAVENOUS
  Filled 2018-03-24: qty 10

## 2018-03-24 MED ORDER — FUROSEMIDE 10 MG/ML IJ SOLN
80.0000 mg | Freq: Every day | INTRAMUSCULAR | Status: DC
  Administered 2018-03-24 – 2018-03-25 (×2): 80 mg via INTRAVENOUS
  Filled 2018-03-24 (×2): qty 8

## 2018-03-24 MED ORDER — PERFLUTREN LIPID MICROSPHERE
1.0000 mL | INTRAVENOUS | Status: AC | PRN
  Administered 2018-03-24: 3 mL via INTRAVENOUS
  Filled 2018-03-24: qty 10

## 2018-03-24 MED ORDER — AMIODARONE HCL 200 MG PO TABS
400.0000 mg | ORAL_TABLET | Freq: Two times a day (BID) | ORAL | Status: DC
  Administered 2018-03-24 – 2018-03-26 (×5): 400 mg via ORAL
  Filled 2018-03-24 (×5): qty 2

## 2018-03-24 MED ORDER — AMIODARONE HCL IN DEXTROSE 360-4.14 MG/200ML-% IV SOLN
INTRAVENOUS | Status: AC
  Filled 2018-03-24: qty 200

## 2018-03-24 MED ORDER — LIDOCAINE IN D5W 4-5 MG/ML-% IV SOLN
1.0000 mg/min | INTRAVENOUS | Status: DC
  Administered 2018-03-24: 2 mg/min via INTRAVENOUS

## 2018-03-24 MED ORDER — LIDOCAINE BOLUS VIA INFUSION
100.0000 mg | Freq: Once | INTRAVENOUS | Status: AC
  Administered 2018-03-24: 100 mg via INTRAVENOUS
  Filled 2018-03-24: qty 100

## 2018-03-24 NOTE — Progress Notes (Signed)
  Echocardiogram 2D Echocardiogram limited with definity has been performed.  David Mccullough M 03/24/2018, 3:45 PM

## 2018-03-24 NOTE — Progress Notes (Signed)
Patient ID: David Mccullough, male   DOB: 07/15/1945, 71 y.o.   MRN: [id number] TCTS Evening Rounds:  Hemodynamically stable on milrinone 0.125 and dop 2  Had Torsades earlier today and started on lidocaine 2 mg. K+ 4.1, Mg2+ 2.3  Urine output good.  Plan to wean dopamine.

## 2018-03-24 NOTE — Progress Notes (Addendum)
[address]       [place] [address]             [phone]      2 Days Post-Op Procedure(s) (LRB): CORONARY ARTERY BYPASS GRAFTING (CABG) x 3 WITH ENDOSCOPIC HARVESTING OF RIGHT GREATER SAPHENOUS VEIN (N/A) TRANSESOPHAGEAL ECHOCARDIOGRAM (TEE) (N/A) LEFT ATRIAL APPENDAGE OCCLUSION USING 40 MM ATRICURE ATRICLIP (N/A) Subjective: conts to feels pretty well, no specific complaints, very stoic  Objective: Vital signs in last 24 hours: Temp:  [97.6 F (36.4 C)-98.9 F (37.2 C)] 98.9 F (37.2 C) (09/26 0401) Pulse Rate:  [86-116] 99 (09/26 0700) Cardiac Rhythm: Normal sinus rhythm (09/26 0430) Resp:  [12-42] 12 (09/26 0700) BP: (96-131)/(61-80) 96/61 (09/26 0700) SpO2:  [90 %-99 %] 90 % (09/26 0700) Arterial Line BP: (95-165)/(43-76) 101/47 (09/26 0700) Weight:  [102.1 kg] 102.1 kg (09/26 0500)  Hemodynamic parameters for last 24 hours: PAP: (32-40)/(13-21) 32/17 CVP:  [12 mmHg] 12 mmHg  Intake/Output from previous day: 09/25 0701 - 09/26 0700 In: 1731.3 [P.O.:610; I.V.:1121.3] Out: 1495 [Urine:1085; Chest Tube:410] Intake/Output this shift: No intake/output data recorded.  General appearance: alert, cooperative and no distress Heart: regular rate and rhythm Lungs: dim in L>R lower fields Abdomen: mild distension, nontender, + BS Extremities: minor edema Wound: dressings CDI  Lab Results: Recent Labs    03/23/18 1731 03/23/18 1736 03/24/18 0430  WBC 18.2*  --  13.2*  HGB 10.5* 9.9* 9.7*  HCT 31.2* 29.0* 28.9*  PLT 214  --  181   BMET:  Recent Labs    03/23/18 0300  03/23/18 1736 03/24/18 0430  NA 134*  --  131* 133*  K 4.3  --  4.4 4.1  CL 104  --  99 102  CO2 22  --   --  24  GLUCOSE 114*  --  139* 127*  BUN 16  --  19 24*  CREATININE 0.95   < > 1.10 1.35*  CALCIUM 8.6*  --   --  8.9   < > = values in this interval not displayed.    PT/INR:  Recent Labs    03/22/18 1324  LABPROT 16.9*  INR 1.39   ABG    Component  Value Date/Time   PHART 7.408 03/23/2018 0642   HCO3 21.9 03/23/2018 0642   TCO2 21 (L) 03/23/2018 1736   ACIDBASEDEF 2.0 03/23/2018 0642   O2SAT 55.1 03/24/2018 0420   CBG (last 3)  Recent Labs    03/23/18 2019 03/24/18 0011 03/24/18 0403  GLUCAP 169* 128* 128*    Meds Scheduled Meds: . acetaminophen  1,000 mg Oral Q6H   Or  . acetaminophen (TYLENOL) oral liquid 160 mg/5 mL  1,000 mg Per Tube Q6H  . amiodarone  400 mg Oral BID  . aspirin EC  325 mg Oral Daily   Or  . aspirin  324 mg Per Tube Daily  . atorvastatin  80 mg Oral q1800  . bisacodyl  10 mg Oral Daily   Or  . bisacodyl  10 mg Rectal Daily  . Chlorhexidine Gluconate Cloth  6 each Topical Daily  . docusate sodium  200 mg Oral Daily  . enoxaparin (LOVENOX) injection  40 mg Subcutaneous QHS  . furosemide  40 mg Intravenous Daily  . insulin aspart  0-24 Units Subcutaneous Q4H  . insulin detemir  10 Units Subcutaneous BID  . metoCLOPramide (REGLAN) injection  10 mg Intravenous Q6H  . mupirocin cream   Topical  BID  . pantoprazole  40 mg Oral Daily  . sodium chloride flush  10-40 mL Intracatheter Q12H  . sodium chloride flush  3 mL Intravenous Q12H  . traZODone  50 mg Oral QHS   Continuous Infusions: . sodium chloride Stopped (03/23/18 1019)  . sodium chloride    . sodium chloride 10 mL/hr at 03/24/18 0600  . cefUROXime (ZINACEF)  IV 1.5 g (03/23/18 2140)  . DOPamine 3 mcg/kg/min (03/24/18 0600)  . lactated ringers    . lactated ringers 20 mL/hr at 03/24/18 0600  . milrinone 0.25 mcg/kg/min (03/24/18 0600)  . nitroGLYCERIN Stopped (03/22/18 1317)  . norepinephrine (LEVOPHED) Adult infusion Stopped (03/23/18 1637)   PRN Meds:.sodium chloride, metoprolol tartrate, midazolam, morphine injection, ondansetron (ZOFRAN) IV, oxyCODONE, sodium chloride flush, sodium chloride flush, traMADol  Xrays Dg Chest Port 1 View  Result Date: 03/23/2018 CLINICAL DATA:  Follow-up chest tube following cardiac surgery EXAM:  PORTABLE CHEST 1 VIEW COMPARISON:  03/22/2018 FINDINGS: Endotracheal tube and nasogastric catheter have been removed in the interval. Postsurgical changes are again seen. Swan-Ganz catheter, mediastinal drain and left thoracostomy catheter are again seen. No pneumothorax is noted. Left basilar atelectasis is seen increased from the prior exam. No bony abnormality is noted. IMPRESSION: Slight increase in left basilar atelectasis. Tubes and lines as described without complicating factors. Electronically Signed   By: Inez Catalina M.D.   On: 03/23/2018 11:04   Dg Chest Port 1 View  Result Date: 03/22/2018 CLINICAL DATA:  Postop CABG EXAM: PORTABLE CHEST 1 VIEW COMPARISON:  03/20/2018 FINDINGS: Changes of CABG. Swan-Ganz catheter tip is in the main pulmonary artery. Endotracheal tube is 5 cm above the carina. NG tube enters the stomach. Left chest tube pull in place. No pneumothorax. Minimal bibasilar atelectasis. Mild cardiomegaly. IMPRESSION: Postoperative changes from CABG. No visible pneumothorax. Bibasilar atelectasis. Electronically Signed   By: Rolm Baptise M.D.   On: 03/22/2018 13:37    Assessment/Plan: S/P Procedure(s) (LRB): CORONARY ARTERY BYPASS GRAFTING (CABG) x 3 WITH ENDOSCOPIC HARVESTING OF RIGHT GREATER SAPHENOUS VEIN (N/A) TRANSESOPHAGEAL ECHOCARDIOGRAM (TEE) (N/A) LEFT ATRIAL APPENDAGE OCCLUSION USING 40 MM ATRICURE ATRICLIP (N/A)  1 conts to progress well, on dopamine with BP in 90-low 100's, renal dose currently, creat is slightly up at 1.35, continue. UOP is adequate but weight is up further with some LE edema- will need some diuretic at some point. Levophed is off. Nitro is off, Milrinone is .25 mcg. Coox is 55.1- wean as able. 2 Chest tubes 430 yesterday, 100 so far today- leave in place for now 3 leukocytosis is improved 4 H/H is down some- cont to monitor closely, platelet count trend is lower- cont to follow 5 BS adeq control 6 NSR on po amio with h/o persist atrial fib-  continue 7 advance activity as able, cont pulm toilet/IS  LOS: 8 days    David Mccullough 03/24/2018 Pager [phone]  Slow progress DC MT, lasix 80 mg daily Wean mil  to 0.125  patient examined and medical record reviewed,agree with above note. [name] 03/24/2018

## 2018-03-25 ENCOUNTER — Inpatient Hospital Stay: Payer: Self-pay

## 2018-03-25 ENCOUNTER — Inpatient Hospital Stay (HOSPITAL_COMMUNITY): Payer: Medicare Other

## 2018-03-25 DIAGNOSIS — I5031 Acute diastolic (congestive) heart failure: Secondary | ICD-10-CM

## 2018-03-25 DIAGNOSIS — I472 Ventricular tachycardia: Secondary | ICD-10-CM

## 2018-03-25 DIAGNOSIS — Z951 Presence of aortocoronary bypass graft: Secondary | ICD-10-CM

## 2018-03-25 LAB — BASIC METABOLIC PANEL
Anion gap: 12 (ref 5–15)
BUN: 36 mg/dL — ABNORMAL HIGH (ref 8–23)
CO2: 23 mmol/L (ref 22–32)
Calcium: 9 mg/dL (ref 8.9–10.3)
Chloride: 98 mmol/L (ref 98–111)
Creatinine, Ser: 1.54 mg/dL — ABNORMAL HIGH (ref 0.61–1.24)
GFR calc Af Amer: 50 mL/min — ABNORMAL LOW (ref >60)
GFR calc non Af Amer: 43 mL/min — ABNORMAL LOW (ref >60)
Glucose, Bld: 122 mg/dL — ABNORMAL HIGH (ref 70–99)
Potassium: 3.7 mmol/L (ref 3.5–5.1)
Sodium: 133 mmol/L — ABNORMAL LOW (ref 135–145)

## 2018-03-25 LAB — BPAM RBC
Blood Product Expiration Date: 201910122359
Blood Product Expiration Date: 201910132359
Blood Product Expiration Date: 201910132359
Blood Product Expiration Date: 201910132359
ISSUE DATE / TIME: 201909240956
ISSUE DATE / TIME: 201909240956
Unit Type and Rh: 7300
Unit Type and Rh: 7300
Unit Type and Rh: 7300
Unit Type and Rh: 7300

## 2018-03-25 LAB — TYPE AND SCREEN
ABO/RH(D): B POS
Antibody Screen: NEGATIVE
Unit division: 0
Unit division: 0
Unit division: 0
Unit division: 0

## 2018-03-25 LAB — GLUCOSE, CAPILLARY
Glucose-Capillary: 141 mg/dL — ABNORMAL HIGH (ref 70–99)
Glucose-Capillary: 129 mg/dL — ABNORMAL HIGH (ref 70–99)
Glucose-Capillary: 130 mg/dL — ABNORMAL HIGH (ref 70–99)
Glucose-Capillary: 122 mg/dL — ABNORMAL HIGH (ref 70–99)
Glucose-Capillary: 105 mg/dL — ABNORMAL HIGH (ref 70–99)

## 2018-03-25 LAB — COMPREHENSIVE METABOLIC PANEL
ALT: 32 U/L (ref 0–44)
AST: 25 U/L (ref 15–41)
Albumin: 2.5 g/dL — ABNORMAL LOW (ref 3.5–5.0)
Alkaline Phosphatase: 56 U/L (ref 38–126)
Anion gap: 9 (ref 5–15)
BUN: 31 mg/dL — ABNORMAL HIGH (ref 8–23)
CO2: 25 mmol/L (ref 22–32)
Calcium: 9 mg/dL (ref 8.9–10.3)
Chloride: 98 mmol/L (ref 98–111)
Creatinine, Ser: 1.27 mg/dL — ABNORMAL HIGH (ref 0.61–1.24)
GFR calc Af Amer: >60 mL/min (ref >60)
GFR calc non Af Amer: 54 mL/min — ABNORMAL LOW (ref >60)
Glucose, Bld: 144 mg/dL — ABNORMAL HIGH (ref 70–99)
Potassium: 3.6 mmol/L (ref 3.5–5.1)
Sodium: 132 mmol/L — ABNORMAL LOW (ref 135–145)
Total Bilirubin: 0.7 mg/dL (ref 0.3–1.2)
Total Protein: 5.3 g/dL — ABNORMAL LOW (ref 6.5–8.1)

## 2018-03-25 LAB — COOXEMETRY PANEL
Carboxyhemoglobin: 1.1 % (ref 0.5–1.5)
Carboxyhemoglobin: 1 % (ref 0.5–1.5)
Carboxyhemoglobin: 1.4 % (ref 0.5–1.5)
Methemoglobin: 1.7 % — ABNORMAL HIGH (ref 0.0–1.5)
Methemoglobin: 1.6 % — ABNORMAL HIGH (ref 0.0–1.5)
Methemoglobin: 2 % — ABNORMAL HIGH (ref 0.0–1.5)
O2 Saturation: 47.4 %
O2 Saturation: 42.1 %
O2 Saturation: 50.7 %
Total hemoglobin: 11.1 g/dL — ABNORMAL LOW (ref 12.0–16.0)
Total hemoglobin: 15.4 g/dL (ref 12.0–16.0)
Total hemoglobin: 8.9 g/dL — ABNORMAL LOW (ref 12.0–16.0)

## 2018-03-25 LAB — CBC
HCT: 27.4 % — ABNORMAL LOW (ref 39.0–52.0)
Hemoglobin: 9 g/dL — ABNORMAL LOW (ref 13.0–17.0)
MCH: 29 pg (ref 26.0–34.0)
MCHC: 32.8 g/dL (ref 30.0–36.0)
MCV: 88.4 fL (ref 78.0–100.0)
Platelets: 205 10*3/uL (ref 150–400)
RBC: 3.1 MIL/uL — ABNORMAL LOW (ref 4.22–5.81)
RDW: 12.7 % (ref 11.5–15.5)
WBC: 10 10*3/uL (ref 4.0–10.5)

## 2018-03-25 MED ORDER — SODIUM CHLORIDE 0.9 % IV SOLN
INTRAVENOUS | Status: DC | PRN
  Administered 2018-03-25: 500 mL via INTRAVENOUS

## 2018-03-25 MED ORDER — POTASSIUM CHLORIDE 10 MEQ/50ML IV SOLN
10.0000 meq | Freq: Once | INTRAVENOUS | Status: AC
  Administered 2018-03-25: 10 meq via INTRAVENOUS

## 2018-03-25 MED ORDER — METOLAZONE 5 MG PO TABS
5.0000 mg | ORAL_TABLET | Freq: Every day | ORAL | Status: AC
  Administered 2018-03-25 – 2018-03-26 (×2): 5 mg via ORAL
  Filled 2018-03-25 (×2): qty 1

## 2018-03-25 MED ORDER — INSULIN ASPART 100 UNIT/ML ~~LOC~~ SOLN
0.0000 [IU] | Freq: Three times a day (TID) | SUBCUTANEOUS | Status: DC
  Administered 2018-03-25 – 2018-03-29 (×8): 2 [IU] via SUBCUTANEOUS

## 2018-03-25 MED ORDER — SORBITOL 70 % SOLN
60.0000 mL | Freq: Once | Status: AC
  Administered 2018-03-25: 60 mL via ORAL

## 2018-03-25 MED ORDER — SPIRONOLACTONE 12.5 MG HALF TABLET
12.5000 mg | ORAL_TABLET | Freq: Every day | ORAL | Status: DC
  Administered 2018-03-25 – 2018-03-30 (×6): 12.5 mg via ORAL
  Filled 2018-03-25 (×6): qty 1

## 2018-03-25 MED ORDER — INSULIN DETEMIR 100 UNIT/ML ~~LOC~~ SOLN
10.0000 [IU] | Freq: Every day | SUBCUTANEOUS | Status: DC
  Administered 2018-03-25 – 2018-03-28 (×4): 10 [IU] via SUBCUTANEOUS
  Filled 2018-03-25 (×5): qty 0.1

## 2018-03-25 MED ORDER — FUROSEMIDE 10 MG/ML IJ SOLN
80.0000 mg | Freq: Two times a day (BID) | INTRAMUSCULAR | Status: DC
  Administered 2018-03-25 – 2018-03-26 (×3): 80 mg via INTRAVENOUS
  Filled 2018-03-25 (×3): qty 8

## 2018-03-25 MED ORDER — POTASSIUM CHLORIDE CRYS ER 20 MEQ PO TBCR
40.0000 meq | EXTENDED_RELEASE_TABLET | Freq: Once | ORAL | Status: AC
  Administered 2018-03-25: 40 meq via ORAL
  Filled 2018-03-25: qty 2

## 2018-03-25 MED ORDER — SORBITOL 70 % PO SOLN
60.0000 mL | Freq: Once | ORAL | Status: DC
  Filled 2018-03-25 (×2): qty 60

## 2018-03-25 MED ORDER — POTASSIUM CHLORIDE 10 MEQ/50ML IV SOLN
10.0000 meq | INTRAVENOUS | Status: DC | PRN
  Administered 2018-03-25: 10 meq via INTRAVENOUS
  Filled 2018-03-25: qty 50

## 2018-03-25 MED ORDER — SODIUM CHLORIDE 0.9% FLUSH
10.0000 mL | Freq: Two times a day (BID) | INTRAVENOUS | Status: DC
  Administered 2018-03-25: 30 mL
  Administered 2018-03-26 – 2018-03-29 (×5): 10 mL

## 2018-03-25 MED ORDER — SODIUM CHLORIDE 0.9% FLUSH: 10.0000 mL | INTRAVENOUS | Status: DC | PRN

## 2018-03-25 MED ORDER — CARVEDILOL 3.125 MG PO TABS
3.1250 mg | ORAL_TABLET | Freq: Two times a day (BID) | ORAL | Status: DC
  Filled 2018-03-25: qty 1

## 2018-03-25 NOTE — Progress Notes (Addendum)
[address]       [place] [address]             [phone]      3 Days Post-Op Procedure(s) (LRB): CORONARY ARTERY BYPASS GRAFTING (CABG) x 3 WITH ENDOSCOPIC HARVESTING OF RIGHT GREATER SAPHENOUS VEIN (N/A) TRANSESOPHAGEAL ECHOCARDIOGRAM (TEE) (N/A) LEFT ATRIAL APPENDAGE OCCLUSION USING 40 MM ATRICURE ATRICLIP (N/A) Subjective: conts to feel pretty well  Objective: Vital signs in last 24 hours: Temp:  [97.5 F (36.4 C)-99.2 F (37.3 C)] 98.3 F (36.8 C) (09/27 0811) Pulse Rate:  [92-110] 103 (09/27 0700) Cardiac Rhythm: Normal sinus rhythm (09/27 0400) Resp:  [15-35] 19 (09/27 0700) BP: (84-144)/(58-82) 104/67 (09/27 0700) SpO2:  [90 %-99 %] 90 % (09/27 0700) Weight:  [101.3 kg] 101.3 kg (09/27 0500)  Hemodynamic parameters for last 24 hours:    Intake/Output from previous day: 09/26 0701 - 09/27 0700 In: 2110.9 [P.O.:460; I.V.:1591.2; IV Piggyback:59.7] Out: 2135 [Urine:1865; Chest Tube:270] Intake/Output this shift: Total I/O In: 75.7 [I.V.:35.4; IV Piggyback:40.3] Out: 35 [Urine:35]  General appearance: alert, cooperative and no distress Heart: regular rate and rhythm Lungs: mildly dim in bases Abdomen: soft, non-tender Extremities: + LE edema Wound: evh site ok, chest dressing CDI  Lab Results: Recent Labs    03/24/18 0430 03/25/18 0416  WBC 13.2* 10.0  HGB 9.7* 9.0*  HCT 28.9* 27.4*  PLT 181 205   BMET:  Recent Labs    03/24/18 1159 03/25/18 0416  NA 135 132*  K 4.1 3.6  CL 100 98  CO2 23 25  GLUCOSE 181* 144*  BUN 27* 31*  CREATININE 1.42* 1.27*  CALCIUM 9.0 9.0    PT/INR:  Recent Labs    03/22/18 1324  LABPROT 16.9*  INR 1.39   ABG    Component Value Date/Time   PHART 7.408 03/23/2018 0642   HCO3 21.9 03/23/2018 0642   TCO2 21 (L) 03/23/2018 1736   ACIDBASEDEF 2.0 03/23/2018 0642   O2SAT 47.4 03/25/2018 0430   CBG (last 3)  Recent Labs    03/24/18 2021 03/24/18 2332 03/25/18 0421  GLUCAP 128*  115* 141*    Meds Scheduled Meds: . acetaminophen  1,000 mg Oral Q6H   Or  . acetaminophen (TYLENOL) oral liquid 160 mg/5 mL  1,000 mg Per Tube Q6H  . amiodarone  400 mg Oral BID  . aspirin EC  325 mg Oral Daily   Or  . aspirin  324 mg Per Tube Daily  . atorvastatin  80 mg Oral q1800  . bisacodyl  10 mg Oral Daily   Or  . bisacodyl  10 mg Rectal Daily  . carvedilol  3.125 mg Oral BID WC  . Chlorhexidine Gluconate Cloth  6 each Topical Daily  . docusate sodium  200 mg Oral Daily  . enoxaparin (LOVENOX) injection  40 mg Subcutaneous QHS  . furosemide  80 mg Intravenous Daily  . insulin aspart  0-24 Units Subcutaneous TID WC & HS  . insulin detemir  10 Units Subcutaneous Daily  . metoCLOPramide (REGLAN) injection  10 mg Intravenous Q6H  . mupirocin cream   Topical BID  . pantoprazole  40 mg Oral Daily  . sodium chloride flush  10-40 mL Intracatheter Q12H  . sodium chloride flush  3 mL Intravenous Q12H  . sorbitol  60 mL Oral Once  . traZODone  50 mg Oral QHS   Continuous Infusions: . sodium chloride 500 mL (03/25/18 0818)  . milrinone  0.25 mcg/kg/min (03/25/18 0800)  . potassium chloride     PRN Meds:.sodium chloride, ondansetron (ZOFRAN) IV, oxyCODONE, sodium chloride flush, sodium chloride flush, traMADol  Xrays Dg Chest Port 1 View  Result Date: 03/25/2018 CLINICAL DATA:  Follow-up chest tube placement EXAM: PORTABLE CHEST 1 VIEW COMPARISON:  03/24/2018 FINDINGS: Cardiac shadow is enlarged but stable. Postsurgical changes are again seen. Left thoracostomy catheter is again noted. No pneumothorax is seen. Right jugular sheath is noted but kinked at the skin surface stable from the prior exam. Bibasilar atelectatic changes are again seen. IMPRESSION: Overall stable appearance of the chest with bibasilar atelectasis. Electronically Signed   By: Inez Catalina M.D.   On: 03/25/2018 07:41   Dg Chest Port 1 View  Result Date: 03/24/2018 CLINICAL DATA:  72 year old male with a  history of coronary bypass graft EXAM: PORTABLE CHEST 1 VIEW COMPARISON:  03/23/2018 FINDINGS: Cardiomediastinal silhouette unchanged in size and contour, with similar with of the upper mediastinum. Surgical changes of median sternotomy and CABG. Surgical changes of atrial amputation/clipping. Right IJ sheath remains in position with interval withdrawal of the Swan-Ganz catheter. There is been kinking of the sheath at the insertion site. Unchanged position of mediastinal drains/left pleural drain/chest tubes. No visualized pneumothorax. Epicardial pacing leads in place. Linear opacities at the bilateral lung bases. IMPRESSION: Early changes of CABG, with no pneumothorax, and basilar atelectasis. Interval withdrawal of the Swan-Ganz catheter, with unchanged position of right IJ sheath. Note that there is kinking at the site of insertion. Unchanged position of mediastinal/pleural drains, and epicardial pacing leads. Electronically Signed   By: Corrie Mckusick D.O.   On: 03/24/2018 09:34   [place] Ekg Site Rite  Result Date: 03/25/2018 If Site Rite image not attached, placement could not be confirmed due to current cardiac rhythm.  [place] Ekg Site Rite  Result Date: 03/24/2018 If Site Rite image not attached, placement could not be confirmed due to current cardiac rhythm.   Assessment/Plan: S/P Procedure(s) (LRB): CORONARY ARTERY BYPASS GRAFTING (CABG) x 3 WITH ENDOSCOPIC HARVESTING OF RIGHT GREATER SAPHENOUS VEIN (N/A) TRANSESOPHAGEAL ECHOCARDIOGRAM (TEE) (N/A) LEFT ATRIAL APPENDAGE OCCLUSION USING 40 MM ATRICURE ATRICLIP (N/A)   1 conts to progress 2 COOX 47 on .25 milrinone, other gtts are off. PICC  line to be placed today to get swan sheath out . In sinus rhythm, Had torsades, received magnesium, lidocaine now off 3 chest drainage conts to decrease, chest tubes to be removed today 4 creat improved, cont to diurese 5 H/H pretty stable- cont to monitor 6 CBG's well controled 7 cont pulm toilet/rehab as  able    LOS: 9 days    John Giovanni 03/25/2018 pqger [phone]  Patient's echocardiogram yesterday showed normal LVEF 55-60% Patient maintaining sinus rhythm Mixed venous saturation however remains low from possible diastolic dysfunction.  Advanced heart failure team consulted to optimize medical management.  Continue milrinone for now.  He will need oral anticoagulation started prior to his discharge.  patient examined and medical record reviewed,agree with above note. [name] 03/25/2018

## 2018-03-25 NOTE — Consult Note (Addendum)
Advanced Heart Failure Team Consult Note   Primary Physician: Patient, No Pcp Per PCP-Cardiologist:  No primary care provider on file.  Reason for Consultation: Heart Failure   HPI:    Marlee Trentman is seen today for evaluation of heart failure at the request of Dr Darcey Nora.   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    Admitted to [facility] on 9/16 with chest pain.  NSTEM and A fib. LHC performed on 9/17 with severe multivessel disease. PCI was unsuccessful. He was transferred to [facility] on 9/18 for CT surgery consultation. On 9/24 he underwent CABG x3. All drips have been weaned off except milrinone. Over the last 48 hours CO- OX have been low  41% and 47%. Earlier today milrinone was increased to 0.25 mcg. Diuresing with IV lasix.   PICC placed today. CO-OX 42%. He has been walking in the unit without difficulty. Respirations have been in the 30s. He denies pain. Main complaint is constipation.    Echo 03/24/2018   - Limited study, however, there is no significant change since the   last study on 03/22/2018, LVEF reamins normal at 55-60% with no   regional wall motion abnormalities. -Left ventricle: The cavity size was normal. There was mild   concentric hypertrophy. Systolic function was normal. The   estimated ejection fraction was in the range of 55% to 60%. Wall   motion was normal; there were no regional wall motion   abnormalities. Left ventricular diastolic function parameters   were normal. - Mitral valve: There was mild regurgitation. Valve area by   pressure half-time: 1.98 cm^2. - Left atrium: The atrium was moderately dilated. - Right ventricle: The cavity size was normal. Wall thickness was   normal. Systolic function was normal. - Pulmonary arteries: Systolic pressure was mildly increased. PA   peak pressure: 37 mm Hg (S). - Pericardium, extracardiac: A mild  pericardial effusion was   identified lateral to the left ventricle. Features were not   consistent with tamponade physiology.  ECHO 03/22/2018 - Left ventricle: The cavity size was normal. Wall thickness was   normal. Systolic function was normal. The estimated ejection   fraction was in the range of 55% to 60%. Wall motion was normal;   there were no regional wall motion abnormalities. The study is   not technically sufficient to allow evaluation of LV diastolic   function. - Mitral valve: There was mild regurgitation. - Right ventricle: Measured TAPSE and tissue anular velocity would   suggest RV systolic function is moderately decreased. - Right atrium: Poorly visualized. Grossly appears enlarged. - Pericardium, extracardiac: There is no clear pericardial   effusion. The pericardium adjacent to the RA and RV is poorly   visualized. From what is visualized there is no clear effusion,   no evidence of RA or RV collapse. - Technically difficult and limited study. Echocontrast was used to   enhance visualization.   Review of Systems: [y] = yes, [ ]  = no   General: Weight gain [ Y]; Weight loss [ ] ; Anorexia [ ] ; Fatigue [ ] ; Fever [ ] ; Chills [ ] ; Weakness [ ]   Cardiac: Chest pain/pressure [ ] ; Resting SOB [ ] ; Exertional SOB [ ] ; Orthopnea [ ] ; Pedal Edema [ ] ; Palpitations [ ] ; Syncope [ ] ; Presyncope [ ] ; Paroxysmal nocturnal dyspnea[ ]   Pulmonary: Cough [ ] ; Wheezing[ ] ;  Hemoptysis[ ] ; Sputum [ ] ; Snoring [ ]   GI: Vomiting[ ] ; Dysphagia[ ] ; Melena[ ] ; Hematochezia [ ] ; Heartburn[ ] ; Abdominal pain [ ] ; Constipation [Y ]; Diarrhea [ ] ; BRBPR [ ]   GU: Hematuria[ ] ; Dysuria [ ] ; Nocturia[ ]   Vascular: Pain in legs with walking [ ] ; Pain in feet with lying flat [ ] ; Non-healing sores [ ] ; Stroke [ ] ; TIA [ ] ; Slurred speech [ ] ;  Neuro: Headaches[ ] ; Vertigo[ ] ; Seizures[ ] ; Paresthesias[ ] ;Blurred vision [ ] ; Diplopia [ ] ; Vision changes [ ]   Ortho/Skin: Arthritis [ ] ; Joint pain [  Y]; Muscle pain [ ] ; Joint swelling [ ] ; Back Pain [ ] ; Rash [ ]   Psych: Depression[ ] ; Anxiety[ ]   Heme: Bleeding problems [ ] ; Clotting disorders [ ] ; Anemia [ ]   Endocrine: Diabetes [ ] ; Thyroid dysfunction[ ]   Home Medications Prior to Admission medications   Medication Sig Start Date End Date Taking? Authorizing Provider  aspirin 81 MG chewable tablet Chew 81 mg by mouth at bedtime.     [provider]  atorvastatin (LIPITOR) 40 MG tablet Take 1 tablet (40 mg total) by mouth daily at 6 PM. 03/16/18   Vaughan Basta, MD  Calcium Carb-Cholecalciferol (CALCIUM 1000 + D PO) Take 1 tablet by mouth daily.     [provider]  clopidogrel (PLAVIX) 300 MG TABS tablet Take 2 tablets (600 mg total) by mouth before cath procedure. 03/17/18   Vaughan Basta, MD  clopidogrel (PLAVIX) 75 MG tablet Take 1 tablet (75 mg total) by mouth daily with breakfast. 03/17/18   Vaughan Basta, MD  heparin 100-0.45 UNIT/ML-% infusion Inject 1,500 Units/hr into the vein continuous. 03/16/18   Vaughan Basta, MD  metoprolol tartrate (LOPRESSOR) 25 MG tablet Take 0.5 tablets (12.5 mg total) by mouth 2 (two) times daily. 03/16/18   Vaughan Basta, MD  Multiple Vitamin (MULTIVITAMIN) tablet Take 1 tablet by mouth daily.    [provider]  nitroGLYCERIN (NITROSTAT) 0.4 MG SL tablet Place 1 tablet (0.4 mg total) under the tongue every 5 (five) minutes x 3 doses as needed for chest pain. 03/16/18   Vaughan Basta, MD  Omega-3 Fatty Acids (FISH OIL) 1200 MG CAPS Take 1 capsule by mouth daily.     [provider]  traZODone (DESYREL) 50 MG tablet Take 50 mg by mouth at bedtime.     [provider]    Past Medical History: Past Medical History:  Diagnosis Date  . CAD (coronary artery disease)   . Persistent atrial fibrillation ([place])   . Tachycardia     Past Surgical History: Past Surgical History:  Procedure Laterality Date  .  CARDIAC ELECTROPHYSIOLOGY STUDY AND ABLATION     patient states due to V-tac  . CORONARY ARTERY BYPASS GRAFT N/A 03/22/2018   Procedure: CORONARY ARTERY BYPASS GRAFTING (CABG) x 3 WITH ENDOSCOPIC HARVESTING OF RIGHT GREATER SAPHENOUS VEIN;  Surgeon: Ivin Poot, MD;  Location: [place];  Service: Open Heart Surgery;  Laterality: N/A;  . CORONARY STENT INTERVENTION N/A 03/16/2018   Procedure: CORONARY STENT INTERVENTION;  Surgeon: Yolonda Kida, MD;  Location: [facility] [facility];  Service: Cardiovascular;  Laterality: N/A;  . LEFT ATRIAL APPENDAGE OCCLUSION N/A 03/22/2018   Procedure: LEFT ATRIAL APPENDAGE OCCLUSION USING 40 MM ATRICURE ATRICLIP;  Surgeon: Ivin Poot, MD;  Location: [place];  Service: Open Heart Surgery;  Laterality: N/A;  . LEFT HEART CATH AND CORONARY ANGIOGRAPHY N/A 03/15/2018   Procedure: LEFT HEART  CATH AND CORONARY ANGIOGRAPHY and PCI stent;  Surgeon: Yolonda Kida, MD;  Location: [place] [facility];  Service: Cardiovascular;  Laterality: N/A;  . TEE WITHOUT CARDIOVERSION N/A 03/22/2018   Procedure: TRANSESOPHAGEAL ECHOCARDIOGRAM (TEE);  Surgeon: Prescott Gum, Collier Salina, MD;  Location: [place];  Service: Open Heart Surgery;  Laterality: N/A;    Family History: Family History  Problem Relation Age of Onset  . Heart disease Mother   . Heart disease Father   . Arrhythmia Sister     Social History: Social History   Socioeconomic History  . Marital status: Unknown    Spouse name: Not on file  . Number of children: Not on file  . Years of education: Not on file  . Highest education level: Not on file  Occupational History  . Not on file  Social Needs  . Financial resource strain: Not on file  . Food insecurity:    Worry: Not on file    Inability: Not on file  . Transportation needs:    Medical: Not on file    Non-medical: Not on file  Tobacco Use  . Smoking status: Former Smoker    Packs/day: 1.00    Years: 25.00    Pack years: 25.00    Types:  Cigarettes    Last attempt to quit: 06/30/1987    Years since quitting: 30.7  . Smokeless tobacco: Never Used  Substance and Sexual Activity  . Alcohol use: Not Currently  . Drug use: Not Currently  . Sexual activity: Not on file  Lifestyle  . Physical activity:    Days per week: Not on file    Minutes per session: Not on file  . Stress: Not on file  Relationships  . Social connections:    Talks on phone: Not on file    Gets together: Not on file    Attends religious service: Not on file    Active member of club or organization: Not on file    Attends meetings of clubs or organizations: Not on file    Relationship status: Not on file  Other Topics Concern  . Not on file  Social History Narrative  . Not on file    Allergies:  No Known Allergies  Objective:    Vital Signs:   Temp:  [97.5 F (36.4 C)-99.2 F (37.3 C)] 98.6 F (37 C) (09/27 1143) Pulse Rate:  [92-110] 98 (09/27 1300) Resp:  [17-35] 31 (09/27 1300) BP: (87-144)/(58-87) 130/80 (09/27 1300) SpO2:  [90 %-99 %] 98 % (09/27 1300) Weight:  [101.3 kg] 101.3 kg (09/27 0500) Last BM Date: (UTA)  Weight change: Filed Weights   03/23/18 0500 03/24/18 0500 03/25/18 0500  Weight: 99.9 kg 102.1 kg 101.3 kg    Intake/Output:   Intake/Output Summary (Last 24 hours) at 03/25/2018 1337 Last data filed at 03/25/2018 1300 Gross per 24 hour  Intake 1803.87 ml  Output 2035 ml  Net -231.13 ml      Physical Exam   CVP 13-14  General:  In bed. No resp difficulty HEENT: normal Neck: supple. JVP 11-12  . Carotids 2+ bilat; no bruits. No lymphadenopathy or thyromegaly appreciated. Cor: PMI nondisplaced. Regular rate & rhythm. No rubs, gallops or murmurs. Sternal incision approximated.  Lungs: clear Abdomen: soft, nontender, +++distended. No hepatosplenomegaly. No bruits or masses. Good bowel sounds. Extremities: no cyanosis, clubbing, rash, edema. RUE  Neuro: alert & orientedx3, cranial nerves grossly intact. moves  all 4 extremities w/o difficulty. Affect pleasant  Telemetry   SR -ST 90-100s   EKG    N/A   Labs   Basic Metabolic Panel: Recent Labs  Lab 03/22/18 0414  03/22/18 1829  03/23/18 0300 03/23/18 1731 03/23/18 1736 03/24/18 0430 03/24/18 1159 03/25/18 0416  NA 138   < >  --    < > 134*  --  131* 133* 135 132*  K 4.1   < >  --    < > 4.3  --  4.4 4.1 4.1 3.6  CL 104   < >  --    < > 104  --  99 102 100 98  CO2 25  --   --   --  22  --   --  24 23 25   GLUCOSE 107*   < >  --    < > 114*  --  139* 127* 181* 144*  BUN 16   < >  --    < > 16  --  19 24* 27* 31*  CREATININE 1.12   < > 1.12   < > 0.95 1.17 1.10 1.35* 1.42* 1.27*  CALCIUM 9.4  --   --   --  8.6*  --   --  8.9 9.0 9.0  MG  --   --  3.3*  --  2.5* 2.1  --   --  2.3  --    < > = values in this interval not displayed.    Liver Function Tests: Recent Labs  Lab 03/25/18 0416  AST 25  ALT 32  ALKPHOS 56  BILITOT 0.7  PROT 5.3*  ALBUMIN 2.5*   No results for input(s): LIPASE, AMYLASE in the last 168 hours. No results for input(s): AMMONIA in the last 168 hours.  CBC: Recent Labs  Lab 03/22/18 1829  03/23/18 0300 03/23/18 1731 03/23/18 1736 03/24/18 0430 03/25/18 0416  WBC 16.3*  --  13.6* 18.2*  --  13.2* 10.0  HGB 10.4*   < > 10.4* 10.5* 9.9* 9.7* 9.0*  HCT 30.9*   < > 30.4* 31.2* 29.0* 28.9* 27.4*  MCV 86.8  --  85.6 86.4  --  88.7 88.4  PLT 210  --  216 214  --  181 205   < > = values in this interval not displayed.    Cardiac Enzymes: No results for input(s): CKTOTAL, CKMB, CKMBINDEX, TROPONINI in the last 168 hours.  BNP: BNP (last 3 results) Recent Labs    03/14/18 0936  BNP 180.0*    ProBNP (last 3 results) No results for input(s): PROBNP in the last 8760 hours.   CBG: Recent Labs  Lab 03/24/18 2021 03/24/18 2332 03/25/18 0421 03/25/18 0809 03/25/18 1141  GLUCAP 128* 115* 141* 129* 130*    Coagulation Studies: No results for input(s): LABPROT, INR in the last 72  hours.   Imaging   Dg Chest Port 1 View  Result Date: 03/25/2018 CLINICAL DATA:  Follow-up chest tube placement EXAM: PORTABLE CHEST 1 VIEW COMPARISON:  03/24/2018 FINDINGS: Cardiac shadow is enlarged but stable. Postsurgical changes are again seen. Left thoracostomy catheter is again noted. No pneumothorax is seen. Right jugular sheath is noted but kinked at the skin surface stable from the prior exam. Bibasilar atelectatic changes are again seen. IMPRESSION: Overall stable appearance of the chest with bibasilar atelectasis. Electronically Signed   By: Inez Catalina M.D.   On: 03/25/2018 07:41   [place] Ekg Site Rite  Result Date: 03/25/2018 If [employer] not  attached, placement could not be confirmed due to current cardiac rhythm.     Medications:     Current Medications: . acetaminophen  1,000 mg Oral Q6H   Or  . acetaminophen (TYLENOL) oral liquid 160 mg/5 mL  1,000 mg Per Tube Q6H  . amiodarone  400 mg Oral BID  . aspirin EC  325 mg Oral Daily   Or  . aspirin  324 mg Per Tube Daily  . atorvastatin  80 mg Oral q1800  . bisacodyl  10 mg Oral Daily   Or  . bisacodyl  10 mg Rectal Daily  . carvedilol  3.125 mg Oral BID WC  . Chlorhexidine Gluconate Cloth  6 each Topical Daily  . docusate sodium  200 mg Oral Daily  . enoxaparin (LOVENOX) injection  40 mg Subcutaneous QHS  . furosemide  80 mg Intravenous Daily  . insulin aspart  0-24 Units Subcutaneous TID WC & HS  . insulin detemir  10 Units Subcutaneous Daily  . metoCLOPramide (REGLAN) injection  10 mg Intravenous Q6H  . mupirocin cream   Topical BID  . pantoprazole  40 mg Oral Daily  . sodium chloride flush  10-40 mL Intracatheter Q12H  . sodium chloride flush  10-40 mL Intracatheter Q12H  . sodium chloride flush  3 mL Intravenous Q12H  . traZODone  50 mg Oral QHS     Infusions: . sodium chloride 10 mL/hr at 03/25/18 1300  . 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   Assessment/Plan   1.S/P CABG x3  9/24 CAD/NSTEMI  ECHO 9/24  LVEF normal. Limted ECHO on 9/26 showed LVEF 55-60% with normal RV.  Milrinone turned up to 0.25 mcg. CO-OX 47% this morning. Repeat CO-OX 42% on milrinone 0.25 mcg.  Low CO-OX could be from anemia. Would continue current dose until euvolemic. He is 10-15 pounds above his baseline.  Volume overloaded. CVP 12-13. Increase to 80 mg twice a day. Supp K  - Add 12.5 mg spiro daily.  - Stop carvedilol for now.  On asa and statin.    2. PAF On admit. Amio drip started post op and later transitioned to amio 400 mg twice a day.  Has not started anticoagulation.  Would recommend starting eliquis 5 mg twice a day when ok with CT surgery.   Hgb 9.   3. Anemia  Expected blood loss.   Hgb 9.0  Follow CBCs.   4. Torsades [place]  9/26. Started lidocaine drip. Lidocaine stopped earlier today.  K replaced. Mag 2.3   5. CKD Stage III Creatinine 1.27    Medication concerns reviewed with patient and pharmacy team. Barriers identified: n/a   Length of Stay: [place], NP  03/25/2018, 1:37 PM  Advanced Heart Failure Team Pager [phone] (M-F; [place])  Please contact [facility] for night-coverage after hours (4p -7a ) and weekends on [email or web address]  Patient seen and examined with the above-signed Advanced Practice Provider and/or Housestaff. I personally reviewed laboratory data, imaging studies and relevant notes. I independently examined the patient and formulated the important aspects of the plan. I have edited the note to reflect any of my changes or salient points. I have personally discussed the plan with the patient and/or family.  72 y/o male with  h/o CKD 3, PAF and CAD recently admitted with NSTEM. Had unsuccessful PCI and then underwent CABG. Post-op course c/b Torsades and persistent volume  overload. Remains on milrinone but co-ox has been dropping over last 2 days. Last co-ox 42% this am. Milrinone increased to 0.25.   Says he feels OK and was able to walk unit today but sats dropped. Now on lasix 80 IV bid. Weight up 16 pounds from pre-op weight  Echo reviewed personally and EF looks to be about 45-50% to me (read as 55-60%) with mild RV dysfunction.   On exam CVP 13 Cor RRR no s 3 Lungs decreased at bases Ab distended.   Ext warm 3+ edema  Will repeat co-ox. Continue milrinone for now. Agree with stopping b-blocker. He remains markedly volume overloaded. Will add metolazone. Can switch to lasix gtt if needed. We will follow closely.   [name], MD  5:50 PM

## 2018-03-25 NOTE — Progress Notes (Signed)
      [address]       [place] [address]             [phone]      Resting comfortably  BP 111/68   Pulse 99   Temp 98 F (36.7 C) (Oral)   Resp (!) 25   Ht 6' (1.829 m)   Wt 101.3 kg   SpO2 96%   BMI 30.29 kg/m   Intake/Output Summary (Last 24 hours) at 03/25/2018 1638 Last data filed at 03/25/2018 1600 Gross per 24 hour  Intake 1666.37 ml  Output 2211 ml  Net -544.63 ml   Milrinone increased to 0.25 this AM Lasix increased as well Continue present care  Remo Lipps C. Roxan Hockey, MD [facility] [phone]

## 2018-03-25 NOTE — Plan of Care (Signed)
  Problem: Health Behavior/Discharge Planning: Goal: Ability to manage health-related needs will improve Outcome: Progressing   Problem: Clinical Measurements: Goal: Ability to maintain clinical measurements within normal limits will improve Outcome: Progressing Goal: Will remain free from infection Outcome: Progressing Goal: Diagnostic test results will improve Outcome: Progressing Goal: Respiratory complications will improve Outcome: Progressing Goal: Cardiovascular complication will be avoided Outcome: Progressing   Problem: Activity: Goal: Risk for activity intolerance will decrease Outcome: Progressing   Problem: Nutrition: Goal: Adequate nutrition will be maintained Outcome: Progressing   Problem: Coping: Goal: Level of anxiety will decrease Outcome: Progressing   Problem: Elimination: Goal: Will not experience complications related to bowel motility Outcome: Progressing Goal: Will not experience complications related to urinary retention Outcome: Progressing   Problem: Pain Managment: Goal: General experience of comfort will improve Outcome: Progressing   Problem: Safety: Goal: Ability to remain free from injury will improve Outcome: Progressing   Problem: Skin Integrity: Goal: Risk for impaired skin integrity will decrease Outcome: Progressing   Problem: Education: Goal: Knowledge of disease or condition will improve Outcome: Progressing Goal: Understanding of medication regimen will improve Outcome: Progressing Goal: Individualized Educational Video(s) Outcome: Progressing   Problem: Activity: Goal: Ability to tolerate increased activity will improve Outcome: Progressing   Problem: Cardiac: Goal: Ability to achieve and maintain adequate cardiopulmonary perfusion will improve Outcome: Progressing   Problem: Health Behavior/Discharge Planning: Goal: Ability to safely manage health-related needs after discharge will improve Outcome: Progressing    Problem: Education: Goal: Understanding of cardiac disease, CV risk reduction, and recovery process will improve Outcome: Progressing Goal: Understanding of medication regimen will improve Outcome: Progressing Goal: Individualized Educational Video(s) Outcome: Progressing   Problem: Activity: Goal: Ability to tolerate increased activity will improve Outcome: Progressing   Problem: Cardiac: Goal: Ability to achieve and maintain adequate cardiopulmonary perfusion will improve Outcome: Progressing Goal: Vascular access site(s) Level 0-1 will be maintained Outcome: Progressing   Problem: Health Behavior/Discharge Planning: Goal: Ability to safely manage health-related needs after discharge will improve Outcome: Progressing   Problem: Education: Goal: Will demonstrate proper wound care and an understanding of methods to prevent future damage Outcome: Progressing Goal: Knowledge of disease or condition will improve Outcome: Progressing Goal: Knowledge of the prescribed therapeutic regimen will improve Outcome: Progressing Goal: Individualized Educational Video(s) Outcome: Progressing   Problem: Activity: Goal: Risk for activity intolerance will decrease Outcome: Progressing   Problem: Cardiac: Goal: Will achieve and/or maintain hemodynamic stability Outcome: Progressing   Problem: Clinical Measurements: Goal: Postoperative complications will be avoided or minimized Outcome: Progressing   Problem: Respiratory: Goal: Respiratory status will improve Outcome: Progressing   Problem: Skin Integrity: Goal: Wound healing without signs and symptoms of infection Outcome: Progressing Goal: Risk for impaired skin integrity will decrease Outcome: Progressing   Problem: Urinary Elimination: Goal: Ability to achieve and maintain adequate renal perfusion and functioning will improve Outcome: Progressing

## 2018-03-25 NOTE — Progress Notes (Signed)
Progress Note  Patient Name: David Mccullough Date of Encounter: 03/25/2018  Primary Cardiologist: No primary care provider on file.   Subjective   No chest pain or shortness of breath.  Flat affect noted.  Feels tired.  Inpatient Medications    Scheduled Meds: . acetaminophen  1,000 mg Oral Q6H   Or  . acetaminophen (TYLENOL) oral liquid 160 mg/5 mL  1,000 mg Per Tube Q6H  . amiodarone  400 mg Oral BID  . aspirin EC  325 mg Oral Daily   Or  . aspirin  324 mg Per Tube Daily  . atorvastatin  80 mg Oral q1800  . bisacodyl  10 mg Oral Daily   Or  . bisacodyl  10 mg Rectal Daily  . carvedilol  3.125 mg Oral BID WC  . Chlorhexidine Gluconate Cloth  6 each Topical Daily  . docusate sodium  200 mg Oral Daily  . enoxaparin (LOVENOX) injection  40 mg Subcutaneous QHS  . furosemide  80 mg Intravenous Daily  . insulin aspart  0-24 Units Subcutaneous TID WC & HS  . insulin detemir  10 Units Subcutaneous Daily  . metoCLOPramide (REGLAN) injection  10 mg Intravenous Q6H  . mupirocin cream   Topical BID  . pantoprazole  40 mg Oral Daily  . sodium chloride flush  10-40 mL Intracatheter Q12H  . sodium chloride flush  3 mL Intravenous Q12H  . sorbitol  60 mL Oral Once  . traZODone  50 mg Oral QHS   Continuous Infusions: . sodium chloride 500 mL (03/25/18 0818)  . milrinone 0.25 mcg/kg/min (03/25/18 0800)  . potassium chloride 10 mEq (03/25/18 0819)   PRN Meds: sodium chloride, ondansetron (ZOFRAN) IV, oxyCODONE, sodium chloride flush, sodium chloride flush, traMADol   Vital Signs    Vitals:   03/25/18 0600 03/25/18 0700 03/25/18 0800 03/25/18 0811  BP: (!) 138/58 104/67 122/87   Pulse: 99 (!) 103 98   Resp: (!) 31 19 (!) 27   Temp:    98.3 F (36.8 C)  TempSrc:    Oral  SpO2: 95% 90% 93%   Weight:      Height:        Intake/Output Summary (Last 24 hours) at 03/25/2018 0848 Last data filed at 03/25/2018 0830 Gross per 24 hour  Intake 2121.53 ml  Output 2145 ml  Net  -23.47 ml   Filed Weights   03/23/18 0500 03/24/18 0500 03/25/18 0500  Weight: 99.9 kg 102.1 kg 101.3 kg    Telemetry    Personally reviewed.  Currently in normal sinus rhythm.  Several short runs of torsade noted yesterday.- Personally Reviewed  ECG    Normal sinus rhythm low voltage QRS, age-indeterminate inferior infarct, QTC 464 ms- Personally Reviewed  Physical Exam  Alert, oriented male GEN: No acute distress.   Neck: No JVD Cardiac: RRR, no murmurs, rubs, or gallops.  Respiratory:  Diminished in the bases, chest tubes in place GI: Soft, nontender, non-distended  MS:  2+ bilateral leg edema; No deformity. Neuro:  Nonfocal  Psych: Normal affect   Labs    Chemistry Recent Labs  Lab 03/24/18 0430 03/24/18 1159 03/25/18 0416  NA 133* 135 132*  K 4.1 4.1 3.6  CL 102 100 98  CO2 24 23 25   GLUCOSE 127* 181* 144*  BUN 24* 27* 31*  CREATININE 1.35* 1.42* 1.27*  CALCIUM 8.9 9.0 9.0  PROT  --   --  5.3*  ALBUMIN  --   --  2.5*  AST  --   --  25  ALT  --   --  32  ALKPHOS  --   --  56  BILITOT  --   --  0.7  GFRNONAA 50* 47* 54*  GFRAA 59* 55* >60  ANIONGAP 7 12 9      Hematology Recent Labs  Lab 03/23/18 1731 03/23/18 1736 03/24/18 0430 03/25/18 0416  WBC 18.2*  --  13.2* 10.0  RBC 3.61*  --  3.26* 3.10*  HGB 10.5* 9.9* 9.7* 9.0*  HCT 31.2* 29.0* 28.9* 27.4*  MCV 86.4  --  88.7 88.4  MCH 29.1  --  29.8 29.0  MCHC 33.7  --  33.6 32.8  RDW 12.6  --  12.6 12.7  PLT 214  --  181 205    Cardiac EnzymesNo results for input(s): TROPONINI in the last 168 hours. No results for input(s): TROPIPOC in the last 168 hours.   BNPNo results for input(s): BNP, PROBNP in the last 168 hours.   DDimer No results for input(s): DDIMER in the last 168 hours.   Radiology    Dg Chest Port 1 View  Result Date: 03/25/2018 CLINICAL DATA:  Follow-up chest tube placement EXAM: PORTABLE CHEST 1 VIEW COMPARISON:  03/24/2018 FINDINGS: Cardiac shadow is enlarged but stable.  Postsurgical changes are again seen. Left thoracostomy catheter is again noted. No pneumothorax is seen. Right jugular sheath is noted but kinked at the skin surface stable from the prior exam. Bibasilar atelectatic changes are again seen. IMPRESSION: Overall stable appearance of the chest with bibasilar atelectasis. Electronically Signed   By: Inez Catalina M.D.   On: 03/25/2018 07:41   Dg Chest Port 1 View  Result Date: 03/24/2018 CLINICAL DATA:  72 year old male with a history of coronary bypass graft EXAM: PORTABLE CHEST 1 VIEW COMPARISON:  03/23/2018 FINDINGS: Cardiomediastinal silhouette unchanged in size and contour, with similar with of the upper mediastinum. Surgical changes of median sternotomy and CABG. Surgical changes of atrial amputation/clipping. Right IJ sheath remains in position with interval withdrawal of the Swan-Ganz catheter. There is been kinking of the sheath at the insertion site. Unchanged position of mediastinal drains/left pleural drain/chest tubes. No visualized pneumothorax. Epicardial pacing leads in place. Linear opacities at the bilateral lung bases. IMPRESSION: Early changes of CABG, with no pneumothorax, and basilar atelectasis. Interval withdrawal of the Swan-Ganz catheter, with unchanged position of right IJ sheath. Note that there is kinking at the site of insertion. Unchanged position of mediastinal/pleural drains, and epicardial pacing leads. Electronically Signed   By: Corrie Mckusick D.O.   On: 03/24/2018 09:34   [place] Ekg Site Rite  Result Date: 03/25/2018 If Site Rite image not attached, placement could not be confirmed due to current cardiac rhythm.  [place] Ekg Site Rite  Result Date: 03/24/2018 If Site Rite image not attached, placement could not be confirmed due to current cardiac rhythm.   Cardiac Studies   2D Echo: Study Conclusions  - Left ventricle: The cavity size was normal. There was mild   concentric hypertrophy. Systolic function was normal. The    estimated ejection fraction was in the range of 55% to 60%. Wall   motion was normal; there were no regional wall motion   abnormalities. Left ventricular diastolic function parameters   were normal. - Mitral valve: There was mild regurgitation. Valve area by   pressure half-time: 1.98 cm^2. - Left atrium: The atrium was moderately dilated. - Right ventricle: The cavity size was normal.  Wall thickness was   normal. Systolic function was normal. - Pulmonary arteries: Systolic pressure was mildly increased. PA   peak pressure: 37 mm Hg (S). - Pericardium, extracardiac: A mild pericardial effusion was   identified lateral to the left ventricle. Features were not   consistent with tamponade physiology.  Impressions:  - Limited study, however, there is no significant change since the   last study on 03/22/2018, LVEF reamins normal at 55-60% with no   regional wall motion abnormalities.  Patient Profile     72 y.o. male with newly diagnosed atrial fibrillation and non-STEMI with multivessel coronary artery disease, unsuccessful PCI, transferred for CABG.  Assessment & Plan    1.  Non-STEMI: Treated with multivessel CABG, now postoperative day #2.  Appears stable, treated with aspirin, carvedilol, and a high intensity statin drug.  2.  Paroxysmal atrial fib: Maintaining sinus rhythm on amiodarone 400 mg twice daily.  Consider oral anticoagulation once he is stable postoperatively with all tubes and wires out.  3.  Torsade de pointes: Reviewed telemetry, rhythm now very stable after treatment with lidocaine use and IV magnesium.  Potassium is being repleted as well.  Now off of lidocaine.  Unclear etiology as patient's QT is normal, echo is reviewed and shows normal LV function, and he is otherwise stable.  We will continue to monitor on telemetry.      For questions or updates, please contact [place] Please consult [email or web address] for contact info under        Signed, Sherren Mocha, MD  03/25/2018, 8:48 AM

## 2018-03-25 NOTE — Discharge Instructions (Signed)
Endoscopic Saphenous Vein Harvesting, Care After °Refer to this sheet in the next few weeks. These instructions provide you with information about caring for yourself after your procedure. Your health care provider may also give you more specific instructions. Your treatment has been planned according to current medical practices, but problems sometimes occur. Call your health care provider if you have any problems or questions after your procedure. °What can I expect after the procedure? °After the procedure, it is common to have: °· Pain. °· Bruising. °· Swelling. °· Numbness. ° °Follow these instructions at home: °Medicine °· Take over-the-counter and prescription medicines only as told by your health care provider. °· Do not drive or operate heavy machinery while taking prescription pain medicine. °Incision care ° °· Follow instructions from your health care provider about how to take care of the cut made during surgery (incision). Make sure you: °? Wash your hands with soap and water before you change your bandage (dressing). If soap and water are not available, use hand sanitizer. °? Change your dressing as told by your health care provider. °? Leave stitches (sutures), skin glue, or adhesive strips in place. These skin closures may need to be in place for 2 weeks or longer. If adhesive strip edges start to loosen and curl up, you may trim the loose edges. Do not remove adhesive strips completely unless your health care provider tells you to do that. °· Check your incision area every day for signs of infection. Check for: °? More redness, swelling, or pain. °? More fluid or blood. °? Warmth. °? Pus or a bad smell. °General instructions °· Raise (elevate) your legs above the level of your heart while you are sitting or lying down. °· Do any exercises your health care providers have given you. These may include deep breathing, coughing, and walking exercises. °· Do not shower, take baths, swim, or use a hot tub  unless told by your health care provider. °· Wear your elastic stocking if told by your health care provider. °· Keep all follow-up visits as told by your health care provider. This is important. °Contact a health care provider if: °· Medicine does not help your pain. °· Your pain gets worse. °· You have new leg bruises or your leg bruises get bigger. °· You have a fever. °· Your leg feels numb. °· You have more redness, swelling, or pain around your incision. °· You have more fluid or blood coming from your incision. °· Your incision feels warm to the touch. °· You have pus or a bad smell coming from your incision. °Get help right away if: °· Your pain is severe. °· You develop pain, tenderness, warmth, redness, or swelling in any part of your leg. °· You have chest pain. °· You have trouble breathing. °This information is not intended to replace advice given to you by your health care provider. Make sure you discuss any questions you have with your health care provider. °Document Released: 02/25/2011 Document Revised: 11/21/2015 Document Reviewed: 04/29/2015 °Elsevier Interactive Patient Education © 2018 [employer] °Coronary Artery Bypass Grafting, Care After °These instructions give you information on caring for yourself after your procedure. Your doctor may also give you more specific instructions. Call your doctor if you have any problems or questions after your procedure. °Follow these instructions at home: °· Only take medicine as told by your doctor. Take medicines exactly as told. Do not stop taking medicines or start any new medicines without talking to your doctor first. °·   Take your pulse as told by your doctor. °· Do deep breathing as told by your doctor. Use your breathing device (incentive spirometer), if given, to practice deep breathing several times a day. Support your chest with a pillow or your arms when you take deep breaths or cough. °· Keep the area clean, dry, and protected where the  surgery cuts (incisions) were made. Remove bandages (dressings) only as told by your doctor. If strips were applied to surgical area, do not take them off. They fall off on their own. °· Check the surgery area daily for puffiness (swelling), redness, or leaking fluid. °· If surgery cuts were made in your legs: °? Avoid crossing your legs. °? Avoid sitting for long periods of time. Change positions every 30 minutes. °? Raise your legs when you are sitting. Place them on pillows. °· Wear stockings that help keep blood clots from forming in your legs (compression stockings). °· Only take sponge baths until your doctor says it is okay to take showers. Pat the surgery area dry. Do not rub the surgery area with a washcloth or towel. Do not bathe, swim, or use a hot tub until your doctor says it is okay. °· Eat foods that are high in fiber. These include raw fruits and vegetables, whole grains, beans, and nuts. Choose lean meats. Avoid canned, processed, and fried foods. °· Drink enough fluids to keep your pee (urine) clear or pale yellow. °· Weigh yourself every day. °· Rest and limit activity as told by your doctor. You may be told to: °? Stop any activity if you have chest pain, shortness of breath, changes in heartbeat, or dizziness. Get help right away if this happens. °? Move around often for short amounts of time or take short walks as told by your doctor. Gradually become more active. You may need help to strengthen your muscles and build endurance. °? Avoid lifting, pushing, or pulling anything heavier than 10 pounds (4.5 kg) for at least 6 weeks after surgery. °· Do not drive until your doctor says it is okay. °· Ask your doctor when you can go back to work. °· Ask your doctor when you can begin sexual activity again. °· Follow up with your doctor as told. °Contact a doctor if: °· You have puffiness, redness, more pain, or fluid draining from the incision site. °· You have a fever. °· You have puffiness in your  ankles or legs. °· You have pain in your legs. °· You gain 2 or more pounds (0.9 kg) a day. °· You feel sick to your stomach (nauseous) or throw up (vomit). °· You have watery poop (diarrhea). °Get help right away if: °· You have chest pain that goes to your jaw or arms. °· You have shortness of breath. °· You have a fast or irregular heartbeat. °· You notice a "clicking" in your breastbone when you move. °· You have numbness or weakness in your arms or legs. °· You feel dizzy or light-headed. °This information is not intended to replace advice given to you by your health care provider. Make sure you discuss any questions you have with your health care provider. °Document Released: 06/20/2013 Document Revised: 11/21/2015 Document Reviewed: 11/22/2012 °Elsevier Interactive Patient Education © 2017 [employer] ° °

## 2018-03-25 NOTE — Discharge Summary (Addendum)
Physician Discharge Summary  Patient ID: David Mccullough MRN: [id number] DOB/AGE: December 18, 1945 72 y.o.  Admit date: 03/16/2018 Discharge date: 03/30/2018  Admission Diagnoses: Coronary artery disease  Discharge Diagnoses:  Active Problems:   NSTEMI (non-ST elevated myocardial infarction) ([place])   S/P CABG x 3   Patient Active Problem List   Diagnosis Date Noted  . 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 NSTEMI (non-ST elevated myocardial infarction) ([place]) 03/14/2018   History of the present illness:  The patient is a 72 year old male with a history of SVT who was admitted to [facility] 03/14/2018 with complaints of chest pain.  He ruled in for non-STEMI with peak troponin 5.92.  He was also found to be in atrial fibrillation on arrival to [facility].  Cardiac cath done on 03/15/2018 per Dr. Clayborn Bigness and was found to have severe three-vessel CAD.  Options were discussed with the patient of CABG versus multivessel PCI.  He was loaded with Plavix and brought back to the catheterization lab at [facility] for a PCI.  Attempted PCI of the obtuse marginal was unsuccessful and the patient was therefore transferred to [facility] for further management to include cardiothoracic surgical consultation for possible CABG.  He was seen and evaluated by Dr. Darcey Nora and was felt to be a candidate to proceed with CABG.  Discharged Condition: good  Hospital Course: The patient was transferred to [facility] for further management and treatment.  He was medically stabilized.  On 03/22/2018 he was felt to be stable to proceed and was taken to the operating room where he underwent the below described procedure.  He tolerated it well and was taken to the surgical intensive care unit in stable condition.  Postoperative hospital course:  Patient was weaned from the ventilator without difficulty using standard protocols.  He did have some difficulty overnight following surgery was decreased cardiac index requiring the addition of multiple  pressors.  Echocardiogram was done to assist with management.  This did stabilize over time and the pressors have slowly being weaned.  He is also on amiodarone for history of persistent atrial fibrillation but is maintaining sinus rhythm for the most part.  He did have some episodes of torsades.  This was managed with magnesium and lidocaine and has resolved.  He does have some postoperative volume overload and is responding to diuretics.  The advanced heart failure team is assisted with management and medication adjustments.  He is felt to have responded well to current diuretic regimen and milrinone is completely off with COOX most recently 8.  Incisions are noted to be healing well without evidence of infection.  He is tolerating no significant difficulties routine for post cardiac rehab.  Oxygen has been weaned off and he maintains good saturations on room air.  Blood sugars are under good control.  He does not have a diagnosis of diabetes.  At the time of discharge the patient is felt to be quite stable.  Consults: cardiology  Significant Diagnostic Studies: angiography: Cardiac catheterization.  Routine postoperative labs and serial chest x-rays.  Treatments: surgery:  DATE OF PROCEDURE:  03/22/2018  OPERATION: 1.  Coronary artery bypass grafting x3 (left internal mammary artery to left anterior descending, saphenous vein graft to ramus intermedius, saphenous vein graft to circumflex marginal). 2.  Endoscopic harvest of right leg greater saphenous vein.  SURGEON:  Ivin Poot, MD  ASSISTANT:  Jadene Pierini PA-C.  PREOPERATIVE DIAGNOSES:  Severe multivessel coronary artery disease, recent non-ST segment elevated myocardial infarction after attempted  percutaneous coronary intervention of circumflex marginal stenosis for symptoms of progressive angina.  POSTOPERATIVE DIAGNOSES:  Severe multivessel coronary artery disease, recent non-ST segment elevated myocardial infarction after  attempted percutaneous coronary intervention of circumflex marginal stenosis for symptoms of progressive angina.  ANESTHESIA:  General by Dr. Renold Don. Discharge Exam: Blood pressure 112/77, pulse 88, temperature 97.8 F (36.6 C), temperature source Oral, resp. rate 19, height 6' (1.829 m), weight 91.2 kg, SpO2 96 %.   General appearance: alert, cooperative and no distress Heart: regular rate and rhythm Lungs: coarse in upper airways, improves with cough Abdomen: benign Extremities: no edema Wound: incis healing well   Disposition: Discharge disposition: 01-Home or Self Care       Discharge Instructions    Amb Referral to Cardiac Rehabilitation   Complete by:  As directed    Diagnosis:  CABG   CABG X ___:  3   Discharge patient   Complete by:  As directed    Discharge disposition:  01-Home or Self Care   Discharge patient date:  03/30/2018     Allergies as of 03/30/2018   No Known Allergies     Medication List    STOP taking these medications   heparin 100-0.45 UNIT/ML-% infusion   metoprolol tartrate 25 MG tablet Commonly known as:  LOPRESSOR   nitroGLYCERIN 0.4 MG SL tablet Commonly known as:  NITROSTAT   traZODone 50 MG tablet Commonly known as:  DESYREL     TAKE these medications   amiodarone 200 MG tablet Commonly known as:  PACERONE Take 1 tablet (200 mg total) by mouth 2 (two) times daily.   aspirin 81 MG chewable tablet Chew 81 mg by mouth at bedtime.   atorvastatin 80 MG tablet Commonly known as:  LIPITOR Take 1 tablet (80 mg total) by mouth daily at 6 PM. What changed:    medication strength  how much to take   CALCIUM 1000 + D PO Take 1 tablet by mouth daily.   cephALEXin 500 MG capsule Commonly known as:  KEFLEX Take 1 capsule (500 mg total) by mouth every 12 (twelve) hours.   clopidogrel 75 MG tablet Commonly known as:  PLAVIX Take 1 tablet (75 mg total) by mouth daily with breakfast. What changed:  Another medication with  the same name was removed. Continue taking this medication, and follow the directions you see here.   Fish Oil 1200 MG Caps Take 1 capsule by mouth daily.   furosemide 40 MG tablet Commonly known as:  LASIX Take 1 tablet (40 mg total) by mouth daily as needed. only on days that you gain 3 pounds. Weigh yourself each morning   multivitamin tablet Take 1 tablet by mouth daily.   potassium chloride SA 20 MEQ tablet Commonly known as:  K-DUR,KLOR-CON Take 1 tablet (20 mEq total) by mouth daily as needed. Only on days that you take a dose of lasix   spironolactone 25 MG tablet Commonly known as:  ALDACTONE Take 0.5 tablets (12.5 mg total) by mouth daily.   traMADol 50 MG tablet Commonly known as:  ULTRAM Take 1-2 tablets (50-100 mg total) by mouth every 6 (six) hours as needed for up to 7 days for moderate pain.      Follow-up Information    Yolonda Kida, MD Follow up.   Specialties:  Cardiology, Internal Medicine Why:  Appointment to see cardiology on 04/07/2018 at 10:15 AM. Contact information: [place] [address] [phone]  Ivin Poot, MD Follow up.   Specialty:  Cardiothoracic Surgery Why:  Appointment to see the surgeon on 04/27/2018 at 2 PM.  Please obtain a chest x-ray at [facility] at 1:30 PM.  [facility] imaging is located in the same office complex on the first floor. Contact information: [address] [place] [address] [phone]        [facility]. Go on 04/13/2018.   Specialty:  Cardiology Why:  9:00 AM, Advanced Heart Failure Clinic, parking code 1700 Contact information: [address] [id number] [place] [phone]         The patient has been discharged on:   1.Beta Blocker:  Yes [ n ]                              No   [   ]                              If No, reason:meds per AHF team, on  amiodarone for torsades  2.Ace Inhibitor/ARB: Yes [   ]                                     No  [   n ]                                     If No, reason:elev creat  3.Statin:   Yes y[   ]                  No  [   ]                  If No, reason:  4.Ecasa:  Yes  [ y  ]                  No   [   ]                  If No, reason:  Signed: John Giovanni 03/30/2018, 11:20 AM   patient examined and medical record reviewed,agree with above note. [name] 04/05/2018

## 2018-03-25 NOTE — Care Management (Signed)
03-25-18 BENEFITS CHECK :  #  7.  S/W  [name]  @ [facility] RX # [id number]   APIXABAN :  NONE FORMULARY  1. ELIQUIS   2.5 MG BID COVER- YES CO-PAY- $ 28.00 TIER- NO PRIOR APPROVAL- NO  2. ELIQUIS  5 MG BID COVER- YES CO-PAY- $ 28.00 TIER- NO PRIOR APPROVAL- NO  PREFERRED PHARMACY : YES [facility] , [facility] AND [facility]

## 2018-03-25 NOTE — Progress Notes (Signed)
Peripherally Inserted Central Catheter/Midline Placement  The IV Nurse has discussed with the patient and/or persons authorized to consent for the patient, the purpose of this procedure and the potential benefits and risks involved with this procedure.  The benefits include less needle sticks, lab draws from the catheter, and the patient may be discharged home with the catheter. Risks include, but not limited to, infection, bleeding, blood clot (thrombus formation), and puncture of an artery; nerve damage and irregular heartbeat and possibility to perform a PICC exchange if needed/ordered by physician.  Alternatives to this procedure were also discussed.  Bard Power PICC patient education guide, fact sheet on infection prevention and patient information card has been provided to patient /or left at bedside.    PICC/Midline Placement Documentation        Darlyn Read 03/25/2018, 1:14 PM

## 2018-03-25 NOTE — Evaluation (Signed)
Physical Therapy Evaluation Patient Details Name: David Mccullough MRN: [id number] DOB: Aug 08, 1945 Today's Date: 03/25/2018   History of Present Illness  72 yo male with SVT s/p CABG x3 9/24. Pt admitted to [facility] 9/16 with NSTEMI, [facility] Pt with cardiac cath 9/17. PMHx A-fib, CAD, tachycardia.    Clinical Impression  Pt pleasant on arrival, reporting had already walked this morning with nursing. Reviewed all sternal precautions with handout provided. Pt able to demonstrated transfers and ambulation of 200 ft with RW and min guard for steady and line safety. Pt with SOB throughout session and one standing rest break (see O2 stat below). Pt with decreased strength, balance, and activity tolerance (see PT problem list for all). Pt will benefit acutely from skilled therapy in order to maximize functional mobility, independence, and safety.       Pt Vitals prior to session in seated recliner BP: 104/67 (79), HR 98, SpO2 92% on 4L.  Pt HR throughout session 98-122. During ambulation on 4L SpO2 down to 88, bumped up to 6L with SpO2 ranging 88-91 throughout. Pt vitals post session: BP 111/48 (68), HR 101, SpO2 94% on 4L.      Follow Up Recommendations Home health PT    Equipment Recommendations  Rolling walker with 5" wheels;3in1 (PT)    Recommendations for Other Services       Precautions / Restrictions Precautions Precautions: Sternal;Fall Precaution Booklet Issued: Yes (comment) Precaution Comments: precautions reviewed with pt able to recall "no pushing, no lifting" at end of session.  Restrictions Weight Bearing Restrictions: Yes(sternal )      Mobility  Bed Mobility               General bed mobility comments: in chair on arrival   Transfers Overall transfer level: Needs assistance Equipment used: Rolling walker (2 wheeled) Transfers: Sit to/from Stand Sit to Stand: Min guard         General transfer comment: min guard for safety and lines. cues for  breathing and no push/pull on arm rests, pt with valsalva during transfers   Ambulation/Gait Ambulation/Gait assistance: Min guard Gait Distance (Feet): 200 Feet Assistive device: Rolling walker (2 wheeled) Gait Pattern/deviations: Decreased stride length;Trunk flexed Gait velocity: decreased  Gait velocity interpretation: 1.31 - 2.62 ft/sec, indicative of limited community ambulator General Gait Details: overall steady with use of RW, verbal cues for upright posture. one standing rest break. ambulation at 4L SpO2 88% bumped to 6L [place] with SpO2 88-92%.   Stairs            Wheelchair Mobility    Modified Rankin (Stroke Patients Only)       Balance Overall balance assessment: Needs assistance   Sitting balance-Leahy Scale: Fair Sitting balance - Comments: pt with need for assist to scoot hips to edge of chair.      Standing balance-Leahy Scale: Fair                               Pertinent Vitals/Pain Pain Assessment: No/denies pain    Home Living Family/patient expects to be discharged to:: Private residence Living Arrangements: Spouse/significant other Available Help at Discharge: Family Type of Home: Apartment Home Access: Ramped entrance     Home Layout: One level        Prior Function Level of Independence: Independent         Comments: independent with all ADLs, driving, enjoys walking  Hand Dominance        Extremity/Trunk Assessment   Upper Extremity Assessment Upper Extremity Assessment: Overall WFL for tasks assessed    Lower Extremity Assessment Lower Extremity Assessment: Generalized weakness    Cervical / Trunk Assessment Cervical / Trunk Assessment: Kyphotic  Communication   Communication: No difficulties  Cognition Arousal/Alertness: Awake/alert Behavior During Therapy: WFL for tasks assessed/performed Overall Cognitive Status: Within Functional Limits for tasks assessed                                  General Comments: increased time for processing.       General Comments General comments (skin integrity, edema, etc.): sternal bandaging looks clean and dry.     Exercises     Assessment/Plan    PT Assessment Patient needs continued PT services  PT Problem List Decreased strength;Decreased range of motion;Decreased knowledge of use of DME;Cardiopulmonary status limiting activity;Decreased mobility;Decreased balance;Decreased knowledge of precautions;Decreased activity tolerance;Decreased safety awareness       PT Treatment Interventions DME instruction;Balance training;Gait training;Functional mobility training;Patient/family education;Therapeutic activities;Therapeutic exercise    PT Goals (Current goals can be found in the Care Plan section)  Acute Rehab PT Goals Patient Stated Goal: return home, go hiking  PT Goal Formulation: With patient Time For Goal Achievement: 04/08/18 Potential to Achieve Goals: Fair    Frequency Min 3X/week   Barriers to discharge        Co-evaluation               AM-PAC PT "6 Clicks" Daily Activity  Outcome Measure Difficulty turning over in bed (including adjusting bedclothes, sheets and blankets)?: A Lot Difficulty moving from lying on back to sitting on the side of the bed? : A Lot Difficulty sitting down on and standing up from a chair with arms (e.g., wheelchair, bedside commode, etc,.)?: A Lot Help needed moving to and from a bed to chair (including a wheelchair)?: A Little Help needed walking in hospital room?: A Little Help needed climbing 3-5 steps with a railing? : A Lot 6 Click Score: 14    End of Session Equipment Utilized During Treatment: Gait belt;Oxygen Activity Tolerance: Patient tolerated treatment well;Patient limited by fatigue Patient left: in chair;with call bell/phone within reach;with nursing/sitter in room Nurse Communication: Mobility status PT Visit Diagnosis: Other abnormalities of gait and  mobility (R26.89);Unsteadiness on feet (R26.81)    Time: 1761-6073 PT Time Calculation (min) (ACUTE ONLY): 26 min   Charges:   PT Evaluation $PT Eval Moderate Complexity: 1 Mod PT Treatments $Gait Training: 8-22 mins        Samuella Bruin, [place]  Acute Rehab [phone]   Samuella Bruin 03/25/2018, 9:19 AM

## 2018-03-25 NOTE — Care Management Note (Addendum)
Case Management Note  Patient Details  Name: David Mccullough MRN: [id number] Date of Birth: [birth year]  Subjective/Objective: 72 year old presented with chest pain; s/p CABG.               Action/Plan: CM met with patient to discuss transitional needs. Patient lives at home with spouse, independent with ADLs, with no DME in use. Patient reported no PCP established since relocating to [facility], with spouse in process of scheduling an appointment. Patient agreeable to CM contacting spouse for PCP information, but spouse is unavailable today d/t a scheduled outpatient procedure. CM discussed PT recommendations for HHPT, with patient agreeable, but CM unable to schedule without patient having an established PCP. Patient indicated spouse could provide transportation home. CM will continue to follow for additional options as patient progress.  Expected Discharge Date:                  Expected Discharge Plan:  [place]  In-House Referral:  NA  Discharge planning Services  CM Consult  Post Acute Care Choice:  Home Health Choice offered to:  Patient  DME Arranged:  N/A DME Agency:  NA  HH Arranged:  PT [place] Agency:  [facility]  Status of Service:  In process, will continue to follow  If discussed at Long Length of Stay Meetings, dates discussed:    Additional Comments:  Midge Minium RN, BSN, NCM-BC, ACM-RN [phone] 03/25/2018, 2:55 PM

## 2018-03-26 ENCOUNTER — Inpatient Hospital Stay (HOSPITAL_COMMUNITY): Payer: Medicare Other

## 2018-03-26 LAB — CBC
HCT: 27.5 % — ABNORMAL LOW (ref 39.0–52.0)
Hemoglobin: 9 g/dL — ABNORMAL LOW (ref 13.0–17.0)
MCH: 28.6 pg (ref 26.0–34.0)
MCHC: 32.7 g/dL (ref 30.0–36.0)
MCV: 87.3 fL (ref 78.0–100.0)
Platelets: 231 10*3/uL (ref 150–400)
RBC: 3.15 MIL/uL — ABNORMAL LOW (ref 4.22–5.81)
RDW: 12.7 % (ref 11.5–15.5)
WBC: 9.8 10*3/uL (ref 4.0–10.5)

## 2018-03-26 LAB — COOXEMETRY PANEL
Carboxyhemoglobin: 1.6 % — ABNORMAL HIGH (ref 0.5–1.5)
Carboxyhemoglobin: 1.6 % — ABNORMAL HIGH (ref 0.5–1.5)
Methemoglobin: 1 % (ref 0.0–1.5)
Methemoglobin: 1.1 % (ref 0.0–1.5)
O2 Saturation: 58 %
O2 Saturation: 53.7 %
Total hemoglobin: 9.4 g/dL — ABNORMAL LOW (ref 12.0–16.0)
Total hemoglobin: 10.1 g/dL — ABNORMAL LOW (ref 12.0–16.0)

## 2018-03-26 LAB — COMPREHENSIVE METABOLIC PANEL
ALT: 28 U/L (ref 0–44)
AST: 22 U/L (ref 15–41)
Albumin: 2.4 g/dL — ABNORMAL LOW (ref 3.5–5.0)
Alkaline Phosphatase: 60 U/L (ref 38–126)
Anion gap: 9 (ref 5–15)
BUN: 34 mg/dL — ABNORMAL HIGH (ref 8–23)
CO2: 26 mmol/L (ref 22–32)
Calcium: 8.6 mg/dL — ABNORMAL LOW (ref 8.9–10.3)
Chloride: 98 mmol/L (ref 98–111)
Creatinine, Ser: 1.4 mg/dL — ABNORMAL HIGH (ref 0.61–1.24)
GFR calc Af Amer: 56 mL/min — ABNORMAL LOW (ref >60)
GFR calc non Af Amer: 48 mL/min — ABNORMAL LOW (ref >60)
Glucose, Bld: 129 mg/dL — ABNORMAL HIGH (ref 70–99)
Potassium: 3 mmol/L — ABNORMAL LOW (ref 3.5–5.1)
Sodium: 133 mmol/L — ABNORMAL LOW (ref 135–145)
Total Bilirubin: 0.7 mg/dL (ref 0.3–1.2)
Total Protein: 5 g/dL — ABNORMAL LOW (ref 6.5–8.1)

## 2018-03-26 LAB — POCT I-STAT 4, (NA,K, GLUC, HGB,HCT)
Glucose, Bld: 133 mg/dL — ABNORMAL HIGH (ref 70–99)
HCT: 26 % — ABNORMAL LOW (ref 39.0–52.0)
Hemoglobin: 8.8 g/dL — ABNORMAL LOW (ref 13.0–17.0)
Potassium: 3.4 mmol/L — ABNORMAL LOW (ref 3.5–5.1)
Sodium: 133 mmol/L — ABNORMAL LOW (ref 135–145)

## 2018-03-26 LAB — GLUCOSE, CAPILLARY
Glucose-Capillary: 114 mg/dL — ABNORMAL HIGH (ref 70–99)
Glucose-Capillary: 104 mg/dL — ABNORMAL HIGH (ref 70–99)
Glucose-Capillary: 140 mg/dL — ABNORMAL HIGH (ref 70–99)
Glucose-Capillary: 104 mg/dL — ABNORMAL HIGH (ref 70–99)

## 2018-03-26 MED ORDER — POTASSIUM CHLORIDE 10 MEQ/50ML IV SOLN
10.0000 meq | INTRAVENOUS | Status: AC
  Administered 2018-03-26 – 2018-03-27 (×4): 10 meq via INTRAVENOUS
  Filled 2018-03-26 (×3): qty 50

## 2018-03-26 MED ORDER — POTASSIUM CHLORIDE CRYS ER 20 MEQ PO TBCR
20.0000 meq | EXTENDED_RELEASE_TABLET | ORAL | Status: AC
  Administered 2018-03-26 (×3): 20 meq via ORAL
  Filled 2018-03-26 (×3): qty 1

## 2018-03-26 MED ORDER — POTASSIUM CHLORIDE CRYS ER 20 MEQ PO TBCR
40.0000 meq | EXTENDED_RELEASE_TABLET | Freq: Once | ORAL | Status: AC
  Administered 2018-03-26: 40 meq via ORAL
  Filled 2018-03-26: qty 2

## 2018-03-26 MED ORDER — POTASSIUM CHLORIDE 10 MEQ/50ML IV SOLN
10.0000 meq | INTRAVENOUS | Status: AC
  Administered 2018-03-26 (×4): 10 meq via INTRAVENOUS
  Filled 2018-03-26: qty 50

## 2018-03-26 MED ORDER — POTASSIUM CHLORIDE CRYS ER 20 MEQ PO TBCR: 20.0000 meq | EXTENDED_RELEASE_TABLET | ORAL | Status: DC | PRN

## 2018-03-26 NOTE — Plan of Care (Signed)
  Problem: Activity: Goal: Risk for activity intolerance will decrease Outcome: Progressing   Problem: Cardiac: Goal: Will achieve and/or maintain hemodynamic stability Outcome: Progressing   Problem: Respiratory: Goal: Respiratory status will improve Outcome: Progressing   Problem: Skin Integrity: Goal: Risk for impaired skin integrity will decrease Outcome: Progressing   Problem: Urinary Elimination: Goal: Ability to achieve and maintain adequate renal perfusion and functioning will improve Outcome: Progressing

## 2018-03-26 NOTE — Progress Notes (Signed)
4 Days Post-Op Procedure(s) (LRB): CORONARY ARTERY BYPASS GRAFTING (CABG) x 3 WITH ENDOSCOPIC HARVESTING OF RIGHT GREATER SAPHENOUS VEIN (N/A) TRANSESOPHAGEAL ECHOCARDIOGRAM (TEE) (N/A) LEFT ATRIAL APPENDAGE OCCLUSION USING 40 MM ATRICURE ATRICLIP (N/A) Subjective: No complaints, denies nausea and pain  Objective: Vital signs in last 24 hours: Temp:  [98 F (36.7 C)-98.6 F (37 C)] 98 F (36.7 C) (09/28 0747) Pulse Rate:  [87-104] 99 (09/28 0700) Cardiac Rhythm: Normal sinus rhythm;Sinus tachycardia (09/28 0400) Resp:  [16-37] 16 (09/28 0700) BP: (97-130)/(60-80) 110/68 (09/28 0700) SpO2:  [95 %-100 %] 96 % (09/28 0700) Weight:  [98.4 kg] 98.4 kg (09/28 0500)  Hemodynamic parameters for last 24 hours: CVP:  [2 mmHg-16 mmHg] 9 mmHg  Intake/Output from previous day: 09/27 0701 - 09/28 0700 In: 1178.8 [P.O.:670; I.V.:418.6; IV Piggyback:90.2] Out: 3402 [Urine:3370; [place]; Chest Tube:30] Intake/Output this shift: Total I/O In: -  Out: 125 [Urine:125]  General appearance: alert and cooperative Neurologic: intact Heart: regular rate and rhythm Lungs: diminished breath sounds bibasilar  Lab Results: Recent Labs    03/25/18 0416 03/26/18 0433  WBC 10.0 9.8  HGB 9.0* 9.0*  HCT 27.4* 27.5*  PLT 205 231   BMET:  Recent Labs    03/25/18 1701 03/26/18 0433  NA 133* 133*  K 3.7 3.0*  CL 98 98  CO2 23 26  GLUCOSE 122* 129*  BUN 36* 34*  CREATININE 1.54* 1.40*  CALCIUM 9.0 8.6*    PT/INR: No results for input(s): LABPROT, INR in the last 72 hours. ABG    Component Value Date/Time   PHART 7.408 03/23/2018 0642   HCO3 21.9 03/23/2018 0642   TCO2 21 (L) 03/23/2018 1736   ACIDBASEDEF 2.0 03/23/2018 0642   O2SAT 58.0 03/26/2018 0415   CBG (last 3)  Recent Labs    03/25/18 1516 03/25/18 2146 03/26/18 0659  GLUCAP 122* 105* 114*    Assessment/Plan: S/P Procedure(s) (LRB): CORONARY ARTERY BYPASS GRAFTING (CABG) x 3 WITH ENDOSCOPIC HARVESTING OF RIGHT  GREATER SAPHENOUS VEIN (N/A) TRANSESOPHAGEAL ECHOCARDIOGRAM (TEE) (N/A) LEFT ATRIAL APPENDAGE OCCLUSION USING 40 MM ATRICURE ATRICLIP (N/A) -CV- co-ox better this AM- up to 58 on milrinone 0.25 mcg/kg/min  In SR, BP OK  RESP- chest xray shows improving atelectasis- continue IS  RENAL- 2 liters negative with diuresis and creatinine improved  Hypokalemia- replenish K  ENDO- CBG well controlled  Anemia secondary to ABL- mild, follow  Mobilize   LOS: 10 days    Melrose Nakayama 03/26/2018

## 2018-03-26 NOTE — Progress Notes (Signed)
      [address]       [place] [address]             [phone]      No new issues   BP 131/90   Pulse 98   Temp 98.2 F (36.8 C) (Oral)   Resp (!) 30   Ht 6' (1.829 m)   Wt 98.4 kg   SpO2 97%   BMI 29.42 kg/m   Intake/Output Summary (Last 24 hours) at 03/26/2018 1929 Last data filed at 03/26/2018 1900 Gross per 24 hour  Intake 845.96 ml  Output 5125 ml  Net -4279.04 ml  Co-ox down a little to 53  Will recheck K  Steven C. Roxan Hockey, MD [facility] [phone]

## 2018-03-26 NOTE — Progress Notes (Signed)
Advanced Heart Failure Rounding Note   Subjective:    Feels better. Remains on milrinone 0.25. Diuresis much improved with IV lasix and metolazone. Co-ox up to 58% CVP 6-7.   No orthopnea or PND. Able to ambulate the unit with help.    Objective:   Weight Range:  Vital Signs:   Temp:  [98 F (36.7 C)-98.5 F (36.9 C)] 98.3 F (36.8 C) (09/28 1113) Pulse Rate:  [87-110] 94 (09/28 1400) Resp:  [16-37] 21 (09/28 1400) BP: (97-119)/(60-94) 117/94 (09/28 1300) SpO2:  [92 %-100 %] 94 % (09/28 1400) Weight:  [98.4 kg] 98.4 kg (09/28 0500) Last BM Date: 03/25/18  Weight change: Filed Weights   03/24/18 0500 03/25/18 0500 03/26/18 0500  Weight: 102.1 kg 101.3 kg 98.4 kg    Intake/Output:   Intake/Output Summary (Last 24 hours) at 03/26/2018 1502 Last data filed at 03/26/2018 1400 Gross per 24 hour  Intake 1081.1 ml  Output 4421 ml  Net -3339.9 ml     Physical Exam: General:  Sitting in chair No resp difficulty HEENT: normal Neck: supple. JVP 6-7. Carotids 2+ bilat; no bruits. No lymphadenopathy or thryomegaly appreciated. Cor: Sternal wound ok PMI nondisplaced. Regular rate & rhythm. No rubs, gallops or murmurs. Lungs: clear decreased in bases Abdomen: obese soft, nontender, nondistended. No hepatosplenomegaly. No bruits or masses. Good bowel sounds. Extremities: no cyanosis, clubbing, rash, 1+ edema Neuro: alert & orientedx3, cranial nerves grossly intact. moves all 4 extremities w/o difficulty. Affect pleasant  Telemetry:  NSR 90 Personally reviewed   Labs: Basic Metabolic Panel: Recent Labs  Lab 03/22/18 1829  03/23/18 0300 03/23/18 1731  03/24/18 0430 03/24/18 1159 03/25/18 0416 03/25/18 1701 03/26/18 0433  NA  --    < > 134*  --    < > 133* 135 132* 133* 133*  K  --    < > 4.3  --    < > 4.1 4.1 3.6 3.7 3.0*  CL  --    < > 104  --    < > 102 100 98 98 98  CO2  --   --  22  --   --  24 23 25 23 26   GLUCOSE  --    < > 114*  --    < > 127* 181* 144*  122* 129*  BUN  --    < > 16  --    < > 24* 27* 31* 36* 34*  CREATININE 1.12   < > 0.95 1.17   < > 1.35* 1.42* 1.27* 1.54* 1.40*  CALCIUM  --   --  8.6*  --   --  8.9 9.0 9.0 9.0 8.6*  MG 3.3*  --  2.5* 2.1  --   --  2.3  --   --   --    < > = values in this interval not displayed.    Liver Function Tests: Recent Labs  Lab 03/25/18 0416 03/26/18 0433  AST 25 22  ALT 32 28  ALKPHOS 56 60  BILITOT 0.7 0.7  PROT 5.3* 5.0*  ALBUMIN 2.5* 2.4*   No results for input(s): LIPASE, AMYLASE in the last 168 hours. No results for input(s): AMMONIA in the last 168 hours.  CBC: Recent Labs  Lab 03/23/18 0300 03/23/18 1731 03/23/18 1736 03/24/18 0430 03/25/18 0416 03/26/18 0433  WBC 13.6* 18.2*  --  13.2* 10.0 9.8  HGB 10.4* 10.5* 9.9* 9.7* 9.0* 9.0*  HCT 30.4* 31.2* 29.0* 28.9* 27.4*  27.5*  MCV 85.6 86.4  --  88.7 88.4 87.3  PLT 216 214  --  181 205 231    Cardiac Enzymes: No results for input(s): CKTOTAL, CKMB, CKMBINDEX, TROPONINI in the last 168 hours.  BNP: BNP (last 3 results) Recent Labs    03/14/18 0936  BNP 180.0*    ProBNP (last 3 results) No results for input(s): PROBNP in the last 8760 hours.    Other results:  Imaging: Dg Chest Port 1 View  Result Date: 03/26/2018 CLINICAL DATA:  Central catheter placement EXAM: PORTABLE CHEST 1 VIEW COMPARISON:  March 25, 2018 FINDINGS: Cordis has been removed. A peripherally inserted right central catheter has its tip in the azygos vein region. Chest tube on the left has been removed. No evident pneumothorax. There is atelectatic change in the left mid lung and both base regions. There is no frank consolidation. There is stable cardiomegaly with pulmonary vascularity normal. Patient is status post coronary artery bypass grafting. There is a left atrial appendage clamp present. There is aortic atherosclerosis. No bone lesions. IMPRESSION: New central catheter with tip in azygos vein. Interval removal of left chest tube  and right Cordis. No pneumothorax. Stable cardiomegaly. Lower lobe and left midlung atelectatic change present. No consolidation. There is aortic atherosclerosis. Aortic Atherosclerosis (ICD10-I70.0). Electronically Signed   By: Lowella Grip III M.D.   On: 03/26/2018 08:03   Dg Chest Port 1 View  Result Date: 03/25/2018 CLINICAL DATA:  Follow-up chest tube placement EXAM: PORTABLE CHEST 1 VIEW COMPARISON:  03/24/2018 FINDINGS: Cardiac shadow is enlarged but stable. Postsurgical changes are again seen. Left thoracostomy catheter is again noted. No pneumothorax is seen. Right jugular sheath is noted but kinked at the skin surface stable from the prior exam. Bibasilar atelectatic changes are again seen. IMPRESSION: Overall stable appearance of the chest with bibasilar atelectasis. Electronically Signed   By: Inez Catalina M.D.   On: 03/25/2018 07:41   [place] Ekg Site Rite  Result Date: 03/25/2018 If Site Rite image not attached, placement could not be confirmed due to current cardiac rhythm.     Medications:     Scheduled Medications: . acetaminophen  1,000 mg Oral Q6H   Or  . acetaminophen (TYLENOL) oral liquid 160 mg/5 mL  1,000 mg Per Tube Q6H  . amiodarone  400 mg Oral BID  . aspirin EC  325 mg Oral Daily   Or  . aspirin  324 mg Per Tube Daily  . atorvastatin  80 mg Oral q1800  . bisacodyl  10 mg Oral Daily   Or  . bisacodyl  10 mg Rectal Daily  . Chlorhexidine Gluconate Cloth  6 each Topical Daily  . docusate sodium  200 mg Oral Daily  . enoxaparin (LOVENOX) injection  40 mg Subcutaneous QHS  . furosemide  80 mg Intravenous BID  . insulin aspart  0-24 Units Subcutaneous TID WC & HS  . insulin detemir  10 Units Subcutaneous Daily  . metoCLOPramide (REGLAN) injection  10 mg Intravenous Q6H  . mupirocin cream   Topical BID  . pantoprazole  40 mg Oral Daily  . sodium chloride flush  10-40 mL Intracatheter Q12H  . sodium chloride flush  10-40 mL Intracatheter Q12H  . sodium  chloride flush  3 mL Intravenous Q12H  . spironolactone  12.5 mg Oral Daily  . traZODone  50 mg Oral QHS     Infusions: . sodium chloride Stopped (03/26/18 1146)  . 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    Plan/Discussion:    1.CAD s/p CABG x3  9/24 - CAD/NSTEMI. POD #4 - doing well - no angina. Continue ASA/statin - continue to ambulate  2. Acute diastolic HF - ECHO 1/47  LVEF normal. Limted ECHO on 9/26 showed LVEF 55-60% with normal RV. (I felt EF more like 45-50%) Milrinone turned up to 0.25 mcg on 9/27.  - CO-OX up to 58% this morning.  - Diuresis much improved with IV lasix and metolazone - Weight down 7 pounds overnight. Still about 8 pounds from baseline. CVP 6-7 (meadured personally) - Continue lasix 80 IV bid today. One more dose metolazone - Continue spiro 12.5 mg  daily.  - Off carvedilol for now.   3. PAF -On admit. Now in NSR - On amio 400 bid. Can drop to 200 bid tomorrow - No AC if remains in NSR   4. Anemia  Expected blood loss.   Hgb 9.0  Follow CBCs.   5. [place]  -9/26. Started lidocaine drip. - Lidocaine stopped 9/27 - Keep K >= 4.0 Mg >= 2.0 replaced. Mag 2.3   6. CKD Stage III - creatinine stable 1.3-1.4. Follow with diuresis   7. DM2 - consider Jardiance on d/c  Medication concerns reviewed with patient and pharmacy team. Barriers identified: n/a    Length of Stay: 10   [name] MD 03/26/2018, 3:02 PM  Advanced Heart Failure Team Pager [phone] (M-F; 7a - 4p)  Please contact [facility] for night-coverage after hours (4p -7a ) and weekends on [email or web address]

## 2018-03-27 ENCOUNTER — Inpatient Hospital Stay (HOSPITAL_COMMUNITY): Payer: Medicare Other

## 2018-03-27 DIAGNOSIS — I5033 Acute on chronic diastolic (congestive) heart failure: Secondary | ICD-10-CM

## 2018-03-27 LAB — CBC
HCT: 29.8 % — ABNORMAL LOW (ref 39.0–52.0)
Hemoglobin: 9.7 g/dL — ABNORMAL LOW (ref 13.0–17.0)
MCH: 28.4 pg (ref 26.0–34.0)
MCHC: 32.6 g/dL (ref 30.0–36.0)
MCV: 87.4 fL (ref 78.0–100.0)
Platelets: 297 10*3/uL (ref 150–400)
RBC: 3.41 MIL/uL — ABNORMAL LOW (ref 4.22–5.81)
RDW: 12.5 % (ref 11.5–15.5)
WBC: 10.2 10*3/uL (ref 4.0–10.5)

## 2018-03-27 LAB — COMPREHENSIVE METABOLIC PANEL
ALT: 31 U/L (ref 0–44)
AST: 27 U/L (ref 15–41)
Albumin: 2.5 g/dL — ABNORMAL LOW (ref 3.5–5.0)
Alkaline Phosphatase: 68 U/L (ref 38–126)
Anion gap: 10 (ref 5–15)
BUN: 36 mg/dL — ABNORMAL HIGH (ref 8–23)
CO2: 29 mmol/L (ref 22–32)
Calcium: 9.1 mg/dL (ref 8.9–10.3)
Chloride: 96 mmol/L — ABNORMAL LOW (ref 98–111)
Creatinine, Ser: 1.56 mg/dL — ABNORMAL HIGH (ref 0.61–1.24)
GFR calc Af Amer: 49 mL/min — ABNORMAL LOW (ref >60)
GFR calc non Af Amer: 42 mL/min — ABNORMAL LOW (ref >60)
Glucose, Bld: 111 mg/dL — ABNORMAL HIGH (ref 70–99)
Potassium: 3.8 mmol/L (ref 3.5–5.1)
Sodium: 135 mmol/L (ref 135–145)
Total Bilirubin: 0.7 mg/dL (ref 0.3–1.2)
Total Protein: 5.5 g/dL — ABNORMAL LOW (ref 6.5–8.1)

## 2018-03-27 LAB — GLUCOSE, CAPILLARY
Glucose-Capillary: 109 mg/dL — ABNORMAL HIGH (ref 70–99)
Glucose-Capillary: 103 mg/dL — ABNORMAL HIGH (ref 70–99)
Glucose-Capillary: 131 mg/dL — ABNORMAL HIGH (ref 70–99)
Glucose-Capillary: 118 mg/dL — ABNORMAL HIGH (ref 70–99)

## 2018-03-27 LAB — COOXEMETRY PANEL
Carboxyhemoglobin: 1.4 % (ref 0.5–1.5)
Carboxyhemoglobin: 1.5 % (ref 0.5–1.5)
Methemoglobin: 0.8 % (ref 0.0–1.5)
Methemoglobin: 1.2 % (ref 0.0–1.5)
O2 Saturation: 54.6 %
O2 Saturation: 54.1 %
Total hemoglobin: 15.4 g/dL (ref 12.0–16.0)
Total hemoglobin: 10.8 g/dL — ABNORMAL LOW (ref 12.0–16.0)

## 2018-03-27 MED ORDER — FUROSEMIDE 40 MG PO TABS
40.0000 mg | ORAL_TABLET | Freq: Every day | ORAL | Status: DC
  Administered 2018-03-27: 40 mg via ORAL
  Filled 2018-03-27 (×2): qty 1

## 2018-03-27 MED ORDER — POTASSIUM CHLORIDE CRYS ER 20 MEQ PO TBCR
20.0000 meq | EXTENDED_RELEASE_TABLET | Freq: Two times a day (BID) | ORAL | Status: DC
  Administered 2018-03-27 – 2018-03-30 (×7): 20 meq via ORAL
  Filled 2018-03-27 (×7): qty 1

## 2018-03-27 MED ORDER — AMIODARONE HCL 200 MG PO TABS
200.0000 mg | ORAL_TABLET | Freq: Two times a day (BID) | ORAL | Status: DC
  Administered 2018-03-27 – 2018-03-30 (×7): 200 mg via ORAL
  Filled 2018-03-27 (×7): qty 1

## 2018-03-27 NOTE — Progress Notes (Signed)
Patient ID: David Mccullough, male   DOB: 09/02/1945, 72 y.o.   MRN: [id number]    Advanced Heart Failure Rounding Note   Subjective:    Feels good today.  Remains on milrinone 0.25. Very vigorous diuresis yesterday, weight down.  CVP 7 this morning. Co-ox 55%.   No dyspnea, walked unit already this morning.    Objective:   Weight Range:  Vital Signs:   Temp:  [97.8 F (36.6 C)-98.4 F (36.9 C)] 97.8 F (36.6 C) (09/29 0747) Pulse Rate:  [85-110] 85 (09/29 0600) Resp:  [17-30] 17 (09/29 0600) BP: (112-138)/(70-94) 134/79 (09/29 0600) SpO2:  [92 %-100 %] 96 % (09/29 0600) Weight:  [95.1 kg] 95.1 kg (09/29 0600) Last BM Date: 03/25/18  Weight change: Filed Weights   03/25/18 0500 03/26/18 0500 03/27/18 0600  Weight: 101.3 kg 98.4 kg 95.1 kg    Intake/Output:   Intake/Output Summary (Last 24 hours) at 03/27/2018 0811 Last data filed at 03/27/2018 0700 Gross per 24 hour  Intake 566.37 ml  Output 5250 ml  Net -4683.63 ml     Physical Exam: General: NAD Neck: No JVD, no thyromegaly or thyroid nodule.  Lungs: Clear to auscultation bilaterally with normal respiratory effort. CV: Nondisplaced PMI.  Heart regular S1/S2, no S3/S4, no murmur.  No peripheral edema.   Abdomen: Soft, nontender, no hepatosplenomegaly, no distention.  Skin: Intact without lesions or rashes.  Neurologic: Alert and oriented x 3.  Psych: Normal affect. Extremities: No clubbing or cyanosis.  HEENT: Normal.   Telemetry:  NSR 90s Personally reviewed   Labs: Basic Metabolic Panel: Recent Labs  Lab 03/22/18 1829  03/23/18 0300 03/23/18 1731  03/24/18 1159 03/25/18 0416 03/25/18 1701 03/26/18 0433 03/26/18 1940 03/27/18 0422  NA  --    < > 134*  --    < > 135 132* 133* 133* 133* 135  K  --    < > 4.3  --    < > 4.1 3.6 3.7 3.0* 3.4* 3.8  CL  --    < > 104  --    < > 100 98 98 98  --  96*  CO2  --   --  22  --    < > 23 25 23 26   --  29  GLUCOSE  --    < > 114*  --    < > 181* 144* 122*  129* 133* 111*  BUN  --    < > 16  --    < > 27* 31* 36* 34*  --  36*  CREATININE 1.12   < > 0.95 1.17   < > 1.42* 1.27* 1.54* 1.40*  --  1.56*  CALCIUM  --   --  8.6*  --    < > 9.0 9.0 9.0 8.6*  --  9.1  MG 3.3*  --  2.5* 2.1  --  2.3  --   --   --   --   --    < > = values in this interval not displayed.    Liver Function Tests: Recent Labs  Lab 03/25/18 0416 03/26/18 0433 03/27/18 0422  AST 25 22 27   ALT 32 28 31  ALKPHOS 56 60 68  BILITOT 0.7 0.7 0.7  PROT 5.3* 5.0* 5.5*  ALBUMIN 2.5* 2.4* 2.5*   No results for input(s): LIPASE, AMYLASE in the last 168 hours. No results for input(s): AMMONIA in the last 168 hours.  CBC: Recent Labs  Lab 03/23/18  1731  03/24/18 0430 03/25/18 0416 03/26/18 0433 03/26/18 1940 03/27/18 0422  WBC 18.2*  --  13.2* 10.0 9.8  --  10.2  HGB 10.5*   < > 9.7* 9.0* 9.0* 8.8* 9.7*  HCT 31.2*   < > 28.9* 27.4* 27.5* 26.0* 29.8*  MCV 86.4  --  88.7 88.4 87.3  --  87.4  PLT 214  --  181 205 231  --  297   < > = values in this interval not displayed.    Cardiac Enzymes: No results for input(s): CKTOTAL, CKMB, CKMBINDEX, TROPONINI in the last 168 hours.  BNP: BNP (last 3 results) Recent Labs    03/14/18 0936  BNP 180.0*    ProBNP (last 3 results) No results for input(s): PROBNP in the last 8760 hours.    Other results:  Imaging: Dg Chest Port 1 View  Result Date: 03/27/2018 CLINICAL DATA:  CABG EXAM: PORTABLE CHEST 1 VIEW COMPARISON:  03/26/2018 FINDINGS: Sternotomy wires and atrial clip overlie normal cardiac silhouette. PICC line with tip in the azygos vein versus brachiocephalic vein. Mild bibasilar atelectasis. No pneumothorax. IMPRESSION: 1. No interval change. 2. Mild bibasilar atelectasis. Electronically Signed   By: Suzy Bouchard M.D.   On: 03/27/2018 07:44   Dg Chest Port 1 View  Result Date: 03/26/2018 CLINICAL DATA:  Central catheter placement EXAM: PORTABLE CHEST 1 VIEW COMPARISON:  March 25, 2018 FINDINGS:  Cordis has been removed. A peripherally inserted right central catheter has its tip in the azygos vein region. Chest tube on the left has been removed. No evident pneumothorax. There is atelectatic change in the left mid lung and both base regions. There is no frank consolidation. There is stable cardiomegaly with pulmonary vascularity normal. Patient is status post coronary artery bypass grafting. There is a left atrial appendage clamp present. There is aortic atherosclerosis. No bone lesions. IMPRESSION: New central catheter with tip in azygos vein. Interval removal of left chest tube and right Cordis. No pneumothorax. Stable cardiomegaly. Lower lobe and left midlung atelectatic change present. No consolidation. There is aortic atherosclerosis. Aortic Atherosclerosis (ICD10-I70.0). Electronically Signed   By: Lowella Grip III M.D.   On: 03/26/2018 08:03     Medications:     Scheduled Medications: . acetaminophen  1,000 mg Oral Q6H   Or  . acetaminophen (TYLENOL) oral liquid 160 mg/5 mL  1,000 mg Per Tube Q6H  . amiodarone  200 mg Oral BID  . aspirin EC  325 mg Oral Daily   Or  . aspirin  324 mg Per Tube Daily  . atorvastatin  80 mg Oral q1800  . bisacodyl  10 mg Oral Daily   Or  . bisacodyl  10 mg Rectal Daily  . Chlorhexidine Gluconate Cloth  6 each Topical Daily  . docusate sodium  200 mg Oral Daily  . enoxaparin (LOVENOX) injection  40 mg Subcutaneous QHS  . furosemide  40 mg Oral Daily  . insulin aspart  0-24 Units Subcutaneous TID WC & HS  . insulin detemir  10 Units Subcutaneous Daily  . metoCLOPramide (REGLAN) injection  10 mg Intravenous Q6H  . mupirocin cream   Topical BID  . pantoprazole  40 mg Oral Daily  . potassium chloride  20 mEq Oral BID  . sodium chloride flush  10-40 mL Intracatheter Q12H  . sodium chloride flush  10-40 mL Intracatheter Q12H  . sodium chloride flush  3 mL Intravenous Q12H  . spironolactone  12.5 mg Oral Daily  .  traZODone  50 mg Oral QHS     Infusions: . sodium chloride Stopped (03/26/18 1146)  . 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    Plan/Discussion:    1.CAD s/p CABG x3  9/24 - CAD/NSTEMI. POD #5 - doing well - no angina. Continue ASA/statin - continue to ambulate  2. Acute diastolic HF - ECHO 0/03  LVEF normal. Limted ECHO on 9/26 showed LVEF 55-60% with normal RV (echo reviewed and EF probably more in the 45-50% range).  - Milrinone 0.25 is ongoing, co-ox 55% today (has been fairly stable in this range).  He is doing very well, I will try decreasing the milrinone to 0.125 mcg/kg/min.  - Great diuresis yesterday, weight down another 7 lbs, CVP 7.  Transition to Lasix 40 mg po daily.  - Continue spiro 12.5 mg  daily.  - Off carvedilol for now.   3. PAF - On admit. Now in NSR - Decrease amiodarone to 200 mg bid.  - No AC if remains in NSR   4. Anemia  Expected blood loss.   Hgb 9.7  Follow CBCs.   5. [place]  - 9/26. Started lidocaine drip. - Lidocaine stopped 9/27 - Keep K >= 4.0 Mg >= 2.0 replaced. Mag 2.3  - Remains on amiodarone.   6. CKD Stage III - creatinine mildly higher, stopping IV diuresis.    7. DM2 - consider Jardiance on d/c  Medication concerns reviewed with patient and pharmacy team. Barriers identified: n/a    Length of Stay: 11   Loralie Champagne MD 03/27/2018, 8:11 AM  Advanced Heart Failure Team Pager [phone] (M-F; 7a - 4p)  Please contact [facility] for night-coverage after hours (4p -7a ) and weekends on [email or web address]

## 2018-03-27 NOTE — Progress Notes (Signed)
      [address]       [place] [address]             [phone]      Doing well today  BP 120/71   Pulse 97   Temp 97.8 F (36.6 C) (Oral)   Resp (!) 30   Ht 6' (1.829 m)   Wt 95.1 kg   SpO2 97%   BMI 28.43 kg/m   Intake/Output Summary (Last 24 hours) at 03/27/2018 1827 Last data filed at 03/27/2018 1800 Gross per 24 hour  Intake 370.76 ml  Output 3200 ml  Net -2829.24 ml   Continue current care  Devarius Nelles C. Roxan Hockey, MD [facility] [phone]

## 2018-03-27 NOTE — Plan of Care (Signed)
  Problem: Health Behavior/Discharge Planning: Goal: Ability to manage health-related needs will improve Outcome: Progressing   Problem: Clinical Measurements: Goal: Will remain free from infection Outcome: Progressing Goal: Diagnostic test results will improve Outcome: Progressing Goal: Cardiovascular complication will be avoided Outcome: Progressing   Problem: Coping: Goal: Level of anxiety will decrease Outcome: Progressing

## 2018-03-27 NOTE — Progress Notes (Addendum)
5 Days Post-Op Procedure(s) (LRB): CORONARY ARTERY BYPASS GRAFTING (CABG) x 3 WITH ENDOSCOPIC HARVESTING OF RIGHT GREATER SAPHENOUS VEIN (N/A) TRANSESOPHAGEAL ECHOCARDIOGRAM (TEE) (N/A) LEFT ATRIAL APPENDAGE OCCLUSION USING 40 MM ATRICURE ATRICLIP (N/A) Subjective: No complaints this AM  Objective: Vital signs in last 24 hours: Temp:  [97.8 F (36.6 C)-98.4 F (36.9 C)] 97.8 F (36.6 C) (09/29 0747) Pulse Rate:  [85-110] 85 (09/29 0600) Cardiac Rhythm: Normal sinus rhythm (09/29 0400) Resp:  [17-30] 17 (09/29 0600) BP: (112-138)/(69-94) 134/79 (09/29 0600) SpO2:  [92 %-100 %] 96 % (09/29 0600) Weight:  [95.1 kg] 95.1 kg (09/29 0600)  Hemodynamic parameters for last 24 hours: CVP:  [3 mmHg-14 mmHg] 11 mmHg  Intake/Output from previous day: 09/28 0701 - 09/29 0700 In: 943.5 [P.O.:360; I.V.:174.2; IV Piggyback:409.3] Out: 5375 [Urine:5375] Intake/Output this shift: No intake/output data recorded.  General appearance: alert, cooperative and no distress Neurologic: intact Heart: regular rate and rhythm Lungs: diminished breath sounds bibasilar Abdomen: normal findings: soft, non-tender  Lab Results: Recent Labs    03/26/18 0433 03/26/18 1940 03/27/18 0422  WBC 9.8  --  10.2  HGB 9.0* 8.8* 9.7*  HCT 27.5* 26.0* 29.8*  PLT 231  --  297   BMET:  Recent Labs    03/26/18 0433 03/26/18 1940 03/27/18 0422  NA 133* 133* 135  K 3.0* 3.4* 3.8  CL 98  --  96*  CO2 26  --  29  GLUCOSE 129* 133* 111*  BUN 34*  --  36*  CREATININE 1.40*  --  1.56*  CALCIUM 8.6*  --  9.1    PT/INR: No results for input(s): LABPROT, INR in the last 72 hours. ABG    Component Value Date/Time   PHART 7.408 03/23/2018 0642   HCO3 21.9 03/23/2018 0642   TCO2 21 (L) 03/23/2018 1736   ACIDBASEDEF 2.0 03/23/2018 0642   O2SAT 54.6 03/27/2018 0445   CBG (last 3)  Recent Labs    03/26/18 1623 03/26/18 2141 03/27/18 0708  GLUCAP 140* 104* 109*    Assessment/Plan: S/P Procedure(s)  (LRB): CORONARY ARTERY BYPASS GRAFTING (CABG) x 3 WITH ENDOSCOPIC HARVESTING OF RIGHT GREATER SAPHENOUS VEIN (N/A) TRANSESOPHAGEAL ECHOCARDIOGRAM (TEE) (N/A) LEFT ATRIAL APPENDAGE OCCLUSION USING 40 MM ATRICURE ATRICLIP (N/A) -CV- in SR on amiodarone, BP OK  Co-ox 55 on milrinone 0.25 mcg/kg/min  RESP- continue IS for mild bibasilar atelectasis  RENAL- creatinine up slightly after 4.5 L negative with diuresis  Will hold off on additional diuresis this AM until seen by AHF team  ENDO- CBG well controlled  GI- tolerating diet, + BM  Continue cardiac rehab   LOS: 11 days    David Mccullough 03/27/2018

## 2018-03-28 LAB — COOXEMETRY PANEL
Carboxyhemoglobin: 1.7 % — ABNORMAL HIGH (ref 0.5–1.5)
Methemoglobin: 0.8 % (ref 0.0–1.5)
O2 Saturation: 56.6 %
Total hemoglobin: 14.1 g/dL (ref 12.0–16.0)

## 2018-03-28 LAB — CBC
HCT: 30.4 % — ABNORMAL LOW (ref 39.0–52.0)
Hemoglobin: 9.8 g/dL — ABNORMAL LOW (ref 13.0–17.0)
MCH: 28.2 pg (ref 26.0–34.0)
MCHC: 32.2 g/dL (ref 30.0–36.0)
MCV: 87.4 fL (ref 78.0–100.0)
Platelets: 307 10*3/uL (ref 150–400)
RBC: 3.48 MIL/uL — ABNORMAL LOW (ref 4.22–5.81)
RDW: 12.5 % (ref 11.5–15.5)
WBC: 9.6 10*3/uL (ref 4.0–10.5)

## 2018-03-28 LAB — GLUCOSE, CAPILLARY
Glucose-Capillary: 113 mg/dL — ABNORMAL HIGH (ref 70–99)
Glucose-Capillary: 97 mg/dL (ref 70–99)
Glucose-Capillary: 93 mg/dL (ref 70–99)
Glucose-Capillary: 121 mg/dL — ABNORMAL HIGH (ref 70–99)
Glucose-Capillary: 131 mg/dL — ABNORMAL HIGH (ref 70–99)

## 2018-03-28 LAB — COMPREHENSIVE METABOLIC PANEL
ALT: 29 U/L (ref 0–44)
AST: 24 U/L (ref 15–41)
Albumin: 2.5 g/dL — ABNORMAL LOW (ref 3.5–5.0)
Alkaline Phosphatase: 69 U/L (ref 38–126)
Anion gap: 10 (ref 5–15)
BUN: 31 mg/dL — ABNORMAL HIGH (ref 8–23)
CO2: 27 mmol/L (ref 22–32)
Calcium: 8.9 mg/dL (ref 8.9–10.3)
Chloride: 97 mmol/L — ABNORMAL LOW (ref 98–111)
Creatinine, Ser: 1.34 mg/dL — ABNORMAL HIGH (ref 0.61–1.24)
GFR calc Af Amer: 59 mL/min — ABNORMAL LOW (ref >60)
GFR calc non Af Amer: 51 mL/min — ABNORMAL LOW (ref >60)
Glucose, Bld: 163 mg/dL — ABNORMAL HIGH (ref 70–99)
Potassium: 3.8 mmol/L (ref 3.5–5.1)
Sodium: 134 mmol/L — ABNORMAL LOW (ref 135–145)
Total Bilirubin: 0.8 mg/dL (ref 0.3–1.2)
Total Protein: 5.4 g/dL — ABNORMAL LOW (ref 6.5–8.1)

## 2018-03-28 LAB — MAGNESIUM: Magnesium: 1.8 mg/dL (ref 1.7–2.4)

## 2018-03-28 MED ORDER — MAGNESIUM SULFATE 2 GM/50ML IV SOLN
2.0000 g | Freq: Once | INTRAVENOUS | Status: AC
  Administered 2018-03-28: 2 g via INTRAVENOUS
  Filled 2018-03-28: qty 50

## 2018-03-28 MED ORDER — FUROSEMIDE 40 MG PO TABS
40.0000 mg | ORAL_TABLET | Freq: Every day | ORAL | Status: DC
  Administered 2018-03-29 – 2018-03-30 (×2): 40 mg via ORAL
  Filled 2018-03-28 (×2): qty 1

## 2018-03-28 MED ORDER — FUROSEMIDE 40 MG PO TABS
40.0000 mg | ORAL_TABLET | Freq: Once | ORAL | Status: DC
  Filled 2018-03-28: qty 1

## 2018-03-28 MED ORDER — POTASSIUM CHLORIDE CRYS ER 20 MEQ PO TBCR
20.0000 meq | EXTENDED_RELEASE_TABLET | Freq: Once | ORAL | Status: AC
  Administered 2018-03-28: 20 meq via ORAL
  Filled 2018-03-28: qty 1

## 2018-03-28 MED ORDER — FUROSEMIDE 80 MG PO TABS
80.0000 mg | ORAL_TABLET | Freq: Once | ORAL | Status: AC
  Administered 2018-03-28: 80 mg via ORAL

## 2018-03-28 NOTE — Plan of Care (Signed)
  Problem: Health Behavior/Discharge Planning: Goal: Ability to manage health-related needs will improve Outcome: Progressing   Problem: Clinical Measurements: Goal: Ability to maintain clinical measurements within normal limits will improve Outcome: Progressing Goal: Will remain free from infection Outcome: Progressing Goal: Diagnostic test results will improve Outcome: Progressing Goal: Respiratory complications will improve Outcome: Progressing Goal: Cardiovascular complication will be avoided Outcome: Progressing   Problem: Activity: Goal: Risk for activity intolerance will decrease Outcome: Progressing   Problem: Nutrition: Goal: Adequate nutrition will be maintained Outcome: Progressing   Problem: Coping: Goal: Level of anxiety will decrease Outcome: Progressing   Problem: Elimination: Goal: Will not experience complications related to bowel motility Outcome: Progressing Goal: Will not experience complications related to urinary retention Outcome: Progressing   Problem: Pain Managment: Goal: General experience of comfort will improve Outcome: Progressing   Problem: Safety: Goal: Ability to remain free from injury will improve Outcome: Progressing   Problem: Skin Integrity: Goal: Risk for impaired skin integrity will decrease Outcome: Progressing   Problem: Education: Goal: Knowledge of disease or condition will improve Outcome: Progressing Goal: Understanding of medication regimen will improve Outcome: Progressing Goal: Individualized Educational Video(s) Outcome: Progressing   Problem: Activity: Goal: Ability to tolerate increased activity will improve Outcome: Progressing   Problem: Cardiac: Goal: Ability to achieve and maintain adequate cardiopulmonary perfusion will improve Outcome: Progressing   Problem: Health Behavior/Discharge Planning: Goal: Ability to safely manage health-related needs after discharge will improve Outcome: Progressing    Problem: Education: Goal: Understanding of cardiac disease, CV risk reduction, and recovery process will improve Outcome: Progressing Goal: Understanding of medication regimen will improve Outcome: Progressing Goal: Individualized Educational Video(s) Outcome: Progressing   Problem: Activity: Goal: Ability to tolerate increased activity will improve Outcome: Progressing   Problem: Cardiac: Goal: Ability to achieve and maintain adequate cardiopulmonary perfusion will improve Outcome: Progressing Goal: Vascular access site(s) Level 0-1 will be maintained Outcome: Progressing   Problem: Health Behavior/Discharge Planning: Goal: Ability to safely manage health-related needs after discharge will improve Outcome: Progressing   Problem: Education: Goal: Will demonstrate proper wound care and an understanding of methods to prevent future damage Outcome: Progressing Goal: Knowledge of disease or condition will improve Outcome: Progressing Goal: Knowledge of the prescribed therapeutic regimen will improve Outcome: Progressing Goal: Individualized Educational Video(s) Outcome: Progressing   Problem: Activity: Goal: Risk for activity intolerance will decrease Outcome: Progressing   Problem: Cardiac: Goal: Will achieve and/or maintain hemodynamic stability Outcome: Progressing   Problem: Clinical Measurements: Goal: Postoperative complications will be avoided or minimized Outcome: Progressing   Problem: Respiratory: Goal: Respiratory status will improve Outcome: Progressing   Problem: Skin Integrity: Goal: Wound healing without signs and symptoms of infection Outcome: Progressing Goal: Risk for impaired skin integrity will decrease Outcome: Progressing   Problem: Urinary Elimination: Goal: Ability to achieve and maintain adequate renal perfusion and functioning will improve Outcome: Progressing

## 2018-03-28 NOTE — Progress Notes (Addendum)
[address]       [place] [address]             [phone]      6 Days Post-Op Procedure(s) (LRB): CORONARY ARTERY BYPASS GRAFTING (CABG) x 3 WITH ENDOSCOPIC HARVESTING OF RIGHT GREATER SAPHENOUS VEIN (N/A) TRANSESOPHAGEAL ECHOCARDIOGRAM (TEE) (N/A) LEFT ATRIAL APPENDAGE OCCLUSION USING 40 MM ATRICURE ATRICLIP (N/A) Subjective: conts to progress, Coox stable at 56 on .125 milrinone Objective: Vital signs in last 24 hours: Temp:  [97.8 F (36.6 C)-98.9 F (37.2 C)] 98.4 F (36.9 C) (09/30 0400) Pulse Rate:  [83-97] 96 (09/30 0746) Cardiac Rhythm: Normal sinus rhythm (09/30 0400) Resp:  [15-31] 24 (09/30 0700) BP: (87-127)/(59-79) 117/66 (09/30 0700) SpO2:  [92 %-98 %] 96 % (09/30 0746) Weight:  [94.6 kg] 94.6 kg (09/30 0441)  Hemodynamic parameters for last 24 hours: CVP:  [6 mmHg-10 mmHg] 10 mmHg  Intake/Output from previous day: 09/29 0701 - 09/30 0700 In: 89.8 [I.V.:89.8] Out: 1475 [Urine:1475] Intake/Output this shift: No intake/output data recorded.  General appearance: alert, cooperative and no distress Heart: regular rate and rhythm Lungs: dim in bases Abdomen: benign Extremities: incis healing well Wound: incis healing well Extremities: min edema  Lab Results: Recent Labs    03/27/18 0422 03/28/18 0438  WBC 10.2 9.6  HGB 9.7* 9.8*  HCT 29.8* 30.4*  PLT 297 307   BMET:  Recent Labs    03/27/18 0422 03/28/18 0438  NA 135 134*  K 3.8 3.8  CL 96* 97*  CO2 29 27  GLUCOSE 111* 163*  BUN 36* 31*  CREATININE 1.56* 1.34*  CALCIUM 9.1 8.9    PT/INR: No results for input(s): LABPROT, INR in the last 72 hours. ABG    Component Value Date/Time   PHART 7.408 03/23/2018 0642   HCO3 21.9 03/23/2018 0642   TCO2 21 (L) 03/23/2018 1736   ACIDBASEDEF 2.0 03/23/2018 0642   O2SAT 56.6 03/28/2018 0615   CBG (last 3)  Recent Labs    03/27/18 1611 03/27/18 2111 03/28/18 0703  GLUCAP 131* 118* 97    Meds Scheduled  Meds: . amiodarone  200 mg Oral BID  . aspirin EC  325 mg Oral Daily   Or  . aspirin  324 mg Per Tube Daily  . atorvastatin  80 mg Oral q1800  . bisacodyl  10 mg Oral Daily   Or  . bisacodyl  10 mg Rectal Daily  . Chlorhexidine Gluconate Cloth  6 each Topical Daily  . docusate sodium  200 mg Oral Daily  . enoxaparin (LOVENOX) injection  40 mg Subcutaneous QHS  . furosemide  40 mg Oral Daily  . insulin aspart  0-24 Units Subcutaneous TID WC & HS  . insulin detemir  10 Units Subcutaneous Daily  . metoCLOPramide (REGLAN) injection  10 mg Intravenous Q6H  . mupirocin cream   Topical BID  . pantoprazole  40 mg Oral Daily  . potassium chloride  20 mEq Oral BID  . potassium chloride  20 mEq Oral Once  . sodium chloride flush  10-40 mL Intracatheter Q12H  . sodium chloride flush  10-40 mL Intracatheter Q12H  . sodium chloride flush  3 mL Intravenous Q12H  . spironolactone  12.5 mg Oral Daily  . traZODone  50 mg Oral QHS   Continuous Infusions: . sodium chloride Stopped (03/26/18 1146)  . milrinone 0.125 mcg/kg/min (03/28/18 0700)   PRN Meds:.sodium chloride, ondansetron (ZOFRAN) IV, oxyCODONE, sodium chloride flush, sodium  chloride flush, sodium chloride flush, traMADol  Xrays Dg Chest Port 1 View  Result Date: 03/27/2018 CLINICAL DATA:  CABG EXAM: PORTABLE CHEST 1 VIEW COMPARISON:  03/26/2018 FINDINGS: Sternotomy wires and atrial clip overlie normal cardiac silhouette. PICC line with tip in the azygos vein versus brachiocephalic vein. Mild bibasilar atelectasis. No pneumothorax. IMPRESSION: 1. No interval change. 2. Mild bibasilar atelectasis. Electronically Signed   By: Suzy Bouchard M.D.   On: 03/27/2018 07:44    Assessment/Plan: S/P Procedure(s) (LRB): CORONARY ARTERY BYPASS GRAFTING (CABG) x 3 WITH ENDOSCOPIC HARVESTING OF RIGHT GREATER SAPHENOUS VEIN (N/A) TRANSESOPHAGEAL ECHOCARDIOGRAM (TEE) (N/A) LEFT ATRIAL APPENDAGE OCCLUSION USING 40 MM ATRICURE ATRICLIP (N/A)  1  conts to progress well 2 hopefully can wean off milrinone today 3 creat improved, cont diuretic 4 H/H improved  5 sugars reasonably controlled, hopefully d/c insulin soon 6 tc to 2 c 7 push rehab as able/ routine pulm toilet  LOS: 12 days    David Mccullough 03/28/2018 patient examined and medical record reviewed,agree with above note. David Mccullough 03/28/2018

## 2018-03-28 NOTE — Progress Notes (Signed)
Physical Therapy Treatment Patient Details Name: David Mccullough MRN: [id number] DOB: 1946/03/08 Today's Date: 03/28/2018    History of Present Illness 72 yo male with SVT s/p CABG x3 9/24. Pt admitted to [facility] 9/16 with NSTEMI, [facility]. Pt with cardiac cath 9/17. PMHx A-fib, CAD, tachycardia.      PT Comments    Pt sitting up in chair on arrival, reporting could not sleep last night and previously walked at 4am. Pt able to progress ambulation to 400 ft min guard, first 9 with RW and steady, remaining without AD. One posterior LOB requiring assist for correction, pt encouraged to continue use of RW for safety with nursing during ambulation. Reviewed sternal precautions with pt, able to recall no pushing, lifting, and keep arms in. Continues to need reinforcement and verbal cues (though less frequently) to adhere to precautions with activity.      Follow Up Recommendations  Home health PT     Equipment Recommendations  Rolling walker with 5" wheels;3in1 (PT)    Recommendations for Other Services       Precautions / Restrictions Precautions Precautions: Sternal;Fall Precaution Comments: precautions reviewed with pt able to recall "no pushing, no lifting"  Restrictions Weight Bearing Restrictions: Yes(sternal precautions)    Mobility  Bed Mobility Overal bed mobility: Needs Assistance Bed Mobility: Sidelying to Sit;Sit to Sidelying   Sidelying to sit: Min guard     Sit to sidelying: Min assist General bed mobility comments: in chair on arrival . to side from sitting min assist for bil LE raise, able to transition OOB without assist  Transfers Overall transfer level: Needs assistance   Transfers: Sit to/from Stand Sit to Stand: Min guard         General transfer comment: cues for hand placement, safety and lines with increased time to rise  Ambulation/Gait Ambulation/Gait assistance: Min guard Gait Distance (Feet): 400 Feet Assistive device: Rolling walker  (2 wheeled);None Gait Pattern/deviations: Decreased stride length;Trunk flexed;Step-through pattern   Gait velocity interpretation: 1.31 - 2.62 ft/sec, indicative of limited community ambulator General Gait Details: overall steady with use of RW,  walked 22' with RW and remainder without. Without RW one LOB with tactile cues and min correct. verbal cues for upright posture. SpO2 95% on RA, HR 105   Stairs             Wheelchair Mobility    Modified Rankin (Stroke Patients Only)       Balance Overall balance assessment: Needs assistance   Sitting balance-Leahy Scale: Fair       Standing balance-Leahy Scale: Fair Standing balance comment: able to stand without UE support, RW support for gait                            Cognition Arousal/Alertness: Awake/alert Behavior During Therapy: WFL for tasks assessed/performed Overall Cognitive Status: Within Functional Limits for tasks assessed                                        Exercises      General Comments        Pertinent Vitals/Pain Pain Assessment: No/denies pain    Home Living                      Prior Function  PT Goals (current goals can now be found in the care plan section) Progress towards PT goals: Progressing toward goals    Frequency           PT Plan Current plan remains appropriate    Co-evaluation              AM-PAC PT "6 Clicks" Daily Activity  Outcome Measure  Difficulty turning over in bed (including adjusting bedclothes, sheets and blankets)?: A Lot Difficulty moving from lying on back to sitting on the side of the bed? : A Lot Difficulty sitting down on and standing up from a chair with arms (e.g., wheelchair, bedside commode, etc,.)?: A Little Help needed moving to and from a bed to chair (including a wheelchair)?: A Little Help needed walking in hospital room?: A Little Help needed climbing 3-5 steps with a railing? :  A Lot 6 Click Score: 15    End of Session Equipment Utilized During Treatment: Gait belt Activity Tolerance: Patient tolerated treatment well Patient left: in chair;with call bell/phone within reach Nurse Communication: Mobility status PT Visit Diagnosis: Other abnormalities of gait and mobility (R26.89);Unsteadiness on feet (R26.81)     Time: 0727-0750 PT Time Calculation (min) (ACUTE ONLY): 23 min  Charges:  $Gait Training: 8-22 mins $Therapeutic Activity: 8-22 mins                     Samuella Bruin, [place]  Acute Rehab [phone]    Samuella Bruin 03/28/2018, 10:33 AM

## 2018-03-28 NOTE — Progress Notes (Addendum)
Patient ID: David Mccullough, male   DOB: [birth year], 72 y.o.   MRN: [id number]    Advanced Heart Failure Rounding Note   Subjective:    Milrinone decreased to 0.125 mcg/kg/min yesterday. Coox 57%. CVP 8. Less UOP on PO lasix yesterday, but still -1.4 L. Weight down 1 lb (down 17 lbs from highest weight) - weight is back to baseline. Creatinine stable 1.34  Denies CP or SOB. Walked in hallways this morning with no problems. Didn't sleep well. Feels tired.  Objective:   Weight Range:  Vital Signs:   Temp:  [97.8 F (36.6 C)-98.9 F (37.2 C)] 98.4 F (36.9 C) (09/30 0400) Pulse Rate:  [83-97] 94 (09/30 0700) Resp:  [15-31] 24 (09/30 0700) BP: (87-127)/(59-79) 117/66 (09/30 0700) SpO2:  [92 %-98 %] 95 % (09/30 0700) Weight:  [94.6 kg] 94.6 kg (09/30 0441) Last BM Date: 03/27/18  Weight change: Filed Weights   03/26/18 0500 03/27/18 0600 03/28/18 0441  Weight: 98.4 kg 95.1 kg 94.6 kg    Intake/Output:   Intake/Output Summary (Last 24 hours) at 03/28/2018 0722 Last data filed at 03/28/2018 0700 Gross per 24 hour  Intake 89.79 ml  Output 1475 ml  Net -1385.21 ml     Physical Exam: General: No resp difficulty. Lying flat in bed HEENT: Normal Neck: Supple. JVP 8. Carotids 2+ bilat; no bruits. No thyromegaly or nodule noted. Cor: PMI nondisplaced. RRR, No M/G/R noted. Midsternal incision CDI. Epicardial wires in place.  Lungs: CTAB, normal effort. occ wheeze. Abdomen: Obese Soft, non-tender, non-distended, no HSM. No bruits or masses. +BS  Extremities: No cyanosis, clubbing, or rash. R and LLE trace edema. Warm Neuro: Alert & orientedx3, cranial nerves grossly intact. moves all 4 extremities w/o difficulty. Affect pleasant   Telemetry: NSR 90s. Personally reviewed.    Labs: Basic Metabolic Panel: Recent Labs  Lab 03/22/18 1829  03/23/18 0300 03/23/18 1731  03/24/18 1159 03/25/18 0416 03/25/18 1701 03/26/18 0433 03/26/18 1940 03/27/18 0422 03/28/18 0438  NA   --    < > 134*  --    < > 135 132* 133* 133* 133* 135 134*  K  --    < > 4.3  --    < > 4.1 3.6 3.7 3.0* 3.4* 3.8 3.8  CL  --    < > 104  --    < > 100 98 98 98  --  96* 97*  CO2  --   --  22  --    < > 23 25 23 26   --  29 27  GLUCOSE  --    < > 114*  --    < > 181* 144* 122* 129* 133* 111* 163*  BUN  --    < > 16  --    < > 27* 31* 36* 34*  --  36* 31*  CREATININE 1.12   < > 0.95 1.17   < > 1.42* 1.27* 1.54* 1.40*  --  1.56* 1.34*  CALCIUM  --   --  8.6*  --    < > 9.0 9.0 9.0 8.6*  --  9.1 8.9  MG 3.3*  --  2.5* 2.1  --  2.3  --   --   --   --   --   --    < > = values in this interval not displayed.    Liver Function Tests: Recent Labs  Lab 03/25/18 0416 03/26/18 0433 03/27/18 0422 03/28/18 0438  AST 25 22  27 24  ALT 32 28 31 29   ALKPHOS 56 60 68 69  BILITOT 0.7 0.7 0.7 0.8  PROT 5.3* 5.0* 5.5* 5.4*  ALBUMIN 2.5* 2.4* 2.5* 2.5*   No results for input(s): LIPASE, AMYLASE in the last 168 hours. No results for input(s): AMMONIA in the last 168 hours.  CBC: Recent Labs  Lab 03/24/18 0430 03/25/18 0416 03/26/18 0433 03/26/18 1940 03/27/18 0422 03/28/18 0438  WBC 13.2* 10.0 9.8  --  10.2 9.6  HGB 9.7* 9.0* 9.0* 8.8* 9.7* 9.8*  HCT 28.9* 27.4* 27.5* 26.0* 29.8* 30.4*  MCV 88.7 88.4 87.3  --  87.4 87.4  PLT 181 205 231  --  297 307    Cardiac Enzymes: No results for input(s): CKTOTAL, CKMB, CKMBINDEX, TROPONINI in the last 168 hours.  BNP: BNP (last 3 results) Recent Labs    03/14/18 0936  BNP 180.0*    ProBNP (last 3 results) No results for input(s): PROBNP in the last 8760 hours.    Other results:  Imaging: Dg Chest Port 1 View  Result Date: 03/27/2018 CLINICAL DATA:  CABG EXAM: PORTABLE CHEST 1 VIEW COMPARISON:  03/26/2018 FINDINGS: Sternotomy wires and atrial clip overlie normal cardiac silhouette. PICC line with tip in the azygos vein versus brachiocephalic vein. Mild bibasilar atelectasis. No pneumothorax. IMPRESSION: 1. No interval change. 2. Mild  bibasilar atelectasis. Electronically Signed   By: Suzy Bouchard M.D.   On: 03/27/2018 07:44     Medications:     Scheduled Medications: . amiodarone  200 mg Oral BID  . aspirin EC  325 mg Oral Daily   Or  . aspirin  324 mg Per Tube Daily  . atorvastatin  80 mg Oral q1800  . bisacodyl  10 mg Oral Daily   Or  . bisacodyl  10 mg Rectal Daily  . Chlorhexidine Gluconate Cloth  6 each Topical Daily  . docusate sodium  200 mg Oral Daily  . enoxaparin (LOVENOX) injection  40 mg Subcutaneous QHS  . furosemide  40 mg Oral Daily  . insulin aspart  0-24 Units Subcutaneous TID WC & HS  . insulin detemir  10 Units Subcutaneous Daily  . metoCLOPramide (REGLAN) injection  10 mg Intravenous Q6H  . mupirocin cream   Topical BID  . pantoprazole  40 mg Oral Daily  . potassium chloride  20 mEq Oral BID  . sodium chloride flush  10-40 mL Intracatheter Q12H  . sodium chloride flush  10-40 mL Intracatheter Q12H  . sodium chloride flush  3 mL Intravenous Q12H  . spironolactone  12.5 mg Oral Daily  . traZODone  50 mg Oral QHS    Infusions: . sodium chloride Stopped (03/26/18 1146)  . 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    Plan/Discussion:    1.CAD s/p CABG x3  9/24 - CAD/NSTEMI. POD #5 - No s/s ischemia. Continue ASA/statin - continue to ambulate - Epicardial wires still in place  2. Acute diastolic HF - ECHO 7/61  LVEF normal. Limted ECHO on 9/26 showed LVEF 55-60% with normal RV (echo reviewed and EF probably more in the 45-50% range).  - Milrinone decreased to 0.125 yesterday. Coox 57% today. May  be able to stop today.  - Continue Lasix 40 mg po daily. CVP  10 - Continue spiro 12.5 mg  daily.  - Off carvedilol for now.   3. PAF - On admit. Remains in NSR - Continue amiodarone 200 mg bid (decreased 9/29).  - No AC if remains in NSR   4. Anemia  Expected blood loss.   Hgb stable 9.6 Follow CBCs.   5. [place]  - 9/26. Started lidocaine drip. - Lidocaine stopped 9/27 - Keep K >= 4.0 Mg >= 2.0. K 3.8. Supp with extra 20 today. Add on mag. - Remains on amiodarone 200 mg BID.   6. CKD Stage III - creatinine back down 1.56 > 1.34 today.  7. DM2 - consider Jardiance on d/c. No change.   Medication concerns reviewed with patient and pharmacy team. Barriers identified: n/a    Length of Stay: [place] NP 03/28/2018, 7:22 AM  Advanced Heart Failure Team Pager [phone] (M-F; 7a - 4p)  Please contact [facility] for night-coverage after hours (4p -7a ) and weekends on [email or web address]  Patient seen and examined with the above-signed Advanced Practice Provider and/or Housestaff. I personally reviewed laboratory data, imaging studies and relevant notes. I independently examined the patient and formulated the important aspects of the plan. I have edited the note to reflect any of my changes or salient points. I have personally discussed the plan with the patient and/or family.  Much improved. Volume status now back to baseline. Co-ox on low end but not too bad. Renal function stable. Will stop milrinone today. Use lasix 80 po today and reassess tomorrow. Rhythm stable on amio. Consider Jardiance at discharge to help with volume status and risk reduction.   [name], MD  8:42 AM

## 2018-03-28 NOTE — Progress Notes (Signed)
AV pacing wires removed per protocol. Wires intact. Patient instructed to remain in bed until 935. Verbalized understanding.

## 2018-03-28 NOTE — Progress Notes (Signed)
CARDIAC REHAB PHASE I   PRE:  Rate/Rhythm: 94 SR    BP: sitting 111/76    SaO2: 94 RA  MODE:  Ambulation: 220 ft   POST:  Rate/Rhythm: 105 ST    BP: sitting 114/65     SaO2: 96 RA  Pt wanted to try walking without RW. Able to stand with min assist and walk without RW, assist x1. Pt fairly steady although did pitch forward a little at times. He had to rush back to room due to urge to urinate. To bed independently (pt wanted to practice). Wife present. Encouraged IS and more walking. [place], ACSM 03/28/2018 1:17 PM

## 2018-03-29 LAB — COMPREHENSIVE METABOLIC PANEL
ALBUMIN: 2.6 g/dL — AB (ref 3.5–5.0)
ALK PHOS: 77 U/L (ref 38–126)
ALT: 32 U/L (ref 0–44)
ANION GAP: 8 (ref 5–15)
AST: 29 U/L (ref 15–41)
BILIRUBIN TOTAL: 0.7 mg/dL (ref 0.3–1.2)
BUN: 29 mg/dL — AB (ref 8–23)
CALCIUM: 9 mg/dL (ref 8.9–10.3)
CO2: 28 mmol/L (ref 22–32)
Chloride: 98 mmol/L (ref 98–111)
Creatinine, Ser: 1.49 mg/dL — ABNORMAL HIGH (ref 0.61–1.24)
GFR calc Af Amer: 52 mL/min — ABNORMAL LOW (ref >60)
GFR calc non Af Amer: 45 mL/min — ABNORMAL LOW (ref >60)
GLUCOSE: 102 mg/dL — AB (ref 70–99)
Potassium: 4.1 mmol/L (ref 3.5–5.1)
Sodium: 134 mmol/L — ABNORMAL LOW (ref 135–145)
TOTAL PROTEIN: 5.7 g/dL — AB (ref 6.5–8.1)

## 2018-03-29 LAB — CBC
HEMATOCRIT: 32.4 % — AB (ref 39.0–52.0)
HEMOGLOBIN: 10.5 g/dL — AB (ref 13.0–17.0)
MCH: 28.5 pg (ref 26.0–34.0)
MCHC: 32.4 g/dL (ref 30.0–36.0)
MCV: 87.8 fL (ref 78.0–100.0)
Platelets: 382 10*3/uL (ref 150–400)
RBC: 3.69 MIL/uL — AB (ref 4.22–5.81)
RDW: 12.5 % (ref 11.5–15.5)
WBC: 12.1 10*3/uL — AB (ref 4.0–10.5)

## 2018-03-29 LAB — COOXEMETRY PANEL
CARBOXYHEMOGLOBIN: 1.6 % — AB (ref 0.5–1.5)
Carboxyhemoglobin: 1.7 % — ABNORMAL HIGH (ref 0.5–1.5)
Methemoglobin: 0.9 % (ref 0.0–1.5)
Methemoglobin: 0.9 % (ref 0.0–1.5)
O2 Saturation: 49.4 %
O2 Saturation: 48.3 %
TOTAL HEMOGLOBIN: 10.3 g/dL — AB (ref 12.0–16.0)
Total hemoglobin: 10.9 g/dL — ABNORMAL LOW (ref 12.0–16.0)

## 2018-03-29 LAB — MAGNESIUM: Magnesium: 2 mg/dL (ref 1.7–2.4)

## 2018-03-29 LAB — GLUCOSE, CAPILLARY
Glucose-Capillary: 112 mg/dL — ABNORMAL HIGH (ref 70–99)
Glucose-Capillary: 131 mg/dL — ABNORMAL HIGH (ref 70–99)
Glucose-Capillary: 131 mg/dL — ABNORMAL HIGH (ref 70–99)

## 2018-03-29 NOTE — Plan of Care (Signed)
  Problem: Health Behavior/Discharge Planning: Goal: Ability to manage health-related needs will improve Outcome: Progressing   Problem: Clinical Measurements: Goal: Ability to maintain clinical measurements within normal limits will improve Outcome: Progressing Goal: Will remain free from infection Outcome: Progressing Goal: Diagnostic test results will improve Outcome: Progressing Goal: Respiratory complications will improve Outcome: Progressing Goal: Cardiovascular complication will be avoided Outcome: Progressing   Problem: Activity: Goal: Risk for activity intolerance will decrease Outcome: Progressing   Problem: Nutrition: Goal: Adequate nutrition will be maintained Outcome: Progressing   Problem: Coping: Goal: Level of anxiety will decrease Outcome: Progressing   Problem: Elimination: Goal: Will not experience complications related to bowel motility Outcome: Progressing Goal: Will not experience complications related to urinary retention Outcome: Progressing   Problem: Pain Managment: Goal: General experience of comfort will improve Outcome: Progressing   Problem: Safety: Goal: Ability to remain free from injury will improve Outcome: Progressing   Problem: Skin Integrity: Goal: Risk for impaired skin integrity will decrease Outcome: Progressing   Problem: Education: Goal: Knowledge of disease or condition will improve Outcome: Progressing Goal: Understanding of medication regimen will improve Outcome: Progressing Goal: Individualized Educational Video(s) Outcome: Progressing   Problem: Activity: Goal: Ability to tolerate increased activity will improve Outcome: Progressing   Problem: Cardiac: Goal: Ability to achieve and maintain adequate cardiopulmonary perfusion will improve Outcome: Progressing   Problem: Health Behavior/Discharge Planning: Goal: Ability to safely manage health-related needs after discharge will improve Outcome: Progressing    Problem: Education: Goal: Understanding of cardiac disease, CV risk reduction, and recovery process will improve Outcome: Progressing Goal: Understanding of medication regimen will improve Outcome: Progressing Goal: Individualized Educational Video(s) Outcome: Progressing   Problem: Activity: Goal: Ability to tolerate increased activity will improve Outcome: Progressing   Problem: Cardiac: Goal: Ability to achieve and maintain adequate cardiopulmonary perfusion will improve Outcome: Progressing Goal: Vascular access site(s) Level 0-1 will be maintained Outcome: Progressing   Problem: Health Behavior/Discharge Planning: Goal: Ability to safely manage health-related needs after discharge will improve Outcome: Progressing   Problem: Education: Goal: Will demonstrate proper wound care and an understanding of methods to prevent future damage Outcome: Progressing Goal: Knowledge of disease or condition will improve Outcome: Progressing Goal: Knowledge of the prescribed therapeutic regimen will improve Outcome: Progressing Goal: Individualized Educational Video(s) Outcome: Progressing   Problem: Activity: Goal: Risk for activity intolerance will decrease Outcome: Progressing   Problem: Cardiac: Goal: Will achieve and/or maintain hemodynamic stability Outcome: Progressing   Problem: Clinical Measurements: Goal: Postoperative complications will be avoided or minimized Outcome: Progressing   Problem: Respiratory: Goal: Respiratory status will improve Outcome: Progressing   Problem: Skin Integrity: Goal: Wound healing without signs and symptoms of infection Outcome: Progressing Goal: Risk for impaired skin integrity will decrease Outcome: Progressing   Problem: Urinary Elimination: Goal: Ability to achieve and maintain adequate renal perfusion and functioning will improve Outcome: Progressing

## 2018-03-29 NOTE — Progress Notes (Signed)
CARDIAC REHAB PHASE I   PRE:  Rate/Rhythm: 89 SR    BP: sitting 115/78    SaO2: 95 RA  MODE:  Ambulation: 430 ft   POST:  Rate/Rhythm: 103 ST    BP: sitting 116/72     SaO2: 90 RA, 95 RA  Pt able to get out of bed independently and walk with light assist. Steady most of walk, had one bout of swaying off balance. Sts he is tired this pm. Return to bed. Will f/u in am for education.  [place], ACSM 03/29/2018 2:55 PM

## 2018-03-29 NOTE — Progress Notes (Addendum)
[address]       [employer] [id number]             [phone]      7 Days Post-Op Procedure(s) (LRB): CORONARY ARTERY BYPASS GRAFTING (CABG) x 3 WITH ENDOSCOPIC HARVESTING OF RIGHT GREATER SAPHENOUS VEIN (N/A) TRANSESOPHAGEAL ECHOCARDIOGRAM (TEE) (N/A) LEFT ATRIAL APPENDAGE OCCLUSION USING 40 MM ATRICURE ATRICLIP (N/A) Subjective: C/o chest soreness, ambulation conts to improve, denies DOE  Objective: Vital signs in last 24 hours: Temp:  [98 F (36.7 C)-98.4 F (36.9 C)] 98.1 F (36.7 C) (10/01 0414) Pulse Rate:  [86-101] 89 (10/01 0414) Cardiac Rhythm: Normal sinus rhythm (10/01 0700) Resp:  [18-31] 21 (10/01 0414) BP: (103-118)/(62-98) 118/62 (10/01 0414) SpO2:  [90 %-99 %] 96 % (10/01 0414) Weight:  [92.6 kg] 92.6 kg (10/01 0414)  Hemodynamic parameters for last 24 hours: CVP:  [7 mmHg-8 mmHg] 7 mmHg  Intake/Output from previous day: 09/30 0701 - 10/01 0700 In: 263.6 [P.O.:240; I.V.:23.6] Out: 1075 [Urine:1075] Intake/Output this shift: No intake/output data recorded.  General appearance: alert, cooperative and no distress Heart: regular rate and rhythm Lungs: coarse BS Abdomen:  benign Extremities: minor LE edema Wound: incis healing well  Lab Results: Recent Labs    03/28/18 0438 03/29/18 0451  WBC 9.6 12.1*  HGB 9.8* 10.5*  HCT 30.4* 32.4*  PLT 307 382   BMET:  Recent Labs    03/28/18 0438 03/29/18 0451  NA 134* 134*  K 3.8 4.1  CL 97* 98  CO2 27 28  GLUCOSE 163* 102*  BUN 31* 29*  CREATININE 1.34* 1.49*  CALCIUM 8.9 9.0    PT/INR: No results for input(s): LABPROT, INR in the last 72 hours. ABG    Component Value Date/Time   PHART 7.408 03/23/2018 0642   HCO3 21.9 03/23/2018 0642   TCO2 21 (L) 03/23/2018 1736   ACIDBASEDEF 2.0 03/23/2018 0642   O2SAT 49.4 03/29/2018 0420   CBG (last 3)  Recent Labs    03/28/18 1205 03/28/18 1715 03/28/18 2111  GLUCAP 93 121* 131*    Meds Scheduled Meds: . amiodarone   200 mg Oral BID  . aspirin EC  325 mg Oral Daily   Or  . aspirin  324 mg Per Tube Daily  . atorvastatin  80 mg Oral q1800  . bisacodyl  10 mg Oral Daily   Or  . bisacodyl  10 mg Rectal Daily  . Chlorhexidine Gluconate Cloth  6 each Topical Daily  . docusate sodium  200 mg Oral Daily  . enoxaparin (LOVENOX) injection  40 mg Subcutaneous QHS  . furosemide  40 mg Oral Daily  . insulin aspart  0-24 Units Subcutaneous TID WC & HS  . insulin detemir  10 Units Subcutaneous Daily  . mupirocin cream   Topical BID  . pantoprazole  40 mg Oral Daily  . potassium chloride  20 mEq Oral BID  . sodium chloride flush  10-40 mL Intracatheter Q12H  . sodium chloride flush  10-40 mL Intracatheter Q12H  . sodium chloride flush  3 mL Intravenous Q12H  . spironolactone  12.5 mg Oral Daily  . traZODone  50 mg Oral QHS   Continuous Infusions: . sodium chloride Stopped (03/26/18 1146)   PRN Meds:.sodium chloride, ondansetron (ZOFRAN) IV, oxyCODONE, sodium chloride flush, sodium chloride flush, sodium chloride flush, traMADol  Xrays No results found.  Assessment/Plan: S/P Procedure(s) (LRB): CORONARY ARTERY BYPASS GRAFTING (CABG) x 3 WITH ENDOSCOPIC HARVESTING OF RIGHT  GREATER SAPHENOUS VEIN (N/A) TRANSESOPHAGEAL ECHOCARDIOGRAM (TEE) (N/A) LEFT ATRIAL APPENDAGE OCCLUSION USING 40 MM ATRICURE ATRICLIP (N/A)  1 overall rehab progress is good 2 hemodyn stable, COOX a littlle lower off milrinone at 49, cont to remain in sinus rhythm 3 some volume overload, creat is increased, AHF team assisting with management of heart failure 4 increased leukocytosis, some pulm congestion, no fevers, poss early bronchitis, no sputum production 5 H/H stable 6 sugars well controlled- d/c insulin and monitor 7 cont pulm toilet and rehab  LOS: 13 days    David Mccullough 03/29/2018 Pager [phone] Maintaining sinus rhythm and improving overall functional status Milrinone weaned off.  Observe patient for 24 hours off  milrinone and possibly DC home tomorrow if he remains stable.  patient examined and medical record reviewed,agree with above note. [name] 03/29/2018

## 2018-03-29 NOTE — Progress Notes (Addendum)
Patient ID: David Mccullough, male   DOB: 1945/10/05, 72 y.o.   MRN: [id number]    Advanced Heart Failure Rounding Note   Subjective:    Milrinone stopped yesterday. Coox 49%. Recheck. Sluggish UOP with 80 mg PO lasix. Weight down 4 lbs (21 lbs total). Creatinine 1.34 > 1.49. CVP not hooked up, but RN got 7-8 overnight  WBC 9.6 > 12.1. Afebrile. Epicardial wires pulled yesterday.  Walked in hallways with no CP or SOB. Feels good this morning. No bleeding. He has a dry cough. No fever or chills.  Objective:   Weight Range:  Vital Signs:   Temp:  [98 F (36.7 C)-98.4 F (36.9 C)] 98.1 F (36.7 C) (10/01 0414) Pulse Rate:  [86-101] 89 (10/01 0414) Resp:  [18-31] 21 (10/01 0414) BP: (103-118)/(62-98) 118/62 (10/01 0414) SpO2:  [90 %-99 %] 96 % (10/01 0414) Weight:  [92.6 kg] 92.6 kg (10/01 0414) Last BM Date: 03/28/18  Weight change: Filed Weights   03/27/18 0600 03/28/18 0441 03/29/18 0414  Weight: 95.1 kg 94.6 kg 92.6 kg    Intake/Output:   Intake/Output Summary (Last 24 hours) at 03/29/2018 0716 Last data filed at 03/29/2018 0500 Gross per 24 hour  Intake 263.56 ml  Output 1075 ml  Net -811.44 ml     Physical Exam: General: No resp difficulty. HEENT: Normal Neck: Supple. JVP ~7. Carotids 2+ bilat; no bruits. No thyromegaly or nodule noted. Cor: PMI nondisplaced. RRR, No M/G/R noted. Midsternal incision CDI. Lungs: Clear/diminished throughout. Abdomen: Soft, non-tender, non-distended, no HSM. No bruits or masses. +BS  Extremities: No cyanosis, clubbing, or rash. R and LLE no edema.  Neuro: Alert & orientedx3, cranial nerves grossly intact. moves all 4 extremities w/o difficulty. Affect pleasant  Telemetry: NSR 90s. Personally reviewed.    Labs: Basic Metabolic Panel: Recent Labs  Lab 03/23/18 0300 03/23/18 1731  03/24/18 1159  03/25/18 1701 03/26/18 0433 03/26/18 1940 03/27/18 0422 03/28/18 0436 03/28/18 0438 03/29/18 0451  NA 134*  --    < > 135   <  > 133* 133* 133* 135  --  134* 134*  K 4.3  --    < > 4.1   < > 3.7 3.0* 3.4* 3.8  --  3.8 4.1  CL 104  --    < > 100   < > 98 98  --  96*  --  97* 98  CO2 22  --    < > 23   < > 23 26  --  29  --  27 28  GLUCOSE 114*  --    < > 181*   < > 122* 129* 133* 111*  --  163* 102*  BUN 16  --    < > 27*   < > 36* 34*  --  36*  --  31* 29*  CREATININE 0.95 1.17   < > 1.42*   < > 1.54* 1.40*  --  1.56*  --  1.34* 1.49*  CALCIUM 8.6*  --    < > 9.0   < > 9.0 8.6*  --  9.1  --  8.9 9.0  MG 2.5* 2.1  --  2.3  --   --   --   --   --  1.8  --  2.0   < > = values in this interval not displayed.    Liver Function Tests: Recent Labs  Lab 03/25/18 0416 03/26/18 0433 03/27/18 0422 03/28/18 0438 03/29/18 0451  AST 25 22  27 24 29   ALT 32 28 31 29  32  ALKPHOS 56 60 68 69 77  BILITOT 0.7 0.7 0.7 0.8 0.7  PROT 5.3* 5.0* 5.5* 5.4* 5.7*  ALBUMIN 2.5* 2.4* 2.5* 2.5* 2.6*   No results for input(s): LIPASE, AMYLASE in the last 168 hours. No results for input(s): AMMONIA in the last 168 hours.  CBC: Recent Labs  Lab 03/25/18 0416 03/26/18 0433 03/26/18 1940 03/27/18 0422 03/28/18 0438 03/29/18 0451  WBC 10.0 9.8  --  10.2 9.6 12.1*  HGB 9.0* 9.0* 8.8* 9.7* 9.8* 10.5*  HCT 27.4* 27.5* 26.0* 29.8* 30.4* 32.4*  MCV 88.4 87.3  --  87.4 87.4 87.8  PLT 205 231  --  297 307 382    Cardiac Enzymes: No results for input(s): CKTOTAL, CKMB, CKMBINDEX, TROPONINI in the last 168 hours.  BNP: BNP (last 3 results) Recent Labs    03/14/18 0936  BNP 180.0*    ProBNP (last 3 results) No results for input(s): PROBNP in the last 8760 hours.    Other results:  Imaging: No results found.   Medications:     Scheduled Medications: . amiodarone  200 mg Oral BID  . aspirin EC  325 mg Oral Daily   Or  . aspirin  324 mg Per Tube Daily  . atorvastatin  80 mg Oral q1800  . bisacodyl  10 mg Oral Daily   Or  . bisacodyl  10 mg Rectal Daily  . Chlorhexidine Gluconate Cloth  6 each Topical Daily  .  docusate sodium  200 mg Oral Daily  . enoxaparin (LOVENOX) injection  40 mg Subcutaneous QHS  . furosemide  40 mg Oral Daily  . insulin aspart  0-24 Units Subcutaneous TID WC & HS  . insulin detemir  10 Units Subcutaneous Daily  . mupirocin cream   Topical BID  . pantoprazole  40 mg Oral Daily  . potassium chloride  20 mEq Oral BID  . sodium chloride flush  10-40 mL Intracatheter Q12H  . sodium chloride flush  10-40 mL Intracatheter Q12H  . sodium chloride flush  3 mL Intravenous Q12H  . spironolactone  12.5 mg Oral Daily  . traZODone  50 mg Oral QHS    Infusions: . sodium chloride Stopped (03/26/18 1146)    PRN Medications: sodium chloride, ondansetron (ZOFRAN) IV, oxyCODONE, sodium chloride flush, sodium chloride flush, sodium chloride flush, traMADol   Assessment:    Mr Fosberg is a 72 year old with a history of SVT ablation 10 years ago in [place], A fib, NSTEMI, multivessel CAD with unsuccessful PCI and CABG.    Plan/Discussion:    1.CAD s/p CABG x3  9/24 - CAD/NSTEMI. POD #7 - No s/s ischemia. Continue ASA/statin - continue to ambulate - Epicardial wires now out.  2. Acute diastolic HF - ECHO 7/26  LVEF normal. Limted ECHO on 9/26 showed LVEF 55-60% with normal RV (echo reviewed and EF probably more in the 45-50% range).  - Milrinone stopped 9/30. Coox 49% this morning. Recheck.  - Continue Lasix 40 mg po daily (received 80 mg x1 yesterday). CVP 7-8 on RN check overnight - Continue spiro 12.5 mg daily.  - Off carvedilol for now.  - Consider adding low dose losartan, but creatinine 1.34 > 1.49 today.  3. PAF - On admit. Remains in NSR. - Continue amiodarone 200 mg bid (decreased 9/29).  - No AC if remains in NSR   4. Anemia  Expected blood loss.   Hgb stable 10.5  Follow CBCs.   5. [place]  - 9/26. Started lidocaine drip. - Lidocaine stopped 9/27 - Keep K >= 4.0 Mg >= 2.0. K 3.8. K 4.1, mag 2.0 - Remains on amiodarone 200 mg BID.   6.  CKD Stage III - creatinine 1.56 > 1.34 > 1.49. (baseline ~1.1)  7. DM2 - consider Jardiance on d/c. No change.  Medication concerns reviewed with patient and pharmacy team. Barriers identified: n/a    Length of Stay: [place] NP 03/29/2018, 7:16 AM  Advanced Heart Failure Team Pager [phone] (M-F; [place])  Please contact [facility] for night-coverage after hours (4p -7a ) and weekends on [email or web address]  Patient seen and examined with the above-signed Advanced Practice Provider and/or Housestaff. I personally reviewed laboratory data, imaging studies and relevant notes. I independently examined the patient and formulated the important aspects of the plan. I have edited the note to reflect any of my changes or salient points. I have personally discussed the plan with the patient and/or family.  Overall doing well. Weight back to baseline. Walking halls without difficulty. No s3 on exam. Co-ox mildly depressed. Will continue to follow. If co-ox > 50% tomorrow should be stable for d/c.   [name], MD  11:25 AM

## 2018-03-30 LAB — URINALYSIS, ROUTINE W REFLEX MICROSCOPIC
Bacteria, UA: NONE SEEN
Bilirubin Urine: NEGATIVE
Glucose, UA: NEGATIVE mg/dL
Ketones, ur: NEGATIVE mg/dL
Leukocytes, UA: NEGATIVE
Nitrite: NEGATIVE
Protein, ur: NEGATIVE mg/dL
Specific Gravity, Urine: 1.015 (ref 1.005–1.030)
pH: 6 (ref 5.0–8.0)

## 2018-03-30 LAB — COMPREHENSIVE METABOLIC PANEL
ALBUMIN: 2.7 g/dL — AB (ref 3.5–5.0)
ALT: 42 U/L (ref 0–44)
ANION GAP: 11 (ref 5–15)
AST: 39 U/L (ref 15–41)
Alkaline Phosphatase: 73 U/L (ref 38–126)
BILIRUBIN TOTAL: 0.8 mg/dL (ref 0.3–1.2)
BUN: 28 mg/dL — ABNORMAL HIGH (ref 8–23)
CO2: 26 mmol/L (ref 22–32)
Calcium: 9.3 mg/dL (ref 8.9–10.3)
Chloride: 97 mmol/L — ABNORMAL LOW (ref 98–111)
Creatinine, Ser: 1.42 mg/dL — ABNORMAL HIGH (ref 0.61–1.24)
GFR, EST AFRICAN AMERICAN: 55 mL/min — AB (ref >60)
GFR, EST NON AFRICAN AMERICAN: 47 mL/min — AB (ref >60)
Glucose, Bld: 107 mg/dL — ABNORMAL HIGH (ref 70–99)
POTASSIUM: 4.3 mmol/L (ref 3.5–5.1)
Sodium: 134 mmol/L — ABNORMAL LOW (ref 135–145)
Total Protein: 5.7 g/dL — ABNORMAL LOW (ref 6.5–8.1)

## 2018-03-30 LAB — CBC
HEMATOCRIT: 32.9 % — AB (ref 39.0–52.0)
HEMOGLOBIN: 10.6 g/dL — AB (ref 13.0–17.0)
MCH: 28.3 pg (ref 26.0–34.0)
MCHC: 32.2 g/dL (ref 30.0–36.0)
MCV: 87.7 fL (ref 78.0–100.0)
Platelets: 414 10*3/uL — ABNORMAL HIGH (ref 150–400)
RBC: 3.75 MIL/uL — ABNORMAL LOW (ref 4.22–5.81)
RDW: 12.6 % (ref 11.5–15.5)
WBC: 13.7 10*3/uL — AB (ref 4.0–10.5)

## 2018-03-30 LAB — COOXEMETRY PANEL
Carboxyhemoglobin: 1.7 % — ABNORMAL HIGH (ref 0.5–1.5)
Methemoglobin: 1 % (ref 0.0–1.5)
O2 Saturation: 51.2 %
Total hemoglobin: 11.1 g/dL — ABNORMAL LOW (ref 12.0–16.0)

## 2018-03-30 LAB — GLUCOSE, CAPILLARY
Glucose-Capillary: 146 mg/dL — ABNORMAL HIGH (ref 70–99)
Glucose-Capillary: 112 mg/dL — ABNORMAL HIGH (ref 70–99)

## 2018-03-30 LAB — MAGNESIUM: Magnesium: 1.9 mg/dL (ref 1.7–2.4)

## 2018-03-30 MED ORDER — CEPHALEXIN 500 MG PO CAPS
500.0000 mg | ORAL_CAPSULE | Freq: Two times a day (BID) | ORAL | Status: DC
  Filled 2018-03-30: qty 1

## 2018-03-30 MED ORDER — CEPHALEXIN 500 MG PO CAPS: 500.0000 mg | ORAL_CAPSULE | Freq: Two times a day (BID) | ORAL | 0 refills | Status: DC

## 2018-03-30 MED ORDER — TRAMADOL HCL 50 MG PO TABS: 50.0000 mg | ORAL_TABLET | Freq: Four times a day (QID) | ORAL | 0 refills | Status: AC | PRN

## 2018-03-30 MED ORDER — POTASSIUM CHLORIDE CRYS ER 20 MEQ PO TBCR: 20.0000 meq | EXTENDED_RELEASE_TABLET | Freq: Every day | ORAL | 0 refills | Status: DC

## 2018-03-30 MED ORDER — SPIRONOLACTONE 25 MG PO TABS: 12.5000 mg | ORAL_TABLET | Freq: Every day | ORAL | 0 refills | Status: DC

## 2018-03-30 MED ORDER — FUROSEMIDE 40 MG PO TABS: 40.0000 mg | ORAL_TABLET | Freq: Every day | ORAL | 0 refills | Status: DC | PRN

## 2018-03-30 MED ORDER — ATORVASTATIN CALCIUM 80 MG PO TABS: 80.0000 mg | ORAL_TABLET | Freq: Every day | ORAL | 1 refills | Status: DC

## 2018-03-30 MED ORDER — ASPIRIN 325 MG PO TBEC: 325.0000 mg | DELAYED_RELEASE_TABLET | Freq: Every day | ORAL | Status: DC

## 2018-03-30 MED ORDER — POTASSIUM CHLORIDE CRYS ER 20 MEQ PO TBCR: 20.0000 meq | EXTENDED_RELEASE_TABLET | Freq: Every day | ORAL | 0 refills | Status: DC | PRN

## 2018-03-30 MED ORDER — FUROSEMIDE 40 MG PO TABS: 40.0000 mg | ORAL_TABLET | Freq: Every day | ORAL | 0 refills | Status: DC

## 2018-03-30 MED ORDER — AMIODARONE HCL 200 MG PO TABS: 200.0000 mg | ORAL_TABLET | Freq: Two times a day (BID) | ORAL | 1 refills | Status: DC

## 2018-03-30 NOTE — Progress Notes (Addendum)
Patient ID: David Mccullough, male   DOB: 1946/02/05, 72 y.o.   MRN: [id number]    Advanced Heart Failure Rounding Note   Subjective:    Milrinone stopped 9/30. Coox 51%. Improved UOP yesterday on 40 mg PO lasix. Weight down another 3 lbs (24 lbs total). Creatinine 1.34 > 1.49 -> 1.42. CVP not hooked up, but charted 7-12 overnight.  WBC 9.6 > 12.1 > 13.7. Afebrile.   Feels great. Walked in halls yesterday with no problems. No fever or chills. Still has occasional productive cough (unsure what color sputum). Using IS every hour. Hopes to go home today.   Objective:   Weight Range:  Vital Signs:   Temp:  [97.9 F (36.6 C)-98.2 F (36.8 C)] 97.9 F (36.6 C) (10/02 0434) Pulse Rate:  [88-90] 88 (10/02 0434) Resp:  [16-31] 19 (10/02 0434) BP: (102-113)/(61-73) 113/66 (10/02 0434) SpO2:  [92 %-96 %] 96 % (10/02 0434) Weight:  [91.2 kg] 91.2 kg (10/02 0500) Last BM Date: 03/29/18  Weight change: Filed Weights   03/28/18 0441 03/29/18 0414 03/30/18 0500  Weight: 94.6 kg 92.6 kg 91.2 kg    Intake/Output:   Intake/Output Summary (Last 24 hours) at 03/30/2018 0733 Last data filed at 03/30/2018 0654 Gross per 24 hour  Intake 253 ml  Output 1900 ml  Net -1647 ml     Physical Exam: General: No resp difficulty. HEENT: Normal Neck: Supple. JVP 7-8. Carotids 2+ bilat; no bruits. No thyromegaly or nodule noted. Cor: PMI nondisplaced. RRR, No M/G/R noted. Midsternal incision CDI Lungs: clear, diminished in bases.  Abdomen: Soft, non-tender, non-distended, no HSM. No bruits or masses. +BS  Extremities: No cyanosis, clubbing, or rash. R and LLE no edema.  Neuro: Alert & orientedx3, cranial nerves grossly intact. moves all 4 extremities w/o difficulty. Affect pleasant  Telemetry: NSR 70-80s. Personally reviewed.    Labs: Basic Metabolic Panel: Recent Labs  Lab 03/23/18 1731  03/24/18 1159  03/26/18 0433 03/26/18 1940 03/27/18 0422 03/28/18 0436 03/28/18 0438 03/29/18 0451  03/30/18 0430  NA  --    < > 135   < > 133* 133* 135  --  134* 134* 134*  K  --    < > 4.1   < > 3.0* 3.4* 3.8  --  3.8 4.1 4.3  CL  --    < > 100   < > 98  --  96*  --  97* 98 97*  CO2  --    < > 23   < > 26  --  29  --  27 28 26   GLUCOSE  --    < > 181*   < > 129* 133* 111*  --  163* 102* 107*  BUN  --    < > 27*   < > 34*  --  36*  --  31* 29* 28*  CREATININE 1.17   < > 1.42*   < > 1.40*  --  1.56*  --  1.34* 1.49* 1.42*  CALCIUM  --    < > 9.0   < > 8.6*  --  9.1  --  8.9 9.0 9.3  MG 2.1  --  2.3  --   --   --   --  1.8  --  2.0  --    < > = values in this interval not displayed.    Liver Function Tests: Recent Labs  Lab 03/26/18 0433 03/27/18 0422 03/28/18 0438 03/29/18 0451 03/30/18 0430  AST  22 27 24 29  39  ALT 28 31 29  32 42  ALKPHOS 60 68 69 77 73  BILITOT 0.7 0.7 0.8 0.7 0.8  PROT 5.0* 5.5* 5.4* 5.7* 5.7*  ALBUMIN 2.4* 2.5* 2.5* 2.6* 2.7*   No results for input(s): LIPASE, AMYLASE in the last 168 hours. No results for input(s): AMMONIA in the last 168 hours.  CBC: Recent Labs  Lab 03/26/18 0433 03/26/18 1940 03/27/18 0422 03/28/18 0438 03/29/18 0451 03/30/18 0430  WBC 9.8  --  10.2 9.6 12.1* 13.7*  HGB 9.0* 8.8* 9.7* 9.8* 10.5* 10.6*  HCT 27.5* 26.0* 29.8* 30.4* 32.4* 32.9*  MCV 87.3  --  87.4 87.4 87.8 87.7  PLT 231  --  297 307 382 414*    Cardiac Enzymes: No results for input(s): CKTOTAL, CKMB, CKMBINDEX, TROPONINI in the last 168 hours.  BNP: BNP (last 3 results) Recent Labs    03/14/18 0936  BNP 180.0*    ProBNP (last 3 results) No results for input(s): PROBNP in the last 8760 hours.    Other results:  Imaging: No results found.   Medications:     Scheduled Medications: . amiodarone  200 mg Oral BID  . aspirin EC  325 mg Oral Daily   Or  . aspirin  324 mg Per Tube Daily  . atorvastatin  80 mg Oral q1800  . bisacodyl  10 mg Oral Daily   Or  . bisacodyl  10 mg Rectal Daily  . Chlorhexidine Gluconate Cloth  6 each Topical  Daily  . docusate sodium  200 mg Oral Daily  . enoxaparin (LOVENOX) injection  40 mg Subcutaneous QHS  . furosemide  40 mg Oral Daily  . insulin aspart  0-24 Units Subcutaneous TID WC & HS  . mupirocin cream   Topical BID  . pantoprazole  40 mg Oral Daily  . potassium chloride  20 mEq Oral BID  . sodium chloride flush  10-40 mL Intracatheter Q12H  . sodium chloride flush  10-40 mL Intracatheter Q12H  . sodium chloride flush  3 mL Intravenous Q12H  . spironolactone  12.5 mg Oral Daily  . traZODone  50 mg Oral QHS    Infusions: . 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    Plan/Discussion:    1.CAD s/p CABG x3  9/24 - CAD/NSTEMI. POD #8 - No s/s ischemia. Continue ASA/statin - continue to ambulate - Epicardial wires now out.  2. Acute diastolic HF - ECHO 1/51  LVEF normal. Limted ECHO on 9/26 showed LVEF 55-60% with normal RV (echo reviewed and EF probably more in the 45-50% range).  - Milrinone stopped 9/30. Coox 51% today.  - Continue Lasix 40 mg po daily. - Continue spiro 12.5 mg daily. SBP 100-110s - Off carvedilol for now.   3. PAF - On admit. Remains in NSR. - Continue amiodarone 200 mg bid (decreased 9/29).  - No AC if remains in NSR   4. Anemia  Expected blood loss.   Hgb stable 10.6 Follow CBCs.   5. [place]  - 9/26. Started lidocaine drip. - Lidocaine stopped 9/27 - Keep K >= 4.0 Mg >= 2.0. K 3.8. K 4.3, mag  2.0 yesterday - Remains on amiodarone 200 mg BID.   6. CKD Stage III - creatinine 1.56 > 1.34 > 1.49 > 1.42. (baseline ~1.1)  7. DM2 - consider Jardiance on d/c. No change.   Medication concerns reviewed with  patient and pharmacy team. Barriers identified: n/a   Possible DC today. I have arranged HF follow up.  Length of Stay: [place] NP 03/30/2018, 7:33 AM  Advanced Heart Failure Team Pager [phone] (M-F; 7a - 4p)  Please contact [facility] for night-coverage after hours (4p -7a ) and weekends on [email or web address]   Patient seen and examined with the above-signed Advanced Practice Provider and/or Housestaff. I personally reviewed laboratory data, imaging studies and relevant notes. I independently examined the patient and formulated the important aspects of the plan. I have edited the note to reflect any of my changes or salient points. I have personally discussed the plan with the patient and/or family.  Looks great from cardiac perspective. Co-ox still marginal but asymptomatic.WBC rising and will defer to TCTS if he needs brief course of oral abx.  Cardiac meds on d/c  ECASA 81 Atorva 80 Spiro 12.5 Jardiance 10mg  daily  Lasix 40mg  daily as needed for weight gain.   We will arrange f/u in HF Clinic.   [name], MD  9:07 AM

## 2018-03-30 NOTE — Care Management (Signed)
#    1.   S/ W YVETTE  @ [facility] # [phone]  JARDIANCE  10 MG DAILY COVER-YES CO-PAY- $ 28.00 TIER-2 DRUG PRIOR APPROVAL- YES # [phone] FOR EXCEPTION  PREFERRED PHARMACY : YES [facility], [facility]  AND [facility]

## 2018-03-30 NOTE — Progress Notes (Signed)
CARDIAC REHAB PHASE I   Ed completed with pt and wife. Good reception. Gave HF booklet and discussed low sodium and daily wts as well. Will refer to [place].  [id number]  [place], ACSM 03/30/2018 11:13 AM

## 2018-03-30 NOTE — Progress Notes (Signed)
Physical Therapy Treatment Patient Details Name: David Mccullough MRN: [id number] DOB: [birth year] Today's Date: 03/30/2018    History of Present Illness 72 yo male with SVT s/p CABG x3 9/24. Pt admitted [facility] with [facility]I, [facility] Pt with cardiac cath 9/17. PMHx A-fib, CAD, tachycardia.      PT Comments    Patient progressing with therapy, recalling precautions and ambulating further distances. Additionally, pt no longer with posterior LOB noted during walking, supervision without AD at this point. Pt denies pain, VSS on RA.    Follow Up Recommendations  Home health PT     Equipment Recommendations  Rolling walker with 5" wheels;3in1 (PT)    Recommendations for Other Services       Precautions / Restrictions Precautions Precautions: Sternal;Fall Precaution Booklet Issued: Yes (comment) Precaution Comments: precautions reviewed with pt able to recall "no pushing, no lifting"  Restrictions Weight Bearing Restrictions: Yes    Mobility  Bed Mobility Overal bed mobility: Modified Independent                Transfers Overall transfer level: Modified independent               General transfer comment: no ad, no assistance  Ambulation/Gait Ambulation/Gait assistance: Supervision Gait Distance (Feet): 500 Feet Assistive device: None Gait Pattern/deviations: Step-through pattern Gait velocity: decreased   General Gait Details: pt with improving gait mechanics and stability, no LOB this visit   Stairs             Wheelchair Mobility    Modified Rankin (Stroke Patients Only)       Balance Overall balance assessment: Needs assistance   Sitting balance-Leahy Scale: Normal       Standing balance-Leahy Scale: Fair                              Cognition Arousal/Alertness: Awake/alert Behavior During Therapy: WFL for tasks assessed/performed Overall Cognitive Status: Within Functional Limits for tasks assessed                                  General Comments: increased time for processing.       Exercises      General Comments        Pertinent Vitals/Pain Pain Assessment: No/denies pain    Home Living                      Prior Function            PT Goals (current goals can now be found in the care plan section) Acute Rehab PT Goals PT Goal Formulation: With patient Time For Goal Achievement: 04/08/18 Potential to Achieve Goals: Fair Progress towards PT goals: Progressing toward goals    Frequency    Min 3X/week      PT Plan Current plan remains appropriate    Co-evaluation              AM-PAC PT "6 Clicks" Daily Activity  Outcome Measure  Difficulty turning over in bed (including adjusting bedclothes, sheets and blankets)?: A Lot Difficulty moving from lying on back to sitting on the side of the bed? : A Lot Difficulty sitting down on and standing up from a chair with arms (e.g., wheelchair, bedside commode, etc,.)?: A Little Help needed moving to and from a bed to chair (including  a wheelchair)?: A Little Help needed walking in hospital room?: A Little Help needed climbing 3-5 steps with a railing? : A Lot 6 Click Score: 15    End of Session Equipment Utilized During Treatment: Gait belt Activity Tolerance: Patient tolerated treatment well Patient left: in chair;with call bell/phone within reach Nurse Communication: Mobility status PT Visit Diagnosis: Other abnormalities of gait and mobility (R26.89);Unsteadiness on feet (R26.81)     Time: 3888-2800 PT Time Calculation (min) (ACUTE ONLY): 21 min  Charges:  $Gait Training: 8-22 mins                     Reinaldo Berber, PT, DPT Acute Rehabilitation Services Pager: [phone] Office: [phone]     Reinaldo Berber 03/30/2018, 1:30 PM

## 2018-03-30 NOTE — Care Management Note (Addendum)
Case Management Note Previous Note Created by Midge Minium  Patient Details  Name: David Mccullough MRN: [id number] Date of Birth: 1945/12/21  Subjective/Objective: 72 year old presented with chest pain; s/p CABG.               Action/Plan: CM met with patient to discuss transitional needs. Patient lives at home with spouse, independent with ADLs, with no DME in use. Patient reported no PCP established since relocating to [facility], with spouse in process of scheduling an appointment. Patient agreeable to CM contacting spouse for PCP information, but spouse is unavailable today d/t a scheduled outpatient procedure. CM discussed PT recommendations for HHPT, with patient agreeable, but CM unable to schedule without patient having an established PCP. Patient indicated spouse could provide transportation home. CM will continue to follow for additional options as patient progress.  Expected Discharge Date:  03/30/18               Expected Discharge Plan:  [place]  In-House Referral:  NA  Discharge planning Services  CM Consult  Post Acute Care Choice:  Home Health Choice offered to:  Patient  DME Arranged:  N/A DME Agency:  NA  HH Arranged:  PT [place] Agency:  [facility]  Status of Service:  In process, will continue to follow  If discussed at Long Length of Stay Meetings, dates discussed:    Additional Comments: 03/30/2018  CM received consult for Jardiance - CM submitted benefit check - results documented in Epic and Cardiothoracic PA informed that prior auth is required.  PA informed CM that consult was placed in error and that pt will not discharge home on medication.    Pt to discharge home today in care of wife, wife will provide 24/7 supervision at discharge.  Pt declined DME equipment recommended.  Pt aware that HHPT can not be arranged at this time as stated above.  Pt declined for CM to assist with locating a PCP within his network - pt informed CM that he  already has an initial appt scheduled with a local PCP.  Pt ambulating independently around unit prior to discharge.  Jardiance Benefit check submitted 03/30/2018, 10:14 AM

## 2018-03-30 NOTE — Progress Notes (Signed)
PICC line removed 09:22 by Dr. Jacinto Reap.

## 2018-03-30 NOTE — Progress Notes (Addendum)
[address] GroveSuite [address]       [place] [address]             [phone]      8 Days Post-Op Procedure(s) (LRB): CORONARY ARTERY BYPASS GRAFTING (CABG) x 3 WITH ENDOSCOPIC HARVESTING OF RIGHT GREATER SAPHENOUS VEIN (N/A) TRANSESOPHAGEAL ECHOCARDIOGRAM (TEE) (N/A) LEFT ATRIAL APPENDAGE OCCLUSION USING 40 MM ATRICURE ATRICLIP (N/A) Subjective: feels well, no new c/o  Objective: Vital signs in last 24 hours: Temp:  [97.9 F (36.6 C)-98.2 F (36.8 C)] 97.9 F (36.6 C) (10/02 0434) Pulse Rate:  [88-90] 88 (10/02 0434) Cardiac Rhythm: Normal sinus rhythm (10/02 0434) Resp:  [16-31] 19 (10/02 0434) BP: (102-113)/(61-73) 113/66 (10/02 0434) SpO2:  [92 %-96 %] 96 % (10/02 0434) Weight:  [91.2 kg] 91.2 kg (10/02 0500)  Hemodynamic parameters for last 24 hours: CVP:  [7 mmHg-12 mmHg] 10 mmHg  Intake/Output from previous day: 10/01 0701 - 10/02 0700 In: 253 [P.O.:240; I.V.:13] Out: 1900 [Urine:1900] Intake/Output this shift: No intake/output data recorded.  General appearance: alert, cooperative and no distress Heart: regular rate and rhythm Lungs: coarse in upper airways, improves with cough Abdomen: benign Extremities: no edema Wound: incis healing well  Lab Results: Recent Labs    03/29/18 0451 03/30/18 0430  WBC 12.1* 13.7*  HGB 10.5* 10.6*  HCT 32.4* 32.9*  PLT 382 414*   BMET:  Recent Labs    03/29/18 0451 03/30/18 0430  NA 134* 134*  K 4.1 4.3  CL 98 97*  CO2 28 26  GLUCOSE 102* 107*  BUN 29* 28*  CREATININE 1.49* 1.42*  CALCIUM 9.0 9.3    PT/INR: No results for input(s): LABPROT, INR in the last 72 hours. ABG    Component Value Date/Time   PHART 7.408 03/23/2018 0642   HCO3 21.9 03/23/2018 0642   TCO2 21 (L) 03/23/2018 1736   ACIDBASEDEF 2.0 03/23/2018 0642   O2SAT 51.2 03/30/2018 0444   CBG (last 3)  Recent Labs    03/29/18 0752 03/29/18 1210 03/29/18 2127  GLUCAP 112* 131* 131*    Meds Scheduled Meds: . amiodarone   200 mg Oral BID  . aspirin EC  325 mg Oral Daily   Or  . aspirin  324 mg Per Tube Daily  . atorvastatin  80 mg Oral q1800  . bisacodyl  10 mg Oral Daily   Or  . bisacodyl  10 mg Rectal Daily  . Chlorhexidine Gluconate Cloth  6 each Topical Daily  . docusate sodium  200 mg Oral Daily  . enoxaparin (LOVENOX) injection  40 mg Subcutaneous QHS  . furosemide  40 mg Oral Daily  . insulin aspart  0-24 Units Subcutaneous TID WC & HS  . mupirocin cream   Topical BID  . pantoprazole  40 mg Oral Daily  . potassium chloride  20 mEq Oral BID  . sodium chloride flush  10-40 mL Intracatheter Q12H  . sodium chloride flush  10-40 mL Intracatheter Q12H  . sodium chloride flush  3 mL Intravenous Q12H  . spironolactone  12.5 mg Oral Daily  . traZODone  50 mg Oral QHS   Continuous Infusions: . sodium chloride Stopped (03/26/18 1146)   PRN Meds:.sodium chloride, ondansetron (ZOFRAN) IV, oxyCODONE, sodium chloride flush, sodium chloride flush, sodium chloride flush, traMADol  Xrays No results found.  Assessment/Plan: S/P Procedure(s) (LRB): CORONARY ARTERY BYPASS GRAFTING (CABG) x 3 WITH ENDOSCOPIC HARVESTING OF RIGHT GREATER SAPHENOUS VEIN (N/A) TRANSESOPHAGEAL ECHOCARDIOGRAM (TEE) (N/A) LEFT ATRIAL  APPENDAGE OCCLUSION USING 40 MM ATRICURE ATRICLIP (N/A)  1 conts to do well 2 hemodyn stable in sinus, Coox 51 3 creat improved to 1.42 4 WBC 13, no fevers 5 appears stable for discharge      LOS: 14 days    John Giovanni 03/30/2018 Pager [phone]  Discharge instructions reviewed with patient Home on 5 days of Keflex 500 p.o. twice daily for probable bronchitis.  patient examined and medical record reviewed,agree with above note. [name] 03/30/2018

## 2018-04-05 ENCOUNTER — Encounter (HOSPITAL_COMMUNITY): Payer: Medicare Other | Admitting: Cardiology

## 2018-04-07 DIAGNOSIS — I480 Paroxysmal atrial fibrillation: Secondary | ICD-10-CM | POA: Diagnosis not present

## 2018-04-07 DIAGNOSIS — I2510 Atherosclerotic heart disease of native coronary artery without angina pectoris: Secondary | ICD-10-CM | POA: Diagnosis not present

## 2018-04-07 DIAGNOSIS — Z951 Presence of aortocoronary bypass graft: Secondary | ICD-10-CM | POA: Diagnosis not present

## 2018-04-07 DIAGNOSIS — E782 Mixed hyperlipidemia: Secondary | ICD-10-CM | POA: Diagnosis not present

## 2018-04-07 DIAGNOSIS — I214 Non-ST elevation (NSTEMI) myocardial infarction: Secondary | ICD-10-CM | POA: Diagnosis not present

## 2018-04-07 DIAGNOSIS — I10 Essential (primary) hypertension: Secondary | ICD-10-CM | POA: Diagnosis not present

## 2018-04-08 DIAGNOSIS — H2513 Age-related nuclear cataract, bilateral: Secondary | ICD-10-CM | POA: Diagnosis not present

## 2018-04-12 NOTE — Progress Notes (Signed)
Advanced Heart Failure Clinic Note   PCP: Patient, No Pcp Per PCP-Cardiologist: Dr Haroldine Laws  HPI:  David Mccullough is a 72 y.o. male with h/o CKD 3, PAF and CAD recently admitted with NSTEM. Had unsuccessful PCI and then underwent CABG 03/22/18. Post-op course c/b Torsades and persistent volume overload.  Admitted 9/16-10/07/2017 who presented to [facility] with CP and ruled in for NSTEMI. LHC showed 3v disease. PCI attempted, but was unsuccessful. Transferred to [facility] on 9/18 for CABG evaluation. Dr Prescott Gum performed CABG x 3 on 03/22/18. He had an episodes of torsades post op that resolved with lidocaine and magnesium. He had low co-ox and volume overload, so AHF team ws consulted. Milrinone was weaned as able and he was diuresed with IV lasix. Coox on day of DC was marginal at 51%. Transitioned to jardiance with lasix PRN at DC. He was in afib on admit and amiodarone was reloaded. Decreased to amio 200 mg BID on 9/29.  DC weight: 200 lbs.   He presents today for post hospital follow up with his wife. Overall doing really well. Walking about 1 mile per day with no CP or SOB. No problems with hills or steps. No orthopnea, PND, or edema. No dizziness or palpitations. No cough, fever, or chills. Appetite still poor. Energy level okay. Had some problems with constipation and now taking colace and miralax. Pain well controlled. Has not needed any PRN lasix. Jardiance was not prescribed at DC and pt says he does not have diabetes and does not want to take another medication if he does not need it. Weights trending down at home since discharge. 200 > 194 lbs. Limits salt and fluid intake. Taking all medications.   FH: mother and father with CAD  SH: former smoker 1 ppd x 25 years, quit in 1989, no ETOH or drug use  Echo 03/24/2018   - Limited study, however, there is no significant change since the last study on 03/22/2018, LVEF reamins normal at 55-60% with no regional wall motion abnormalities. -Left  ventricle: The cavity size was normal. There was mild concentric hypertrophy. Systolic function was normal. The estimated ejection fraction was in the range of 55% to 60%. Wall motion was normal; there were no regional wall motion abnormalities. Left ventricular diastolic function parameters were normal. - Mitral valve: There was mild regurgitation. Valve area by pressure half-time: 1.98 cm^2. - Left atrium: The atrium was moderately dilated. - Right ventricle: The cavity size was normal. Wall thickness was normal. Systolic function was normal. - Pulmonary arteries: Systolic pressure was mildly increased. PA peak pressure: 37 mm Hg (S). - Pericardium, extracardiac: A mild pericardial effusion was identified lateral to the left ventricle. Features were not consistent with tamponade physiology.  ECHO 03/22/2018 - Left ventricle: The cavity size was normal. Wall thickness was normal. Systolic function was normal. The estimated ejection fraction was in the range of 55% to 60%. Wall motion was normal; there were no regional wall motion abnormalities. The study is not technically sufficient to allow evaluation of LV diastolic function. - Mitral valve: There was mild regurgitation. - Right ventricle: Measured TAPSE and tissue anular velocity would suggest RV systolic function is moderately decreased. - Right atrium: Poorly visualized. Grossly appears enlarged. - Pericardium, extracardiac: There is no clear pericardial effusion. The pericardium adjacent to the RA and RV is poorly visualized. From what is visualized there is no clear effusion, no evidence of RA or RV collapse. - Technically difficult and limited study. Echocontrast was used  to enhance visualization.  Review of systems complete and found to be negative unless listed in HPI.   Past Medical History:  Diagnosis Date  . CAD (coronary artery disease)   . Persistent atrial fibrillation    . Tachycardia     Current Outpatient Medications  Medication Sig Dispense Refill  . amiodarone (PACERONE) 200 MG tablet Take 1 tablet (200 mg total) by mouth 2 (two) times daily. 60 tablet 1  . aspirin 81 MG chewable tablet Chew 81 mg by mouth at bedtime.     Marland Kitchen atorvastatin (LIPITOR) 80 MG tablet Take 1 tablet (80 mg total) by mouth daily at 6 PM. 30 tablet 1  . Calcium Carb-Cholecalciferol (CALCIUM 1000 + D PO) Take 1 tablet by mouth daily.     . clopidogrel (PLAVIX) 75 MG tablet Take 1 tablet (75 mg total) by mouth daily with breakfast. 20 tablet 0  . furosemide (LASIX) 40 MG tablet Take 1 tablet (40 mg total) by mouth daily as needed. only on days that you gain 3 pounds. Weigh yourself each morning 30 tablet 0  . Multiple Vitamin (MULTIVITAMIN) tablet Take 1 tablet by mouth daily.    . Omega-3 Fatty Acids (FISH OIL) 1200 MG CAPS Take 1 capsule by mouth daily.     . potassium chloride SA (K-DUR,KLOR-CON) 20 MEQ tablet Take 1 tablet (20 mEq total) by mouth daily as needed. Only on days that you take a dose of lasix 30 tablet 0  . spironolactone (ALDACTONE) 25 MG tablet Take 0.5 tablets (12.5 mg total) by mouth daily. 30 tablet 0  . traZODone (DESYREL) 50 MG tablet Take 50 mg by mouth at bedtime.      No current facility-administered medications for this encounter.     No Known Allergies    Social History   Socioeconomic History  . Marital status: Unknown    Spouse name: Not on file  . Number of children: Not on file  . Years of education: Not on file  . Highest education level: Not on file  Occupational History  . Not on file  Social Needs  . Financial resource strain: Not on file  . Food insecurity:    Worry: Not on file    Inability: Not on file  . Transportation needs:    Medical: Not on file    Non-medical: Not on file  Tobacco Use  . Smoking status: Former Smoker    Packs/day: 1.00    Years: 25.00    Pack years: 25.00    Types: Cigarettes    Last attempt to  quit: 06/30/1987    Years since quitting: 30.8  . Smokeless tobacco: Never Used  Substance and Sexual Activity  . Alcohol use: Not Currently  . Drug use: Not Currently  . Sexual activity: Not on file  Lifestyle  . Physical activity:    Days per week: Not on file    Minutes per session: Not on file  . Stress: Not on file  Relationships  . Social connections:    Talks on phone: Not on file    Gets together: Not on file    Attends religious service: Not on file    Active member of club or organization: Not on file    Attends meetings of clubs or organizations: Not on file    Relationship status: Not on file  . Intimate partner violence:    Fear of current or ex partner: Not on file    Emotionally abused: Not on  file    Physically abused: Not on file    Forced sexual activity: Not on file  Other Topics Concern  . Not on file  Social History Narrative  . Not on file      Family History  Problem Relation Age of Onset  . Heart disease Mother   . Heart disease Father   . Arrhythmia Sister     Vitals:   04/13/18 0853  BP: 120/68  Pulse: 77  SpO2: 98%  Weight: 89.4 kg (197 lb)     PHYSICAL EXAM: General:  Well appearing. No respiratory difficulty HEENT: normal Neck: supple. JVP flat. Carotids 2+ bilat; no bruits. No lymphadenopathy or thyromegaly appreciated. Cor: PMI nondisplaced. Regular rate & rhythm. No rubs, gallops or murmurs. Midsternal incision CDI Lungs: clear Abdomen: soft, nontender, nondistended. No hepatosplenomegaly. No bruits or masses. Good bowel sounds. Extremities: no cyanosis, clubbing, rash, edema Neuro: alert & oriented x 3, cranial nerves grossly intact. moves all 4 extremities w/o difficulty. Affect pleasant.  ECG: NSR 77 bpm with unifocal PVCs. Low voltage QRS. Personally reviewed.    ASSESSMENT & PLAN:  1.CAD s/p CABG x3 03/22/18 - No s/s ischemia - Continue ASA/statin - Sees TCTS 10/28  2. Chronic diastolic HF - ECHO 9/50DTOI  normal. Limted ECHO on 9/26 showed LVEF 55-60% with normal RV (echo reviewed and EF probably more in the 45-50% range). Required milrinone while inpatient with marginal co-ox off milrinone.  - NYHA I-II - Volume status stable on exam. - Continue Lasix 40 mg PRN. Has not needed.  - Increase spiro to 25 mg daily. BMET today and in 7-10 days at PCP office. - He has a cardiologist in [place] and would like to eventually be discharged from HF clinic back to him due to location convenience.  3. PAF - EKG today shows NSR - Decrease amio to 200 mg daily today. After 1 month, can stop per Dr Haroldine Laws. Check CMET, TFTs today.   - No AC if remains in NSR   4. Anemia  - Post op. Resolved prior to DC  5. Torsades [place]  - Required lidocaine drip 9/26-9/27/19  6. CKD Stage III - BMET today.  7. DM2 - A1C 5.2 02/2018. Sees PCP tomorrow - Consider jardiance  Increase spiro to 25 mg daily Decrease amio to 200 mg daily CMET, TFTs BMET 7-10 day at PCP office Follow up in 4 weeks with plans to stop amio if he remains in [place], NP 04/13/18  Greater than 50% of the 25 minute visit was spent in counseling/coordination of care regarding disease state education, salt/fluid restriction, sliding scale diuretics, and medication compliance.

## 2018-04-13 ENCOUNTER — Encounter (HOSPITAL_COMMUNITY): Payer: Self-pay

## 2018-04-13 ENCOUNTER — Ambulatory Visit (HOSPITAL_COMMUNITY)
Admit: 2018-04-13 | Discharge: 2018-04-13 | Disposition: A | Discharge status: Home / Self Care | Payer: Medicare Other | Attending: Internal Medicine | Admitting: Internal Medicine

## 2018-04-13 VITALS — BP 120/68 | HR 77 | Wt 197.0 lb

## 2018-04-13 DIAGNOSIS — I2510 Atherosclerotic heart disease of native coronary artery without angina pectoris: Secondary | ICD-10-CM

## 2018-04-13 DIAGNOSIS — Z7902 Long term (current) use of antithrombotics/antiplatelets: Secondary | ICD-10-CM | POA: Diagnosis not present

## 2018-04-13 DIAGNOSIS — Z79899 Other long term (current) drug therapy: Secondary | ICD-10-CM | POA: Diagnosis not present

## 2018-04-13 DIAGNOSIS — I480 Paroxysmal atrial fibrillation: Secondary | ICD-10-CM

## 2018-04-13 DIAGNOSIS — E1122 Type 2 diabetes mellitus with diabetic chronic kidney disease: Secondary | ICD-10-CM | POA: Diagnosis not present

## 2018-04-13 DIAGNOSIS — I493 Ventricular premature depolarization: Secondary | ICD-10-CM | POA: Diagnosis not present

## 2018-04-13 DIAGNOSIS — Z8249 Family history of ischemic heart disease and other diseases of the circulatory system: Secondary | ICD-10-CM | POA: Insufficient documentation

## 2018-04-13 DIAGNOSIS — I214 Non-ST elevation (NSTEMI) myocardial infarction: Secondary | ICD-10-CM

## 2018-04-13 DIAGNOSIS — Z7982 Long term (current) use of aspirin: Secondary | ICD-10-CM | POA: Diagnosis not present

## 2018-04-13 DIAGNOSIS — Z951 Presence of aortocoronary bypass graft: Secondary | ICD-10-CM | POA: Diagnosis not present

## 2018-04-13 DIAGNOSIS — Z87891 Personal history of nicotine dependence: Secondary | ICD-10-CM | POA: Insufficient documentation

## 2018-04-13 DIAGNOSIS — N183 Chronic kidney disease, stage 3 unspecified: Secondary | ICD-10-CM

## 2018-04-13 DIAGNOSIS — I5032 Chronic diastolic (congestive) heart failure: Secondary | ICD-10-CM

## 2018-04-13 DIAGNOSIS — D649 Anemia, unspecified: Secondary | ICD-10-CM | POA: Diagnosis not present

## 2018-04-13 LAB — COMPREHENSIVE METABOLIC PANEL
ALT: 17 U/L (ref 0–44)
AST: 18 U/L (ref 15–41)
Albumin: 3.4 g/dL — ABNORMAL LOW (ref 3.5–5.0)
Alkaline Phosphatase: 83 U/L (ref 38–126)
Anion gap: 8 (ref 5–15)
BILIRUBIN TOTAL: 0.7 mg/dL (ref 0.3–1.2)
BUN: 18 mg/dL (ref 8–23)
CO2: 23 mmol/L (ref 22–32)
Calcium: 9.6 mg/dL (ref 8.9–10.3)
Chloride: 107 mmol/L (ref 98–111)
Creatinine, Ser: 1.29 mg/dL — ABNORMAL HIGH (ref 0.61–1.24)
GFR calc Af Amer: >60 mL/min (ref >60)
GFR, EST NON AFRICAN AMERICAN: 53 mL/min — AB (ref >60)
Glucose, Bld: 95 mg/dL (ref 70–99)
POTASSIUM: 4.4 mmol/L (ref 3.5–5.1)
Sodium: 138 mmol/L (ref 135–145)
TOTAL PROTEIN: 6.4 g/dL — AB (ref 6.5–8.1)

## 2018-04-13 MED ORDER — AMIODARONE HCL 200 MG PO TABS: 200.0000 mg | ORAL_TABLET | Freq: Every day | ORAL | 1 refills | Status: DC

## 2018-04-13 MED ORDER — SPIRONOLACTONE 25 MG PO TABS: 25.0000 mg | ORAL_TABLET | Freq: Every day | ORAL | 0 refills | Status: DC

## 2018-04-13 NOTE — Patient Instructions (Addendum)
Today you have been seen at the Heart failure clinic at [facility]   Medication changes: Increase Spiro 25 mg daily  Decrease amiodarone to 200 mg  daily  Lab work done today: CMET, TFT's  Scheduled Lab work:  7-10 days at PCP  We will call you if your lab work is abnormal.. No news is good news!!   Follow up with NP/PA in 4 weeks 05/05/18 at 11:30  Do the following things EVERYDAY: 1) Weigh yourself in the morning before breakfast. Write it down and keep it in a log. 2) Take your medicines as prescribed 3) Eat low salt foods-Limit salt (sodium) to 2000 mg per day.  4) Stay as active as you can everyday 5) Limit all fluids for the day to less than 2 liters

## 2018-04-13 NOTE — Progress Notes (Signed)
CSW met with patient and patient wife in clinic to discuss social determinants.  Patient and wife have no financial concerns at this time- never have issues paying rent for their apartment or for getting food or medications.  Patient and wife have 2 cars and report no problems getting to appointments or getting the things they needs.  They do report fairly high stress level at this time.  They report that their 73yo son lives with them and has behavioral issues due to bipolar.  They do no report any concerns with their safety but state that he is consistently irritable and is noncompliant with treatment and medications (they have had to hospitalize him 6 times in the last few years).  They are currently working on getting him to a supportive community in [place] specifically for those with mental illness- CSW provided active listening and support.  CSW will continue to follow in clinic and assist as needed  Jorge Ny, [place] Social Worker [phone]

## 2018-04-14 ENCOUNTER — Encounter: Payer: Self-pay | Admitting: Family Medicine

## 2018-04-14 ENCOUNTER — Ambulatory Visit (INDEPENDENT_AMBULATORY_CARE_PROVIDER_SITE_OTHER): Payer: Medicare Other | Admitting: Family Medicine

## 2018-04-14 VITALS — BP 100/60 | HR 64 | Ht 72.0 in | Wt 202.0 lb

## 2018-04-14 DIAGNOSIS — Z23 Encounter for immunization: Secondary | ICD-10-CM

## 2018-04-14 DIAGNOSIS — Z7689 Persons encountering health services in other specified circumstances: Secondary | ICD-10-CM | POA: Diagnosis not present

## 2018-04-14 DIAGNOSIS — I208 Other forms of angina pectoris: Secondary | ICD-10-CM

## 2018-04-14 LAB — THYROID PANEL WITH TSH
Free Thyroxine Index: 2.1 (ref 1.2–4.9)
T3 Uptake Ratio: 34 % (ref 24–39)
T4 TOTAL: 6.2 ug/dL (ref 4.5–12.0)
TSH: 13.15 u[IU]/mL — AB (ref 0.450–4.500)

## 2018-04-14 NOTE — Progress Notes (Signed)
Date:  04/14/2018   Name:  David Mccullough   DOB:  [birth year]   MRN:  [id number]   Chief Complaint: [place] (new to [place]) Patient is a 72 year old male who presents for a establishment of care.. The patient reports the following problems: none. Health maintenance has been reviewed influenza vaccine.    Review of Systems  Constitutional: Negative for chills and fever.  HENT: Negative for drooling, ear discharge, ear pain and sore throat.   Respiratory: Negative for cough, shortness of breath and wheezing.   Cardiovascular: Negative for chest pain, palpitations and leg swelling.       M/s discomfort  Gastrointestinal: Positive for constipation. Negative for abdominal pain, blood in stool, diarrhea and nausea.  Endocrine: Negative for polydipsia.  Genitourinary: Negative for dysuria, frequency, hematuria and urgency.  Musculoskeletal: Negative for back pain, myalgias and neck pain.  Skin: Negative for rash.  Allergic/Immunologic: Negative for environmental allergies.  Neurological: Negative for dizziness and headaches.  Hematological: Does not bruise/bleed easily.  Psychiatric/Behavioral: Negative for suicidal ideas. The patient is not nervous/anxious.     Patient Active Problem List   Diagnosis Date Noted  . 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 NSTEMI (non-ST elevated myocardial infarction) ([place]) 03/14/2018    No Known Allergies  Past Surgical History:  Procedure Laterality Date  . CARDIAC ELECTROPHYSIOLOGY STUDY AND ABLATION     patient states due to V-tac  . CORONARY ARTERY BYPASS GRAFT N/A 03/22/2018   Procedure: CORONARY ARTERY BYPASS GRAFTING (CABG) x 3 WITH ENDOSCOPIC HARVESTING OF RIGHT GREATER SAPHENOUS VEIN;  Surgeon: Ivin Poot, MD;  Location: [facility];  Service: Open Heart Surgery;  Laterality: N/A;  . CORONARY STENT INTERVENTION N/A 03/16/2018   Procedure: CORONARY STENT INTERVENTION;  Surgeon: Yolonda Kida, MD;  Location: [facility];   Service: Cardiovascular;  Laterality: N/A;  . heart bypass    . LEFT ATRIAL APPENDAGE OCCLUSION N/A 03/22/2018   Procedure: LEFT ATRIAL APPENDAGE OCCLUSION USING 40 MM ATRICURE ATRICLIP;  Surgeon: Ivin Poot, MD;  Location: [facility];  Service: Open Heart Surgery;  Laterality: N/A;  . LEFT HEART CATH AND CORONARY ANGIOGRAPHY N/A 03/15/2018   Procedure: LEFT HEART CATH AND CORONARY ANGIOGRAPHY and PCI stent;  Surgeon: Yolonda Kida, MD;  Location: [facility];  Service: Cardiovascular;  Laterality: N/A;  . TEE WITHOUT CARDIOVERSION N/A 03/22/2018   Procedure: TRANSESOPHAGEAL ECHOCARDIOGRAM (TEE);  Surgeon: Prescott Gum, Collier Salina, MD;  Location: [facility];  Service: Open Heart Surgery;  Laterality: N/A;    Social History   Tobacco Use  . Smoking status: Former Smoker    Packs/day: 1.00    Years: 25.00    Pack years: 25.00    Types: Cigarettes    Last attempt to quit: 06/30/1987    Years since quitting: 30.8  . Smokeless tobacco: Never Used  Substance Use Topics  . Alcohol use: Not Currently  . Drug use: Not Currently     Medication list has been reviewed and updated.  Current Meds  Medication Sig  . amiodarone (PACERONE) 200 MG tablet Take 1 tablet (200 mg total) by mouth daily.  Marland Kitchen aspirin 81 MG chewable tablet Chew 81 mg by mouth at bedtime.   Marland Kitchen atorvastatin (LIPITOR) 80 MG tablet Take 1 tablet (80 mg total) by mouth daily at 6 PM.  . Calcium Carb-Cholecalciferol (CALCIUM 1000 + D PO) Take 1 tablet by mouth daily.   . clopidogrel (PLAVIX) 75  MG tablet Take 1 tablet (75 mg total) by mouth daily with breakfast.  . furosemide (LASIX) 40 MG tablet Take 1 tablet (40 mg total) by mouth daily as needed. only on days that you gain 3 pounds. Weigh yourself each morning  . Multiple Vitamin (MULTIVITAMIN) tablet Take 1 tablet by mouth daily.  . Omega-3 Fatty Acids (FISH OIL) 1200 MG CAPS Take 1 capsule by mouth daily.   . potassium chloride SA (K-DUR,KLOR-CON) 20 MEQ tablet Take 1 tablet  (20 mEq total) by mouth daily as needed. Only on days that you take a dose of lasix  . spironolactone (ALDACTONE) 25 MG tablet Take 1 tablet (25 mg total) by mouth daily.  . traZODone (DESYREL) 50 MG tablet Take 50 mg by mouth at bedtime.     PHQ 2/9 Scores 04/14/2018  PHQ - 2 Score 0    Physical Exam  Constitutional: He is oriented to person, place, and time.  HENT:  Head: Normocephalic.  Right Ear: External ear normal.  Left Ear: External ear normal.  Nose: Nose normal.  Mouth/Throat: Oropharynx is clear and moist.  Eyes: Pupils are equal, round, and reactive to light. Conjunctivae and EOM are normal. Right eye exhibits no discharge. Left eye exhibits no discharge. No scleral icterus.  Neck: Normal range of motion. Neck supple. No JVD present. No tracheal deviation present. No thyromegaly present.  Cardiovascular: Normal rate, regular rhythm, normal heart sounds and intact distal pulses. Exam reveals no gallop and no friction rub.  No murmur heard. Pulmonary/Chest: Breath sounds normal. No respiratory distress. He has no wheezes. He has no rales.  Abdominal: Soft. Bowel sounds are normal. He exhibits no mass. There is no hepatosplenomegaly. There is no tenderness. There is no rebound, no guarding and no CVA tenderness.  Musculoskeletal: Normal range of motion. He exhibits no edema or tenderness.  Lymphadenopathy:    He has no cervical adenopathy.  Neurological: He is alert and oriented to person, place, and time. He has normal strength and normal reflexes. No cranial nerve deficit.  Skin: Skin is warm. No rash noted.  Nursing note and vitals reviewed.   BP 100/60   Pulse 64   Ht 6' (1.829 m)   Wt 202 lb (91.6 kg)   BMI 27.40 kg/m   Assessment and Plan:  1. Establishing care with new doctor, encounter for Patient to establish care with new physician.  2. Flu vaccine need Discussed and administered. - Flu vaccine HIGH DOSE PF (Fluzone High dose)   Dr. [facility]  04/14/2018

## 2018-04-22 ENCOUNTER — Other Ambulatory Visit: Payer: Self-pay | Admitting: Cardiothoracic Surgery

## 2018-04-22 DIAGNOSIS — Z951 Presence of aortocoronary bypass graft: Secondary | ICD-10-CM

## 2018-04-25 ENCOUNTER — Ambulatory Visit (INDEPENDENT_AMBULATORY_CARE_PROVIDER_SITE_OTHER): Payer: Self-pay | Admitting: Surgical

## 2018-04-25 ENCOUNTER — Other Ambulatory Visit (HOSPITAL_COMMUNITY): Payer: Self-pay | Admitting: Cardiology

## 2018-04-25 ENCOUNTER — Encounter: Payer: Self-pay | Admitting: Surgical

## 2018-04-25 ENCOUNTER — Ambulatory Visit
Admission: RE | Admit: 2018-04-25 | Discharge: 2018-04-25 | Disposition: A | Discharge status: Home / Self Care | Payer: Medicare Other | Source: Ambulatory Visit | Attending: Cardiothoracic Surgery | Admitting: Cardiothoracic Surgery

## 2018-04-25 ENCOUNTER — Ambulatory Visit (HOSPITAL_COMMUNITY)
Admission: RE | Admit: 2018-04-25 | Discharge: 2018-04-25 | Disposition: A | Discharge status: Home / Self Care | Payer: Medicare Other | Source: Ambulatory Visit | Attending: Cardiology | Admitting: Cardiology

## 2018-04-25 VITALS — BP 112/79 | HR 76 | Resp 20 | Ht 72.0 in | Wt 201.0 lb

## 2018-04-25 DIAGNOSIS — Z951 Presence of aortocoronary bypass graft: Secondary | ICD-10-CM | POA: Diagnosis not present

## 2018-04-25 DIAGNOSIS — I2510 Atherosclerotic heart disease of native coronary artery without angina pectoris: Secondary | ICD-10-CM

## 2018-04-25 DIAGNOSIS — I214 Non-ST elevation (NSTEMI) myocardial infarction: Secondary | ICD-10-CM | POA: Diagnosis not present

## 2018-04-25 LAB — BASIC METABOLIC PANEL
Anion gap: 6 (ref 5–15)
BUN: 18 mg/dL (ref 8–23)
CALCIUM: 9.6 mg/dL (ref 8.9–10.3)
CO2: 25 mmol/L (ref 22–32)
CREATININE: 1.25 mg/dL — AB (ref 0.61–1.24)
Chloride: 108 mmol/L (ref 98–111)
GFR calc Af Amer: >60 mL/min (ref >60)
GFR calc non Af Amer: 55 mL/min — ABNORMAL LOW (ref >60)
GLUCOSE: 117 mg/dL — AB (ref 70–99)
Potassium: 4.2 mmol/L (ref 3.5–5.1)
Sodium: 139 mmol/L (ref 135–145)

## 2018-04-25 NOTE — Patient Instructions (Signed)
Discussed routine postoperative activity progression including lifting restrictions and driving.

## 2018-04-25 NOTE — Progress Notes (Signed)
[address]       [place] [address]             [phone]           [address]       [place] [address]             [phone]      David Mccullough [place] Medical Record #[id number] Date of Birth: [birth year]  Referring: Yolonda Kida, MD Primary Care: Patient, No Pcp Per Primary Cardiologist: No primary care provider on file.   Chief Complaint:   POST OP FOLLOW UP OPERATIVE REPORT  DATE OF PROCEDURE:  03/22/2018  OPERATION: 1.  Coronary artery bypass grafting x3 (left internal mammary artery to left anterior descending, saphenous vein graft to ramus intermedius, saphenous vein graft to circumflex marginal). 2.  Endoscopic harvest of right leg greater saphenous vein.  SURGEON:  Ivin Poot, MD  ASSISTANT:  Jadene Pierini PA-C.  History of Present Illness:    Patient is a 72 year old male status post the above described procedure.  He is seen in routine postsurgical follow-up.  He reports that overall he feels quite well.  He has not been taking any pain medication.  He denies shortness of breath.  He does have some incisional discomfort but it is significantly improved over time.  He is tolerating gradually increasing ambulation and activities without significant difficulties.  He has been seen in post follow-up by Dr. Clayborn Bigness as well as the advanced heart failure team and they have made some minor adjustments to his medications over time.  Overall he and his family are quite pleased with his progress.      Past Medical History:  Diagnosis Date  . CAD (coronary artery disease)   . Colon polyps   . Depression   . Elevated PSA   . Myocardial infarction ([place])    x 2  . Persistent atrial fibrillation   . Tachycardia      Social History   Tobacco Use  Smoking Status Former Smoker  . Packs/day: 1.00  . Years: 25.00  . Pack years: 25.00  . Types: Cigarettes  . Last attempt to quit: 06/30/1987  . Years  since quitting: 30.8  Smokeless Tobacco Never Used    Social History   Substance and Sexual Activity  Alcohol Use Not Currently     No Known Allergies  Current Outpatient Medications  Medication Sig Dispense Refill  . amiodarone (PACERONE) 200 MG tablet Take 1 tablet (200 mg total) by mouth daily. 60 tablet 1  . aspirin 81 MG chewable tablet Chew 81 mg by mouth at bedtime.     Marland Kitchen atorvastatin (LIPITOR) 80 MG tablet Take 1 tablet (80 mg total) by mouth daily at 6 PM. 30 tablet 1  . Calcium Carb-Cholecalciferol (CALCIUM 1000 + D PO) Take 1 tablet by mouth daily.     . clopidogrel (PLAVIX) 75 MG tablet Take 1 tablet (75 mg total) by mouth daily with breakfast. 20 tablet 0  . furosemide (LASIX) 40 MG tablet Take 1 tablet (40 mg total) by mouth daily as needed. only on days that you gain 3 pounds. Weigh yourself each morning 30 tablet 0  . Multiple Vitamin (MULTIVITAMIN) tablet Take 1 tablet by mouth daily.    . Omega-3 Fatty Acids (FISH OIL) 1200 MG CAPS Take 1 capsule by mouth daily.     . potassium chloride SA (K-DUR,KLOR-CON) 20 MEQ tablet Take  1 tablet (20 mEq total) by mouth daily as needed. Only on days that you take a dose of lasix 30 tablet 0  . spironolactone (ALDACTONE) 25 MG tablet Take 1 tablet (25 mg total) by mouth daily. 30 tablet 0  . traZODone (DESYREL) 50 MG tablet Take 50 mg by mouth at bedtime.      No current facility-administered medications for this visit.        Physical Exam: Ht 6' (1.829 m)   Wt 91.2 kg   BMI 27.26 kg/m   General appearance: alert, cooperative and no distress Heart: regular rate and rhythm Lungs: clear to auscultation bilaterally Abdomen: Benign exam Extremities: No edema Wound: Incisions healing well without evidence of infection.   Diagnostic Studies & Laboratory data:     Recent Radiology Findings:   Dg Chest 2 View  Result Date: 04/25/2018 CLINICAL DATA:  Status post CABG. EXAM: CHEST - 2 VIEW COMPARISON:  03/27/2018  FINDINGS: Chronic mild cardiomegaly. Status post CABG and left atrial clipping. Resolved atelectasis. Mild blunting of the lateral left costophrenic sulcus. No pulmonary edema. No convincing or significant pneumothorax. IMPRESSION: Trace pleural fluid on the left. Electronically Signed   By: Monte Fantasia M.D.   On: 04/25/2018 13:16      Recent Lab Findings: Lab Results  Component Value Date   WBC 13.7 (H) 03/30/2018   HGB 10.6 (L) 03/30/2018   HCT 32.9 (L) 03/30/2018   PLT 414 (H) 03/30/2018   GLUCOSE 95 04/13/2018   CHOL 158 03/17/2018   TRIG 111 03/17/2018   HDL 36 (L) 03/17/2018   LDLCALC 100 (H) 03/17/2018   ALT 17 04/13/2018   AST 18 04/13/2018   NA 138 04/13/2018   K 4.4 04/13/2018   CL 107 04/13/2018   CREATININE 1.29 (H) 04/13/2018   BUN 18 04/13/2018   CO2 23 04/13/2018   TSH 13.150 (H) 04/13/2018   INR 1.39 03/22/2018   HGBA1C 5.2 03/17/2018      Assessment / Plan: Patient is doing well in his postsurgical recovery following multivessel CABG.  He has been seen postsurgical follow-up by the advanced heart failure team who is assisting with medication adjustments.  He has also established a new primary care physician to assist with management of his multiple medical comorbidities.  Currently from a surgical perspective he is doing quite well with no current specific issues that require follow-up.  We will see him again on a as needed basis for any surgically related needs or at request.          David Giovanni, PA-C 04/25/2018 2:24 PM

## 2018-04-27 ENCOUNTER — Ambulatory Visit: Payer: Medicare Other | Admitting: Cardiothoracic Surgery

## 2018-05-02 ENCOUNTER — Encounter: Payer: Medicare Other | Attending: Internal Medicine | Admitting: *Deleted

## 2018-05-02 ENCOUNTER — Encounter: Payer: Self-pay | Admitting: *Deleted

## 2018-05-02 VITALS — Ht 71.1 in | Wt 202.5 lb

## 2018-05-02 DIAGNOSIS — I4819 Other persistent atrial fibrillation: Secondary | ICD-10-CM | POA: Diagnosis not present

## 2018-05-02 DIAGNOSIS — Z7902 Long term (current) use of antithrombotics/antiplatelets: Secondary | ICD-10-CM | POA: Insufficient documentation

## 2018-05-02 DIAGNOSIS — Z79899 Other long term (current) drug therapy: Secondary | ICD-10-CM | POA: Insufficient documentation

## 2018-05-02 DIAGNOSIS — Z951 Presence of aortocoronary bypass graft: Secondary | ICD-10-CM | POA: Insufficient documentation

## 2018-05-02 DIAGNOSIS — Z7982 Long term (current) use of aspirin: Secondary | ICD-10-CM | POA: Insufficient documentation

## 2018-05-02 DIAGNOSIS — Z87891 Personal history of nicotine dependence: Secondary | ICD-10-CM | POA: Diagnosis not present

## 2018-05-02 DIAGNOSIS — F329 Major depressive disorder, single episode, unspecified: Secondary | ICD-10-CM | POA: Diagnosis not present

## 2018-05-02 DIAGNOSIS — I2510 Atherosclerotic heart disease of native coronary artery without angina pectoris: Secondary | ICD-10-CM | POA: Diagnosis not present

## 2018-05-02 DIAGNOSIS — I252 Old myocardial infarction: Secondary | ICD-10-CM | POA: Insufficient documentation

## 2018-05-02 NOTE — Progress Notes (Signed)
Cardiac Individual Treatment Plan  Patient Details  Name: David Mccullough MRN: [id number] Date of Birth: 05/15/1946 Referring Provider:     Cardiac Rehab from 05/02/2018 in [facility] Cardiac and Pulmonary Rehab  Referring Provider  [name] MD [Local Cardiologist: [name], MD]      Initial Encounter Date:    Cardiac Rehab from 05/02/2018 in [facility] Cardiac and Pulmonary Rehab  Date  05/02/18      Visit Diagnosis: S/P CABG x 3  Patient's Home Medications on Admission:  Current Outpatient Medications:  .  aspirin 81 MG chewable tablet, Chew 81 mg by mouth at bedtime. , Disp: , Rfl:  .  atorvastatin (LIPITOR) 80 MG tablet, Take 1 tablet (80 mg total) by mouth daily at 6 PM., Disp: 30 tablet, Rfl: 1 .  Calcium Carb-Cholecalciferol (CALCIUM 1000 + D PO), Take 1 tablet by mouth daily. , Disp: , Rfl:  .  clopidogrel (PLAVIX) 75 MG tablet, Take 1 tablet (75 mg total) by mouth daily with breakfast., Disp: 20 tablet, Rfl: 0 .  furosemide (LASIX) 40 MG tablet, Take 1 tablet (40 mg total) by mouth daily as needed. only on days that you gain 3 pounds. Weigh yourself each morning, Disp: 30 tablet, Rfl: 0 .  Multiple Vitamin (MULTIVITAMIN) tablet, Take 1 tablet by mouth daily., Disp: , Rfl:  .  Omega-3 Fatty Acids (FISH OIL) 1200 MG CAPS, Take 1 capsule by mouth daily. , Disp: , Rfl:  .  potassium chloride SA (K-DUR,KLOR-CON) 20 MEQ tablet, Take 1 tablet (20 mEq total) by mouth daily as needed. Only on days that you take a dose of lasix, Disp: 30 tablet, Rfl: 0 .  spironolactone (ALDACTONE) 25 MG tablet, Take 1 tablet (25 mg total) by mouth daily., Disp: 30 tablet, Rfl: 0 .  traZODone (DESYREL) 50 MG tablet, Take 50 mg by mouth at bedtime. , Disp: , Rfl:  .  amiodarone (PACERONE) 200 MG tablet, Take 1 tablet (200 mg total) by mouth daily. (Patient not taking: Reported on 05/02/2018), Disp: 60 tablet, Rfl: 1  Past Medical History: Past Medical History:  Diagnosis Date  . CAD (coronary artery  disease)   . Colon polyps   . Depression   . Elevated PSA   . Myocardial infarction ([place])    x 2  . Persistent atrial fibrillation   . Tachycardia     Tobacco Use: Social History   Tobacco Use  Smoking Status Former Smoker  . Packs/day: 1.00  . Years: 25.00  . Pack years: 25.00  . Types: Cigarettes  . Last attempt to quit: 06/30/1987  . Years since quitting: 30.8  Smokeless Tobacco Never Used    Labs: Recent Review Flowsheet Data    Labs for ITP Cardiac and Pulmonary Rehab Latest Ref Rng & Units 03/27/2018 03/28/2018 03/29/2018 03/29/2018 03/30/2018   Cholestrol 0 - 200 mg/dL - - - - -   LDLCALC 0 - 99 mg/dL - - - - -   HDL >40 mg/dL - - - - -   Trlycerides <150 mg/dL - - - - -   Hemoglobin A1c 4.8 - 5.6 % - - - - -   PHART 7.350 - 7.450 - - - - -   PCO2ART 32.0 - 48.0 mmHg - - - - -   HCO3 20.0 - 28.0 mmol/L - - - - -   TCO2 22 - 32 mmol/L - - - - -   ACIDBASEDEF 0.0 - 2.0 mmol/L - - - - -  O2SAT % 54.1 56.6 49.4 48.3 51.2       Exercise Target Goals: Exercise Program Goal: Individual exercise prescription set using results from initial 6 min walk test and THRR while considering  patient's activity barriers and safety.   Exercise Prescription Goal: Initial exercise prescription builds to 30-45 minutes a day of aerobic activity, 2-3 days per week.  Home exercise guidelines will be given to patient during program as part of exercise prescription that the participant will acknowledge.  Activity Barriers & Risk Stratification: Activity Barriers & Cardiac Risk Stratification - 05/02/18 1347      Activity Barriers & Cardiac Risk Stratification   Activity Barriers  Deconditioning;Muscular Weakness    Cardiac Risk Stratification  High       6 Minute Walk: 6 Minute Walk    Row Name 05/02/18 1456         6 Minute Walk   Phase  Initial     Distance  1264 feet     Walk Time  6 minutes     MPH  2.39     METS  2.59     RPE  11     VO2 Peak  9.06     Symptoms  No      Resting HR  76 bpm     Resting BP  136/66     Resting Oxygen Saturation   100 %     Exercise Oxygen Saturation  during 6 min walk  98 %     Max Ex. HR  81 bpm     Max Ex. BP  144/70     2 Minute Post BP  136/62        Oxygen Initial Assessment:   Oxygen Re-Evaluation:   Oxygen Discharge (Final Oxygen Re-Evaluation):   Initial Exercise Prescription: Initial Exercise Prescription - 05/02/18 1400      Date of Initial Exercise RX and Referring Provider   Date  05/02/18    Referring Provider  [name] MD   Local Cardiologist: [name], MD     Treadmill   MPH  1.9    Grade  0.5    Minutes  15    METs  2.59      REL-XR   Level  1    Speed  50    Minutes  15    METs  2.5      T5 Nustep   Level  1    SPM  80    Minutes  15    METs  2.5      Prescription Details   Frequency (times per week)  2    Duration  Progress to 30 minutes of continuous aerobic without signs/symptoms of physical distress      Intensity   THRR 40-80% of Max Heartrate  104-133    Ratings of Perceived Exertion  11-13    Perceived Dyspnea  0-4      Progression   Progression  Continue to progress workloads to maintain intensity without signs/symptoms of physical distress.      Resistance Training   Training Prescription  Yes    Weight  4 lbs    Reps  10-15       Perform Capillary Blood Glucose checks as needed.  Exercise Prescription Changes: Exercise Prescription Changes    Row Name 05/02/18 1300             Response to Exercise   Blood Pressure (Admit)  136/66  Blood Pressure (Exercise)  144/70       Blood Pressure (Exit)  136/62       Heart Rate (Admit)  76 bpm       Heart Rate (Exercise)  81 bpm       Heart Rate (Exit)  74 bpm       Oxygen Saturation (Admit)  100 %       Oxygen Saturation (Exercise)  98 %       Rating of Perceived Exertion (Exercise)  11       Symptoms  none       Comments  walk test results          Exercise  Comments:   Exercise Goals and Review: Exercise Goals    Row Name 05/02/18 1501             Exercise Goals   Increase Physical Activity  Yes       Intervention  Provide advice, education, support and counseling about physical activity/exercise needs.;Develop an individualized exercise prescription for aerobic and resistive training based on initial evaluation findings, risk stratification, comorbidities and participant's personal goals.       Expected Outcomes  Short Term: Attend rehab on a regular basis to increase amount of physical activity.;Long Term: Add in home exercise to make exercise part of routine and to increase amount of physical activity.;Long Term: Exercising regularly at least 3-5 days a week.       Increase Strength and Stamina  Yes       Intervention  Provide advice, education, support and counseling about physical activity/exercise needs.;Develop an individualized exercise prescription for aerobic and resistive training based on initial evaluation findings, risk stratification, comorbidities and participant's personal goals.       Expected Outcomes  Short Term: Increase workloads from initial exercise prescription for resistance, speed, and METs.;Short Term: Perform resistance training exercises routinely during rehab and add in resistance training at home;Long Term: Improve cardiorespiratory fitness, muscular endurance and strength as measured by increased METs and functional capacity (6MWT)       Able to understand and use rate of perceived exertion (RPE) scale  Yes       Intervention  Provide education and explanation on how to use RPE scale       Expected Outcomes  Short Term: Able to use RPE daily in rehab to express subjective intensity level;Long Term:  Able to use RPE to guide intensity level when exercising independently       Knowledge and understanding of Target Heart Rate Range (THRR)  Yes       Intervention  Provide education and explanation of THRR including how  the numbers were predicted and where they are located for reference       Expected Outcomes  Short Term: Able to state/look up THRR;Short Term: Able to use daily as guideline for intensity in rehab;Long Term: Able to use THRR to govern intensity when exercising independently       Able to check pulse independently  Yes       Intervention  Provide education and demonstration on how to check pulse in carotid and radial arteries.;Review the importance of being able to check your own pulse for safety during independent exercise       Expected Outcomes  Short Term: Able to explain why pulse checking is important during independent exercise;Long Term: Able to check pulse independently and accurately       Understanding of Exercise Prescription  Yes  Intervention  Provide education, explanation, and written materials on patient's individual exercise prescription       Expected Outcomes  Short Term: Able to explain program exercise prescription;Long Term: Able to explain home exercise prescription to exercise independently          Exercise Goals Re-Evaluation :   Discharge Exercise Prescription (Final Exercise Prescription Changes): Exercise Prescription Changes - 05/02/18 1300      Response to Exercise   Blood Pressure (Admit)  136/66    Blood Pressure (Exercise)  144/70    Blood Pressure (Exit)  136/62    Heart Rate (Admit)  76 bpm    Heart Rate (Exercise)  81 bpm    Heart Rate (Exit)  74 bpm    Oxygen Saturation (Admit)  100 %    Oxygen Saturation (Exercise)  98 %    Rating of Perceived Exertion (Exercise)  11    Symptoms  none    Comments  walk test results       Nutrition:  Target Goals: Understanding of nutrition guidelines, daily intake of sodium '1500mg'$ , cholesterol '200mg'$ , calories 30% from fat and 7% or less from saturated fats, daily to have 5 or more servings of fruits and vegetables.  Biometrics: Pre Biometrics - 05/02/18 1502      Pre Biometrics   Height  5' 11.1"  (1.806 m)    Weight  202 lb 8 oz (91.9 kg)    Waist Circumference  40 inches    Hip Circumference  38 inches    Waist to Hip Ratio  1.05 %    BMI (Calculated)  28.16    Single Leg Stand  8.07 seconds        Nutrition Therapy Plan and Nutrition Goals: Nutrition Therapy & Goals - 05/02/18 1346      Intervention Plan   Intervention  Prescribe, educate and counsel regarding individualized specific dietary modifications aiming towards targeted core components such as weight, hypertension, lipid management, diabetes, heart failure and other comorbidities.;Nutrition handout(s) given to patient.    Expected Outcomes  Short Term Goal: Understand basic principles of dietary content, such as calories, fat, sodium, cholesterol and nutrients.;Long Term Goal: Adherence to prescribed nutrition plan.;Short Term Goal: A plan has been developed with personal nutrition goals set during dietitian appointment.       Nutrition Assessments: Nutrition Assessments - 05/02/18 1346      MEDFICTS Scores   Pre Score  28       Nutrition Goals Re-Evaluation:   Nutrition Goals Discharge (Final Nutrition Goals Re-Evaluation):   Psychosocial: Target Goals: Acknowledge presence or absence of significant depression and/or stress, maximize coping skills, provide positive support system. Participant is able to verbalize types and ability to use techniques and skills needed for reducing stress and depression.   Initial Review & Psychosocial Screening: Initial Psych Review & Screening - 05/02/18 1342      Initial Review   Current issues with  Current Stress Concerns;History of Depression    Source of Stress Concerns  Family    Comments  Mr. Frutiger son struggles with Bipolar. He is currently homeless, in and out of the hospital and on and off his meds. Mr. Lanphere and his wife reported this is a major stressful for their family. Mrs. Bonny expressed concern for her husband that before they moved from [facility] Mr.  Peregoy was seeing a psychiatrist. His psychiatrist had taken him off his depression medications because he was doing so well. They both are concerned he  may need to go back on them since his heart surgery and recent events with his son. They were encouraged to reach out to his past psychiatrist to either see him or be referred to someone local.       [place]?  Yes   wife, friends     Barriers   Psychosocial barriers to participate in program  The patient should benefit from training in stress management and relaxation.      Screening Interventions   Interventions  Encouraged to exercise;Program counselor consult;To provide support and resources with identified psychosocial needs;Provide feedback about the scores to participant    Expected Outcomes  Short Term goal: Utilizing psychosocial counselor, staff and physician to assist with identification of specific Stressors or current issues interfering with healing process. Setting desired goal for each stressor or current issue identified.;Long Term Goal: Stressors or current issues are controlled or eliminated.;Short Term goal: Identification and review with participant of any Quality of Life or Depression concerns found by scoring the questionnaire.;Long Term goal: The participant improves quality of Life and PHQ9 Scores as seen by post scores and/or verbalization of changes       Quality of Life Scores:  Quality of Life - 05/02/18 1346      Quality of Life   Select  Quality of Life      Quality of Life Scores   Health/Function Pre  15.85 %    Socioeconomic Pre  24 %    Psych/Spiritual Pre  17.07 %    Family Pre  16.8 %    GLOBAL Pre  17.85 %      Scores of 19 and below usually indicate a poorer quality of life in these areas.  A difference of  2-3 points is a clinically meaningful difference.  A difference of 2-3 points in the total score of the Quality of Life Index has been associated with significant  improvement in overall quality of life, self-image, physical symptoms, and general health in studies assessing change in quality of life.  PHQ-9: Recent Review Flowsheet Data    Depression screen [facility] 2/9 05/02/2018 04/14/2018   Decreased Interest 1 0   Down, Depressed, Hopeless 2 0   PHQ - 2 Score 3 0   Altered sleeping 0 -   Tired, decreased energy 1 -   Change in appetite 1 -   Feeling bad or failure about yourself  0 -   Trouble concentrating 0 -   Moving slowly or fidgety/restless 0 -   Suicidal thoughts 1  -   PHQ-9 Score 6 -   Difficult doing work/chores Somewhat difficult -     Interpretation of Total Score  Total Score Depression Severity:  1-4 = Minimal depression, 5-9 = Mild depression, 10-14 = Moderate depression, 15-19 = Moderately severe depression, 20-27 = Severe depression   Psychosocial Evaluation and Intervention:   Psychosocial Re-Evaluation:   Psychosocial Discharge (Final Psychosocial Re-Evaluation):   Vocational Rehabilitation: Provide vocational rehab assistance to qualifying candidates.   Vocational Rehab Evaluation & Intervention: Vocational Rehab - 05/02/18 1342      Initial Vocational Rehab Evaluation & Intervention   Assessment shows need for Vocational Rehabilitation  No       Education: Education Goals: Education classes will be provided on a variety of topics geared toward better understanding of heart health and risk factor modification. Participant will state understanding/return demonstration of topics presented as noted by education test scores.  Learning Barriers/Preferences: Learning  Barriers/Preferences - 05/02/18 1339      Learning Barriers/Preferences   Learning Barriers  None    Learning Preferences  None       Education Topics:  AED/CPR: - Group verbal and written instruction with the use of models to demonstrate the basic use of the AED with the basic ABC's of resuscitation.   General Nutrition Guidelines/Fats and  Fiber: -Group instruction provided by verbal, written material, models and posters to present the general guidelines for heart healthy nutrition. Gives an explanation and review of dietary fats and fiber.   Controlling Sodium/Reading Food Labels: -Group verbal and written material supporting the discussion of sodium use in heart healthy nutrition. Review and explanation with models, verbal and written materials for utilization of the food label.   Exercise Physiology & General Exercise Guidelines: - Group verbal and written instruction with models to review the exercise physiology of the cardiovascular system and associated critical values. Provides general exercise guidelines with specific guidelines to those with heart or lung disease.    Aerobic Exercise & Resistance Training: - Gives group verbal and written instruction on the various components of exercise. Focuses on aerobic and resistive training programs and the benefits of this training and how to safely progress through these programs..   Flexibility, Balance, Mind/Body Relaxation: Provides group verbal/written instruction on the benefits of flexibility and balance training, including mind/body exercise modes such as yoga, pilates and tai chi.  Demonstration and skill practice provided.   Stress and Anxiety: - Provides group verbal and written instruction about the health risks of elevated stress and causes of high stress.  Discuss the correlation between heart/lung disease and anxiety and treatment options. Review healthy ways to manage with stress and anxiety.   Depression: - Provides group verbal and written instruction on the correlation between heart/lung disease and depressed mood, treatment options, and the stigmas associated with seeking treatment.   Anatomy & Physiology of the Heart: - Group verbal and written instruction and models provide basic cardiac anatomy and physiology, with the coronary electrical and arterial  systems. Review of Valvular disease and Heart Failure   Cardiac Procedures: - Group verbal and written instruction to review commonly prescribed medications for heart disease. Reviews the medication, class of the drug, and side effects. Includes the steps to properly store meds and maintain the prescription regimen. (beta blockers and nitrates)   Cardiac Medications I: - Group verbal and written instruction to review commonly prescribed medications for heart disease. Reviews the medication, class of the drug, and side effects. Includes the steps to properly store meds and maintain the prescription regimen.   Cardiac Medications II: -Group verbal and written instruction to review commonly prescribed medications for heart disease. Reviews the medication, class of the drug, and side effects. (all other drug classes)    Go Sex-Intimacy & Heart Disease, Get SMART - Goal Setting: - Group verbal and written instruction through game format to discuss heart disease and the return to sexual intimacy. Provides group verbal and written material to discuss and apply goal setting through the application of the S.M.A.R.T. Method.   Other Matters of the Heart: - Provides group verbal, written materials and models to describe Stable Angina and Peripheral Artery. Includes description of the disease process and treatment options available to the cardiac patient.   Exercise & Equipment Safety: - Individual verbal instruction and demonstration of equipment use and safety with use of the equipment.   Cardiac Rehab from 05/02/2018 in [facility] Cardiac and Pulmonary Rehab  Date  05/02/18  Educator  [place]  Instruction Review Code  1- Verbalizes Understanding      Infection Prevention: - Provides verbal and written material to individual with discussion of infection control including proper hand washing and proper equipment cleaning during exercise session.   Cardiac Rehab from 05/02/2018 in [facility] Cardiac and  Pulmonary Rehab  Date  05/02/18  Educator  [facility]  Instruction Review Code  1- Verbalizes Understanding      Falls Prevention: - Provides verbal and written material to individual with discussion of falls prevention and safety.   Cardiac Rehab from 05/02/2018 in [facility] Cardiac and Pulmonary Rehab  Date  05/02/18  Educator  [facility]  Instruction Review Code  1- Verbalizes Understanding      Diabetes: - Individual verbal and written instruction to review signs/symptoms of diabetes, desired ranges of glucose level fasting, after meals and with exercise. Acknowledge that pre and post exercise glucose checks will be done for 3 sessions at entry of program.   Know Your Numbers and Risk Factors: -Group verbal and written instruction about important numbers in your health.  Discussion of what are risk factors and how they play a role in the disease process.  Review of Cholesterol, Blood Pressure, Diabetes, and BMI and the role they play in your overall health.   Sleep Hygiene: -Provides group verbal and written instruction about how sleep can affect your health.  Define sleep hygiene, discuss sleep cycles and impact of sleep habits. Review good sleep hygiene tips.    Other: -Provides group and verbal instruction on various topics (see comments)   Knowledge Questionnaire Score: Knowledge Questionnaire Score - 05/02/18 1339      Knowledge Questionnaire Score   Pre Score  22/26   correct answers reviewed with Ardyth Gal, focus on angina, exercise      Core Components/Risk Factors/Patient Goals at Admission: Personal Goals and Risk Factors at Admission - 05/02/18 1338      Core Components/Risk Factors/Patient Goals on Admission    Weight Management  Yes;Weight Loss    Intervention  Weight Management: Develop a combined nutrition and exercise program designed to reach desired caloric intake, while maintaining appropriate intake of nutrient and fiber, sodium and fats, and appropriate energy  expenditure required for the weight goal.;Weight Management: Provide education and appropriate resources to help participant work on and attain dietary goals.;Weight Management/Obesity: Establish reasonable short term and long term weight goals.    Admit Weight  202 lb (91.6 kg)    Goal Weight: Short Term  198 lb (89.8 kg)    Goal Weight: Long Term  195 lb (88.5 kg)    Expected Outcomes  Short Term: Continue to assess and modify interventions until short term weight is achieved;Long Term: Adherence to nutrition and physical activity/exercise program aimed toward attainment of established weight goal;Weight Maintenance: Understanding of the daily nutrition guidelines, which includes 25-35% calories from fat, 7% or less cal from saturated fats, less than '200mg'$  cholesterol, less than 1.5gm of sodium, & 5 or more servings of fruits and vegetables daily;Weight Loss: Understanding of general recommendations for a balanced deficit meal plan, which promotes 1-2 lb weight loss per week and includes a negative energy balance of [phone] kcal/d;Understanding recommendations for meals to include 15-35% energy as protein, 25-35% energy from fat, 35-60% energy from carbohydrates, less than '200mg'$  of dietary cholesterol, 20-35 gm of total fiber daily;Understanding of distribution of calorie intake throughout the day with the consumption of 4-5 meals/snacks    Lipids  Yes  Intervention  Provide education and support for participant on nutrition & aerobic/resistive exercise along with prescribed medications to achieve LDL '70mg'$ , HDL >'40mg'$ .    Expected Outcomes  Short Term: Participant states understanding of desired cholesterol values and is compliant with medications prescribed. Participant is following exercise prescription and nutrition guidelines.;Long Term: Cholesterol controlled with medications as prescribed, with individualized exercise RX and with personalized nutrition plan. Value goals: LDL < '70mg'$ , HDL > 40 mg.        Core Components/Risk Factors/Patient Goals Review:    Core Components/Risk Factors/Patient Goals at Discharge (Final Review):    ITP Comments: ITP Comments    Row Name 05/02/18 1327           ITP Comments  Med Review completed. Initial ITP created. Documentation can be found in [facility] 9/18          Comments: Initial ITP

## 2018-05-02 NOTE — Patient Instructions (Signed)
Patient Instructions  Patient Details  Name: David Mccullough MRN: [id number] Date of Birth: 1945/09/21 Referring Provider:  Jolaine Artist, MD  Below are your personal goals for exercise, nutrition, and risk factors. Our goal is to help you stay on track towards obtaining and maintaining these goals. We will be discussing your progress on these goals with you throughout the program.  Initial Exercise Prescription: Initial Exercise Prescription - 05/02/18 1400      Date of Initial Exercise RX and Referring Provider   Date  05/02/18    Referring Provider  [name] MD   Local Cardiologist: [name], MD     Treadmill   MPH  1.9    Grade  0.5    Minutes  15    METs  2.59      REL-XR   Level  1    Speed  50    Minutes  15    METs  2.5      T5 Nustep   Level  1    SPM  80    Minutes  15    METs  2.5      Prescription Details   Frequency (times per week)  2    Duration  Progress to 30 minutes of continuous aerobic without signs/symptoms of physical distress      Intensity   THRR 40-80% of Max Heartrate  104-133    Ratings of Perceived Exertion  11-13    Perceived Dyspnea  0-4      Progression   Progression  Continue to progress workloads to maintain intensity without signs/symptoms of physical distress.      Resistance Training   Training Prescription  Yes    Weight  4 lbs    Reps  10-15       Exercise Goals: Frequency: Be able to perform aerobic exercise two to three times per week in program working toward 2-5 days per week of home exercise.  Intensity: Work with a perceived exertion of 11 (fairly light) - 15 (hard) while following your exercise prescription.  We will make changes to your prescription with you as you progress through the program.   Duration: Be able to do 30 to 45 minutes of continuous aerobic exercise in addition to a 5 minute warm-up and a 5 minute cool-down routine.   Nutrition Goals: Your personal nutrition goals will be  established when you do your nutrition analysis with the dietician.  The following are general nutrition guidelines to follow: Cholesterol < 200mg /day Sodium < 1500mg /day Fiber: Men over 50 yrs - 30 grams per day  Personal Goals: Personal Goals and Risk Factors at Admission - 05/02/18 1338      Core Components/Risk Factors/Patient Goals on Admission    Weight Management  Yes;Weight Loss    Intervention  Weight Management: Develop a combined nutrition and exercise program designed to reach desired caloric intake, while maintaining appropriate intake of nutrient and fiber, sodium and fats, and appropriate energy expenditure required for the weight goal.;Weight Management: Provide education and appropriate resources to help participant work on and attain dietary goals.;Weight Management/Obesity: Establish reasonable short term and long term weight goals.    Admit Weight  202 lb (91.6 kg)    Goal Weight: Short Term  198 lb (89.8 kg)    Goal Weight: Long Term  195 lb (88.5 kg)    Expected Outcomes  Short Term: Continue to assess and modify interventions until short term weight is achieved;Long Term: Adherence to  nutrition and physical activity/exercise program aimed toward attainment of established weight goal;Weight Maintenance: Understanding of the daily nutrition guidelines, which includes 25-35% calories from fat, 7% or less cal from saturated fats, less than 200mg  cholesterol, less than 1.5gm of sodium, & 5 or more servings of fruits and vegetables daily;Weight Loss: Understanding of general recommendations for a balanced deficit meal plan, which promotes 1-2 lb weight loss per week and includes a negative energy balance of [phone] kcal/d;Understanding recommendations for meals to include 15-35% energy as protein, 25-35% energy from fat, 35-60% energy from carbohydrates, less than 200mg  of dietary cholesterol, 20-35 gm of total fiber daily;Understanding of distribution of calorie intake throughout  the day with the consumption of 4-5 meals/snacks    Lipids  Yes    Intervention  Provide education and support for participant on nutrition & aerobic/resistive exercise along with prescribed medications to achieve LDL 70mg , HDL >40mg .    Expected Outcomes  Short Term: Participant states understanding of desired cholesterol values and is compliant with medications prescribed. Participant is following exercise prescription and nutrition guidelines.;Long Term: Cholesterol controlled with medications as prescribed, with individualized exercise RX and with personalized nutrition plan. Value goals: LDL < 70mg , HDL > 40 mg.       Tobacco Use Initial Evaluation: Social History   Tobacco Use  Smoking Status Former Smoker  . Packs/day: 1.00  . Years: 25.00  . Pack years: 25.00  . Types: Cigarettes  . Last attempt to quit: 06/30/1987  . Years since quitting: 30.8  Smokeless Tobacco Never Used    Exercise Goals and Review: Exercise Goals    Row Name 05/02/18 1501             Exercise Goals   Increase Physical Activity  Yes       Intervention  Provide advice, education, support and counseling about physical activity/exercise needs.;Develop an individualized exercise prescription for aerobic and resistive training based on initial evaluation findings, risk stratification, comorbidities and participant's personal goals.       Expected Outcomes  Short Term: Attend rehab on a regular basis to increase amount of physical activity.;Long Term: Add in home exercise to make exercise part of routine and to increase amount of physical activity.;Long Term: Exercising regularly at least 3-5 days a week.       Increase Strength and Stamina  Yes       Intervention  Provide advice, education, support and counseling about physical activity/exercise needs.;Develop an individualized exercise prescription for aerobic and resistive training based on initial evaluation findings, risk stratification, comorbidities and  participant's personal goals.       Expected Outcomes  Short Term: Increase workloads from initial exercise prescription for resistance, speed, and METs.;Short Term: Perform resistance training exercises routinely during rehab and add in resistance training at home;Long Term: Improve cardiorespiratory fitness, muscular endurance and strength as measured by increased METs and functional capacity (6MWT)       Able to understand and use rate of perceived exertion (RPE) scale  Yes       Intervention  Provide education and explanation on how to use RPE scale       Expected Outcomes  Short Term: Able to use RPE daily in rehab to express subjective intensity level;Long Term:  Able to use RPE to guide intensity level when exercising independently       Knowledge and understanding of Target Heart Rate Range (THRR)  Yes       Intervention  Provide education and explanation of  THRR including how the numbers were predicted and where they are located for reference       Expected Outcomes  Short Term: Able to state/look up THRR;Short Term: Able to use daily as guideline for intensity in rehab;Long Term: Able to use THRR to govern intensity when exercising independently       Able to check pulse independently  Yes       Intervention  Provide education and demonstration on how to check pulse in carotid and radial arteries.;Review the importance of being able to check your own pulse for safety during independent exercise       Expected Outcomes  Short Term: Able to explain why pulse checking is important during independent exercise;Long Term: Able to check pulse independently and accurately       Understanding of Exercise Prescription  Yes       Intervention  Provide education, explanation, and written materials on patient's individual exercise prescription       Expected Outcomes  Short Term: Able to explain program exercise prescription;Long Term: Able to explain home exercise prescription to exercise independently           Copy of goals given to participant.

## 2018-05-03 ENCOUNTER — Encounter: Payer: Medicare Other | Admitting: *Deleted

## 2018-05-03 DIAGNOSIS — Z7902 Long term (current) use of antithrombotics/antiplatelets: Secondary | ICD-10-CM | POA: Diagnosis not present

## 2018-05-03 DIAGNOSIS — Z951 Presence of aortocoronary bypass graft: Secondary | ICD-10-CM | POA: Diagnosis not present

## 2018-05-03 DIAGNOSIS — Z7982 Long term (current) use of aspirin: Secondary | ICD-10-CM | POA: Diagnosis not present

## 2018-05-03 DIAGNOSIS — Z79899 Other long term (current) drug therapy: Secondary | ICD-10-CM | POA: Diagnosis not present

## 2018-05-03 DIAGNOSIS — I2510 Atherosclerotic heart disease of native coronary artery without angina pectoris: Secondary | ICD-10-CM | POA: Diagnosis not present

## 2018-05-03 DIAGNOSIS — I252 Old myocardial infarction: Secondary | ICD-10-CM | POA: Diagnosis not present

## 2018-05-03 NOTE — Progress Notes (Signed)
Daily Session Note  Patient Details  Name: David Mccullough MRN: [id number] Date of Birth: 26-May-1946 Referring Provider:     Cardiac Rehab from 05/02/2018 in [facility]  Referring Provider  [name] MD [Local Cardiologist: [name], MD]      Encounter Date: 05/03/2018  Check In: Session Check In - 05/03/18 0935      Check-In   Supervising physician immediately available to respond to emergencies  See telemetry face sheet for immediately available ER MD    Location  [facility]-Cardiac & Pulmonary Rehab    Staff Present  Alberteen Sam, MA, RCEP, CCRP, Exercise Physiologist;Amanda Oletta Darter, BA, ACSM CEP, Exercise Physiologist;Susanne Bice, RN, BSN, CCRP   Jeanna Durrell   Medication changes reported      No    Fall or balance concerns reported     No    Warm-up and Cool-down  Performed on first and last piece of equipment    Resistance Training Performed  Yes    VAD Patient?  No    PAD/SET Patient?  No      Pain Assessment   Currently in Pain?  No/denies          Social History   Tobacco Use  Smoking Status Former Smoker  . Packs/day: 1.00  . Years: 25.00  . Pack years: 25.00  . Types: Cigarettes  . Last attempt to quit: 06/30/1987  . Years since quitting: 30.8  Smokeless Tobacco Never Used    Goals Met:  Independence with exercise equipment Exercise tolerated well No report of cardiac concerns or symptoms Strength training completed today  Goals Unmet:  Not Applicable  Comments: First full day of exercise!  Patient was oriented to gym and equipment including functions, settings, policies, and procedures.  Patient's individual exercise prescription and treatment plan were reviewed.  All starting workloads were established based on the results of the 6 minute walk test done at initial orientation visit.  The plan for exercise progression was also introduced and progression will be customized based on patient's performance and  goals.    Dr. Emily Filbert is Medical Director for [facility] and [name].

## 2018-05-04 ENCOUNTER — Encounter: Payer: Self-pay | Admitting: *Deleted

## 2018-05-04 DIAGNOSIS — Z951 Presence of aortocoronary bypass graft: Secondary | ICD-10-CM

## 2018-05-04 NOTE — Progress Notes (Signed)
Cardiac Individual Treatment Plan  Patient Details  Name: David Mccullough MRN: [id number] Date of Birth: April 14, 1946 Referring Provider:     Cardiac Rehab from 05/02/2018 in [facility] Cardiac and Pulmonary Rehab  Referring Provider  [name] MD [Local Cardiologist: [name], MD]      Initial Encounter Date:    Cardiac Rehab from 05/02/2018 in [facility] Cardiac and Pulmonary Rehab  Date  05/02/18      Visit Diagnosis: S/P CABG x 3  Patient's Home Medications on Admission:  Current Outpatient Medications:  .  amiodarone (PACERONE) 200 MG tablet, Take 1 tablet (200 mg total) by mouth daily. (Patient not taking: Reported on 05/02/2018), Disp: 60 tablet, Rfl: 1 .  aspirin 81 MG chewable tablet, Chew 81 mg by mouth at bedtime. , Disp: , Rfl:  .  atorvastatin (LIPITOR) 80 MG tablet, Take 1 tablet (80 mg total) by mouth daily at 6 PM., Disp: 30 tablet, Rfl: 1 .  Calcium Carb-Cholecalciferol (CALCIUM 1000 + D PO), Take 1 tablet by mouth daily. , Disp: , Rfl:  .  clopidogrel (PLAVIX) 75 MG tablet, Take 1 tablet (75 mg total) by mouth daily with breakfast., Disp: 20 tablet, Rfl: 0 .  furosemide (LASIX) 40 MG tablet, Take 1 tablet (40 mg total) by mouth daily as needed. only on days that you gain 3 pounds. Weigh yourself each morning, Disp: 30 tablet, Rfl: 0 .  Multiple Vitamin (MULTIVITAMIN) tablet, Take 1 tablet by mouth daily., Disp: , Rfl:  .  Omega-3 Fatty Acids (FISH OIL) 1200 MG CAPS, Take 1 capsule by mouth daily. , Disp: , Rfl:  .  potassium chloride SA (K-DUR,KLOR-CON) 20 MEQ tablet, Take 1 tablet (20 mEq total) by mouth daily as needed. Only on days that you take a dose of lasix, Disp: 30 tablet, Rfl: 0 .  spironolactone (ALDACTONE) 25 MG tablet, Take 1 tablet (25 mg total) by mouth daily., Disp: 30 tablet, Rfl: 0 .  traZODone (DESYREL) 50 MG tablet, Take 50 mg by mouth at bedtime. , Disp: , Rfl:   Past Medical History: Past Medical History:  Diagnosis Date  . CAD (coronary artery  disease)   . Colon polyps   . Depression   . Elevated PSA   . Myocardial infarction ([place])    x 2  . Persistent atrial fibrillation   . Tachycardia     Tobacco Use: Social History   Tobacco Use  Smoking Status Former Smoker  . Packs/day: 1.00  . Years: 25.00  . Pack years: 25.00  . Types: Cigarettes  . Last attempt to quit: 06/30/1987  . Years since quitting: 30.8  Smokeless Tobacco Never Used    Labs: Recent Review Flowsheet Data    Labs for ITP Cardiac and Pulmonary Rehab Latest Ref Rng & Units 03/27/2018 03/28/2018 03/29/2018 03/29/2018 03/30/2018   Cholestrol 0 - 200 mg/dL - - - - -   LDLCALC 0 - 99 mg/dL - - - - -   HDL >40 mg/dL - - - - -   Trlycerides <150 mg/dL - - - - -   Hemoglobin A1c 4.8 - 5.6 % - - - - -   PHART 7.350 - 7.450 - - - - -   PCO2ART 32.0 - 48.0 mmHg - - - - -   HCO3 20.0 - 28.0 mmol/L - - - - -   TCO2 22 - 32 mmol/L - - - - -   ACIDBASEDEF 0.0 - 2.0 mmol/L - - - - -  O2SAT % 54.1 56.6 49.4 48.3 51.2       Exercise Target Goals: Exercise Program Goal: Individual exercise prescription set using results from initial 6 min walk test and THRR while considering  patient's activity barriers and safety.   Exercise Prescription Goal: Initial exercise prescription builds to 30-45 minutes a day of aerobic activity, 2-3 days per week.  Home exercise guidelines will be given to patient during program as part of exercise prescription that the participant will acknowledge.  Activity Barriers & Risk Stratification: Activity Barriers & Cardiac Risk Stratification - 05/02/18 1347      Activity Barriers & Cardiac Risk Stratification   Activity Barriers  Deconditioning;Muscular Weakness    Cardiac Risk Stratification  High       6 Minute Walk: 6 Minute Walk    Row Name 05/02/18 1456         6 Minute Walk   Phase  Initial     Distance  1264 feet     Walk Time  6 minutes     MPH  2.39     METS  2.59     RPE  11     VO2 Peak  9.06     Symptoms  No      Resting HR  76 bpm     Resting BP  136/66     Resting Oxygen Saturation   100 %     Exercise Oxygen Saturation  during 6 min walk  98 %     Max Ex. HR  81 bpm     Max Ex. BP  144/70     2 Minute Post BP  136/62        Oxygen Initial Assessment:   Oxygen Re-Evaluation:   Oxygen Discharge (Final Oxygen Re-Evaluation):   Initial Exercise Prescription: Initial Exercise Prescription - 05/02/18 1400      Date of Initial Exercise RX and Referring Provider   Date  05/02/18    Referring Provider  [name] MD   Local Cardiologist: [name], MD     Treadmill   MPH  1.9    Grade  0.5    Minutes  15    METs  2.59      REL-XR   Level  1    Speed  50    Minutes  15    METs  2.5      T5 Nustep   Level  1    SPM  80    Minutes  15    METs  2.5      Prescription Details   Frequency (times per week)  2    Duration  Progress to 30 minutes of continuous aerobic without signs/symptoms of physical distress      Intensity   THRR 40-80% of Max Heartrate  104-133    Ratings of Perceived Exertion  11-13    Perceived Dyspnea  0-4      Progression   Progression  Continue to progress workloads to maintain intensity without signs/symptoms of physical distress.      Resistance Training   Training Prescription  Yes    Weight  4 lbs    Reps  10-15       Perform Capillary Blood Glucose checks as needed.  Exercise Prescription Changes: Exercise Prescription Changes    Row Name 05/02/18 1300             Response to Exercise   Blood Pressure (Admit)  136/66  Blood Pressure (Exercise)  144/70       Blood Pressure (Exit)  136/62       Heart Rate (Admit)  76 bpm       Heart Rate (Exercise)  81 bpm       Heart Rate (Exit)  74 bpm       Oxygen Saturation (Admit)  100 %       Oxygen Saturation (Exercise)  98 %       Rating of Perceived Exertion (Exercise)  11       Symptoms  none       Comments  walk test results          Exercise  Comments: Exercise Comments    Row Name 05/03/18 1109           Exercise Comments  First full day of exercise!  Patient was oriented to gym and equipment including functions, settings, policies, and procedures.  Patient's individual exercise prescription and treatment plan were reviewed.  All starting workloads were established based on the results of the 6 minute walk test done at initial orientation visit.  The plan for exercise progression was also introduced and progression will be customized based on patient's performance and goals.          Exercise Goals and Review: Exercise Goals    Row Name 05/02/18 1501             Exercise Goals   Increase Physical Activity  Yes       Intervention  Provide advice, education, support and counseling about physical activity/exercise needs.;Develop an individualized exercise prescription for aerobic and resistive training based on initial evaluation findings, risk stratification, comorbidities and participant's personal goals.       Expected Outcomes  Short Term: Attend rehab on a regular basis to increase amount of physical activity.;Long Term: Add in home exercise to make exercise part of routine and to increase amount of physical activity.;Long Term: Exercising regularly at least 3-5 days a week.       Increase Strength and Stamina  Yes       Intervention  Provide advice, education, support and counseling about physical activity/exercise needs.;Develop an individualized exercise prescription for aerobic and resistive training based on initial evaluation findings, risk stratification, comorbidities and participant's personal goals.       Expected Outcomes  Short Term: Increase workloads from initial exercise prescription for resistance, speed, and METs.;Short Term: Perform resistance training exercises routinely during rehab and add in resistance training at home;Long Term: Improve cardiorespiratory fitness, muscular endurance and strength as  measured by increased METs and functional capacity (6MWT)       Able to understand and use rate of perceived exertion (RPE) scale  Yes       Intervention  Provide education and explanation on how to use RPE scale       Expected Outcomes  Short Term: Able to use RPE daily in rehab to express subjective intensity level;Long Term:  Able to use RPE to guide intensity level when exercising independently       Knowledge and understanding of Target Heart Rate Range (THRR)  Yes       Intervention  Provide education and explanation of THRR including how the numbers were predicted and where they are located for reference       Expected Outcomes  Short Term: Able to state/look up THRR;Short Term: Able to use daily as guideline for intensity in rehab;Long Term: Able to use THRR  to govern intensity when exercising independently       Able to check pulse independently  Yes       Intervention  Provide education and demonstration on how to check pulse in carotid and radial arteries.;Review the importance of being able to check your own pulse for safety during independent exercise       Expected Outcomes  Short Term: Able to explain why pulse checking is important during independent exercise;Long Term: Able to check pulse independently and accurately       Understanding of Exercise Prescription  Yes       Intervention  Provide education, explanation, and written materials on patient's individual exercise prescription       Expected Outcomes  Short Term: Able to explain program exercise prescription;Long Term: Able to explain home exercise prescription to exercise independently          Exercise Goals Re-Evaluation : Exercise Goals Re-Evaluation    Row Name 05/03/18 1109             Exercise Goal Re-Evaluation   Exercise Goals Review  Increase Physical Activity;Increase Strength and Stamina;Able to understand and use rate of perceived exertion (RPE) scale;Knowledge and understanding of Target Heart Rate Range  (THRR);Understanding of Exercise Prescription       Comments  Reviewed RPE scale, THR and program prescription with pt today.  Pt voiced understanding and was given a copy of goals to take home.        Expected Outcomes  Short: Use RPE daily to regulate intensity. Long: Follow program prescription in THR.          Discharge Exercise Prescription (Final Exercise Prescription Changes): Exercise Prescription Changes - 05/02/18 1300      Response to Exercise   Blood Pressure (Admit)  136/66    Blood Pressure (Exercise)  144/70    Blood Pressure (Exit)  136/62    Heart Rate (Admit)  76 bpm    Heart Rate (Exercise)  81 bpm    Heart Rate (Exit)  74 bpm    Oxygen Saturation (Admit)  100 %    Oxygen Saturation (Exercise)  98 %    Rating of Perceived Exertion (Exercise)  11    Symptoms  none    Comments  walk test results       Nutrition:  Target Goals: Understanding of nutrition guidelines, daily intake of sodium <1512m, cholesterol <2028m calories 30% from fat and 7% or less from saturated fats, daily to have 5 or more servings of fruits and vegetables.  Biometrics: Pre Biometrics - 05/02/18 1502      Pre Biometrics   Height  5' 11.1" (1.806 m)    Weight  202 lb 8 oz (91.9 kg)    Waist Circumference  40 inches    Hip Circumference  38 inches    Waist to Hip Ratio  1.05 %    BMI (Calculated)  28.16    Single Leg Stand  8.07 seconds        Nutrition Therapy Plan and Nutrition Goals: Nutrition Therapy & Goals - 05/03/18 1108      Nutrition Therapy   Diet  TLC    Drug/Food Interactions  Statins/Certain Fruits    Protein (specify units)  9oz    Fiber  30 grams    Whole Grain Foods  3 servings   chooses whole grain bread   Saturated Fats  15 max. grams    Fruits and Vegetables  5 servings/day  8 ideal; eats more vegetables than fruits   Sodium  1500 grams      Personal Nutrition Goals   Nutrition Goal  Include at least one additional serving of fish per week     Personal Goal #2  Try breakfast options that are higher in fiber and lower in saturated/trans fats. For example, swap your sausage + egg biscuit for canadian bacon + egg english muffin or whole grain bread sandwich or choosing oatmeal instead.     Comments  He feels that there are some areas of his diet that he and his wife couple improve, particularly with the types of fats and proteins they eat. His usual breakfast is cereal or a sausage and egg biscuit sandwich. For lunch he eats sandwiches on wheat bread or salads. For dinners they eat at home 3 days/week and eat out 4 days/week typically. At home his wife cooks chicken or occasionally steak with vegetables / salad and sometimes starches like baked idaho/sweet potatoes or pasta. If eating out they try to choose restaurants rather than fast food; recently [place] and [employer]. He does not add salt to meals post-preparation. Beverages: water, juice, diet soda, non-fat milk. They do not typically buy canned foods or pre-packaged meals. Snack foods include popcorn, cheese crackers, or yogurt.      Intervention Plan   Intervention  Prescribe, educate and counsel regarding individualized specific dietary modifications aiming towards targeted core components such as weight, hypertension, lipid management, diabetes, heart failure and other comorbidities.;Nutrition handout(s) given to patient.   General heart healthy eating powerpoint packet   Expected Outcomes  Short Term Goal: A plan has been developed with personal nutrition goals set during dietitian appointment.;Long Term Goal: Adherence to prescribed nutrition plan.;Short Term Goal: Understand basic principles of dietary content, such as calories, fat, sodium, cholesterol and nutrients.       Nutrition Assessments: Nutrition Assessments - 05/02/18 1346      MEDFICTS Scores   Pre Score  28       Nutrition Goals Re-Evaluation:   Nutrition Goals Discharge (Final Nutrition Goals  Re-Evaluation):   Psychosocial: Target Goals: Acknowledge presence or absence of significant depression and/or stress, maximize coping skills, provide positive support system. Participant is able to verbalize types and ability to use techniques and skills needed for reducing stress and depression.   Initial Review & Psychosocial Screening: Initial Psych Review & Screening - 05/02/18 1342      Initial Review   Current issues with  Current Stress Concerns;History of Depression    Source of Stress Concerns  Family    Comments  Mr. Ellinwood son struggles with Bipolar. He is currently homeless, in and out of the hospital and on and off his meds. Mr. Cull and his wife reported this is a major stressful for their family. Mrs. Roarty expressed concern for her husband that before they moved from [facility] Mr. Skeels was seeing a psychiatrist. His psychiatrist had taken him off his depression medications because he was doing so well. They both are concerned he may need to go back on them since his heart surgery and recent events with his son. They were encouraged to reach out to his past psychiatrist to either see him or be referred to someone local.       Terrell?  Yes   wife, friends     Barriers   Psychosocial barriers to participate in program  The patient should benefit from training in stress management  and relaxation.      Screening Interventions   Interventions  Encouraged to exercise;Program counselor consult;To provide support and resources with identified psychosocial needs;Provide feedback about the scores to participant    Expected Outcomes  Short Term goal: Utilizing psychosocial counselor, staff and physician to assist with identification of specific Stressors or current issues interfering with healing process. Setting desired goal for each stressor or current issue identified.;Long Term Goal: Stressors or current issues are controlled or eliminated.;Short Term  goal: Identification and review with participant of any Quality of Life or Depression concerns found by scoring the questionnaire.;Long Term goal: The participant improves quality of Life and PHQ9 Scores as seen by post scores and/or verbalization of changes       Quality of Life Scores:  Quality of Life - 05/02/18 1346      Quality of Life   Select  Quality of Life      Quality of Life Scores   Health/Function Pre  15.85 %    Socioeconomic Pre  24 %    Psych/Spiritual Pre  17.07 %    Family Pre  16.8 %    GLOBAL Pre  17.85 %      Scores of 19 and below usually indicate a poorer quality of life in these areas.  A difference of  2-3 points is a clinically meaningful difference.  A difference of 2-3 points in the total score of the Quality of Life Index has been associated with significant improvement in overall quality of life, self-image, physical symptoms, and general health in studies assessing change in quality of life.  PHQ-9: Recent Review Flowsheet Data    Depression screen [facility] 2/9 05/02/2018 04/14/2018   Decreased Interest 1 0   Down, Depressed, Hopeless 2 0   PHQ - 2 Score 3 0   Altered sleeping 0 -   Tired, decreased energy 1 -   Change in appetite 1 -   Feeling bad or failure about yourself  0 -   Trouble concentrating 0 -   Moving slowly or fidgety/restless 0 -   Suicidal thoughts 1  -   PHQ-9 Score 6 -   Difficult doing work/chores Somewhat difficult -     Interpretation of Total Score  Total Score Depression Severity:  1-4 = Minimal depression, 5-9 = Mild depression, 10-14 = Moderate depression, 15-19 = Moderately severe depression, 20-27 = Severe depression   Psychosocial Evaluation and Intervention:   Psychosocial Re-Evaluation:   Psychosocial Discharge (Final Psychosocial Re-Evaluation):   Vocational Rehabilitation: Provide vocational rehab assistance to qualifying candidates.   Vocational Rehab Evaluation & Intervention: Vocational Rehab -  05/02/18 1342      Initial Vocational Rehab Evaluation & Intervention   Assessment shows need for Vocational Rehabilitation  No       Education: Education Goals: Education classes will be provided on a variety of topics geared toward better understanding of heart health and risk factor modification. Participant will state understanding/return demonstration of topics presented as noted by education test scores.  Learning Barriers/Preferences: Learning Barriers/Preferences - 05/02/18 1339      Learning Barriers/Preferences   Learning Barriers  None    Learning Preferences  None       Education Topics:  AED/CPR: - Group verbal and written instruction with the use of models to demonstrate the basic use of the AED with the basic ABC's of resuscitation.   General Nutrition Guidelines/Fats and Fiber: -Group instruction provided by verbal, written material, models and posters to present  the general guidelines for heart healthy nutrition. Gives an explanation and review of dietary fats and fiber.   Cardiac Rehab from 05/03/2018 in [facility] Cardiac and Pulmonary Rehab  Date  05/03/18  Educator  LB  Instruction Review Code  1- Verbalizes Understanding      Controlling Sodium/Reading Food Labels: -Group verbal and written material supporting the discussion of sodium use in heart healthy nutrition. Review and explanation with models, verbal and written materials for utilization of the food label.   Exercise Physiology & General Exercise Guidelines: - Group verbal and written instruction with models to review the exercise physiology of the cardiovascular system and associated critical values. Provides general exercise guidelines with specific guidelines to those with heart or lung disease.    Aerobic Exercise & Resistance Training: - Gives group verbal and written instruction on the various components of exercise. Focuses on aerobic and resistive training programs and the benefits of this  training and how to safely progress through these programs..   Flexibility, Balance, Mind/Body Relaxation: Provides group verbal/written instruction on the benefits of flexibility and balance training, including mind/body exercise modes such as yoga, pilates and tai chi.  Demonstration and skill practice provided.   Stress and Anxiety: - Provides group verbal and written instruction about the health risks of elevated stress and causes of high stress.  Discuss the correlation between heart/lung disease and anxiety and treatment options. Review healthy ways to manage with stress and anxiety.   Depression: - Provides group verbal and written instruction on the correlation between heart/lung disease and depressed mood, treatment options, and the stigmas associated with seeking treatment.   Anatomy & Physiology of the Heart: - Group verbal and written instruction and models provide basic cardiac anatomy and physiology, with the coronary electrical and arterial systems. Review of Valvular disease and Heart Failure   Cardiac Procedures: - Group verbal and written instruction to review commonly prescribed medications for heart disease. Reviews the medication, class of the drug, and side effects. Includes the steps to properly store meds and maintain the prescription regimen. (beta blockers and nitrates)   Cardiac Medications I: - Group verbal and written instruction to review commonly prescribed medications for heart disease. Reviews the medication, class of the drug, and side effects. Includes the steps to properly store meds and maintain the prescription regimen.   Cardiac Medications II: -Group verbal and written instruction to review commonly prescribed medications for heart disease. Reviews the medication, class of the drug, and side effects. (all other drug classes)    Go Sex-Intimacy & Heart Disease, Get SMART - Goal Setting: - Group verbal and written instruction through game format  to discuss heart disease and the return to sexual intimacy. Provides group verbal and written material to discuss and apply goal setting through the application of the S.M.A.R.T. Method.   Other Matters of the Heart: - Provides group verbal, written materials and models to describe Stable Angina and Peripheral Artery. Includes description of the disease process and treatment options available to the cardiac patient.   Exercise & Equipment Safety: - Individual verbal instruction and demonstration of equipment use and safety with use of the equipment.   Cardiac Rehab from 05/03/2018 in [facility] Cardiac and Pulmonary Rehab  Date  05/02/18  Educator  [facility]  Instruction Review Code  1- Verbalizes Understanding      Infection Prevention: - Provides verbal and written material to individual with discussion of infection control including proper hand washing and proper equipment cleaning during exercise session.  Cardiac Rehab from 05/03/2018 in [facility] Cardiac and Pulmonary Rehab  Date  05/02/18  Educator  [facility]  Instruction Review Code  1- Verbalizes Understanding      Falls Prevention: - Provides verbal and written material to individual with discussion of falls prevention and safety.   Cardiac Rehab from 05/03/2018 in [facility] Cardiac and Pulmonary Rehab  Date  05/02/18  Educator  [facility]  Instruction Review Code  1- Verbalizes Understanding      Diabetes: - Individual verbal and written instruction to review signs/symptoms of diabetes, desired ranges of glucose level fasting, after meals and with exercise. Acknowledge that pre and post exercise glucose checks will be done for 3 sessions at entry of program.   Know Your Numbers and Risk Factors: -Group verbal and written instruction about important numbers in your health.  Discussion of what are risk factors and how they play a role in the disease process.  Review of Cholesterol, Blood Pressure, Diabetes, and BMI and the role they play in your overall  health.   Sleep Hygiene: -Provides group verbal and written instruction about how sleep can affect your health.  Define sleep hygiene, discuss sleep cycles and impact of sleep habits. Review good sleep hygiene tips.    Other: -Provides group and verbal instruction on various topics (see comments)   Knowledge Questionnaire Score: Knowledge Questionnaire Score - 05/02/18 1339      Knowledge Questionnaire Score   Pre Score  22/26   correct answers reviewed with Ardyth Gal, focus on angina, exercise      Core Components/Risk Factors/Patient Goals at Admission: Personal Goals and Risk Factors at Admission - 05/02/18 1338      Core Components/Risk Factors/Patient Goals on Admission    Weight Management  Yes;Weight Loss    Intervention  Weight Management: Develop a combined nutrition and exercise program designed to reach desired caloric intake, while maintaining appropriate intake of nutrient and fiber, sodium and fats, and appropriate energy expenditure required for the weight goal.;Weight Management: Provide education and appropriate resources to help participant work on and attain dietary goals.;Weight Management/Obesity: Establish reasonable short term and long term weight goals.    Admit Weight  202 lb (91.6 kg)    Goal Weight: Short Term  198 lb (89.8 kg)    Goal Weight: Long Term  195 lb (88.5 kg)    Expected Outcomes  Short Term: Continue to assess and modify interventions until short term weight is achieved;Long Term: Adherence to nutrition and physical activity/exercise program aimed toward attainment of established weight goal;Weight Maintenance: Understanding of the daily nutrition guidelines, which includes 25-35% calories from fat, 7% or less cal from saturated fats, less than 277m cholesterol, less than 1.5gm of sodium, & 5 or more servings of fruits and vegetables daily;Weight Loss: Understanding of general recommendations for a balanced deficit meal plan, which promotes 1-2 lb  weight loss per week and includes a negative energy balance of [phone] kcal/d;Understanding recommendations for meals to include 15-35% energy as protein, 25-35% energy from fat, 35-60% energy from carbohydrates, less than 2034mof dietary cholesterol, 20-35 gm of total fiber daily;Understanding of distribution of calorie intake throughout the day with the consumption of 4-5 meals/snacks    Lipids  Yes    Intervention  Provide education and support for participant on nutrition & aerobic/resistive exercise along with prescribed medications to achieve LDL <703mHDL >58m59m  Expected Outcomes  Short Term: Participant states understanding of desired cholesterol values and is compliant with medications prescribed. Participant  is following exercise prescription and nutrition guidelines.;Long Term: Cholesterol controlled with medications as prescribed, with individualized exercise RX and with personalized nutrition plan. Value goals: LDL < 35m, HDL > 40 mg.       Core Components/Risk Factors/Patient Goals Review:    Core Components/Risk Factors/Patient Goals at Discharge (Final Review):    ITP Comments: ITP Comments    Row Name 05/02/18 1327 05/04/18 0608         ITP Comments  Med Review completed. Initial ITP created. [facility]  [facility]. Continue with ITP unless direccted changes per Medical Director Chart Review. New to program         Comments:

## 2018-05-04 NOTE — Progress Notes (Signed)
Advanced Heart Failure Clinic Note   PCP: Patient, No Pcp Per PCP-Cardiologist: Dr Haroldine Laws  HPI:  David Mccullough is a 72 y.o. male with h/o CKD 3, PAF and CAD recently admitted with NSTEM. Had unsuccessful PCI and then underwent CABG 03/22/18. Post-op course c/b Torsades and persistent volume overload.  Admitted 9/16-10/07/2017 who presented to [facility] with CP and ruled in for NSTEMI. LHC showed 3v disease. PCI attempted, but was unsuccessful. Transferred to [facility] on 9/18 for CABG evaluation. Dr Prescott Gum performed CABG x 3 on 03/22/18. He had an episodes of torsades post op that resolved with lidocaine and magnesium. He had low co-ox and volume overload, so AHF team ws consulted. Milrinone was weaned as able and he was diuresed with IV lasix. Coox on day of DC was marginal at 51%. Transitioned to jardiance with lasix PRN at DC. He was in afib on admit and amiodarone was reloaded. Decreased to amio 200 mg BID on 9/29.  DC weight: 200 lbs.   He returns today for HF follow up. Last visit spiro was increased and amio was decreased. Overall doing great. Has started cardiac rehab. Denies SOB. No orthopnea, PND, or edema. No CP or dizziness. Appetite and energy level improving. He gets fatigued after working out at cardiac rehab. Has not used any PRN lasix. Weights stable at home 193-195 lbs. Limits fluid and salt. Takes all medications. He is hopeful to be discharged from HF clinic and follow up with his primary cardiologist.    FH: mother and father with CAD  SH: former smoker 1 ppd x 25 years, quit in 1989, no ETOH or drug use  Echo 03/24/2018   - Limited study, however, there is no significant change since the last study on 03/22/2018, LVEF reamins normal at 55-60% with no regional wall motion abnormalities. -Left ventricle: The cavity size was normal. There was mild concentric hypertrophy. Systolic function was normal. The estimated ejection fraction was in the range of 55% to 60%.  Wall motion was normal; there were no regional wall motion abnormalities. Left ventricular diastolic function parameters were normal. - Mitral valve: There was mild regurgitation. Valve area by pressure half-time: 1.98 cm^2. - Left atrium: The atrium was moderately dilated. - Right ventricle: The cavity size was normal. Wall thickness was normal. Systolic function was normal. - Pulmonary arteries: Systolic pressure was mildly increased. PA peak pressure: 37 mm Hg (S). - Pericardium, extracardiac: A mild pericardial effusion was identified lateral to the left ventricle. Features were not consistent with tamponade physiology.  ECHO 03/22/2018 - Left ventricle: The cavity size was normal. Wall thickness was normal. Systolic function was normal. The estimated ejection fraction was in the range of 55% to 60%. Wall motion was normal; there were no regional wall motion abnormalities. The study is not technically sufficient to allow evaluation of LV diastolic function. - Mitral valve: There was mild regurgitation. - Right ventricle: Measured TAPSE and tissue anular velocity would suggest RV systolic function is moderately decreased. - Right atrium: Poorly visualized. Grossly appears enlarged. - Pericardium, extracardiac: There is no clear pericardial effusion. The pericardium adjacent to the RA and RV is poorly visualized. From what is visualized there is no clear effusion, no evidence of RA or RV collapse. - Technically difficult and limited study. Echocontrast was used to enhance visualization.  Review of systems complete and found to be negative unless listed in HPI.   Past Medical History:  Diagnosis Date  . CAD (coronary artery disease)   . Colon  polyps   . Depression   . Elevated PSA   . Myocardial infarction ([place])    x 2  . Persistent atrial fibrillation   . Tachycardia     Current Outpatient Medications  Medication Sig Dispense  Refill  . amiodarone (PACERONE) 200 MG tablet Take 1 tablet (200 mg total) by mouth daily. 60 tablet 1  . aspirin 81 MG chewable tablet Chew 81 mg by mouth at bedtime.     Marland Kitchen atorvastatin (LIPITOR) 80 MG tablet Take 1 tablet (80 mg total) by mouth daily at 6 PM. 30 tablet 1  . Calcium Carb-Cholecalciferol (CALCIUM 1000 + D PO) Take 1 tablet by mouth daily.     . clopidogrel (PLAVIX) 75 MG tablet Take 1 tablet (75 mg total) by mouth daily with breakfast. 20 tablet 0  . furosemide (LASIX) 40 MG tablet Take 1 tablet (40 mg total) by mouth daily as needed. only on days that you gain 3 pounds. Weigh yourself each morning 30 tablet 0  . Multiple Vitamin (MULTIVITAMIN) tablet Take 1 tablet by mouth daily.    . Omega-3 Fatty Acids (FISH OIL) 1200 MG CAPS Take 1 capsule by mouth daily.     . potassium chloride SA (K-DUR,KLOR-CON) 20 MEQ tablet Take 1 tablet (20 mEq total) by mouth daily as needed. Only on days that you take a dose of lasix 30 tablet 0  . spironolactone (ALDACTONE) 25 MG tablet Take 1 tablet (25 mg total) by mouth daily. 30 tablet 0  . traZODone (DESYREL) 50 MG tablet Take 50 mg by mouth at bedtime.      No current facility-administered medications for this encounter.     No Known Allergies    Social History   Socioeconomic History  . Marital status: Unknown    Spouse name: Not on file  . Number of children: Not on file  . Years of education: Not on file  . Highest education level: Not on file  Occupational History  . Not on file  Social Needs  . Financial resource strain: Not hard at all  . Food insecurity:    Worry: Never true    Inability: Never true  . Transportation needs:    Medical: No    Non-medical: No  Tobacco Use  . Smoking status: Former Smoker    Packs/day: 1.00    Years: 25.00    Pack years: 25.00    Types: Cigarettes    Last attempt to quit: 06/30/1987    Years since quitting: 30.8  . Smokeless tobacco: Never Used  Substance and Sexual Activity  .  Alcohol use: Not Currently  . Drug use: Not Currently  . Sexual activity: Not Currently  Lifestyle  . Physical activity:    Days per week: Not on file    Minutes per session: Not on file  . Stress: Not on file  Relationships  . Social connections:    Talks on phone: Not on file    Gets together: Not on file    Attends religious service: Not on file    Active member of club or organization: Not on file    Attends meetings of clubs or organizations: Not on file    Relationship status: Not on file  . Intimate partner violence:    Fear of current or ex partner: Not on file    Emotionally abused: Not on file    Physically abused: Not on file    Forced sexual activity: Not on file  Other  Topics Concern  . Not on file  Social History Narrative  . Not on file      Family History  Problem Relation Age of Onset  . Heart disease Mother   . Heart disease Father   . Arrhythmia Sister   . Heart disease Sister     Vitals:   05/05/18 1131  BP: 118/66  Pulse: 70  SpO2: 97%  Weight: 92.4 kg (203 lb 9.6 oz)   Wt Readings from Last 3 Encounters:  05/05/18 92.4 kg (203 lb 9.6 oz)  05/02/18 91.9 kg (202 lb 8 oz)  04/25/18 91.2 kg (201 lb)     PHYSICAL EXAM: General:  Well appearing. No respiratory difficulty HEENT: normal Neck: supple. JVP flat. Carotids 2+ bilat; no bruits. No lymphadenopathy or thyromegaly appreciated. Cor: PMI nondisplaced. Regular rate & rhythm. No rubs, gallops or murmurs. Midsternal incision CDI Lungs: clear Abdomen: soft, nontender, nondistended. No hepatosplenomegaly. No bruits or masses. Good bowel sounds. Extremities: no cyanosis, clubbing, rash, edema Neuro: alert & oriented x 3, cranial nerves grossly intact. moves all 4 extremities w/o difficulty. Affect pleasant.  ECG: NSR with 1st degree AV block, 71 bpm. Personally reviewed.    ASSESSMENT & PLAN:  1.CAD s/p CABG x3 03/22/18 - No s/s ischemia. - Continue ASA/statin  2. Chronic diastolic  HF - ECHO 1/54MGQQ normal. Limted ECHO on 9/26 showed LVEF 55-60% with normal RV (echo reviewed and EF probably more in the 45-50% range). Required milrinone while inpatient with marginal co-ox off milrinone.  - NYHA I-II - Volume status stable on exam despite 5 lb weight gain. - Continue Lasix 40 mg PRN. He has not taken.  - Continue spiro to 25 mg daily.  - Continue cardiac rehab.  - He has a cardiologist in [place] and would like to eventually be discharged from HF clinic back to him due to location convenience. He sees his cardiologist (Dr Clayborn Bigness at [facility]) in January. I will make follow up in 3 months with Dr Haroldine Laws.   3. PAF - EKG today shows NSR. Stop amiodarone today. Discussed with Dr Haroldine Laws last visit. - No AC if remains in NSR   4. Anemia  - Post op. Resolved prior to DC  5. Torsades [place]  - Required lidocaine drip 9/26-9/27/19  6. CKD Stage III - Labs stable 10/28 with creatinine 1.25, K 4.2  7. DM2 - A1C 5.2 02/2018. Follows with PCP - He is not interested in [place].   He has an appointment with his cardiologist in January and may have him resume all HF care.  Schedule follow up in 3 months with Dr Haroldine Laws.   Georgiana Shore, NP 05/05/18  Greater than 50% of the 25 minute visit was spent in counseling/coordination of care regarding disease state education, salt/fluid restriction, sliding scale diuretics, and medication compliance.

## 2018-05-05 ENCOUNTER — Other Ambulatory Visit: Payer: Self-pay

## 2018-05-05 ENCOUNTER — Encounter (HOSPITAL_COMMUNITY): Payer: Self-pay

## 2018-05-05 ENCOUNTER — Ambulatory Visit (HOSPITAL_COMMUNITY)
Admission: RE | Admit: 2018-05-05 | Discharge: 2018-05-05 | Disposition: A | Discharge status: Home / Self Care | Payer: Medicare Other | Source: Ambulatory Visit | Attending: Cardiology | Admitting: Cardiology

## 2018-05-05 VITALS — BP 118/66 | HR 70 | Wt 203.6 lb

## 2018-05-05 DIAGNOSIS — Z79899 Other long term (current) drug therapy: Secondary | ICD-10-CM | POA: Diagnosis not present

## 2018-05-05 DIAGNOSIS — I2510 Atherosclerotic heart disease of native coronary artery without angina pectoris: Secondary | ICD-10-CM | POA: Insufficient documentation

## 2018-05-05 DIAGNOSIS — Z87891 Personal history of nicotine dependence: Secondary | ICD-10-CM | POA: Diagnosis not present

## 2018-05-05 DIAGNOSIS — N183 Chronic kidney disease, stage 3 unspecified: Secondary | ICD-10-CM

## 2018-05-05 DIAGNOSIS — Z951 Presence of aortocoronary bypass graft: Secondary | ICD-10-CM | POA: Insufficient documentation

## 2018-05-05 DIAGNOSIS — Z8249 Family history of ischemic heart disease and other diseases of the circulatory system: Secondary | ICD-10-CM | POA: Diagnosis not present

## 2018-05-05 DIAGNOSIS — I5032 Chronic diastolic (congestive) heart failure: Secondary | ICD-10-CM | POA: Diagnosis not present

## 2018-05-05 DIAGNOSIS — Z7982 Long term (current) use of aspirin: Secondary | ICD-10-CM | POA: Insufficient documentation

## 2018-05-05 DIAGNOSIS — D649 Anemia, unspecified: Secondary | ICD-10-CM | POA: Insufficient documentation

## 2018-05-05 DIAGNOSIS — I480 Paroxysmal atrial fibrillation: Secondary | ICD-10-CM | POA: Insufficient documentation

## 2018-05-05 DIAGNOSIS — I252 Old myocardial infarction: Secondary | ICD-10-CM | POA: Diagnosis not present

## 2018-05-05 DIAGNOSIS — E1122 Type 2 diabetes mellitus with diabetic chronic kidney disease: Secondary | ICD-10-CM | POA: Insufficient documentation

## 2018-05-05 DIAGNOSIS — Z7902 Long term (current) use of antithrombotics/antiplatelets: Secondary | ICD-10-CM | POA: Diagnosis not present

## 2018-05-05 NOTE — Patient Instructions (Signed)
STOP Amiodarone   Your physician recommends that you schedule a follow-up appointment in: 3 months with Dr Haroldine Laws  Do the following things EVERYDAY: 1) Weigh yourself in the morning before breakfast. Write it down and keep it in a log. 2) Take your medicines as prescribed 3) Eat low salt foods-Limit salt (sodium) to 2000 mg per day.  4) Stay as active as you can everyday 5) Limit all fluids for the day to less than 2 liters

## 2018-05-05 NOTE — Progress Notes (Signed)
Daily Session Note  Patient Details  Name: David Mccullough MRN: [id number] Date of Birth: Apr 18, 1946 Referring Provider:     Cardiac Rehab from 05/02/2018 in [facility] Cardiac and Pulmonary Rehab  Referring Provider  [name] MD [Local Cardiologist: [name], MD]      Encounter Date: 05/05/2018  Check In: Session Check In - 05/05/18 1627      Check-In   Supervising physician immediately available to respond to emergencies  See telemetry face sheet for immediately available ER MD    Location  [facility]-Cardiac & Pulmonary Rehab    Staff Present  Renita Papa, RN BSN;Joseph [address] [place], [place], ACSM CEP, Exercise Physiologist    Medication changes reported      No    Fall or balance concerns reported     No    Warm-up and Cool-down  Performed on first and last piece of equipment    Resistance Training Performed  Yes    VAD Patient?  No    PAD/SET Patient?  No      Pain Assessment   Currently in Pain?  No/denies          Social History   Tobacco Use  Smoking Status Former Smoker  . Packs/day: 1.00  . Years: 25.00  . Pack years: 25.00  . Types: Cigarettes  . Last attempt to quit: 06/30/1987  . Years since quitting: 30.8  Smokeless Tobacco Never Used    Goals Met:  Independence with exercise equipment Exercise tolerated well No report of cardiac concerns or symptoms Strength training completed today  Goals Unmet:  Not Applicable  Comments: Pt able to follow exercise prescription today without complaint.  Will continue to monitor for progression.    Dr. Emily Filbert is Medical Director for [place] and [name].

## 2018-05-10 ENCOUNTER — Encounter: Payer: Medicare Other | Admitting: *Deleted

## 2018-05-10 ENCOUNTER — Other Ambulatory Visit: Payer: Self-pay | Admitting: Surgical

## 2018-05-10 DIAGNOSIS — Z7902 Long term (current) use of antithrombotics/antiplatelets: Secondary | ICD-10-CM | POA: Diagnosis not present

## 2018-05-10 DIAGNOSIS — Z79899 Other long term (current) drug therapy: Secondary | ICD-10-CM | POA: Diagnosis not present

## 2018-05-10 DIAGNOSIS — Z7982 Long term (current) use of aspirin: Secondary | ICD-10-CM | POA: Diagnosis not present

## 2018-05-10 DIAGNOSIS — Z951 Presence of aortocoronary bypass graft: Secondary | ICD-10-CM

## 2018-05-10 DIAGNOSIS — I252 Old myocardial infarction: Secondary | ICD-10-CM | POA: Diagnosis not present

## 2018-05-10 DIAGNOSIS — I2510 Atherosclerotic heart disease of native coronary artery without angina pectoris: Secondary | ICD-10-CM | POA: Diagnosis not present

## 2018-05-10 NOTE — Progress Notes (Signed)
Daily Session Note  Patient Details  Name: David Mccullough MRN: [id number] Date of Birth: 05-29-1946 Referring Provider:     Cardiac Rehab from 05/02/2018 in [facility] Cardiac and Pulmonary Rehab  Referring Provider  [name] MD [Local Cardiologist: [name], MD]      Encounter Date: 05/10/2018  Check In: Session Check In - 05/10/18 0846      Check-In   Supervising physician immediately available to respond to emergencies  See telemetry face sheet for immediately available ER MD    Location  [facility]-Cardiac & Pulmonary Rehab    Staff Present  Alberteen Sam, MA, RCEP, CCRP, Exercise Physiologist;Amanda Oletta Darter, BA, ACSM CEP, Exercise Physiologist;Other;Carroll Enterkin, RN, BSN   Eliezer Lofts Durrell   Medication changes reported      No    Fall or balance concerns reported     No    Warm-up and Cool-down  Performed on first and last piece of equipment    Resistance Training Performed  Yes    VAD Patient?  No    PAD/SET Patient?  No          Social History   Tobacco Use  Smoking Status Former Smoker  . Packs/day: 1.00  . Years: 25.00  . Pack years: 25.00  . Types: Cigarettes  . Last attempt to quit: 06/30/1987  . Years since quitting: 30.8  Smokeless Tobacco Never Used    Goals Met:  Independence with exercise equipment Exercise tolerated well No report of cardiac concerns or symptoms Strength training completed today  Goals Unmet:  Not Applicable  Comments: Pt able to follow exercise prescription today without complaint.  Will continue to monitor for progression.    Dr. Emily Filbert is Medical Director for [place] and [name].

## 2018-05-12 ENCOUNTER — Encounter: Payer: Medicare Other | Admitting: *Deleted

## 2018-05-12 DIAGNOSIS — I252 Old myocardial infarction: Secondary | ICD-10-CM | POA: Diagnosis not present

## 2018-05-12 DIAGNOSIS — I2510 Atherosclerotic heart disease of native coronary artery without angina pectoris: Secondary | ICD-10-CM | POA: Diagnosis not present

## 2018-05-12 DIAGNOSIS — Z951 Presence of aortocoronary bypass graft: Secondary | ICD-10-CM

## 2018-05-12 DIAGNOSIS — Z7902 Long term (current) use of antithrombotics/antiplatelets: Secondary | ICD-10-CM | POA: Diagnosis not present

## 2018-05-12 DIAGNOSIS — Z7982 Long term (current) use of aspirin: Secondary | ICD-10-CM | POA: Diagnosis not present

## 2018-05-12 DIAGNOSIS — Z79899 Other long term (current) drug therapy: Secondary | ICD-10-CM | POA: Diagnosis not present

## 2018-05-12 NOTE — Progress Notes (Signed)
Daily Session Note  Patient Details  Name: David Mccullough MRN: [id number] Date of Birth: 29-Jan-1946 Referring Provider:     Cardiac Rehab from 05/02/2018 in [facility] Cardiac and Pulmonary Rehab  Referring Provider  David Bickers MD [Local Cardiologist: David Duncans, MD]      Encounter Date: 05/12/2018  Check In: Session Check In - 05/12/18 0931      Check-In   Supervising physician immediately available to respond to emergencies  See telemetry face sheet for immediately available ER MD    Location  [facility]-Cardiac & Pulmonary Rehab    Staff Present  David Burdock, RN, BSN;David Luan Pulling, MA, RCEP, CCRP, Exercise Physiologist;David Mccullough RCP,RRT,BSRT    Medication changes reported      No    Fall or balance concerns reported     No    Resistance Training Performed  Yes    VAD Patient?  No    PAD/SET Patient?  No      Pain Assessment   Currently in Pain?  No/denies          Social History   Tobacco Use  Smoking Status Former Smoker  . Packs/day: 1.00  . Years: 25.00  . Pack years: 25.00  . Types: Cigarettes  . Last attempt to quit: 06/30/1987  . Years since quitting: 30.8  Smokeless Tobacco Never Used    Goals Met:  Independence with exercise equipment Exercise tolerated well No report of cardiac concerns or symptoms Strength training completed today  Goals Unmet:  Not Applicable  Comments: Reviewed home exercise with pt today.  Pt plans to use gym at apartment complex for exercise.  Reviewed THR, pulse, RPE, sign and symptoms, NTG use, and when to call 911 or MD.  Also discussed weather considerations and indoor options.  Pt voiced understanding.   Dr. Emily Mccullough is Medical Director for [place] and [name].

## 2018-05-16 ENCOUNTER — Telehealth: Payer: Self-pay

## 2018-05-16 ENCOUNTER — Other Ambulatory Visit: Payer: Self-pay

## 2018-05-16 MED ORDER — TRAZODONE HCL 50 MG PO TABS: 50.0000 mg | ORAL_TABLET | Freq: Every day | ORAL | 4 refills | Status: DC

## 2018-05-16 NOTE — Telephone Encounter (Signed)
Pt called wanting trazodone refilled- sent to [facility]

## 2018-05-17 DIAGNOSIS — Z79899 Other long term (current) drug therapy: Secondary | ICD-10-CM | POA: Diagnosis not present

## 2018-05-17 DIAGNOSIS — I252 Old myocardial infarction: Secondary | ICD-10-CM | POA: Diagnosis not present

## 2018-05-17 DIAGNOSIS — Z951 Presence of aortocoronary bypass graft: Secondary | ICD-10-CM

## 2018-05-17 DIAGNOSIS — Z7902 Long term (current) use of antithrombotics/antiplatelets: Secondary | ICD-10-CM | POA: Diagnosis not present

## 2018-05-17 DIAGNOSIS — I2510 Atherosclerotic heart disease of native coronary artery without angina pectoris: Secondary | ICD-10-CM | POA: Diagnosis not present

## 2018-05-17 DIAGNOSIS — Z7982 Long term (current) use of aspirin: Secondary | ICD-10-CM | POA: Diagnosis not present

## 2018-05-17 NOTE — Progress Notes (Signed)
Daily Session Note  Patient Details  Name: David Mccullough MRN: [id number] Date of Birth: 10-27-1945 Referring Provider:     Cardiac Rehab from 05/02/2018 in [facility] Cardiac and Pulmonary Rehab  Referring Provider  [name] MD [Local Cardiologist: [name], MD]      Encounter Date: 05/17/2018  Check In: Session Check In - 05/17/18 1038      Check-In   Supervising physician immediately available to respond to emergencies  See telemetry face sheet for immediately available ER MD    Location  [facility]-Cardiac & Pulmonary Rehab    Staff Present  Heath Lark, RN, BSN, CCRP;Jeanna Durrell BS, Exercise Physiologist;Jessica Clintwood, MA, RCEP, CCRP, Exercise Physiologist    Medication changes reported      No    Fall or balance concerns reported     No    Warm-up and Cool-down  Performed on first and last piece of equipment    Resistance Training Performed  Yes    VAD Patient?  No    PAD/SET Patient?  No      Pain Assessment   Currently in Pain?  No/denies    Multiple Pain Sites  No          Social History   Tobacco Use  Smoking Status Former Smoker  . Packs/day: 1.00  . Years: 25.00  . Pack years: 25.00  . Types: Cigarettes  . Last attempt to quit: 06/30/1987  . Years since quitting: 30.9  Smokeless Tobacco Never Used    Goals Met:  Independence with exercise equipment Exercise tolerated well Personal goals reviewed Strength training completed today  Goals Unmet:  Not Applicable  Comments: Pt able to follow exercise prescription today without complaint.  Will continue to monitor for progression.    Dr. Emily Filbert is Medical Director for [place] and [name].

## 2018-05-19 ENCOUNTER — Encounter: Payer: Medicare Other | Admitting: *Deleted

## 2018-05-19 DIAGNOSIS — Z7902 Long term (current) use of antithrombotics/antiplatelets: Secondary | ICD-10-CM | POA: Diagnosis not present

## 2018-05-19 DIAGNOSIS — Z951 Presence of aortocoronary bypass graft: Secondary | ICD-10-CM | POA: Diagnosis not present

## 2018-05-19 DIAGNOSIS — Z79899 Other long term (current) drug therapy: Secondary | ICD-10-CM | POA: Diagnosis not present

## 2018-05-19 DIAGNOSIS — Z7982 Long term (current) use of aspirin: Secondary | ICD-10-CM | POA: Diagnosis not present

## 2018-05-19 DIAGNOSIS — I2510 Atherosclerotic heart disease of native coronary artery without angina pectoris: Secondary | ICD-10-CM | POA: Diagnosis not present

## 2018-05-19 DIAGNOSIS — I252 Old myocardial infarction: Secondary | ICD-10-CM | POA: Diagnosis not present

## 2018-05-19 NOTE — Progress Notes (Signed)
Daily Session Note  Patient Details  Name: David Mccullough MRN: [id number] Date of Birth: 10-21-1945 Referring Provider:     Cardiac Rehab from 05/02/2018 in [facility] Cardiac and Pulmonary Rehab  Referring Provider  David Bickers MD [Local Cardiologist: David Duncans, MD]      Encounter Date: 05/19/2018  Check In: Session Check In - 05/19/18 0922      Check-In   Supervising physician immediately available to respond to emergencies  See telemetry face sheet for immediately available ER MD    Location  [facility]-Cardiac & Pulmonary Rehab    Staff Present  David Burdock, RN, BSN;David Mccullough BS, Exercise Physiologist;David Mccullough David Pulling, MA, RCEP, CCRP, Exercise Physiologist;David Mccullough RCP,RRT,BSRT    Medication changes reported      No    Fall or balance concerns reported     No    Warm-up and Cool-down  Performed on first and last piece of equipment    Resistance Training Performed  Yes    VAD Patient?  No    PAD/SET Patient?  No      Pain Assessment   Currently in Pain?  No/denies          Social History   Tobacco Use  Smoking Status Former Smoker  . Packs/day: 1.00  . Years: 25.00  . Pack years: 25.00  . Types: Cigarettes  . Last attempt to quit: 06/30/1987  . Years since quitting: 30.9  Smokeless Tobacco Never Used    Goals Met:  Independence with exercise equipment Exercise tolerated well No report of cardiac concerns or symptoms Strength training completed today  Goals Unmet:  Not Applicable  Comments: Pt able to follow exercise prescription today without complaint.  Will continue to monitor for progression.    Dr. Emily Mccullough is Medical Director for [facility] and [facility].

## 2018-05-24 ENCOUNTER — Encounter: Payer: Medicare Other | Admitting: *Deleted

## 2018-05-24 ENCOUNTER — Other Ambulatory Visit: Payer: Self-pay | Admitting: Cardiothoracic Surgery

## 2018-05-24 DIAGNOSIS — Z79899 Other long term (current) drug therapy: Secondary | ICD-10-CM | POA: Diagnosis not present

## 2018-05-24 DIAGNOSIS — I2510 Atherosclerotic heart disease of native coronary artery without angina pectoris: Secondary | ICD-10-CM | POA: Diagnosis not present

## 2018-05-24 DIAGNOSIS — I252 Old myocardial infarction: Secondary | ICD-10-CM | POA: Diagnosis not present

## 2018-05-24 DIAGNOSIS — Z7982 Long term (current) use of aspirin: Secondary | ICD-10-CM | POA: Diagnosis not present

## 2018-05-24 DIAGNOSIS — Z7902 Long term (current) use of antithrombotics/antiplatelets: Secondary | ICD-10-CM | POA: Diagnosis not present

## 2018-05-24 DIAGNOSIS — Z951 Presence of aortocoronary bypass graft: Secondary | ICD-10-CM

## 2018-05-24 NOTE — Progress Notes (Signed)
Daily Session Note  Patient Details  Name: David Mccullough MRN: [id number] Date of Birth: 12-30-1945 Referring Provider:     Cardiac Rehab from 05/02/2018 in [facility] Cardiac and Pulmonary Rehab  Referring Provider  [name] MD [Local Cardiologist: [name], MD]      Encounter Date: 05/24/2018  Check In: Session Check In - 05/24/18 0923      Check-In   Supervising physician immediately available to respond to emergencies  See telemetry face sheet for immediately available ER MD    Location  [facility]-Cardiac & Pulmonary Rehab    Staff Present  Heath Lark, RN, BSN, CCRP;Jeanna Durrell BS, Exercise Physiologist;Amanda Oletta Darter, BA, ACSM CEP, Exercise Physiologist    Medication changes reported      No    Fall or balance concerns reported     No    Warm-up and Cool-down  Performed as group-led instruction    Resistance Training Performed  Yes    VAD Patient?  No    PAD/SET Patient?  No      Pain Assessment   Currently in Pain?  No/denies          Social History   Tobacco Use  Smoking Status Former Smoker  . Packs/day: 1.00  . Years: 25.00  . Pack years: 25.00  . Types: Cigarettes  . Last attempt to quit: 06/30/1987  . Years since quitting: 30.9  Smokeless Tobacco Never Used    Goals Met:  Independence with exercise equipment Exercise tolerated well No report of cardiac concerns or symptoms Strength training completed today  Goals Unmet:  Not Applicable  Comments: Pt able to follow exercise prescription today without complaint.  Will continue to monitor for progression.    Dr. Emily Filbert is Medical Director for [place] and [name].

## 2018-05-25 ENCOUNTER — Other Ambulatory Visit (HOSPITAL_COMMUNITY): Payer: Self-pay | Admitting: Internal Medicine

## 2018-05-25 DIAGNOSIS — Z7982 Long term (current) use of aspirin: Secondary | ICD-10-CM | POA: Diagnosis not present

## 2018-05-25 DIAGNOSIS — I252 Old myocardial infarction: Secondary | ICD-10-CM | POA: Diagnosis not present

## 2018-05-25 DIAGNOSIS — Z79899 Other long term (current) drug therapy: Secondary | ICD-10-CM | POA: Diagnosis not present

## 2018-05-25 DIAGNOSIS — Z951 Presence of aortocoronary bypass graft: Secondary | ICD-10-CM

## 2018-05-25 DIAGNOSIS — I2510 Atherosclerotic heart disease of native coronary artery without angina pectoris: Secondary | ICD-10-CM | POA: Diagnosis not present

## 2018-05-25 DIAGNOSIS — Z7902 Long term (current) use of antithrombotics/antiplatelets: Secondary | ICD-10-CM | POA: Diagnosis not present

## 2018-05-25 MED ORDER — SPIRONOLACTONE 25 MG PO TABS: 25.0000 mg | ORAL_TABLET | Freq: Every day | ORAL | 2 refills | Status: DC

## 2018-05-25 MED ORDER — ATORVASTATIN CALCIUM 80 MG PO TABS: 80.0000 mg | ORAL_TABLET | Freq: Every day | ORAL | 2 refills | Status: DC

## 2018-05-25 NOTE — Progress Notes (Signed)
Daily Session Note  Patient Details  Name: David Mccullough MRN: [id number] Date of Birth: 1946/06/05 Referring Provider:     Cardiac Rehab from 05/02/2018 in [facility] Cardiac and Pulmonary Rehab  Referring Provider  [name] MD [Local Cardiologist: [name], MD]      Encounter Date: 05/25/2018  Check In:      Social History   Tobacco Use  Smoking Status Former Smoker  . Packs/day: 1.00  . Years: 25.00  . Pack years: 25.00  . Types: Cigarettes  . Last attempt to quit: 06/30/1987  . Years since quitting: 30.9  Smokeless Tobacco Never Used    Goals Met:  Independence with exercise equipment Exercise tolerated well No report of cardiac concerns or symptoms Strength training completed today  Goals Unmet:  Not Applicable  Comments: Pt able to follow exercise prescription today without complaint.  Will continue to monitor for progression.    Dr. Emily Filbert is Medical Director for [place] and [name].

## 2018-05-31 ENCOUNTER — Encounter: Payer: Medicare Other | Attending: Internal Medicine

## 2018-05-31 DIAGNOSIS — Z7982 Long term (current) use of aspirin: Secondary | ICD-10-CM | POA: Insufficient documentation

## 2018-05-31 DIAGNOSIS — I252 Old myocardial infarction: Secondary | ICD-10-CM | POA: Diagnosis not present

## 2018-05-31 DIAGNOSIS — Z79899 Other long term (current) drug therapy: Secondary | ICD-10-CM | POA: Insufficient documentation

## 2018-05-31 DIAGNOSIS — I4819 Other persistent atrial fibrillation: Secondary | ICD-10-CM | POA: Diagnosis not present

## 2018-05-31 DIAGNOSIS — Z951 Presence of aortocoronary bypass graft: Secondary | ICD-10-CM | POA: Insufficient documentation

## 2018-05-31 DIAGNOSIS — Z87891 Personal history of nicotine dependence: Secondary | ICD-10-CM | POA: Diagnosis not present

## 2018-05-31 DIAGNOSIS — Z7902 Long term (current) use of antithrombotics/antiplatelets: Secondary | ICD-10-CM | POA: Diagnosis not present

## 2018-05-31 DIAGNOSIS — I2510 Atherosclerotic heart disease of native coronary artery without angina pectoris: Secondary | ICD-10-CM | POA: Diagnosis not present

## 2018-05-31 DIAGNOSIS — F329 Major depressive disorder, single episode, unspecified: Secondary | ICD-10-CM | POA: Diagnosis not present

## 2018-05-31 NOTE — Progress Notes (Signed)
Daily Session Note  Patient Details  Name: Rumi Taras MRN: [id number] Date of Birth: 11-Aug-1945 Referring Provider:     Cardiac Rehab from 05/02/2018 in [facility]  Referring Provider  [name] MD [Local Cardiologist: [name], MD]      Encounter Date: 05/31/2018  Check In: Session Check In - 05/31/18 0928      Check-In   Supervising physician immediately available to respond to emergencies  See telemetry face sheet for immediately available ER MD    Location  [facility]-Cardiac & Pulmonary Rehab    Staff Present  Heath Lark, RN, BSN, CCRP;Jessica Luan Pulling, MA, RCEP, CCRP, Exercise Physiologist;Raylie Maddison BS, Exercise Physiologist    Medication changes reported      No    Fall or balance concerns reported     No    Warm-up and Cool-down  Performed as group-led instruction    Resistance Training Performed  Yes    VAD Patient?  No    PAD/SET Patient?  No      Pain Assessment   Currently in Pain?  No/denies          Social History   Tobacco Use  Smoking Status Former Smoker  . Packs/day: 1.00  . Years: 25.00  . Pack years: 25.00  . Types: Cigarettes  . Last attempt to quit: 06/30/1987  . Years since quitting: 30.9  Smokeless Tobacco Never Used    Goals Met:  Independence with exercise equipment Exercise tolerated well No report of cardiac concerns or symptoms Strength training completed today  Goals Unmet:  Not Applicable  Comments: Pt able to follow exercise prescription today without complaint.  Will continue to monitor for progression.    Dr. Emily Filbert is Medical Director for [name].

## 2018-06-01 ENCOUNTER — Encounter: Payer: Self-pay | Admitting: *Deleted

## 2018-06-01 DIAGNOSIS — Z951 Presence of aortocoronary bypass graft: Secondary | ICD-10-CM

## 2018-06-01 NOTE — Progress Notes (Signed)
Cardiac Individual Treatment Plan  Patient Details  Name: David Mccullough MRN: [id number] Date of Birth: [birth year] Referring Provider:     Cardiac Rehab from 05/02/2018 in [facility]  Referring Provider  [name] MD [Local Cardiologist: [name], MD]      Initial Encounter Date:    Cardiac Rehab from 05/02/2018 in [facility] Cardiac and Pulmonary Rehab  Date  05/02/18      Visit Diagnosis: S/P CABG x 3  Patient's Home Medications on Admission:  Current Outpatient Medications:  .  aspirin 81 MG chewable tablet, Chew 81 mg by mouth at bedtime. , Disp: , Rfl:  .  atorvastatin (LIPITOR) 80 MG tablet, Take 1 tablet (80 mg total) by mouth daily at 6 PM., Disp: 30 tablet, Rfl: 2 .  Calcium Carb-Cholecalciferol (CALCIUM 1000 + D PO), Take 1 tablet by mouth daily. , Disp: , Rfl:  .  clopidogrel (PLAVIX) 75 MG tablet, TAKE 1 TABLET BY MOUTH DAILY, Disp: 30 tablet, Rfl: 2 .  furosemide (LASIX) 40 MG tablet, Take 1 tablet (40 mg total) by mouth daily as needed. only on days that you gain 3 pounds. Weigh yourself each morning, Disp: 30 tablet, Rfl: 0 .  Multiple Vitamin (MULTIVITAMIN) tablet, Take 1 tablet by mouth daily., Disp: , Rfl:  .  Omega-3 Fatty Acids (FISH OIL) 1200 MG CAPS, Take 1 capsule by mouth daily. , Disp: , Rfl:  .  potassium chloride SA (K-DUR,KLOR-CON) 20 MEQ tablet, Take 1 tablet (20 mEq total) by mouth daily as needed. Only on days that you take a dose of lasix, Disp: 30 tablet, Rfl: 0 .  spironolactone (ALDACTONE) 25 MG tablet, Take 1 tablet (25 mg total) by mouth daily., Disp: 30 tablet, Rfl: 2 .  traZODone (DESYREL) 50 MG tablet, Take 1 tablet (50 mg total) by mouth at bedtime., Disp: 30 tablet, Rfl: 4  Past Medical History: Past Medical History:  Diagnosis Date  . CAD (coronary artery disease)   . Colon polyps   . Depression   . Elevated PSA   . Myocardial infarction ([place])    x 2  . Persistent atrial fibrillation   . Tachycardia      Tobacco Use: Social History   Tobacco Use  Smoking Status Former Smoker  . Packs/day: 1.00  . Years: 25.00  . Pack years: 25.00  . Types: Cigarettes  . Last attempt to quit: 06/30/1987  . Years since quitting: 30.9  Smokeless Tobacco Never Used    Labs: Recent Review Scientist, physiological    Labs for ITP Cardiac and Pulmonary Rehab Latest Ref Rng & Units 03/27/2018 03/28/2018 03/29/2018 03/29/2018 03/30/2018   Cholestrol 0 - 200 mg/dL - - - - -   LDLCALC 0 - 99 mg/dL - - - - -   HDL >40 mg/dL - - - - -   Trlycerides <150 mg/dL - - - - -   Hemoglobin A1c 4.8 - 5.6 % - - - - -   PHART 7.350 - 7.450 - - - - -   PCO2ART 32.0 - 48.0 mmHg - - - - -   HCO3 20.0 - 28.0 mmol/L - - - - -   TCO2 22 - 32 mmol/L - - - - -   ACIDBASEDEF 0.0 - 2.0 mmol/L - - - - -   O2SAT % 54.1 56.6 49.4 48.3 51.2       Exercise Target Goals: Exercise Program Goal: Individual exercise prescription set using results from initial 6  min walk test and THRR while considering  patient's activity barriers and safety.   Exercise Prescription Goal: Initial exercise prescription builds to 30-45 minutes a day of aerobic activity, 2-3 days per week.  Home exercise guidelines will be given to patient during program as part of exercise prescription that the participant will acknowledge.  Activity Barriers & Risk Stratification: Activity Barriers & Cardiac Risk Stratification - 05/02/18 1347      Activity Barriers & Cardiac Risk Stratification   Activity Barriers  Deconditioning;Muscular Weakness    Cardiac Risk Stratification  High       6 Minute Walk: 6 Minute Walk    Row Name 05/02/18 1456         6 Minute Walk   Phase  Initial     Distance  1264 feet     Walk Time  6 minutes     MPH  2.39     METS  2.59     RPE  11     VO2 Peak  9.06     Symptoms  No     Resting HR  76 bpm     Resting BP  136/66     Resting Oxygen Saturation   100 %     Exercise Oxygen Saturation  during 6 min walk  98 %     Max Ex.  HR  81 bpm     Max Ex. BP  144/70     2 Minute Post BP  136/62        Oxygen Initial Assessment:   Oxygen Re-Evaluation:   Oxygen Discharge (Final Oxygen Re-Evaluation):   Initial Exercise Prescription: Initial Exercise Prescription - 05/02/18 1400      Date of Initial Exercise RX and Referring Provider   Date  05/02/18    Referring Provider  [name] MD   Local Cardiologist: [name], MD     Treadmill   MPH  1.9    Grade  0.5    Minutes  15    METs  2.59      REL-XR   Level  1    Speed  50    Minutes  15    METs  2.5      T5 Nustep   Level  1    SPM  80    Minutes  15    METs  2.5      Prescription Details   Frequency (times per week)  2    Duration  Progress to 30 minutes of continuous aerobic without signs/symptoms of physical distress      Intensity   THRR 40-80% of Max Heartrate  104-133    Ratings of Perceived Exertion  11-13    Perceived Dyspnea  0-4      Progression   Progression  Continue to progress workloads to maintain intensity without signs/symptoms of physical distress.      Resistance Training   Training Prescription  Yes    Weight  4 lbs    Reps  10-15       Perform Capillary Blood Glucose checks as needed.  Exercise Prescription Changes: Exercise Prescription Changes    Row Name 05/02/18 1300 05/10/18 1600 05/23/18 1600         Response to Exercise   Blood Pressure (Admit)  136/66  124/64  118/62     Blood Pressure (Exercise)  144/70  136/78  168/78     Blood Pressure (Exit)  136/62  114/60  100/72  Heart Rate (Admit)  76 bpm  71 bpm  75 bpm     Heart Rate (Exercise)  81 bpm  115 bpm  113 bpm     Heart Rate (Exit)  74 bpm  85 bpm  81 bpm     Oxygen Saturation (Admit)  100 %  -  -     Oxygen Saturation (Exercise)  98 %  -  -     Rating of Perceived Exertion (Exercise)  _0 Symptoms  none  none  none     Comments  walk test results  -  -     Duration  -  Continue with 30 min of aerobic  exercise without signs/symptoms of physical distress.  Continue with 30 min of aerobic exercise without signs/symptoms of physical distress.     Intensity  -  THRR unchanged  THRR unchanged       Progression   Progression  -  Continue to progress workloads to maintain intensity without signs/symptoms of physical distress.  Continue to progress workloads to maintain intensity without signs/symptoms of physical distress.     Average METs  -  2.13  2.63       Resistance Training   Training Prescription  -  Yes  Yes     Weight  -  4 lbs  4 lbs     Reps  -  10-15  10-15       Interval Training   Interval Training  -  No  No       Treadmill   MPH  -  1.9  1.9     Grade  -  0.5  0.5     Minutes  -  15  15     METs  -  2.59  2.59       REL-XR   Level  -  1  3     Minutes  -  15  15     METs  -  1.8  3.1       T5 Nustep   Level  -  4  4     Minutes  -  15  15     METs  -  2  2.2       Home Exercise Plan   Plans to continue exercise at  -  -  Home (comment) gym in complex     Frequency  -  -  Add 3 additional days to program exercise sessions.     Initial Home Exercises Provided  -  -  05/12/18        Exercise Comments: Exercise Comments    Row Name 05/03/18 1109 05/12/18 1054         Exercise Comments  First full day of exercise!  Patient was oriented to gym and equipment including functions, settings, policies, and procedures.  Patient's individual exercise prescription and treatment plan were reviewed.  All starting workloads were established based on the results of the 6 minute walk test done at initial orientation visit.  The plan for exercise progression was also introduced and progression will be customized based on patient's performance and goals.   Reviewed home exercise with pt today.  Pt plans to use gym at apartment complex for exercise.  Reviewed THR, pulse, RPE, sign and symptoms, NTG use, and when to call 911 or MD.  Also discussed weather considerations and indoor  options.  Pt  voiced understanding.         Exercise Goals and Review: Exercise Goals    Row Name 05/02/18 1501             Exercise Goals   Increase Physical Activity  Yes       Intervention  Provide advice, education, support and counseling about physical activity/exercise needs.;Develop an individualized exercise prescription for aerobic and resistive training based on initial evaluation findings, risk stratification, comorbidities and participant's personal goals.       Expected Outcomes  Short Term: Attend rehab on a regular basis to increase amount of physical activity.;Long Term: Add in home exercise to make exercise part of routine and to increase amount of physical activity.;Long Term: Exercising regularly at least 3-5 days a week.       Increase Strength and Stamina  Yes       Intervention  Provide advice, education, support and counseling about physical activity/exercise needs.;Develop an individualized exercise prescription for aerobic and resistive training based on initial evaluation findings, risk stratification, comorbidities and participant's personal goals.       Expected Outcomes  Short Term: Increase workloads from initial exercise prescription for resistance, speed, and METs.;Short Term: Perform resistance training exercises routinely during rehab and add in resistance training at home;Long Term: Improve cardiorespiratory fitness, muscular endurance and strength as measured by increased METs and functional capacity (6MWT)       Able to understand and use rate of perceived exertion (RPE) scale  Yes       Intervention  Provide education and explanation on how to use RPE scale       Expected Outcomes  Short Term: Able to use RPE daily in rehab to express subjective intensity level;Long Term:  Able to use RPE to guide intensity level when exercising independently       Knowledge and understanding of Target Heart Rate Range (THRR)  Yes       Intervention  Provide education and  explanation of THRR including how the numbers were predicted and where they are located for reference       Expected Outcomes  Short Term: Able to state/look up THRR;Short Term: Able to use daily as guideline for intensity in rehab;Long Term: Able to use THRR to govern intensity when exercising independently       Able to check pulse independently  Yes       Intervention  Provide education and demonstration on how to check pulse in carotid and radial arteries.;Review the importance of being able to check your own pulse for safety during independent exercise       Expected Outcomes  Short Term: Able to explain why pulse checking is important during independent exercise;Long Term: Able to check pulse independently and accurately       Understanding of Exercise Prescription  Yes       Intervention  Provide education, explanation, and written materials on patient's individual exercise prescription       Expected Outcomes  Short Term: Able to explain program exercise prescription;Long Term: Able to explain home exercise prescription to exercise independently          Exercise Goals Re-Evaluation : Exercise Goals Re-Evaluation    Row Name 05/03/18 1109 05/10/18 1559 05/12/18 1054 05/23/18 1610       Exercise Goal Re-Evaluation   Exercise Goals Review  Increase Physical Activity;Increase Strength and Stamina;Able to understand and use rate of perceived exertion (RPE) scale;Knowledge and understanding of Target Heart Rate Range (THRR);Understanding  of Exercise Prescription  Increase Physical Activity;Increase Strength and Stamina;Understanding of Exercise Prescription  Increase Physical Activity;Increase Strength and Stamina;Able to understand and use rate of perceived exertion (RPE) scale;Knowledge and understanding of Target Heart Rate Range (THRR);Able to check pulse independently  Increase Physical Activity;Increase Strength and Stamina;Understanding of Exercise Prescription    Comments  Reviewed RPE  scale, THR and program prescription with pt today.  Pt voiced understanding and was given a copy of goals to take home.   David Mccullough is off to a good start in rehab.  He has completed his first three full days of exercise.  He is now up to level 4 on the NuStep. We will continue to moniotr his progression.    Reviewed home exercise with pt today.  Pt plans to use gym at apartment complex for exercise.  Reviewed THR, pulse, RPE, sign and symptoms, NTG use, and when to call 911 or MD.  Also discussed weather considerations and indoor options.  Pt voiced understanding.  David Mccullough is doing well in rehab.  He is now up to 3.1 METs on the XR.  We will continue to monitor his workloads.     Expected Outcomes  Short: Use RPE daily to regulate intensity. Long: Follow program prescription in THR.  Short: Increase workloads and review home exercise guidelines.  Long: Continue to follow program prescription.   Short - increase walking time to 30 min from 15 - practice taking HR Long - maintain exercise on his own  Short: Increase T5 NuStep.  Long: Continue to increase strength and stamina.        Discharge Exercise Prescription (Final Exercise Prescription Changes): Exercise Prescription Changes - 05/23/18 1600      Response to Exercise   Blood Pressure (Admit)  118/62    Blood Pressure (Exercise)  168/78    Blood Pressure (Exit)  100/72    Heart Rate (Admit)  75 bpm    Heart Rate (Exercise)  113 bpm    Heart Rate (Exit)  81 bpm    Rating of Perceived Exertion (Exercise)  12    Symptoms  none    Duration  Continue with 30 min of aerobic exercise without signs/symptoms of physical distress.    Intensity  THRR unchanged      Progression   Progression  Continue to progress workloads to maintain intensity without signs/symptoms of physical distress.    Average METs  2.63      Resistance Training   Training Prescription  Yes    Weight  4 lbs    Reps  10-15      Interval Training   Interval Training  No       Treadmill   MPH  1.9    Grade  0.5    Minutes  15    METs  2.59      REL-XR   Level  3    Minutes  15    METs  3.1      T5 Nustep   Level  4    Minutes  15    METs  2.2      Home Exercise Plan   Plans to continue exercise at  Home (comment)   gym in complex   Frequency  Add 3 additional days to program exercise sessions.    Initial Home Exercises Provided  05/12/18       Nutrition:  Target Goals: Understanding of nutrition guidelines, daily intake of sodium <1523m, cholesterol <2031m calories 30% from  fat and 7% or less from saturated fats, daily to have 5 or more servings of fruits and vegetables.  Biometrics: Pre Biometrics - 05/02/18 1502      Pre Biometrics   Height  5' 11.1" (1.806 m)    Weight  202 lb 8 oz (91.9 kg)    Waist Circumference  40 inches    Hip Circumference  38 inches    Waist to Hip Ratio  1.05 %    BMI (Calculated)  28.16    Single Leg Stand  8.07 seconds        Nutrition Therapy Plan and Nutrition Goals: Nutrition Therapy & Goals - 05/03/18 1108      Nutrition Therapy   Diet  TLC    Drug/Food Interactions  Statins/Certain Fruits    Protein (specify units)  9oz    Fiber  30 grams    Whole Grain Foods  3 servings   chooses whole grain bread   Saturated Fats  15 max. grams    Fruits and Vegetables  5 servings/day   8 ideal; eats more vegetables than fruits   Sodium  1500 grams      Personal Nutrition Goals   Nutrition Goal  Include at least one additional serving of fish per week    Personal Goal #2  Try breakfast options that are higher in fiber and lower in saturated/trans fats. For example, swap your sausage + egg biscuit for canadian bacon + egg english muffin or whole grain bread sandwich or choosing oatmeal instead.     Comments  He feels that there are some areas of his diet that he and his wife couple improve, particularly with the types of fats and proteins they eat. His usual breakfast is cereal or a sausage and egg biscuit  sandwich. For lunch he eats sandwiches on wheat bread or salads. For dinners they eat at home 3 days/week and eat out 4 days/week typically. At home his wife cooks chicken or occasionally steak with vegetables / salad and sometimes starches like baked idaho/sweet potatoes or pasta. If eating out they try to choose restaurants rather than fast food; recently [place] and [employer]. He does not add salt to meals post-preparation. Beverages: water, juice, diet soda, non-fat milk. They do not typically buy canned foods or pre-packaged meals. Snack foods include popcorn, cheese crackers, or yogurt.      Intervention Plan   Intervention  Prescribe, educate and counsel regarding individualized specific dietary modifications aiming towards targeted core components such as weight, hypertension, lipid management, diabetes, heart failure and other comorbidities.;Nutrition handout(s) given to patient.   General heart healthy eating powerpoint packet   Expected Outcomes  Short Term Goal: A plan has been developed with personal nutrition goals set during dietitian appointment.;Long Term Goal: Adherence to prescribed nutrition plan.;Short Term Goal: Understand basic principles of dietary content, such as calories, fat, sodium, cholesterol and nutrients.       Nutrition Assessments: Nutrition Assessments - 05/02/18 1346      MEDFICTS Scores   Pre Score  28       Nutrition Goals Re-Evaluation: Nutrition Goals Re-Evaluation    Row Name 05/19/18 1007             Goals   Nutrition Goal  Include at least one additional serving of fish per week; Try breakfast options that are higher in fiber and lower in saturated / trans fats.       Comment  He and his wife have been buying  more fish to eat but he has not yet tried different breakfast options. They have not been going out to eat much but try to make healthier choices such as salads when they do       Expected Outcome  They will maintain increased intake of fish as  opposed to less lean protein sources. Long term goal: trial breakfast options that are lower in saturated and trans fats          Nutrition Goals Discharge (Final Nutrition Goals Re-Evaluation): Nutrition Goals Re-Evaluation - 05/19/18 1007      Goals   Nutrition Goal  Include at least one additional serving of fish per week; Try breakfast options that are higher in fiber and lower in saturated / trans fats.    Comment  He and his wife have been buying more fish to eat but he has not yet tried different breakfast options. They have not been going out to eat much but try to make healthier choices such as salads when they do    Expected Outcome  They will maintain increased intake of fish as opposed to less lean protein sources. Long term goal: trial breakfast options that are lower in saturated and trans fats       Psychosocial: Target Goals: Acknowledge presence or absence of significant depression and/or stress, maximize coping skills, provide positive support system. Participant is able to verbalize types and ability to use techniques and skills needed for reducing stress and depression.   Initial Review & Psychosocial Screening: Initial Psych Review & Screening - 05/02/18 1342      Initial Review   Current issues with  Current Stress Concerns;History of Depression    Source of Stress Concerns  Family    Comments  David Mccullough son struggles with Bipolar. He is currently homeless, in and out of the hospital and on and off his meds. David Mccullough and his wife reported this is a major stressful for their family. Mrs. Buckner expressed concern for her husband that before they moved from [facility] David Mccullough was seeing a psychiatrist. His psychiatrist had taken him off his depression medications because he was doing so well. They both are concerned he may need to go back on them since his heart surgery and recent events with his son. They were encouraged to reach out to his past psychiatrist to either see  him or be referred to someone local.       [place]?  Yes   wife, friends     Barriers   Psychosocial barriers to participate in program  The patient should benefit from training in stress management and relaxation.      Screening Interventions   Interventions  Encouraged to exercise;Program counselor consult;To provide support and resources with identified psychosocial needs;Provide feedback about the scores to participant    Expected Outcomes  Short Term goal: Utilizing psychosocial counselor, staff and physician to assist with identification of specific Stressors or current issues interfering with healing process. Setting desired goal for each stressor or current issue identified.;Long Term Goal: Stressors or current issues are controlled or eliminated.;Short Term goal: Identification and review with participant of any Quality of Life or Depression concerns found by scoring the questionnaire.;Long Term goal: The participant improves quality of Life and PHQ9 Scores as seen by post scores and/or verbalization of changes       Quality of Life Scores:  Quality of Life - 05/02/18 1346      Quality  of Life   Select  Quality of Life      Quality of Life Scores   Health/Function Pre  15.85 %    Socioeconomic Pre  24 %    Psych/Spiritual Pre  17.07 %    Family Pre  16.8 %    GLOBAL Pre  17.85 %      Scores of 19 and below usually indicate a poorer quality of life in these areas.  A difference of  2-3 points is a clinically meaningful difference.  A difference of 2-3 points in the total score of the Quality of Life Index has been associated with significant improvement in overall quality of life, self-image, physical symptoms, and general health in studies assessing change in quality of life.  PHQ-9: Recent Review Flowsheet Data    Depression screen [facility] 2/9 05/02/2018 04/14/2018   Decreased Interest 1 0   Down, Depressed, Hopeless 2 0   PHQ - 2 Score 3 0    Altered sleeping 0 -   Tired, decreased energy 1 -   Change in appetite 1 -   Feeling bad or failure about yourself  0 -   Trouble concentrating 0 -   Moving slowly or fidgety/restless 0 -   Suicidal thoughts 1  -   PHQ-9 Score 6 -   Difficult doing work/chores Somewhat difficult -     Interpretation of Total Score  Total Score Depression Severity:  1-4 = Minimal depression, 5-9 = Mild depression, 10-14 = Moderate depression, 15-19 = Moderately severe depression, 20-27 = Severe depression   Psychosocial Evaluation and Intervention: Psychosocial Evaluation - 05/20/18 1416      Psychosocial Evaluation & Interventions   Interventions  Stress management education;Encouraged to exercise with the program and follow exercise prescription;Therapist referral    Comments  Counselor met with David Mccullough) for initial psychosocial evaluation.  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  David Mccullough has a strong support system with a spouse of 74 years; (2) sons locally and active involvement in his local church.  He reports sleeping well with the use of medication and has a good appetite.   Zaquan reports a history of depression and was on medications for ~(1) year but was discontinued by his psychiatrist recently because he was doing so well.  However, He admits that he has been so discouraged lately that he has had thoughts of being better off dead.  Counselor assessed with David Mccullough reporting no current plan and he "would not do that."  He reports his bipolar son has been verbally abusive to he and his wife often lately.  His son has also been hospitalized multiple times and will not stay on his medications.  This is stressful for David Mccullough and his son is also a financial burden as they are paying for him to stay in a "cheap motel" to keep him off the streets.  David Mccullough realizes this is impacting his recovery in a negative way.  Counselor encouraged him to attend [facility] family support groups designed to  help families cope with mental illness in their loved ones.  He is planning to go to a meeting tonight.  Also, counselor provided David Mccullough with a (35 page) resource guide for possible shelters, etc. etc for his son.  Counselor provided local therapist options for David Mccullough and his wife to be supported through this difficult time.  David Mccullough has been encouraged by staff to consult with his psychiatrist to possibly begin his medications for  his mood back due to his current condition and how "difficult the holidays are" for their family to be around this son.  Din has goals to increase his stamina and strength while in this program and hopefully manage his stress better as well.  Counselor will follow with David Mccullough while in this program.      Expected Outcomes  Short:  David Mccullough will contact his psychiatrist for a medication evaluation due to increased current stressors.  David Mccullough and his spouse will find a therapist for themselves and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

## 2018-06-02 ENCOUNTER — Encounter: Payer: Medicare Other | Admitting: *Deleted

## 2018-06-02 DIAGNOSIS — Z951 Presence of aortocoronary bypass graft: Secondary | ICD-10-CM

## 2018-06-02 DIAGNOSIS — I2510 Atherosclerotic heart disease of native coronary artery without angina pectoris: Secondary | ICD-10-CM | POA: Diagnosis not present

## 2018-06-02 DIAGNOSIS — Z7982 Long term (current) use of aspirin: Secondary | ICD-10-CM | POA: Diagnosis not present

## 2018-06-02 DIAGNOSIS — I252 Old myocardial infarction: Secondary | ICD-10-CM | POA: Diagnosis not present

## 2018-06-02 DIAGNOSIS — Z7902 Long term (current) use of antithrombotics/antiplatelets: Secondary | ICD-10-CM | POA: Diagnosis not present

## 2018-06-02 DIAGNOSIS — Z79899 Other long term (current) drug therapy: Secondary | ICD-10-CM | POA: Diagnosis not present

## 2018-06-02 NOTE — Progress Notes (Signed)
Daily Session Note  Patient Details  Name: Dabid [place]own MRN: [id number] Date of Birth: 05/20/1946 Referring Provider:     Cardiac Rehab from 05/02/2018 in [facility] Cardiac and Pulmonary Rehab  Referring Provider  [name] MD [Local Cardiologist: [name], MD]      Encounter Date: 06/02/2018  Check In: Session Check In - 06/02/18 0923      Check-In   Supervising physician immediately available to respond to emergencies  See telemetry face sheet for immediately available ER MD    Location  [facility]-Cardiac & Pulmonary Rehab    Staff Present  Alberteen Sam, MA, RCEP, CCRP, Exercise Physiologist;Jeanna Durrell BS, Exercise Physiologist;Leslie Castrejon RN, BSN;Carroll Enterkin, RN, BSN;Joseph Hood RCP,RRT,BSRT    Medication changes reported      No    Fall or balance concerns reported     No    Warm-up and Cool-down  Performed as group-led Higher education careers adviser Performed  Yes    VAD Patient?  No    PAD/SET Patient?  No      Pain Assessment   Currently in Pain?  No/denies          Social History   Tobacco Use  Smoking Status Former Smoker  . Packs/day: 1.00  . Years: 25.00  . Pack years: 25.00  . Types: Cigarettes  . Last attempt to quit: 06/30/1987  . Years since quitting: 30.9  Smokeless Tobacco Never Used    Goals Met:  Independence with exercise equipment Exercise tolerated well No report of cardiac concerns or symptoms Strength training completed today  Goals Unmet:  Not Applicable  Comments: Pt able to follow exercise prescription today without complaint.  Will continue to monitor for progression.    Dr. Emily Filbert is Medical Director for [place] [name].

## 2018-06-02 NOTE — Progress Notes (Signed)
Cardiac Individual Treatment Plan  Patient Details  Name: David Mccullough MRN: [id number] Date of Birth: [birth year] Referring Provider:     Cardiac Rehab from 05/02/2018 in [facility] Cardiac and Pulmonary Rehab  Referring Provider  [name] MD [Local Cardiologist: [name], MD]      Initial Encounter Date:    Cardiac Rehab from 05/02/2018 in [facility] Cardiac and Pulmonary Rehab  Date  05/02/18      Visit Diagnosis: S/P CABG x 3  Patient's Home Medications on Admission:  Current Outpatient Medications:  .  aspirin 81 MG chewable tablet, Chew 81 mg by mouth at bedtime. , Disp: , Rfl:  .  atorvastatin (LIPITOR) 80 MG tablet, Take 1 tablet (80 mg total) by mouth daily at 6 PM., Disp: 30 tablet, Rfl: 2 .  Calcium Carb-Cholecalciferol (CALCIUM 1000 + D PO), Take 1 tablet by mouth daily. , Disp: , Rfl:  .  clopidogrel (PLAVIX) 75 MG tablet, TAKE 1 TABLET BY MOUTH DAILY, Disp: 30 tablet, Rfl: 2 .  furosemide (LASIX) 40 MG tablet, Take 1 tablet (40 mg total) by mouth daily as needed. only on days that you gain 3 pounds. Weigh yourself each morning, Disp: 30 tablet, Rfl: 0 .  Multiple Vitamin (MULTIVITAMIN) tablet, Take 1 tablet by mouth daily., Disp: , Rfl:  .  Omega-3 Fatty Acids (FISH OIL) 1200 MG CAPS, Take 1 capsule by mouth daily. , Disp: , Rfl:  .  potassium chloride SA (K-DUR,KLOR-CON) 20 MEQ tablet, Take 1 tablet (20 mEq total) by mouth daily as needed. Only on days that you take a dose of lasix, Disp: 30 tablet, Rfl: 0 .  spironolactone (ALDACTONE) 25 MG tablet, Take 1 tablet (25 mg total) by mouth daily., Disp: 30 tablet, Rfl: 2 .  traZODone (DESYREL) 50 MG tablet, Take 1 tablet (50 mg total) by mouth at bedtime., Disp: 30 tablet, Rfl: 4  Past Medical History: Past Medical History:  Diagnosis Date  . CAD (coronary artery disease)   . Colon polyps   . Depression   . Elevated PSA   . Myocardial infarction ([place])    x 2  . Persistent atrial fibrillation   . Tachycardia      Tobacco Use: Social History   Tobacco Use  Smoking Status Former Smoker  . Packs/day: 1.00  . Years: 25.00  . Pack years: 25.00  . Types: Cigarettes  . Last attempt to quit: 06/30/1987  . Years since quitting: 30.9  Smokeless Tobacco Never Used    Labs: Recent Review Scientist, physiological    Labs for ITP Cardiac and Pulmonary Rehab Latest Ref Rng & Units 03/27/2018 03/28/2018 03/29/2018 03/29/2018 03/30/2018   Cholestrol 0 - 200 mg/dL - - - - -   LDLCALC 0 - 99 mg/dL - - - - -   HDL >40 mg/dL - - - - -   Trlycerides <150 mg/dL - - - - -   Hemoglobin A1c 4.8 - 5.6 % - - - - -   PHART 7.350 - 7.450 - - - - -   PCO2ART 32.0 - 48.0 mmHg - - - - -   HCO3 20.0 - 28.0 mmol/L - - - - -   TCO2 22 - 32 mmol/L - - - - -   ACIDBASEDEF 0.0 - 2.0 mmol/L - - - - -   O2SAT % 54.1 56.6 49.4 48.3 51.2       Exercise Target Goals: Exercise Program Goal: Individual exercise prescription set using results from initial 6  min walk test and THRR while considering  patient's activity barriers and safety.   Exercise Prescription Goal: Initial exercise prescription builds to 30-45 minutes a day of aerobic activity, 2-3 days per week.  Home exercise guidelines will be given to patient during program as part of exercise prescription that the participant will acknowledge.  Activity Barriers & Risk Stratification: Activity Barriers & Cardiac Risk Stratification - 05/02/18 1347      Activity Barriers & Cardiac Risk Stratification   Activity Barriers  Deconditioning;Muscular Weakness    Cardiac Risk Stratification  High       6 Minute Walk: 6 Minute Walk    Row Name 05/02/18 1456         6 Minute Walk   Phase  Initial     Distance  1264 feet     Walk Time  6 minutes     MPH  2.39     METS  2.59     RPE  11     VO2 Peak  9.06     Symptoms  No     Resting HR  76 bpm     Resting BP  136/66     Resting Oxygen Saturation   100 %     Exercise Oxygen Saturation  during 6 min walk  98 %     Max Ex.  HR  81 bpm     Max Ex. BP  144/70     2 Minute Post BP  136/62        Oxygen Initial Assessment:   Oxygen Re-Evaluation:   Oxygen Discharge (Final Oxygen Re-Evaluation):   Initial Exercise Prescription: Initial Exercise Prescription - 05/02/18 1400      Date of Initial Exercise RX and Referring Provider   Date  05/02/18    Referring Provider  [name] MD   Local Cardiologist: [name], MD     Treadmill   MPH  1.9    Grade  0.5    Minutes  15    METs  2.59      REL-XR   Level  1    Speed  50    Minutes  15    METs  2.5      T5 Nustep   Level  1    SPM  80    Minutes  15    METs  2.5      Prescription Details   Frequency (times per week)  2    Duration  Progress to 30 minutes of continuous aerobic without signs/symptoms of physical distress      Intensity   THRR 40-80% of Max Heartrate  104-133    Ratings of Perceived Exertion  11-13    Perceived Dyspnea  0-4      Progression   Progression  Continue to progress workloads to maintain intensity without signs/symptoms of physical distress.      Resistance Training   Training Prescription  Yes    Weight  4 lbs    Reps  10-15       Perform Capillary Blood Glucose checks as needed.  Exercise Prescription Changes: Exercise Prescription Changes    Row Name 05/02/18 1300 05/10/18 1600 05/23/18 1600         Response to Exercise   Blood Pressure (Admit)  136/66  124/64  118/62     Blood Pressure (Exercise)  144/70  136/78  168/78     Blood Pressure (Exit)  136/62  114/60  100/72  Heart Rate (Admit)  76 bpm  71 bpm  75 bpm     Heart Rate (Exercise)  81 bpm  115 bpm  113 bpm     Heart Rate (Exit)  74 bpm  85 bpm  81 bpm     Oxygen Saturation (Admit)  100 %  -  -     Oxygen Saturation (Exercise)  98 %  -  -     Rating of Perceived Exertion (Exercise)  _0 Symptoms  none  none  none     Comments  walk test results  -  -     Duration  -  Continue with 30 min of aerobic  exercise without signs/symptoms of physical distress.  Continue with 30 min of aerobic exercise without signs/symptoms of physical distress.     Intensity  -  THRR unchanged  THRR unchanged       Progression   Progression  -  Continue to progress workloads to maintain intensity without signs/symptoms of physical distress.  Continue to progress workloads to maintain intensity without signs/symptoms of physical distress.     Average METs  -  2.13  2.63       Resistance Training   Training Prescription  -  Yes  Yes     Weight  -  4 lbs  4 lbs     Reps  -  10-15  10-15       Interval Training   Interval Training  -  No  No       Treadmill   MPH  -  1.9  1.9     Grade  -  0.5  0.5     Minutes  -  15  15     METs  -  2.59  2.59       REL-XR   Level  -  1  3     Minutes  -  15  15     METs  -  1.8  3.1       T5 Nustep   Level  -  4  4     Minutes  -  15  15     METs  -  2  2.2       Home Exercise Plan   Plans to continue exercise at  -  -  Home (comment) gym in complex     Frequency  -  -  Add 3 additional days to program exercise sessions.     Initial Home Exercises Provided  -  -  05/12/18        Exercise Comments: Exercise Comments    Row Name 05/03/18 1109 05/12/18 1054         Exercise Comments  First full day of exercise!  Patient was oriented to gym and equipment including functions, settings, policies, and procedures.  Patient's individual exercise prescription and treatment plan were reviewed.  All starting workloads were established based on the results of the 6 minute walk test done at initial orientation visit.  The plan for exercise progression was also introduced and progression will be customized based on patient's performance and goals.   Reviewed home exercise with pt today.  Pt plans to use gym at apartment complex for exercise.  Reviewed THR, pulse, RPE, sign and symptoms, NTG use, and when to call 911 or MD.  Also discussed weather considerations and indoor  options.  Pt  voiced understanding.         Exercise Goals and Review: Exercise Goals    Row Name 05/02/18 1501             Exercise Goals   Increase Physical Activity  Yes       Intervention  Provide advice, education, support and counseling about physical activity/exercise needs.;Develop an individualized exercise prescription for aerobic and resistive training based on initial evaluation findings, risk stratification, comorbidities and participant's personal goals.       Expected Outcomes  Short Term: Attend rehab on a regular basis to increase amount of physical activity.;Long Term: Add in home exercise to make exercise part of routine and to increase amount of physical activity.;Long Term: Exercising regularly at least 3-5 days a week.       Increase Strength and Stamina  Yes       Intervention  Provide advice, education, support and counseling about physical activity/exercise needs.;Develop an individualized exercise prescription for aerobic and resistive training based on initial evaluation findings, risk stratification, comorbidities and participant's personal goals.       Expected Outcomes  Short Term: Increase workloads from initial exercise prescription for resistance, speed, and METs.;Short Term: Perform resistance training exercises routinely during rehab and add in resistance training at home;Long Term: Improve cardiorespiratory fitness, muscular endurance and strength as measured by increased METs and functional capacity (6MWT)       Able to understand and use rate of perceived exertion (RPE) scale  Yes       Intervention  Provide education and explanation on how to use RPE scale       Expected Outcomes  Short Term: Able to use RPE daily in rehab to express subjective intensity level;Long Term:  Able to use RPE to guide intensity level when exercising independently       Knowledge and understanding of Target Heart Rate Range (THRR)  Yes       Intervention  Provide education and  explanation of THRR including how the numbers were predicted and where they are located for reference       Expected Outcomes  Short Term: Able to state/look up THRR;Short Term: Able to use daily as guideline for intensity in rehab;Long Term: Able to use THRR to govern intensity when exercising independently       Able to check pulse independently  Yes       Intervention  Provide education and demonstration on how to check pulse in carotid and radial arteries.;Review the importance of being able to check your own pulse for safety during independent exercise       Expected Outcomes  Short Term: Able to explain why pulse checking is important during independent exercise;Long Term: Able to check pulse independently and accurately       Understanding of Exercise Prescription  Yes       Intervention  Provide education, explanation, and written materials on patient's individual exercise prescription       Expected Outcomes  Short Term: Able to explain program exercise prescription;Long Term: Able to explain home exercise prescription to exercise independently          Exercise Goals Re-Evaluation : Exercise Goals Re-Evaluation    Row Name 05/03/18 1109 05/10/18 1559 05/12/18 1054 05/23/18 1610       Exercise Goal Re-Evaluation   Exercise Goals Review  Increase Physical Activity;Increase Strength and Stamina;Able to understand and use rate of perceived exertion (RPE) scale;Knowledge and understanding of Target Heart Rate Range (THRR);Understanding  of Exercise Prescription  Increase Physical Activity;Increase Strength and Stamina;Understanding of Exercise Prescription  Increase Physical Activity;Increase Strength and Stamina;Able to understand and use rate of perceived exertion (RPE) scale;Knowledge and understanding of Target Heart Rate Range (THRR);Able to check pulse independently  Increase Physical Activity;Increase Strength and Stamina;Understanding of Exercise Prescription    Comments  Reviewed RPE  scale, THR and program prescription with pt today.  Pt voiced understanding and was given a copy of goals to take home.   Alyas is off to a good start in rehab.  He has completed his first three full days of exercise.  He is now up to level 4 on the NuStep. We will continue to moniotr his progression.    Reviewed home exercise with pt today.  Pt plans to use gym at apartment complex for exercise.  Reviewed THR, pulse, RPE, sign and symptoms, NTG use, and when to call 911 or MD.  Also discussed weather considerations and indoor options.  Pt voiced understanding.  Takoda is doing well in rehab.  He is now up to 3.1 METs on the XR.  We will continue to monitor his workloads.     Expected Outcomes  Short: Use RPE daily to regulate intensity. Long: Follow program prescription in THR.  Short: Increase workloads and review home exercise guidelines.  Long: Continue to follow program prescription.   Short - increase walking time to 30 min from 15 - practice taking HR Long - maintain exercise on his own  Short: Increase T5 NuStep.  Long: Continue to increase strength and stamina.        Discharge Exercise Prescription (Final Exercise Prescription Changes): Exercise Prescription Changes - 05/23/18 1600      Response to Exercise   Blood Pressure (Admit)  118/62    Blood Pressure (Exercise)  168/78    Blood Pressure (Exit)  100/72    Heart Rate (Admit)  75 bpm    Heart Rate (Exercise)  113 bpm    Heart Rate (Exit)  81 bpm    Rating of Perceived Exertion (Exercise)  12    Symptoms  none    Duration  Continue with 30 min of aerobic exercise without signs/symptoms of physical distress.    Intensity  THRR unchanged      Progression   Progression  Continue to progress workloads to maintain intensity without signs/symptoms of physical distress.    Average METs  2.63      Resistance Training   Training Prescription  Yes    Weight  4 lbs    Reps  10-15      Interval Training   Interval Training  No       Treadmill   MPH  1.9    Grade  0.5    Minutes  15    METs  2.59      REL-XR   Level  3    Minutes  15    METs  3.1      T5 Nustep   Level  4    Minutes  15    METs  2.2      Home Exercise Plan   Plans to continue exercise at  Home (comment)   gym in complex   Frequency  Add 3 additional days to program exercise sessions.    Initial Home Exercises Provided  05/12/18       Nutrition:  Target Goals: Understanding of nutrition guidelines, daily intake of sodium <1523m, cholesterol <2031m calories 30% from  fat and 7% or less from saturated fats, daily to have 5 or more servings of fruits and vegetables.  Biometrics: Pre Biometrics - 05/02/18 1502      Pre Biometrics   Height  5' 11.1" (1.806 m)    Weight  202 lb 8 oz (91.9 kg)    Waist Circumference  40 inches    Hip Circumference  38 inches    Waist to Hip Ratio  1.05 %    BMI (Calculated)  28.16    Single Leg Stand  8.07 seconds        Nutrition Therapy Plan and Nutrition Goals: Nutrition Therapy & Goals - 05/03/18 1108      Nutrition Therapy   Diet  TLC    Drug/Food Interactions  Statins/Certain Fruits    Protein (specify units)  9oz    Fiber  30 grams    Whole Grain Foods  3 servings   chooses whole grain bread   Saturated Fats  15 max. grams    Fruits and Vegetables  5 servings/day   8 ideal; eats more vegetables than fruits   Sodium  1500 grams      Personal Nutrition Goals   Nutrition Goal  Include at least one additional serving of fish per week    Personal Goal #2  Try breakfast options that are higher in fiber and lower in saturated/trans fats. For example, swap your sausage + egg biscuit for canadian bacon + egg english muffin or whole grain bread sandwich or choosing oatmeal instead.     Comments  He feels that there are some areas of his diet that he and his wife couple improve, particularly with the types of fats and proteins they eat. His usual breakfast is cereal or a sausage and egg biscuit  sandwich. For lunch he eats sandwiches on wheat bread or salads. For dinners they eat at home 3 days/week and eat out 4 days/week typically. At home his wife cooks chicken or occasionally steak with vegetables / salad and sometimes starches like baked idaho/sweet potatoes or pasta. If eating out they try to choose restaurants rather than fast food; recently [place] and [employer]. He does not add salt to meals post-preparation. Beverages: water, juice, diet soda, non-fat milk. They do not typically buy canned foods or pre-packaged meals. Snack foods include popcorn, cheese crackers, or yogurt.      Intervention Plan   Intervention  Prescribe, educate and counsel regarding individualized specific dietary modifications aiming towards targeted core components such as weight, hypertension, lipid management, diabetes, heart failure and other comorbidities.;Nutrition handout(s) given to patient.   General heart healthy eating powerpoint packet   Expected Outcomes  Short Term Goal: A plan has been developed with personal nutrition goals set during dietitian appointment.;Long Term Goal: Adherence to prescribed nutrition plan.;Short Term Goal: Understand basic principles of dietary content, such as calories, fat, sodium, cholesterol and nutrients.       Nutrition Assessments: Nutrition Assessments - 05/02/18 1346      MEDFICTS Scores   Pre Score  28       Nutrition Goals Re-Evaluation: Nutrition Goals Re-Evaluation    Row Name 05/19/18 1007             Goals   Nutrition Goal  Include at least one additional serving of fish per week; Try breakfast options that are higher in fiber and lower in saturated / trans fats.       Comment  He and his wife have been buying  more fish to eat but he has not yet tried different breakfast options. They have not been going out to eat much but try to make healthier choices such as salads when they do       Expected Outcome  They will maintain increased intake of fish as  opposed to less lean protein sources. Long term goal: trial breakfast options that are lower in saturated and trans fats          Nutrition Goals Discharge (Final Nutrition Goals Re-Evaluation): Nutrition Goals Re-Evaluation - 05/19/18 1007      Goals   Nutrition Goal  Include at least one additional serving of fish per week; Try breakfast options that are higher in fiber and lower in saturated / trans fats.    Comment  He and his wife have been buying more fish to eat but he has not yet tried different breakfast options. They have not been going out to eat much but try to make healthier choices such as salads when they do    Expected Outcome  They will maintain increased intake of fish as opposed to less lean protein sources. Long term goal: trial breakfast options that are lower in saturated and trans fats       Psychosocial: Target Goals: Acknowledge presence or absence of significant depression and/or stress, maximize coping skills, provide positive support system. Participant is able to verbalize types and ability to use techniques and skills needed for reducing stress and depression.   Initial Review & Psychosocial Screening: Initial Psych Review & Screening - 05/02/18 1342      Initial Review   Current issues with  Current Stress Concerns;History of Depression    Source of Stress Concerns  Family    Comments  Mr. Alsop son struggles with Bipolar. He is currently homeless, in and out of the hospital and on and off his meds. Mr. Siebenaler and his wife reported this is a major stressful for their family. Mrs. Canipe expressed concern for her husband that before they moved from [facility] Mr. Anfinson was seeing a psychiatrist. His psychiatrist had taken him off his depression medications because he was doing so well. They both are concerned he may need to go back on them since his heart surgery and recent events with his son. They were encouraged to reach out to his past psychiatrist to either see  him or be referred to someone local.       [place]?  Yes   wife, friends     Barriers   Psychosocial barriers to participate in program  The patient should benefit from training in stress management and relaxation.      Screening Interventions   Interventions  Encouraged to exercise;Program counselor consult;To provide support and resources with identified psychosocial needs;Provide feedback about the scores to participant    Expected Outcomes  Short Term goal: Utilizing psychosocial counselor, staff and physician to assist with identification of specific Stressors or current issues interfering with healing process. Setting desired goal for each stressor or current issue identified.;Long Term Goal: Stressors or current issues are controlled or eliminated.;Short Term goal: Identification and review with participant of any Quality of Life or Depression concerns found by scoring the questionnaire.;Long Term goal: The participant improves quality of Life and PHQ9 Scores as seen by post scores and/or verbalization of changes       Quality of Life Scores:  Quality of Life - 05/02/18 1346      Quality  of Life   Select  Quality of Life      Quality of Life Scores   Health/Function Pre  15.85 %    Socioeconomic Pre  24 %    Psych/Spiritual Pre  17.07 %    Family Pre  16.8 %    GLOBAL Pre  17.85 %      Scores of 19 and below usually indicate a poorer quality of life in these areas.  A difference of  2-3 points is a clinically meaningful difference.  A difference of 2-3 points in the total score of the Quality of Life Index has been associated with significant improvement in overall quality of life, self-image, physical symptoms, and general health in studies assessing change in quality of life.  PHQ-9: Recent Review Flowsheet Data    Depression screen [facility] 2/9 05/02/2018 04/14/2018   Decreased Interest 1 0   Down, Depressed, Hopeless 2 0   PHQ - 2 Score 3 0    Altered sleeping 0 -   Tired, decreased energy 1 -   Change in appetite 1 -   Feeling bad or failure about yourself  0 -   Trouble concentrating 0 -   Moving slowly or fidgety/restless 0 -   Suicidal thoughts 1  -   PHQ-9 Score 6 -   Difficult doing work/chores Somewhat difficult -     Interpretation of Total Score  Total Score Depression Severity:  1-4 = Minimal depression, 5-9 = Mild depression, 10-14 = Moderate depression, 15-19 = Moderately severe depression, 20-27 = Severe depression   Psychosocial Evaluation and Intervention: Psychosocial Evaluation - 05/20/18 1416      Psychosocial Evaluation & Interventions   Interventions  Stress management education;Encouraged to exercise with the program and follow exercise prescription;Therapist referral    Comments  Counselor met with Mr. [facility]elhol[facility]) for initial psychosocial evaluation.  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  Keller has a strong support system with a spouse of 74 years; (2) sons locally and active involvement in his local church.  He reports sleeping well with the use of medication and has a good appetite.   Zaquan reports a history of depression and was on medications for ~(1) year but was discontinued by his psychiatrist recently because he was doing so well.  However, He admits that he has been so discouraged lately that he has had thoughts of being better off dead.  Counselor assessed with Ardyth Gal reporting no current plan and he "would not do that."  He reports his bipolar son has been verbally abusive to he and his wife often lately.  His son has also been hospitalized multiple times and will not stay on his medications.  This is stressful for Aurelio and his son is also a financial burden as they are paying for him to stay in a "cheap motel" to keep him off the streets.  Nyeem realizes this is impacting his recovery in a negative way.  Counselor encouraged him to attend [facility] family support groups designed to  help families cope with mental illness in their loved ones.  He is planning to go to a meeting tonight.  Also, counselor provided Seamus with a (35 page) resource guide for possible shelters, etc. etc for his son.  Counselor provided local therapist options for Kyland and his wife to be supported through this difficult time.  Dionte has been encouraged by staff to consult with his psychiatrist to possibly begin his medications for  his mood back due to his current condition and how "difficult the holidays are" for their family to be around this son.  Din has goals to increase his stamina and strength while in this program and hopefully manage his stress better as well.  Counselor will follow with Ardyth Gal while in this program.      Expected Outcomes  Short:  Alejandro will contact his psychiatrist for a medication evaluation due to increased current stressors.  Silvano and his spouse will find a therapist for themselves and attend support groups to be able to cope with their mentally ill son.  Counselor will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

## 2018-06-07 DIAGNOSIS — Z951 Presence of aortocoronary bypass graft: Secondary | ICD-10-CM | POA: Diagnosis not present

## 2018-06-07 DIAGNOSIS — Z7982 Long term (current) use of aspirin: Secondary | ICD-10-CM | POA: Diagnosis not present

## 2018-06-07 DIAGNOSIS — Z7902 Long term (current) use of antithrombotics/antiplatelets: Secondary | ICD-10-CM | POA: Diagnosis not present

## 2018-06-07 DIAGNOSIS — I252 Old myocardial infarction: Secondary | ICD-10-CM | POA: Diagnosis not present

## 2018-06-07 DIAGNOSIS — Z79899 Other long term (current) drug therapy: Secondary | ICD-10-CM | POA: Diagnosis not present

## 2018-06-07 DIAGNOSIS — I2510 Atherosclerotic heart disease of native coronary artery without angina pectoris: Secondary | ICD-10-CM | POA: Diagnosis not present

## 2018-06-07 NOTE — Progress Notes (Signed)
Daily Session Note  Patient Details  Name: David Mccullough MRN: [id number] Date of Birth: May 20, 1946 Referring Provider:     Cardiac Rehab from 05/02/2018 in [facility] Cardiac and Pulmonary Rehab  Referring Provider  [name] MD [Local Cardiologist: [name], MD]      Encounter Date: 06/07/2018  Check In: Session Check In - 06/07/18 0921      Check-In   Supervising physician immediately available to respond to emergencies  See telemetry face sheet for immediately available ER MD    Location  [facility]-Cardiac & Pulmonary Rehab    Staff Present  Heath Lark, RN, BSN, CCRP;Jessica Luan Pulling, MA, RCEP, CCRP, Exercise Physiologist;Grabiel Schmutz BS, Exercise Physiologist;Joseph Tessie Fass RCP,RRT,BSRT    Medication changes reported      No    Fall or balance concerns reported     No    Tobacco Cessation  No Change    Warm-up and Cool-down  Performed as group-led instruction    Resistance Training Performed  Yes    VAD Patient?  No    PAD/SET Patient?  No      Pain Assessment   Currently in Pain?  No/denies          Social History   Tobacco Use  Smoking Status Former Smoker  . Packs/day: 1.00  . Years: 25.00  . Pack years: 25.00  . Types: Cigarettes  . Last attempt to quit: 06/30/1987  . Years since quitting: 30.9  Smokeless Tobacco Never Used    Goals Met:  Independence with exercise equipment Exercise tolerated well No report of cardiac concerns or symptoms Strength training completed today  Goals Unmet:  Not Applicable  Comments: Pt able to follow exercise prescription today without complaint.  Will continue to monitor for progression.   Dr. Emily Filbert is Medical Director for [place] and [name].

## 2018-06-09 DIAGNOSIS — Z7982 Long term (current) use of aspirin: Secondary | ICD-10-CM | POA: Diagnosis not present

## 2018-06-09 DIAGNOSIS — Z951 Presence of aortocoronary bypass graft: Secondary | ICD-10-CM | POA: Diagnosis not present

## 2018-06-09 DIAGNOSIS — Z79899 Other long term (current) drug therapy: Secondary | ICD-10-CM | POA: Diagnosis not present

## 2018-06-09 DIAGNOSIS — I2510 Atherosclerotic heart disease of native coronary artery without angina pectoris: Secondary | ICD-10-CM | POA: Diagnosis not present

## 2018-06-09 DIAGNOSIS — Z7902 Long term (current) use of antithrombotics/antiplatelets: Secondary | ICD-10-CM | POA: Diagnosis not present

## 2018-06-09 DIAGNOSIS — I252 Old myocardial infarction: Secondary | ICD-10-CM | POA: Diagnosis not present

## 2018-06-09 NOTE — Progress Notes (Signed)
Daily Session Note  Patient Details  Name: Bryley Kovacevic MRN: [id number] Date of Birth: 1946/02/12 Referring Provider:     Cardiac Rehab from 05/02/2018 in [facility] Cardiac and Pulmonary Rehab  Referring Provider  [name] MD [Local Cardiologist: [name], MD]      Encounter Date: 06/09/2018  Check In: Session Check In - 06/09/18 0928      Check-In   Supervising physician immediately available to respond to emergencies  See telemetry face sheet for immediately available ER MD    Location  [facility]-Cardiac & Pulmonary Rehab    Staff Present  Gerlene Burdock, RN, BSN;Jeanna Durrell BS, Exercise Physiologist;Jessica [place], MA, RCEP, CCRP, Exercise Physiologist;Joseph Tessie Fass RCP,RRT,BSRT    Medication changes reported      No    Fall or balance concerns reported     No    Tobacco Cessation  No Change    Warm-up and Cool-down  Performed as group-led instruction    Resistance Training Performed  Yes    VAD Patient?  No    PAD/SET Patient?  No      Pain Assessment   Currently in Pain?  No/denies          Social History   Tobacco Use  Smoking Status Former Smoker  . Packs/day: 1.00  . Years: 25.00  . Pack years: 25.00  . Types: Cigarettes  . Last attempt to quit: 06/30/1987  . Years since quitting: 30.9  Smokeless Tobacco Never Used    Goals Met:  Independence with exercise equipment Exercise tolerated well No report of cardiac concerns or symptoms Strength training completed today  Goals Unmet:  Not Applicable  Comments: Pt able to follow exercise prescription today without complaint.  Will continue to monitor for progression.    Dr. Emily Filbert is Medical Director for [place] and [name].

## 2018-06-14 DIAGNOSIS — Z951 Presence of aortocoronary bypass graft: Secondary | ICD-10-CM

## 2018-06-14 DIAGNOSIS — Z79899 Other long term (current) drug therapy: Secondary | ICD-10-CM | POA: Diagnosis not present

## 2018-06-14 DIAGNOSIS — I252 Old myocardial infarction: Secondary | ICD-10-CM | POA: Diagnosis not present

## 2018-06-14 DIAGNOSIS — I2510 Atherosclerotic heart disease of native coronary artery without angina pectoris: Secondary | ICD-10-CM | POA: Diagnosis not present

## 2018-06-14 DIAGNOSIS — Z7982 Long term (current) use of aspirin: Secondary | ICD-10-CM | POA: Diagnosis not present

## 2018-06-14 DIAGNOSIS — Z7902 Long term (current) use of antithrombotics/antiplatelets: Secondary | ICD-10-CM | POA: Diagnosis not present

## 2018-06-14 NOTE — Progress Notes (Signed)
Daily Session Note  Patient Details  Name: David Mccullough MRN: [id number] Date of Birth: [birth year] Referring Provider:     Cardiac Rehab from 05/02/2018 in [facility] Cardiac and Pulmonary Rehab  Referring Provider  [name] MD [Local Cardiologist: [name], MD]      Encounter Date: 06/14/2018  Check In: Session Check In - 06/14/18 0922      Check-In   Supervising physician immediately available to respond to emergencies  See telemetry face sheet for immediately available ER MD    Location  [facility]-Cardiac & Pulmonary Rehab    Staff Present  Darel Hong, RN BSN;Jeanna Durrell BS, Exercise Physiologist;Jessica Luan Pulling, MA, RCEP, CCRP, Exercise Physiologist    Medication changes reported      No    Fall or balance concerns reported     No    Tobacco Cessation  No Change    Warm-up and Cool-down  Performed as group-led instruction    Resistance Training Performed  Yes    VAD Patient?  No    PAD/SET Patient?  No      Pain Assessment   Currently in Pain?  No/denies    Multiple Pain Sites  No          Social History   Tobacco Use  Smoking Status Former Smoker  . Packs/day: 1.00  . Years: 25.00  . Pack years: 25.00  . Types: Cigarettes  . Last attempt to quit: 06/30/1987  . Years since quitting: 30.9  Smokeless Tobacco Never Used    Goals Met:  Independence with exercise equipment Exercise tolerated well No report of cardiac concerns or symptoms Strength training completed today  Goals Unmet:  Not Applicable  Comments: Pt able to follow exercise prescription today without complaint.  Will continue to monitor for progression.    Dr. Emily Filbert is Medical Director for [place] and [name].

## 2018-06-16 DIAGNOSIS — Z951 Presence of aortocoronary bypass graft: Secondary | ICD-10-CM | POA: Diagnosis not present

## 2018-06-16 DIAGNOSIS — Z7982 Long term (current) use of aspirin: Secondary | ICD-10-CM | POA: Diagnosis not present

## 2018-06-16 DIAGNOSIS — Z7902 Long term (current) use of antithrombotics/antiplatelets: Secondary | ICD-10-CM | POA: Diagnosis not present

## 2018-06-16 DIAGNOSIS — Z79899 Other long term (current) drug therapy: Secondary | ICD-10-CM | POA: Diagnosis not present

## 2018-06-16 DIAGNOSIS — I252 Old myocardial infarction: Secondary | ICD-10-CM | POA: Diagnosis not present

## 2018-06-16 DIAGNOSIS — I2510 Atherosclerotic heart disease of native coronary artery without angina pectoris: Secondary | ICD-10-CM | POA: Diagnosis not present

## 2018-06-16 NOTE — Progress Notes (Signed)
Daily Session Note  Patient Details  Name: David Mccullough MRN: [id number] Date of Birth: 1946-05-11 Referring Provider:     Cardiac Rehab from 05/02/2018 in [facility] Cardiac and Pulmonary Rehab  Referring Provider  [name] MD [Local Cardiologist: [name], MD]      Encounter Date: 06/16/2018  Check In: Session Check In - 06/16/18 0914      Check-In   Supervising physician immediately available to respond to emergencies  See telemetry face sheet for immediately available ER MD    Location  [facility]-Cardiac & Pulmonary Rehab    Staff Present  Gerlene Burdock, RN, BSN;Leslie Castrejon RN, BSN;Jeanna Durrell BS, Exercise Physiologist;Jessica [place], MA, RCEP, CCRP, Exercise Physiologist    Medication changes reported      No    Fall or balance concerns reported     No    Tobacco Cessation  No Change    Warm-up and Cool-down  Performed as group-led instruction    Resistance Training Performed  Yes    VAD Patient?  No    PAD/SET Patient?  No      Pain Assessment   Currently in Pain?  No/denies          Social History   Tobacco Use  Smoking Status Former Smoker  . Packs/day: 1.00  . Years: 25.00  . Pack years: 25.00  . Types: Cigarettes  . Last attempt to quit: 06/30/1987  . Years since quitting: 30.9  Smokeless Tobacco Never Used    Goals Met:  Independence with exercise equipment Exercise tolerated well No report of cardiac concerns or symptoms Strength training completed today  Goals Unmet:  Not Applicable  Comments: Pt able to follow exercise prescription today without complaint.  Will continue to monitor for progression.    Dr. Emily Filbert is Medical Director for [place] and [name].

## 2018-06-21 ENCOUNTER — Encounter: Payer: Medicare Other | Admitting: *Deleted

## 2018-06-21 DIAGNOSIS — I252 Old myocardial infarction: Secondary | ICD-10-CM | POA: Diagnosis not present

## 2018-06-21 DIAGNOSIS — Z79899 Other long term (current) drug therapy: Secondary | ICD-10-CM | POA: Diagnosis not present

## 2018-06-21 DIAGNOSIS — I2510 Atherosclerotic heart disease of native coronary artery without angina pectoris: Secondary | ICD-10-CM | POA: Diagnosis not present

## 2018-06-21 DIAGNOSIS — Z951 Presence of aortocoronary bypass graft: Secondary | ICD-10-CM

## 2018-06-21 DIAGNOSIS — Z7982 Long term (current) use of aspirin: Secondary | ICD-10-CM | POA: Diagnosis not present

## 2018-06-21 DIAGNOSIS — Z7902 Long term (current) use of antithrombotics/antiplatelets: Secondary | ICD-10-CM | POA: Diagnosis not present

## 2018-06-21 NOTE — Progress Notes (Signed)
Daily Session Note  Patient Details  Name: David Mccullough MRN: [id number] Date of Birth: [birth year] Referring Provider:     Cardiac Rehab from 05/02/2018 in [facility] Cardiac and Pulmonary Rehab  Referring Provider  [name] MD [Local Cardiologist: [name], MD]      Encounter Date: 06/21/2018  Check In: Session Check In - 06/21/18 0853      Check-In   Supervising physician immediately available to respond to emergencies  See telemetry face sheet for immediately available ER MD    Location  [facility]-Cardiac & Pulmonary Rehab    Staff Present  Heath Lark, RN, BSN, CCRP;Jessica [place], MA, RCEP, CCRP, Exercise Physiologist;Amanda Oletta Darter, BA, ACSM CEP, Exercise Physiologist    Medication changes reported      No    Fall or balance concerns reported     No    Warm-up and Cool-down  Performed as group-led instruction    Resistance Training Performed  Yes    VAD Patient?  No    PAD/SET Patient?  No      Pain Assessment   Currently in Pain?  No/denies          Social History   Tobacco Use  Smoking Status Former Smoker  . Packs/day: 1.00  . Years: 25.00  . Pack years: 25.00  . Types: Cigarettes  . Last attempt to quit: 06/30/1987  . Years since quitting: 30.9  Smokeless Tobacco Never Used    Goals Met:  Independence with exercise equipment Exercise tolerated well No report of cardiac concerns or symptoms Strength training completed today  Goals Unmet:  Not Applicable  Comments: Pt able to follow exercise prescription today without complaint.  Will continue to monitor for progression.    Dr. Emily Filbert is Medical Director for [place] and [name].

## 2018-06-23 VITALS — Ht 71.1 in | Wt 205.0 lb

## 2018-06-23 DIAGNOSIS — Z7982 Long term (current) use of aspirin: Secondary | ICD-10-CM | POA: Diagnosis not present

## 2018-06-23 DIAGNOSIS — I2510 Atherosclerotic heart disease of native coronary artery without angina pectoris: Secondary | ICD-10-CM | POA: Diagnosis not present

## 2018-06-23 DIAGNOSIS — Z951 Presence of aortocoronary bypass graft: Secondary | ICD-10-CM | POA: Diagnosis not present

## 2018-06-23 DIAGNOSIS — Z7902 Long term (current) use of antithrombotics/antiplatelets: Secondary | ICD-10-CM | POA: Diagnosis not present

## 2018-06-23 DIAGNOSIS — I252 Old myocardial infarction: Secondary | ICD-10-CM | POA: Diagnosis not present

## 2018-06-23 DIAGNOSIS — Z79899 Other long term (current) drug therapy: Secondary | ICD-10-CM | POA: Diagnosis not present

## 2018-06-23 NOTE — Progress Notes (Signed)
Daily Session Note  Patient Details  Name: David Mccullough MRN: [id number] Date of Birth: [birth year] Referring Provider:     Cardiac Rehab from 05/02/2018 in [facility]  Referring Provider  [name] MD [Local Cardiologist: [name], MD]      Encounter Date: 06/23/2018  Check In:      Social History   Tobacco Use  Smoking Status Former Smoker  . Packs/day: 1.00  . Years: 25.00  . Pack years: 25.00  . Types: Cigarettes  . Last attempt to quit: 06/30/1987  . Years since quitting: 31.0  Smokeless Tobacco Never Used    Goals Met:  Independence with exercise equipment Exercise tolerated well No report of cardiac concerns or symptoms Strength training completed today  Goals Unmet:  Not Applicable  Comments: Pt able to follow exercise prescription today without complaint.  Will continue to monitor for progression.  6 Minute Walk    Row Name 05/02/18 1456 06/23/18 1228       6 Minute Walk   Phase  Initial  Discharge    Distance  1264 feet  1540 feet    Distance % Change  -  21.8 %    Distance Feet Change  -  276 ft    Walk Time  6 minutes  6 minutes    MPH  2.39  2.92    METS  2.59  3.34    RPE  11  11    VO2 Peak  9.06  11.7    Symptoms  No  No    Resting HR  76 bpm  94 bpm    Resting BP  136/66  126/60    Resting Oxygen Saturation   100 %  -    Exercise Oxygen Saturation  during 6 min walk  98 %  -    Max Ex. HR  81 bpm  117 bpm    Max Ex. BP  144/70  134/62    2 Minute Post BP  136/62  -        Dr. Emily Filbert is Medical Director for [place] and [name].

## 2018-06-28 ENCOUNTER — Encounter: Payer: Self-pay | Admitting: *Deleted

## 2018-06-28 DIAGNOSIS — Z79899 Other long term (current) drug therapy: Secondary | ICD-10-CM | POA: Diagnosis not present

## 2018-06-28 DIAGNOSIS — I252 Old myocardial infarction: Secondary | ICD-10-CM | POA: Diagnosis not present

## 2018-06-28 DIAGNOSIS — Z7982 Long term (current) use of aspirin: Secondary | ICD-10-CM | POA: Diagnosis not present

## 2018-06-28 DIAGNOSIS — Z951 Presence of aortocoronary bypass graft: Secondary | ICD-10-CM

## 2018-06-28 DIAGNOSIS — I2510 Atherosclerotic heart disease of native coronary artery without angina pectoris: Secondary | ICD-10-CM | POA: Diagnosis not present

## 2018-06-28 DIAGNOSIS — Z7902 Long term (current) use of antithrombotics/antiplatelets: Secondary | ICD-10-CM | POA: Diagnosis not present

## 2018-06-28 NOTE — Progress Notes (Signed)
Cardiac Individual Treatment Plan  Patient Details  Name: David Mccullough MRN: [id number] Date of Birth: [birth year] Referring Provider:     Cardiac Rehab from 05/02/2018 in [facility] Cardiac and Pulmonary Rehab  Referring Provider  [name] MD [Local Cardiologist: [name], MD]      Initial Encounter Date:    Cardiac Rehab from 05/02/2018 in [facility] Cardiac and Pulmonary Rehab  Date  05/02/18      Visit Diagnosis: S/P CABG x 3  Patient's Home Medications on Admission:  Current Outpatient Medications:  .  aspirin 81 MG chewable tablet, Chew 81 mg by mouth at bedtime. , Disp: , Rfl:  .  atorvastatin (LIPITOR) 80 MG tablet, Take 1 tablet (80 mg total) by mouth daily at 6 PM., Disp: 30 tablet, Rfl: 2 .  Calcium Carb-Cholecalciferol (CALCIUM 1000 + D PO), Take 1 tablet by mouth daily. , Disp: , Rfl:  .  clopidogrel (PLAVIX) 75 MG tablet, TAKE 1 TABLET BY MOUTH DAILY, Disp: 30 tablet, Rfl: 2 .  furosemide (LASIX) 40 MG tablet, Take 1 tablet (40 mg total) by mouth daily as needed. only on days that you gain 3 pounds. Weigh yourself each morning, Disp: 30 tablet, Rfl: 0 .  Multiple Vitamin (MULTIVITAMIN) tablet, Take 1 tablet by mouth daily., Disp: , Rfl:  .  Omega-3 Fatty Acids (FISH OIL) 1200 MG CAPS, Take 1 capsule by mouth daily. , Disp: , Rfl:  .  potassium chloride SA (K-DUR,KLOR-CON) 20 MEQ tablet, Take 1 tablet (20 mEq total) by mouth daily as needed. Only on days that you take a dose of lasix, Disp: 30 tablet, Rfl: 0 .  spironolactone (ALDACTONE) 25 MG tablet, Take 1 tablet (25 mg total) by mouth daily., Disp: 30 tablet, Rfl: 2 .  traZODone (DESYREL) 50 MG tablet, Take 1 tablet (50 mg total) by mouth at bedtime., Disp: 30 tablet, Rfl: 4  Past Medical History: Past Medical History:  Diagnosis Date  . CAD (coronary artery disease)   . Colon polyps   . Depression   . Elevated PSA   . Myocardial infarction ([place])    x 2  . Persistent atrial fibrillation   . Tachycardia      Tobacco Use: Social History   Tobacco Use  Smoking Status Former Smoker  . Packs/day: 1.00  . Years: 25.00  . Pack years: 25.00  . Types: Cigarettes  . Last attempt to quit: 06/30/1987  . Years since quitting: 31.0  Smokeless Tobacco Never Used    Labs: Recent Review Flowsheet Data    Labs for ITP Cardiac and Pulmonary Rehab Latest Ref Rng & Units 03/27/2018 03/28/2018 03/29/2018 03/29/2018 03/30/2018   Cholestrol 0 - 200 mg/dL - - - - -   LDLCALC 0 - 99 mg/dL - - - - -   HDL >40 mg/dL - - - - -   Trlycerides <150 mg/dL - - - - -   Hemoglobin A1c 4.8 - 5.6 % - - - - -   PHART 7.350 - 7.450 - - - - -   PCO2ART 32.0 - 48.0 mmHg - - - - -   HCO3 20.0 - 28.0 mmol/L - - - - -   TCO2 22 - 32 mmol/L - - - - -   ACIDBASEDEF 0.0 - 2.0 mmol/L - - - - -   O2SAT % 54.1 56.6 49.4 48.3 51.2       Exercise Target Goals: Exercise Program Goal: Individual exercise prescription set using results from initial 6  min walk test and THRR while considering  patient's activity barriers and safety.   Exercise Prescription Goal: Initial exercise prescription builds to 30-45 minutes a day of aerobic activity, 2-3 days per week.  Home exercise guidelines will be given to patient during program as part of exercise prescription that the participant will acknowledge.  Activity Barriers & Risk Stratification: Activity Barriers & Cardiac Risk Stratification - 05/02/18 1347      Activity Barriers & Cardiac Risk Stratification   Activity Barriers  Deconditioning;Muscular Weakness    Cardiac Risk Stratification  High       6 Minute Walk: 6 Minute Walk    Row Name 05/02/18 1456 06/23/18 1228       6 Minute Walk   Phase  Initial  Discharge    Distance  1264 feet  1540 feet    Distance % Change  -  21.8 %    Distance Feet Change  -  276 ft    Walk Time  6 minutes  6 minutes    MPH  2.39  2.92    METS  2.59  3.34    RPE  11  11    VO2 Peak  9.06  11.7    Symptoms  No  No    Resting HR  76 bpm   94 bpm    Resting BP  136/66  126/60    Resting Oxygen Saturation   100 %  -    Exercise Oxygen Saturation  during 6 min walk  98 %  -    Max Ex. HR  81 bpm  117 bpm    Max Ex. BP  144/70  134/62    2 Minute Post BP  136/62  -       Oxygen Initial Assessment:   Oxygen Re-Evaluation:   Oxygen Discharge (Final Oxygen Re-Evaluation):   Initial Exercise Prescription: Initial Exercise Prescription - 05/02/18 1400      Date of Initial Exercise RX and Referring Provider   Date  05/02/18    Referring Provider  [name] MD   Local Cardiologist: [name], MD     Treadmill   MPH  1.9    Grade  0.5    Minutes  15    METs  2.59      REL-XR   Level  1    Speed  50    Minutes  15    METs  2.5      T5 Nustep   Level  1    SPM  80    Minutes  15    METs  2.5      Prescription Details   Frequency (times per week)  2    Duration  Progress to 30 minutes of continuous aerobic without signs/symptoms of physical distress      Intensity   THRR 40-80% of Max Heartrate  104-133    Ratings of Perceived Exertion  11-13    Perceived Dyspnea  0-4      Progression   Progression  Continue to progress workloads to maintain intensity without signs/symptoms of physical distress.      Resistance Training   Training Prescription  Yes    Weight  4 lbs    Reps  10-15       Perform Capillary Blood Glucose checks as needed.  Exercise Prescription Changes: Exercise Prescription Changes    Row Name 05/02/18 1300 05/10/18 1600 05/23/18 1600 06/07/18 1500 06/20/18 1600  Response to Exercise   Blood Pressure (Admit)  136/66  124/64  118/62  112/70  104/68   Blood Pressure (Exercise)  144/70  136/78  168/78  130/60  148/80   Blood Pressure (Exit)  136/62  114/60  100/72  126/62  104/60   Heart Rate (Admit)  76 bpm  71 bpm  75 bpm  82 bpm  76 bpm   Heart Rate (Exercise)  81 bpm  115 bpm  113 bpm  116 bpm  111 bpm   Heart Rate (Exit)  74 bpm  85 bpm  81 bpm  86 bpm  77  bpm   Oxygen Saturation (Admit)  100 %  -  -  -  -   Oxygen Saturation (Exercise)  98 %  -  -  -  -   Rating of Perceived Exertion (Exercise)  _0 Symptoms  none  none  none  none  none   Comments  walk test results  -  -  -  -   Duration  -  Continue with 30 min of aerobic exercise without signs/symptoms of physical distress.  Continue with 30 min of aerobic exercise without signs/symptoms of physical distress.  Continue with 30 min of aerobic exercise without signs/symptoms of physical distress.  Continue with 30 min of aerobic exercise without signs/symptoms of physical distress.   Intensity  -  THRR unchanged  THRR unchanged  THRR unchanged  THRR unchanged     Progression   Progression  -  Continue to progress workloads to maintain intensity without signs/symptoms of physical distress.  Continue to progress workloads to maintain intensity without signs/symptoms of physical distress.  Continue to progress workloads to maintain intensity without signs/symptoms of physical distress.  Continue to progress workloads to maintain intensity without signs/symptoms of physical distress.   Average METs  -  2.13  2.63  3.55  3.74     Resistance Training   Training Prescription  -  Yes  Yes  Yes  Yes   Weight  -  4 lbs  4 lbs  5 lbs  5 lbs   Reps  -  10-15  10-15  10-15  10-15     Interval Training   Interval Training  -  No  No  No  No     Treadmill   MPH  -  1.9  1._1 Grade  -  0.5  0._2 Minutes  -  _3 METs  -  2.59  2.59  4.54  4.54     REL-XR   Level  -  _4 Minutes  -  _5 METs  -  1.8  3.1  4.1  4.7     T5 Nustep   Level  -  _6 Minutes  -  _7 METs  -  2  2._8 Home Exercise Plan   Plans to continue exercise at  -  -  Home (comment) gym in complex  Home (comment) gym in complex  Home (comment) gym in complex   Frequency  -  -  Add 3 additional days  to program exercise sessions.  Add 3  additional days to program exercise sessions.  Add 3 additional days to program exercise sessions.   Initial Home Exercises Provided  -  -  05/12/18  05/12/18  05/12/18      Exercise Comments: Exercise Comments    Row Name 05/03/18 1109 05/12/18 1054         Exercise Comments  First full day of exercise!  Patient was oriented to gym and equipment including functions, settings, policies, and procedures.  Patient's individual exercise prescription and treatment plan were reviewed.  All starting workloads were established based on the results of the 6 minute walk test done at initial orientation visit.  The plan for exercise progression was also introduced and progression will be customized based on patient's performance and goals.   Reviewed home exercise with pt today.  Pt plans to use gym at apartment complex for exercise.  Reviewed THR, pulse, RPE, sign and symptoms, NTG use, and when to call 911 or MD.  Also discussed weather considerations and indoor options.  Pt voiced understanding.         Exercise Goals and Review: Exercise Goals    Row Name 05/02/18 1501             Exercise Goals   Increase Physical Activity  Yes       Intervention  Provide advice, education, support and counseling about physical activity/exercise needs.;Develop an individualized exercise prescription for aerobic and resistive training based on initial evaluation findings, risk stratification, comorbidities and participant's personal goals.       Expected Outcomes  Short Term: Attend rehab on a regular basis to increase amount of physical activity.;Long Term: Add in home exercise to make exercise part of routine and to increase amount of physical activity.;Long Term: Exercising regularly at least 3-5 days a week.       Increase Strength and Stamina  Yes       Intervention  Provide advice, education, support and counseling about physical activity/exercise needs.;Develop an individualized exercise prescription for  aerobic and resistive training based on initial evaluation findings, risk stratification, comorbidities and participant's personal goals.       Expected Outcomes  Short Term: Increase workloads from initial exercise prescription for resistance, speed, and METs.;Short Term: Perform resistance training exercises routinely during rehab and add in resistance training at home;Long Term: Improve cardiorespiratory fitness, muscular endurance and strength as measured by increased METs and functional capacity (6MWT)       Able to understand and use rate of perceived exertion (RPE) scale  Yes       Intervention  Provide education and explanation on how to use RPE scale       Expected Outcomes  Short Term: Able to use RPE daily in rehab to express subjective intensity level;Long Term:  Able to use RPE to guide intensity level when exercising independently       Knowledge and understanding of Target Heart Rate Range (THRR)  Yes       Intervention  Provide education and explanation of THRR including how the numbers were predicted and where they are located for reference       Expected Outcomes  Short Term: Able to state/look up THRR;Short Term: Able to use daily as guideline for intensity in rehab;Long Term: Able to use THRR to govern intensity when exercising independently       Able to check pulse independently  Yes       Intervention  Provide  education and demonstration on how to check pulse in carotid and radial arteries.;Review the importance of being able to check your own pulse for safety during independent exercise       Expected Outcomes  Short Term: Able to explain why pulse checking is important during independent exercise;Long Term: Able to check pulse independently and accurately       Understanding of Exercise Prescription  Yes       Intervention  Provide education, explanation, and written materials on patient's individual exercise prescription       Expected Outcomes  Short Term: Able to explain  program exercise prescription;Long Term: Able to explain home exercise prescription to exercise independently          Exercise Goals Re-Evaluation : Exercise Goals Re-Evaluation    Row Name 05/03/18 1109 05/10/18 1559 05/12/18 1054 05/23/18 1610 06/07/18 1538     Exercise Goal Re-Evaluation   Exercise Goals Review  Increase Physical Activity;Increase Strength and Stamina;Able to understand and use rate of perceived exertion (RPE) scale;Knowledge and understanding of Target Heart Rate Range (THRR);Understanding of Exercise Prescription  Increase Physical Activity;Increase Strength and Stamina;Understanding of Exercise Prescription  Increase Physical Activity;Increase Strength and Stamina;Able to understand and use rate of perceived exertion (RPE) scale;Knowledge and understanding of Target Heart Rate Range (THRR);Able to check pulse independently  Increase Physical Activity;Increase Strength and Stamina;Understanding of Exercise Prescription  Increase Physical Activity;Increase Strength and Stamina;Understanding of Exercise Prescription   Comments  Reviewed RPE scale, THR and program prescription with pt today.  Pt voiced understanding and was given a copy of goals to take home.   David Mccullough is off to a good start in rehab.  He has completed his first three full days of exercise.  He is now up to level 4 on the NuStep. We will continue to moniotr his progression.    Reviewed home exercise with pt today.  Pt plans to use gym at apartment complex for exercise.  Reviewed THR, pulse, RPE, sign and symptoms, NTG use, and when to call 911 or MD.  Also discussed weather considerations and indoor options.  Pt voiced understanding.  David Mccullough is doing well in rehab.  He is now up to 3.1 METs on the XR.  We will continue to monitor his workloads.   David Mccullough continues to do well in rehab.  He is up to using 5 lbs handweights.  His PHQ improved today.  We will continue to try to increase his METs on the NuStep.  We will  continue to monitor his progress.    Expected Outcomes  Short: Use RPE daily to regulate intensity. Long: Follow program prescription in THR.  Short: Increase workloads and review home exercise guidelines.  Long: Continue to follow program prescription.   Short - increase walking time to 30 min from 15 - practice taking HR Long - maintain exercise on his own  Short: Increase T5 NuStep.  Long: Continue to increase strength and stamina.   Short: Increase T5 NuStep.  Long: Continue to increase physcial activity!!   Row Name 06/20/18 1634             Exercise Goal Re-Evaluation   Comments  David Mccullough has been doing well in rehab.  He is up to 4.7 METs on the NuStep.  We will continue to monitor his progress.        Expected Outcomes  Short: Continue to increase METs.  Long: Continue to exercise more at home.  Discharge Exercise Prescription (Final Exercise Prescription Changes): Exercise Prescription Changes - 06/20/18 1600      Response to Exercise   Blood Pressure (Admit)  104/68    Blood Pressure (Exercise)  148/80    Blood Pressure (Exit)  104/60    Heart Rate (Admit)  76 bpm    Heart Rate (Exercise)  111 bpm    Heart Rate (Exit)  77 bpm    Rating of Perceived Exertion (Exercise)  12    Symptoms  none    Duration  Continue with 30 min of aerobic exercise without signs/symptoms of physical distress.    Intensity  THRR unchanged      Progression   Progression  Continue to progress workloads to maintain intensity without signs/symptoms of physical distress.    Average METs  3.74      Resistance Training   Training Prescription  Yes    Weight  5 lbs    Reps  10-15      Interval Training   Interval Training  No      Treadmill   MPH  3    Grade  3    Minutes  15    METs  4.54      REL-XR   Level  5    Minutes  15    METs  4.7      T5 Nustep   Level  4    Minutes  15    METs  2      Home Exercise Plan   Plans to continue exercise at  Home (comment)   gym in  complex   Frequency  Add 3 additional days to program exercise sessions.    Initial Home Exercises Provided  05/12/18       Nutrition:  Target Goals: Understanding of nutrition guidelines, daily intake of sodium <1534m, cholesterol <2072m calories 30% from fat and 7% or less from saturated fats, daily to have 5 or more servings of fruits and vegetables.  Biometrics: Pre Biometrics - 05/02/18 1502      Pre Biometrics   Height  5' 11.1" (1.806 m)    Weight  202 lb 8 oz (91.9 kg)    Waist Circumference  40 inches    Hip Circumference  38 inches    Waist to Hip Ratio  1.05 %    BMI (Calculated)  28.16    Single Leg Stand  8.07 seconds      Post Biometrics - 06/23/18 1228       Post  Biometrics   Height  5' 11.1" (1.806 m)    Weight  205 lb (93 kg)    Waist Circumference  41 inches    Hip Circumference  41 inches    Waist to Hip Ratio  1 %    BMI (Calculated)  28.51    Single Leg Stand  9.02 seconds       Nutrition Therapy Plan and Nutrition Goals: Nutrition Therapy & Goals - 05/03/18 1108      Nutrition Therapy   Diet  TLC    Drug/Food Interactions  Statins/Certain Fruits    Protein (specify units)  9oz    Fiber  30 grams    Whole Grain Foods  3 servings   chooses whole grain bread   Saturated Fats  15 max. grams    Fruits and Vegetables  5 servings/day   8 ideal; eats more vegetables than fruits   Sodium  1500 grams  Personal Nutrition Goals   Nutrition Goal  Include at least one additional serving of fish per week    Personal Goal #2  Try breakfast options that are higher in fiber and lower in saturated/trans fats. For example, swap your sausage + egg biscuit for canadian bacon + egg english muffin or whole grain bread sandwich or choosing oatmeal instead.     Comments  He feels that there are some areas of his diet that he and his wife couple improve, particularly with the types of fats and proteins they eat. His usual breakfast is cereal or a sausage and egg  biscuit sandwich. For lunch he eats sandwiches on wheat bread or salads. For dinners they eat at home 3 days/week and eat out 4 days/week typically. At home his wife cooks chicken or occasionally steak with vegetables / salad and sometimes starches like baked idaho/sweet potatoes or pasta. If eating out they try to choose restaurants rather than fast food; recently [place] and [employer]. He does not add salt to meals post-preparation. Beverages: water, juice, diet soda, non-fat milk. They do not typically buy canned foods or pre-packaged meals. Snack foods include popcorn, cheese crackers, or yogurt.      Intervention Plan   Intervention  Prescribe, educate and counsel regarding individualized specific dietary modifications aiming towards targeted core components such as weight, hypertension, lipid management, diabetes, heart failure and other comorbidities.;Nutrition handout(s) given to patient.   General heart healthy eating powerpoint packet   Expected Outcomes  Short Term Goal: A plan has been developed with personal nutrition goals set during dietitian appointment.;Long Term Goal: Adherence to prescribed nutrition plan.;Short Term Goal: Understand basic principles of dietary content, such as calories, fat, sodium, cholesterol and nutrients.       Nutrition Assessments: Nutrition Assessments - 06/23/18 1229      MEDFICTS Scores   Pre Score  28    Post Score  --   declined post nutrition assessment      Nutrition Goals Re-Evaluation: Nutrition Goals Re-Evaluation    [place] Name 05/19/18 1007 06/02/18 1054           Goals   Current Weight  -  201 lb (91.2 kg)      Nutrition Goal  Include at least one additional serving of fish per week; Try breakfast options that are higher in fiber and lower in saturated / trans fats.  Keep eating one serving of fish per week.      Comment  He and his wife have been buying more fish to eat but he has not yet tried different breakfast options. They have not  been going out to eat much but try to make healthier choices such as salads when they do  He and his wife are trying to eating better. David Mccullough said it is "hard to eat healthy but we are trying to eat more salads.".      Expected Outcome  They will maintain increased intake of fish as opposed to less lean protein sources. Long term goal: trial breakfast options that are lower in saturated and trans fats  Still trying to increase fish intake.        Personal Goal #2 Re-Evaluation   Personal Goal #2  -  Shlok has eaten oatmeal as a healthier option.         Nutrition Goals Discharge (Final Nutrition Goals Re-Evaluation): Nutrition Goals Re-Evaluation - 06/02/18 1054      Goals   Current Weight  201 lb (91.2 kg)  Nutrition Goal  Keep eating one serving of fish per week.    Comment  He and his wife are trying to eating better. Worley said it is "hard to eat healthy but we are trying to eat more salads.".    Expected Outcome  Still trying to increase fish intake.      Personal Goal #2 Re-Evaluation   Personal Goal #2  Jian has eaten oatmeal as a healthier option.       Psychosocial: Target Goals: Acknowledge presence or absence of significant depression and/or stress, maximize coping skills, provide positive support system. Participant is able to verbalize types and ability to use techniques and skills needed for reducing stress and depression.   Initial Review & Psychosocial Screening: Initial Psych Review & Screening - 05/02/18 1342      Initial Review   Current issues with  Current Stress Concerns;History of Depression    Source of Stress Concerns  Family    Comments  David Mccullough son struggles with Bipolar. He is currently homeless, in and out of the hospital and on and off his meds. David Mccullough and his wife reported this is a major stressful for their family. David Mccullough expressed concern for her husband that before they moved from [facility] David Mccullough was seeing a psychiatrist. His  psychiatrist had taken him off his depression medications because he was doing so well. They both are concerned he may need to go back on them since his heart surgery and recent events with his son. They were encouraged to reach out to his past psychiatrist to either see him or be referred to someone local.       [place]?  Yes   wife, friends     Barriers   Psychosocial barriers to participate in program  The patient should benefit from training in stress management and relaxation.      Screening Interventions   Interventions  Encouraged to exercise;Program counselor consult;To provide support and resources with identified psychosocial needs;Provide feedback about the scores to participant    Expected Outcomes  Short Term goal: Utilizing psychosocial counselor, staff and physician to assist with identification of specific Stressors or current issues interfering with healing process. Setting desired goal for each stressor or current issue identified.;Long Term Goal: Stressors or current issues are controlled or eliminated.;Short Term goal: Identification and review with participant of any Quality of Life or Depression concerns found by scoring the questionnaire.;Long Term goal: The participant improves quality of Life and PHQ9 Scores as seen by post scores and/or verbalization of changes       Quality of Life Scores:  Quality of Life - 06/23/18 1229      Quality of Life   Select  Quality of Life      Quality of Life Scores   Health/Function Pre  15.85 %    Health/Function Post  25.43 %    Health/Function % Change  60.44 %    Socioeconomic Pre  24 %    Socioeconomic Post  24.43 %    Socioeconomic % Change   1.79 %    Psych/Spiritual Pre  17.07 %    Psych/Spiritual Post  24.86 %    Psych/Spiritual % Change  45.64 %    Family Pre  16.8 %    Family Post  24 %    Family % Change  42.86 %    GLOBAL Pre  17.85 %    GLOBAL Post  24.88 %  GLOBAL % Change   39.38 %      Scores of 19 and below usually indicate a poorer quality of life in these areas.  A difference of  2-3 points is a clinically meaningful difference.  A difference of 2-3 points in the total score of the Quality of Life Index has been associated with significant improvement in overall quality of life, self-image, physical symptoms, and general health in studies assessing change in quality of life.  PHQ-9: Recent Review Flowsheet Data    Depression screen [facility] 2/9 06/23/2018 06/07/2018 05/02/2018 04/14/2018   Decreased Interest 0 1 1 0   Down, Depressed, Hopeless _0 0   PHQ - 2 Score _1 0   Altered sleeping 0 0 0 -   Tired, decreased energy _2 -   Change in appetite 0 0 1 -   Feeling bad or failure about yourself  0 0 0 -   Trouble concentrating 0 0 0 -   Moving slowly or fidgety/restless 0 1 0 -   Suicidal thoughts 0 0 1  -   PHQ-9 Score _3 -   Difficult doing work/chores Not difficult at all Somewhat difficult Somewhat difficult -     Interpretation of Total Score  Total Score Depression Severity:  1-4 = Minimal depression, 5-9 = Mild depression, 10-14 = Moderate depression,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

## 2018-06-28 NOTE — Progress Notes (Signed)
Daily Session Note  Patient Details  Name: David Mccullough MRN: [id number] Date of Birth: [birth year] Referring Provider:     Cardiac Rehab from 05/02/2018 in [facility] Cardiac and Pulmonary Rehab  Referring Provider  [name] MD [Local Cardiologist: [name], MD]      Encounter Date: 06/28/2018  Check In: Session Check In - 06/28/18 1125      Check-In   Supervising physician immediately available to respond to emergencies  See telemetry face sheet for immediately available ER MD    Location  [facility]-Cardiac & Pulmonary Rehab    Staff Present  Gerlene Burdock, RN, BSN;Jeanna Durrell BS, Exercise Physiologist;Amanda Oletta Darter, BA, ACSM CEP, Exercise Physiologist    Medication changes reported      No    Fall or balance concerns reported     No    Tobacco Cessation  No Change    Warm-up and Cool-down  Performed as group-led instruction    Resistance Training Performed  Yes    VAD Patient?  No    PAD/SET Patient?  No      Pain Assessment   Currently in Pain?  No/denies    Multiple Pain Sites  No          Social History   Tobacco Use  Smoking Status Former Smoker  . Packs/day: 1.00  . Years: 25.00  . Pack years: 25.00  . Types: Cigarettes  . Last attempt to quit: 06/30/1987  . Years since quitting: 31.0  Smokeless Tobacco Never Used    Goals Met:  Independence with exercise equipment Exercise tolerated well No report of cardiac concerns or symptoms Strength training completed today  Goals Unmet:  Not Applicable  Comments: Pt able to follow exercise prescription today without complaint.  Will continue to monitor for progression.    Dr. Emily Filbert is Medical Director for [place] and [name].

## 2018-06-30 ENCOUNTER — Encounter: Payer: Medicare Other | Attending: Internal Medicine

## 2018-06-30 DIAGNOSIS — Z7902 Long term (current) use of antithrombotics/antiplatelets: Secondary | ICD-10-CM | POA: Diagnosis not present

## 2018-06-30 DIAGNOSIS — Z87891 Personal history of nicotine dependence: Secondary | ICD-10-CM | POA: Diagnosis not present

## 2018-06-30 DIAGNOSIS — I2510 Atherosclerotic heart disease of native coronary artery without angina pectoris: Secondary | ICD-10-CM | POA: Insufficient documentation

## 2018-06-30 DIAGNOSIS — I252 Old myocardial infarction: Secondary | ICD-10-CM | POA: Diagnosis not present

## 2018-06-30 DIAGNOSIS — F329 Major depressive disorder, single episode, unspecified: Secondary | ICD-10-CM | POA: Diagnosis not present

## 2018-06-30 DIAGNOSIS — Z79899 Other long term (current) drug therapy: Secondary | ICD-10-CM | POA: Diagnosis not present

## 2018-06-30 DIAGNOSIS — Z7982 Long term (current) use of aspirin: Secondary | ICD-10-CM | POA: Insufficient documentation

## 2018-06-30 DIAGNOSIS — I4819 Other persistent atrial fibrillation: Secondary | ICD-10-CM | POA: Insufficient documentation

## 2018-06-30 DIAGNOSIS — Z951 Presence of aortocoronary bypass graft: Secondary | ICD-10-CM | POA: Insufficient documentation

## 2018-06-30 NOTE — Progress Notes (Signed)
Daily Session Note  Patient Details  Name: David Mccullough MRN: [id number] Date of Birth: 14-Jun-1946 Referring Provider:     Cardiac Rehab from 05/02/2018 in [facility] Cardiac and Pulmonary Rehab  Referring Provider  [name] MD [Local Cardiologist: [name], MD]      Encounter Date: 06/30/2018  Check In: Session Check In - 06/30/18 0929      Check-In   Supervising physician immediately available to respond to emergencies  See telemetry face sheet for immediately available ER MD    Location  [facility]-Cardiac & Pulmonary Rehab    Staff Present  Justin Mend RCP,RRT,BSRT;Carroll Enterkin, RN, BSN;Jeanna Durrell BS, Exercise Physiologist    Medication changes reported      No    Fall or balance concerns reported     No    Warm-up and Cool-down  Performed as group-led instruction    Resistance Training Performed  Yes    VAD Patient?  No    PAD/SET Patient?  No      Pain Assessment   Currently in Pain?  No/denies          Social History   Tobacco Use  Smoking Status Former Smoker  . Packs/day: 1.00  . Years: 25.00  . Pack years: 25.00  . Types: Cigarettes  . Last attempt to quit: 06/30/1987  . Years since quitting: 31.0  Smokeless Tobacco Never Used    Goals Met:  Independence with exercise equipment Exercise tolerated well No report of cardiac concerns or symptoms Strength training completed today  Goals Unmet:  Not Applicable  Comments: Pt able to follow exercise prescription today without complaint.  Will continue to monitor for progression.    Dr. Emily Filbert is Medical Director for [place] and [name].

## 2018-07-05 DIAGNOSIS — Z79899 Other long term (current) drug therapy: Secondary | ICD-10-CM | POA: Diagnosis not present

## 2018-07-05 DIAGNOSIS — Z951 Presence of aortocoronary bypass graft: Secondary | ICD-10-CM

## 2018-07-05 DIAGNOSIS — Z7902 Long term (current) use of antithrombotics/antiplatelets: Secondary | ICD-10-CM | POA: Diagnosis not present

## 2018-07-05 DIAGNOSIS — Z7982 Long term (current) use of aspirin: Secondary | ICD-10-CM | POA: Diagnosis not present

## 2018-07-05 DIAGNOSIS — I2510 Atherosclerotic heart disease of native coronary artery without angina pectoris: Secondary | ICD-10-CM | POA: Diagnosis not present

## 2018-07-05 DIAGNOSIS — I252 Old myocardial infarction: Secondary | ICD-10-CM | POA: Diagnosis not present

## 2018-07-05 NOTE — Patient Instructions (Signed)
Discharge Patient Instructions  Patient Details  Name: David Mccullough MRN: [id number] Date of Birth: January 11, 1946 Referring Provider:  Jolaine Artist, MD   Number of Visits: 44  Reason for Discharge:  Patient reached a stable level of exercise. Patient independent in their exercise. Patient has met program and personal goals.  Smoking History:  Social History   Tobacco Use  Smoking Status Former Smoker  . Packs/day: 1.00  . Years: 25.00  . Pack years: 25.00  . Types: Cigarettes  . Last attempt to quit: 06/30/1987  . Years since quitting: 31.0  Smokeless Tobacco Never Used    Diagnosis:  S/P CABG x 3  Initial Exercise Prescription: Initial Exercise Prescription - 05/02/18 1400      Date of Initial Exercise RX and Referring Provider   Date  05/02/18    Referring Provider  [name] MD   Local Cardiologist: [name], MD     Treadmill   MPH  1.9    Grade  0.5    Minutes  15    METs  2.59      REL-XR   Level  1    Speed  50    Minutes  15    METs  2.5      T5 Nustep   Level  1    SPM  80    Minutes  15    METs  2.5      Prescription Details   Frequency (times per week)  2    Duration  Progress to 30 minutes of continuous aerobic without signs/symptoms of physical distress      Intensity   THRR 40-80% of Max Heartrate  104-133    Ratings of Perceived Exertion  11-13    Perceived Dyspnea  0-4      Progression   Progression  Continue to progress workloads to maintain intensity without signs/symptoms of physical distress.      Resistance Training   Training Prescription  Yes    Weight  4 lbs    Reps  10-15       Discharge Exercise Prescription (Final Exercise Prescription Changes): Exercise Prescription Changes - 06/20/18 1600      Response to Exercise   Blood Pressure (Admit)  104/68    Blood Pressure (Exercise)  148/80    Blood Pressure (Exit)  104/60    Heart Rate (Admit)  76 bpm    Heart Rate (Exercise)  111 bpm    Heart  Rate (Exit)  77 bpm    Rating of Perceived Exertion (Exercise)  12    Symptoms  none    Duration  Continue with 30 min of aerobic exercise without signs/symptoms of physical distress.    Intensity  THRR unchanged      Progression   Progression  Continue to progress workloads to maintain intensity without signs/symptoms of physical distress.    Average METs  3.74      Resistance Training   Training Prescription  Yes    Weight  5 lbs    Reps  10-15      Interval Training   Interval Training  No      Treadmill   MPH  3    Grade  3    Minutes  15    METs  4.54      REL-XR   Level  5    Minutes  15    METs  4.7      T5 Nustep  Level  4    Minutes  15    METs  2      Home Exercise Plan   Plans to continue exercise at  Home (comment)   gym in complex   Frequency  Add 3 additional days to program exercise sessions.    Initial Home Exercises Provided  05/12/18       Functional Capacity: 6 Minute Walk    Row Name 05/02/18 1456 06/23/18 1228       6 Minute Walk   Phase  Initial  Discharge    Distance  1264 feet  1540 feet    Distance % Change  -  21.8 %    Distance Feet Change  -  276 ft    Walk Time  6 minutes  6 minutes    MPH  2.39  2.92    METS  2.59  3.34    RPE  11  11    VO2 Peak  9.06  11.7    Symptoms  No  No    Resting HR  76 bpm  94 bpm    Resting BP  136/66  126/60    Resting Oxygen Saturation   100 %  -    Exercise Oxygen Saturation  during 6 min walk  98 %  -    Max Ex. HR  81 bpm  117 bpm    Max Ex. BP  144/70  134/62    2 Minute Post BP  136/62  -       Quality of Life: Quality of Life - 06/23/18 1229      Quality of Life   Select  Quality of Life      Quality of Life Scores   Health/Function Pre  15.85 %    Health/Function Post  25.43 %    Health/Function % Change  60.44 %    Socioeconomic Pre  24 %    Socioeconomic Post  24.43 %    Socioeconomic % Change   1.79 %    Psych/Spiritual Pre  17.07 %    Psych/Spiritual Post  24.86 %     Psych/Spiritual % Change  45.64 %    Family Pre  16.8 %    Family Post  24 %    Family % Change  42.86 %    GLOBAL Pre  17.85 %    GLOBAL Post  24.88 %    GLOBAL % Change  39.38 %       Personal Goals: Goals established at orientation with interventions provided to work toward goal. Personal Goals and Risk Factors at Admission - 05/02/18 1338      Core Components/Risk Factors/Patient Goals on Admission    Weight Management  Yes;Weight Loss    Intervention  Weight Management: Develop a combined nutrition and exercise program designed to reach desired caloric intake, while maintaining appropriate intake of nutrient and fiber, sodium and fats, and appropriate energy expenditure required for the weight goal.;Weight Management: Provide education and appropriate resources to help participant work on and attain dietary goals.;Weight Management/Obesity: Establish reasonable short term and long term weight goals.    Admit Weight  202 lb (91.6 kg)    Goal Weight: Short Term  198 lb (89.8 kg)    Goal Weight: Long Term  195 lb (88.5 kg)    Expected Outcomes  Short Term: Continue to assess and modify interventions until short term weight is achieved;Long Term: Adherence to nutrition and physical activity/exercise program  aimed toward attainment of established weight goal;Weight Maintenance: Understanding of the daily nutrition guidelines, which includes 25-35% calories from fat, 7% or less cal from saturated fats, less than 248m cholesterol, less than 1.5gm of sodium, & 5 or more servings of fruits and vegetables daily;Weight Loss: Understanding of general recommendations for a balanced deficit meal plan, which promotes 1-2 lb weight loss per week and includes a negative energy balance of [phone] kcal/d;Understanding recommendations for meals to include 15-35% energy as protein, 25-35% energy from fat, 35-60% energy from carbohydrates, less than 2016mof dietary cholesterol, 20-35 gm of total fiber  daily;Understanding of distribution of calorie intake throughout the day with the consumption of 4-5 meals/snacks    Lipids  Yes    Intervention  Provide education and support for participant on nutrition & aerobic/resistive exercise along with prescribed medications to achieve LDL <704mHDL >60m4m  Expected Outcomes  Short Term: Participant states understanding of desired cholesterol values and is compliant with medications prescribed. Participant is following exercise prescription and nutrition guidelines.;Long Term: Cholesterol controlled with medications as prescribed, with individualized exercise RX and with personalized nutrition plan. Value goals: LDL < 70mg60mL > 40 mg.        Personal Goals Discharge: Goals and Risk Factor Review - 06/02/18 1125      Core Components/Risk Factors/Patient Goals Review   Personal Goals Review  Weight Management/Obesity    Review  David Mccullough weighed 208lbs today. David Mccullough taking his medicines as directed. [place] Mccullough that it wasn't such bad weather outside so [facility] in the am for people to walk.     Expected Outcomes  Cont to exericse here in Cardiac Rehab and possibly at the [place] he [place] in [place]    Exercise Goals and Review: Exercise Goals    Row Name 05/02/18 1501             Exercise Goals   Increase Physical Activity  Yes       Intervention  Provide advice, education, support and counseling about physical activity/exercise needs.;Develop an individualized exercise prescription for aerobic and resistive training based on initial evaluation findings, risk stratification, comorbidities and participant's personal goals.       Expected Outcomes  Short Term: Attend rehab on a regular basis to increase amount of physical activity.;Long Term: Add in home exercise to make exercise part of routine and to  increase amount of physical activity.;Long Term: Exercising regularly at least 3-5 days a week.       Increase Strength and Stamina  Yes       Intervention  Provide advice, education, support and counseling about physical activity/exercise needs.;Develop an individualized exercise prescription for aerobic and resistive training based on initial evaluation findings, risk stratification, comorbidities and participant's personal goals.       Expected Outcomes  Short Term: Increase workloads from initial exercise prescription for resistance, speed, and METs.;Short Term: Perform resistance training exercises routinely during rehab and add in resistance training at home;Long Term: Improve cardiorespiratory fitness, muscular endurance and strength as measured by increased METs and functional capacity (6MWT)       Able to understand and use rate of perceived exertion (RPE) scale  Yes       Intervention  Provide education and explanation on how to use RPE scale  Expected Outcomes  Short Term: Able to use RPE daily in rehab to express subjective intensity level;Long Term:  Able to use RPE to guide intensity level when exercising independently       Knowledge and understanding of Target Heart Rate Range (THRR)  Yes       Intervention  Provide education and explanation of THRR including how the numbers were predicted and where they are located for reference       Expected Outcomes  Short Term: Able to state/look up THRR;Short Term: Able to use daily as guideline for intensity in rehab;Long Term: Able to use THRR to govern intensity when exercising independently       Able to check pulse independently  Yes       Intervention  Provide education and demonstration on how to check pulse in carotid and radial arteries.;Review the importance of being able to check your own pulse for safety during independent exercise       Expected Outcomes  Short Term: Able to explain why pulse checking is important during  independent exercise;Long Term: Able to check pulse independently and accurately       Understanding of Exercise Prescription  Yes       Intervention  Provide education, explanation, and written materials on patient's individual exercise prescription       Expected Outcomes  Short Term: Able to explain program exercise prescription;Long Term: Able to explain home exercise prescription to exercise independently          Exercise Goals Re-Evaluation: Exercise Goals Re-Evaluation    Row Name 05/03/18 1109 05/10/18 1559 05/12/18 1054 05/23/18 1610 06/07/18 1538     Exercise Goal Re-Evaluation   Exercise Goals Review  Increase Physical Activity;Increase Strength and Stamina;Able to understand and use rate of perceived exertion (RPE) scale;Knowledge and understanding of Target Heart Rate Range (THRR);Understanding of Exercise Prescription  Increase Physical Activity;Increase Strength and Stamina;Understanding of Exercise Prescription  Increase Physical Activity;Increase Strength and Stamina;Able to understand and use rate of perceived exertion (RPE) scale;Knowledge and understanding of Target Heart Rate Range (THRR);Able to check pulse independently  Increase Physical Activity;Increase Strength and Stamina;Understanding of Exercise Prescription  Increase Physical Activity;Increase Strength and Stamina;Understanding of Exercise Prescription   Comments  Reviewed RPE scale, THR and program prescription with pt today.  Pt voiced understanding and was given a copy of goals to take home.   David Mccullough is off to a good start in rehab.  He has completed his first three full days of exercise.  He is now up to level 4 on the NuStep. We will continue to moniotr his progression.    Reviewed home exercise with pt today.  Pt plans to use gym at apartment complex for exercise.  Reviewed THR, pulse, RPE, sign and symptoms, NTG use, and when to call 911 or MD.  Also discussed weather considerations and indoor options.  Pt voiced  understanding.  David Mccullough is doing well in rehab.  He is now up to 3.1 METs on the XR.  We will continue to monitor his workloads.   David Mccullough continues to do well in rehab.  He is up to using 5 lbs handweights.  His PHQ improved today.  We will continue to try to increase his METs on the NuStep.  We will continue to monitor his progress.    Expected Outcomes  Short: Use RPE daily to regulate intensity. Long: Follow program prescription in THR.  Short: Increase workloads and review home exercise guidelines.  Long: Continue to  follow program prescription.   Short - increase walking time to 30 min from 15 - practice taking HR Long - maintain exercise on his own  Short: Increase T5 NuStep.  Long: Continue to increase strength and stamina.   Short: Increase T5 NuStep.  Long: Continue to increase physcial activity!!   Row Name 06/20/18 1634             Exercise Goal Re-Evaluation   Comments  David Mccullough has been doing well in rehab.  He is up to 4.7 METs on the NuStep.  We will continue to monitor his progress.        Expected Outcomes  Short: Continue to increase METs.  Long: Continue to exercise more at home.           Nutrition & Weight - Outcomes: Pre Biometrics - 05/02/18 1502      Pre Biometrics   Height  5' 11.1" (1.806 m)    Weight  202 lb 8 oz (91.9 kg)    Waist Circumference  40 inches    Hip Circumference  38 inches    Waist to Hip Ratio  1.05 %    BMI (Calculated)  28.16    Single Leg Stand  8.07 seconds      Post Biometrics - 06/23/18 1228       Post  Biometrics   Height  5' 11.1" (1.806 m)    Weight  205 lb (93 kg)    Waist Circumference  41 inches    Hip Circumference  41 inches    Waist to Hip Ratio  1 %    BMI (Calculated)  28.51    Single Leg Stand  9.02 seconds       Nutrition: Nutrition Therapy & Goals - 05/03/18 1108      Nutrition Therapy   Diet  TLC    Drug/Food Interactions  Statins/Certain Fruits    Protein (specify units)  9oz    Fiber  30 grams    Whole  Grain Foods  3 servings   chooses whole grain bread   Saturated Fats  15 max. grams    Fruits and Vegetables  5 servings/day   8 ideal; eats more vegetables than fruits   Sodium  1500 grams      Personal Nutrition Goals   Nutrition Goal  Include at least one additional serving of fish per week    Personal Goal #2  Try breakfast options that are higher in fiber and lower in saturated/trans fats. For example, swap your sausage + egg biscuit for canadian bacon + egg english muffin or whole grain bread sandwich or choosing oatmeal instead.     Comments  He feels that there are some areas of his diet that he and his wife couple improve, particularly with the types of fats and proteins they eat. His usual breakfast is cereal or a sausage and egg biscuit sandwich. For lunch he eats sandwiches on wheat bread or salads. For dinners they eat at home 3 days/week and eat out 4 days/week typically. At home his wife cooks chicken or occasionally steak with vegetables / salad and sometimes starches like baked idaho/sweet potatoes or pasta. If eating out they try to choose restaurants rather than fast food; recently [place] and [employer]. He does not add salt to meals post-preparation. Beverages: water, juice, diet soda, non-fat milk. They do not typically buy canned foods or pre-packaged meals. Snack foods include popcorn, cheese crackers, or yogurt.  Intervention Plan   Intervention  Prescribe, educate and counsel regarding individualized specific dietary modifications aiming towards targeted core components such as weight, hypertension, lipid management, diabetes, heart failure and other comorbidities.;Nutrition handout(s) given to patient.   General heart healthy eating powerpoint packet   Expected Outcomes  Short Term Goal: A plan has been developed with personal nutrition goals set during dietitian appointment.;Long Term Goal: Adherence to prescribed nutrition plan.;Short Term Goal: Understand basic principles  of dietary content, such as calories, fat, sodium, cholesterol and nutrients.       Nutrition Discharge: Nutrition Assessments - 06/23/18 1229      MEDFICTS Scores   Pre Score  28    Post Score  --   declined post nutrition assessment      Education Questionnaire Score: Knowledge Questionnaire Score - 06/23/18 1229      Knowledge Questionnaire Score   Pre Score  22/26    Post Score  24/26       Goals reviewed with patient; copy given to patient.

## 2018-07-05 NOTE — Progress Notes (Signed)
Daily Session Note  Patient Details  Name: David Mccullough MRN: [id number] Date of Birth: 06-Oct-1945 Referring Provider:     Cardiac Rehab from 05/02/2018 in [facility]  Referring Provider  [name] MD [Local Cardiologist: [name], MD]      Encounter Date: 07/05/2018  Check In: Session Check In - 07/05/18 0916      Check-In   Supervising physician immediately available to respond to emergencies  See telemetry face sheet for immediately available ER MD    Location  [facility]    Staff Present  Heath Lark, RN, BSN, CCRP;Hannelore Bova BS, Exercise Physiologist;Jessica Zalma, MA, RCEP, CCRP, Exercise Physiologist    Medication changes reported      No    Fall or balance concerns reported     No    Tobacco Cessation  No Change    Warm-up and Cool-down  Performed as group-led instruction    Resistance Training Performed  Yes    VAD Patient?  No    PAD/SET Patient?  No      Pain Assessment   Currently in Pain?  No/denies          Social History   Tobacco Use  Smoking Status Former Smoker  . Packs/day: 1.00  . Years: 25.00  . Pack years: 25.00  . Types: Cigarettes  . Last attempt to quit: 06/30/1987  . Years since quitting: 31.0  Smokeless Tobacco Never Used    Goals Met:  Independence with exercise equipment Exercise tolerated well No report of cardiac concerns or symptoms Strength training completed today  Goals Unmet:  Not Applicable  Comments: Pt able to follow exercise prescription today without complaint.  Will continue to monitor for progression.    Dr. Emily Filbert is Medical Director for [place] [name].

## 2018-07-07 ENCOUNTER — Encounter: Payer: Medicare Other | Admitting: *Deleted

## 2018-07-07 DIAGNOSIS — I252 Old myocardial infarction: Secondary | ICD-10-CM | POA: Diagnosis not present

## 2018-07-07 DIAGNOSIS — Z7982 Long term (current) use of aspirin: Secondary | ICD-10-CM | POA: Diagnosis not present

## 2018-07-07 DIAGNOSIS — I2510 Atherosclerotic heart disease of native coronary artery without angina pectoris: Secondary | ICD-10-CM | POA: Diagnosis not present

## 2018-07-07 DIAGNOSIS — Z7902 Long term (current) use of antithrombotics/antiplatelets: Secondary | ICD-10-CM | POA: Diagnosis not present

## 2018-07-07 DIAGNOSIS — Z951 Presence of aortocoronary bypass graft: Secondary | ICD-10-CM | POA: Diagnosis not present

## 2018-07-07 DIAGNOSIS — Z79899 Other long term (current) drug therapy: Secondary | ICD-10-CM | POA: Diagnosis not present

## 2018-07-07 NOTE — Progress Notes (Signed)
Discharge Progress Report  Patient Details  Name: David Mccullough MRN: [id number] Date of Birth: 1946-06-27 Referring Provider:     Cardiac Rehab from 05/02/2018 in [facility]  Referring Provider  David Bickers MD [Local Cardiologist: [name], MD]       Number of Visits: 19  Reason for Discharge:  Patient reached a stable level of exercise. Patient independent in their exercise. Patient has met program and personal goals.  Smoking History:  Social History   Tobacco Use  Smoking Status Former Smoker  . Packs/day: 1.00  . Years: 25.00  . Pack years: 25.00  . Types: Cigarettes  . Last attempt to quit: 06/30/1987  . Years since quitting: 31.0  Smokeless Tobacco Never Used    Diagnosis:  S/P CABG x 3  ADL UCSD:   Initial Exercise Prescription: Initial Exercise Prescription - 05/02/18 1400      Date of Initial Exercise RX and Referring Provider   Date  05/02/18    Referring Provider  David Bickers MD   Local Cardiologist: [name], MD     Treadmill   MPH  1.9    Grade  0.5    Minutes  15    METs  2.59      REL-XR   Level  1    Speed  50    Minutes  15    METs  2.5      T5 Nustep   Level  1    SPM  80    Minutes  15    METs  2.5      Prescription Details   Frequency (times per week)  2    Duration  Progress to 30 minutes of continuous aerobic without signs/symptoms of physical distress      Intensity   THRR 40-80% of Max Heartrate  104-133    Ratings of Perceived Exertion  11-13    Perceived Dyspnea  0-4      Progression   Progression  Continue to progress workloads to maintain intensity without signs/symptoms of physical distress.      Resistance Training   Training Prescription  Yes    Weight  4 lbs    Reps  10-15       Discharge Exercise Prescription (Final Exercise Prescription Changes): Exercise Prescription Changes - 06/20/18 1600      Response to Exercise   Blood Pressure (Admit)  104/68     Blood Pressure (Exercise)  148/80    Blood Pressure (Exit)  104/60    Heart Rate (Admit)  76 bpm    Heart Rate (Exercise)  111 bpm    Heart Rate (Exit)  77 bpm    Rating of Perceived Exertion (Exercise)  12    Symptoms  none    Duration  Continue with 30 min of aerobic exercise without signs/symptoms of physical distress.    Intensity  THRR unchanged      Progression   Progression  Continue to progress workloads to maintain intensity without signs/symptoms of physical distress.    Average METs  3.74      Resistance Training   Training Prescription  Yes    Weight  5 lbs    Reps  10-15      Interval Training   Interval Training  No      Treadmill   MPH  3    Grade  3    Minutes  15    METs  4.54  REL-XR   Level  5    Minutes  15    METs  4.7      T5 Nustep   Level  4    Minutes  15    METs  2      Home Exercise Plan   Plans to continue exercise at  Home (comment)   gym in complex   Frequency  Add 3 additional days to program exercise sessions.    Initial Home Exercises Provided  05/12/18       Functional Capacity: 6 Minute Walk    Row Name 05/02/18 1456 06/23/18 1228       6 Minute Walk   Phase  Initial  Discharge    Distance  1264 feet  1540 feet    Distance % Change  -  21.8 %    Distance Feet Change  -  276 ft    Walk Time  6 minutes  6 minutes    MPH  2.39  2.92    METS  2.59  3.34    RPE  11  11    VO2 Peak  9.06  11.7    Symptoms  No  No    Resting HR  76 bpm  94 bpm    Resting BP  136/66  126/60    Resting Oxygen Saturation   100 %  -    Exercise Oxygen Saturation  during 6 min walk  98 %  -    Max Ex. HR  81 bpm  117 bpm    Max Ex. BP  144/70  134/62    2 Minute Post BP  136/62  -       Psychological, QOL, Others - Outcomes: PHQ 2/9: Depression screen [facility] 2/9 06/23/2018 06/07/2018 05/02/2018 04/14/2018  Decreased Interest 0 1 1 0  Down, Depressed, Hopeless _0 0  PHQ - 2 Score _1 0  Altered sleeping 0 0 0 -  Tired,  decreased energy _2 -  Change in appetite 0 0 1 -  Feeling bad or failure about yourself  0 0 0 -  Trouble concentrating 0 0 0 -  Moving slowly or fidgety/restless 0 1 0 -  Suicidal thoughts 0 0 1 -  PHQ-9 Score _3 -  Difficult doing work/chores Not difficult at all Somewhat difficult Somewhat difficult -    Quality of Life: Quality of Life - 06/23/18 1229      Quality of Life   Select  Quality of Life      Quality of Life Scores   Health/Function Pre  15.85 %    Health/Function Post  25.43 %    Health/Function % Change  60.44 %    Socioeconomic Pre  24 %    Socioeconomic Post  24.43 %    Socioeconomic % Change   1.79 %    Psych/Spiritual Pre  17.07 %    Psych/Spiritual Post  24.86 %    Psych/Spiritual % Change  45.64 %    Family Pre  16.8 %    Family Post  24 %    Family % Change  42.86 %    GLOBAL Pre  17.85 %    GLOBAL Post  24.88 %    GLOBAL % Change  39.38 %       Personal Goals: Goals established at orientation with interventions provided to work toward goal. Personal Goals and Risk Factors at Admission - 05/02/18  1338      Core Components/Risk Factors/Patient Goals on Admission    Weight Management  Yes;Weight Loss    Intervention  Weight Management: Develop a combined nutrition and exercise program designed to reach desired caloric intake, while maintaining appropriate intake of nutrient and fiber, sodium and fats, and appropriate energy expenditure required for the weight goal.;Weight Management: Provide education and appropriate resources to help participant work on and attain dietary goals.;Weight Management/Obesity: Establish reasonable short term and long term weight goals.    Admit Weight  202 lb (91.6 kg)    Goal Weight: Short Term  198 lb (89.8 kg)    Goal Weight: Long Term  195 lb (88.5 kg)    Expected Outcomes  Short Term: Continue to assess and modify interventions until short term weight is achieved;Long Term: Adherence to nutrition and physical  activity/exercise program aimed toward attainment of established weight goal;Weight Maintenance: Understanding of the daily nutrition guidelines, which includes 25-35% calories from fat, 7% or less cal from saturated fats, less than 232m cholesterol, less than 1.5gm of sodium, & 5 or more servings of fruits and vegetables daily;Weight Loss: Understanding of general recommendations for a balanced deficit meal plan, which promotes 1-2 lb weight loss per week and includes a negative energy balance of [phone] kcal/d;Understanding recommendations for meals to include 15-35% energy as protein, 25-35% energy from fat, 35-60% energy from carbohydrates, less than 207mof dietary cholesterol, 20-35 gm of total fiber daily;Understanding of distribution of calorie intake throughout the day with the consumption of 4-5 meals/snacks    Lipids  Yes    Intervention  Provide education and support for participant on nutrition & aerobic/resistive exercise along with prescribed medications to achieve LDL <7067mHDL >99m94m  Expected Outcomes  Short Term: Participant states understanding of desired cholesterol values and is compliant with medications prescribed. Participant is following exercise prescription and nutrition guidelines.;Long Term: Cholesterol controlled with medications as prescribed, with individualized exercise RX and with personalized nutrition plan. Value goals: LDL < 70mg45mL > 40 mg.        Personal Goals Discharge: Goals and Risk Factor Review    Row Name 05/17/18 1033 06/02/18 1125           Core Components/Risk Factors/Patient Goals Review   Personal Goals Review  Weight Management/Obesity;Lipids;Hypertension  Weight Management/Obesity      Review  ChestZuriaking all meds as directed.  He isnt checking BP at home but has a monitor.  He feels he is eating a little better after meeting with Lisa.Lattie Haw feels he has more stamina since he has been exercising.  Emersyn weighed 208lbs today. ChestPorfiriostill taking his medicines as directed. ChestNetaneles that it wasn't such bad weather outside so I gave him printed [facility] since [facility] is suppose to be opened in the am for people to walk.       Expected Outcomes  Short - take BP at home - review home ex with EP Long - maintain exercise on his own  Cont to exericse here in Cardiac Rehab [facility] [place]      Exercise Goals and Review: Exercise Goals    Row Name 05/02/18 1501             Exercise Goals   Increase Physical Activity  Yes  Intervention  Provide advice, education, support and counseling about physical activity/exercise needs.;Develop an individualized exercise prescription for aerobic and resistive training based on initial evaluation findings, risk stratification, comorbidities and participant's personal goals.       Expected Outcomes  Short Term: Attend rehab on a regular basis to increase amount of physical activity.;Long Term: Add in home exercise to make exercise part of routine and to increase amount of physical activity.;Long Term: Exercising regularly at least 3-5 days a week.       Increase Strength and Stamina  Yes       Intervention  Provide advice, education, support and counseling about physical activity/exercise needs.;Develop an individualized exercise prescription for aerobic and resistive training based on initial evaluation findings, risk stratification, comorbidities and participant's personal goals.       Expected Outcomes  Short Term: Increase workloads from initial exercise prescription for resistance, speed, and METs.;Short Term: Perform resistance training exercises routinely during rehab and add in resistance training at home;Long Term: Improve cardiorespiratory fitness, muscular endurance and strength as measured by increased METs and functional capacity (6MWT)       Able to understand and use  rate of perceived exertion (RPE) scale  Yes       Intervention  Provide education and explanation on how to use RPE scale       Expected Outcomes  Short Term: Able to use RPE daily in rehab to express subjective intensity level;Long Term:  Able to use RPE to guide intensity level when exercising independently       Knowledge and understanding of Target Heart Rate Range (THRR)  Yes       Intervention  Provide education and explanation of THRR including how the numbers were predicted and where they are located for reference       Expected Outcomes  Short Term: Able to state/look up THRR;Short Term: Able to use daily as guideline for intensity in rehab;Long Term: Able to use THRR to govern intensity when exercising independently       Able to check pulse independently  Yes       Intervention  Provide education and demonstration on how to check pulse in carotid and radial arteries.;Review the importance of being able to check your own pulse for safety during independent exercise       Expected Outcomes  Short Term: Able to explain why pulse checking is important during independent exercise;Long Term: Able to check pulse independently and accurately       Understanding of Exercise Prescription  Yes       Intervention  Provide education, explanation, and written materials on patient's individual exercise prescription       Expected Outcomes  Short Term: Able to explain program exercise prescription;Long Term: Able to explain home exercise prescription to exercise independently          Exercise Goals Re-Evaluation: Exercise Goals Re-Evaluation    Row Name 05/03/18 1109 05/10/18 1559 05/12/18 1054 05/23/18 1610 06/07/18 1538     Exercise Goal Re-Evaluation   Exercise Goals Review  Increase Physical Activity;Increase Strength and Stamina;Able to understand and use rate of perceived exertion (RPE) scale;Knowledge and understanding of Target Heart Rate Range (THRR);Understanding of Exercise Prescription   Increase Physical Activity;Increase Strength and Stamina;Understanding of Exercise Prescription  Increase Physical Activity;Increase Strength and Stamina;Able to understand and use rate of perceived exertion (RPE) scale;Knowledge and understanding of Target Heart Rate Range (THRR);Able to check pulse independently  Increase Physical Activity;Increase Strength and Stamina;Understanding of  Exercise Prescription  Increase Physical Activity;Increase Strength and Stamina;Understanding of Exercise Prescription   Comments  Reviewed RPE scale, THR and program prescription with pt today.  Pt voiced understanding and was given a copy of goals to take home.   Merwin is off to a good start in rehab.  He has completed his first three full days of exercise.  He is now up to level 4 on the NuStep. We will continue to moniotr his progression.    Reviewed home exercise with pt today.  Pt plans to use gym at apartment complex for exercise.  Reviewed THR, pulse, RPE, sign and symptoms, NTG use, and when to call 911 or MD.  Also discussed weather considerations and indoor options.  Pt voiced understanding.  Jaydyn is doing well in rehab.  He is now up to 3.1 METs on the XR.  We will continue to monitor his workloads.   Fredis continues to do well in rehab.  He is up to using 5 lbs handweights.  His PHQ improved today.  We will continue to try to increase his METs on the NuStep.  We will continue to monitor his progress.    Expected Outcomes  Short: Use RPE daily to regulate intensity. Long: Follow program prescription in THR.  Short: Increase workloads and review home exercise guidelines.  Long: Continue to follow program prescription.   Short - increase walking time to 30 min from 15 - practice taking HR Long - maintain exercise on his own  Short: Increase T5 NuStep.  Long: Continue to increase strength and stamina.   Short: Increase T5 NuStep.  Long: Continue to increase physcial activity!!   Row Name 06/20/18 1634              Exercise Goal Re-Evaluation   Comments  Garry has been doing well in rehab.  He is up to 4.7 METs on the NuStep.  We will continue to monitor his progress.        Expected Outcomes  Short: Continue to increase METs.  Long: Continue to exercise more at home.           Nutrition & Weight - Outcomes: Pre Biometrics - 05/02/18 1502      Pre Biometrics   Height  5' 11.1" (1.806 m)    Weight  202 lb 8 oz (91.9 kg)    Waist Circumference  40 inches    Hip Circumference  38 inches    Waist to Hip Ratio  1.05 %    BMI (Calculated)  28.16    Single Leg Stand  8.07 seconds      Post Biometrics - 06/23/18 1228       Post  Biometrics   Height  5' 11.1" (1.806 m)    Weight  205 lb (93 kg)    Waist Circumference  41 inches    Hip Circumference  41 inches    Waist to Hip Ratio  1 %    BMI (Calculated)  28.51    Single Leg Stand  9.02 seconds       Nutrition: Nutrition Therapy & Goals - 05/03/18 1108      Nutrition Therapy   Diet  TLC    Drug/Food Interactions  Statins/Certain Fruits    Protein (specify units)  9oz    Fiber  30 grams    Whole Grain Foods  3 servings   chooses whole grain bread   Saturated Fats  15 max. grams    Fruits and  Vegetables  5 servings/day   8 ideal; eats more vegetables than fruits   Sodium  1500 grams      Personal Nutrition Goals   Nutrition Goal  Include at least one additional serving of fish per week    Personal Goal #2  Try breakfast options that are higher in fiber and lower in saturated/trans fats. For example, swap your sausage + egg biscuit for canadian bacon + egg english muffin or whole grain bread sandwich or choosing oatmeal instead.     Comments  He feels that there are some areas of his diet that he and his wife couple improve, particularly with the types of fats and proteins they eat. His usual breakfast is cereal or a sausage and egg biscuit sandwich. For lunch he eats sandwiches on wheat bread or salads. For dinners they eat at  home 3 days/week and eat out 4 days/week typically. At home his wife cooks chicken or occasionally steak with vegetables / salad and sometimes starches like baked idaho/sweet potatoes or pasta. If eating out they try to choose restaurants rather than fast food; recently [place] and [employer]. He does not add salt to meals post-preparation. Beverages: water, juice, diet soda, non-fat milk. They do not typically buy canned foods or pre-packaged meals. Snack foods include popcorn, cheese crackers, or yogurt.      Intervention Plan   Intervention  Prescribe, educate and counsel regarding individualized specific dietary modifications aiming towards targeted core components such as weight, hypertension, lipid management, diabetes, heart failure and other comorbidities.;Nutrition handout(s) given to patient.   General heart healthy eating powerpoint packet   Expected Outcomes  Short Term Goal: A plan has been developed with personal nutrition goals set during dietitian appointment.;Long Term Goal: Adherence to prescribed nutrition plan.;Short Term Goal: Understand basic principles of dietary content, such as calories, fat, sodium, cholesterol and nutrients.       Nutrition Discharge: Nutrition Assessments - 06/23/18 1229      MEDFICTS Scores   Pre Score  28    Post Score  --   declined post nutrition assessment      Education Questionnaire Score: Knowledge Questionnaire Score - 06/23/18 1229      Knowledge Questionnaire Score   Pre Score  22/26    Post Score  24/26       Goals reviewed with patient; copy given to patient.

## 2018-07-07 NOTE — Progress Notes (Signed)
Daily Session Note  Patient Details  Name: David Mccullough MRN: [id number] Date of Birth: Aug 16, 1945 Referring Provider:     Cardiac Rehab from 05/02/2018 in [facility]  Referring Provider  [name] MD [Local Cardiologist: [name], MD]      Encounter Date: 07/07/2018  Check In: Session Check In - 07/07/18 0913      Check-In   Supervising physician immediately available to respond to emergencies  See telemetry face sheet for immediately available ER MD    Location  [facility]    Staff Present  Gerlene Burdock, RN, BSN;Jeanna Durrell BS, Exercise Physiologist;Jessica [place], MA, RCEP, CCRP, Exercise Physiologist    Medication changes reported      No    Fall or balance concerns reported     No    Tobacco Cessation  No Change    Warm-up and Cool-down  Performed as group-led instruction    Resistance Training Performed  Yes    VAD Patient?  No    PAD/SET Patient?  No      Pain Assessment   Currently in Pain?  No/denies    Multiple Pain Sites  No          Social History   Tobacco Use  Smoking Status Former Smoker  . Packs/day: 1.00  . Years: 25.00  . Pack years: 25.00  . Types: Cigarettes  . Last attempt to quit: 06/30/1987  . Years since quitting: 31.0  Smokeless Tobacco Never Used    Goals Met:  Independence with exercise equipment Exercise tolerated well No report of cardiac concerns or symptoms Strength training completed today  Goals Unmet:  Not Applicable  Comments: Pt able to follow exercise prescription today without complaint.  Will continue to monitor for progression.   David Mccullough graduated today from  rehab with 36 sessions completed.  Details of the patient's exercise prescription and what He needs to do in order to continue the prescription and progress were discussed with patient.  Patient was given a copy of prescription and goals.  Patient verbalized understanding.  David Mccullough plans to continue to  exercise by going to gym in complex.   Dr. Emily Filbert is Medical Director for [name].

## 2018-07-07 NOTE — Progress Notes (Signed)
Cardiac Individual Treatment Plan  Patient Details  Name: David Mccullough MRN: [id number] Date of Birth: [birth year] Referring Provider:     Cardiac Rehab from 05/02/2018 in [facility] Cardiac and Pulmonary Rehab  Referring Provider  [name] MD [Local Cardiologist: [name], MD]      Initial Encounter Date:    Cardiac Rehab from 05/02/2018 in [facility] Cardiac and Pulmonary Rehab  Date  05/02/18      Visit Diagnosis: S/P CABG x 3  Patient's Home Medications on Admission:  Current Outpatient Medications:  .  aspirin 81 MG chewable tablet, Chew 81 mg by mouth at bedtime. , Disp: , Rfl:  .  atorvastatin (LIPITOR) 80 MG tablet, Take 1 tablet (80 mg total) by mouth daily at 6 PM., Disp: 30 tablet, Rfl: 2 .  Calcium Carb-Cholecalciferol (CALCIUM 1000 + D PO), Take 1 tablet by mouth daily. , Disp: , Rfl:  .  clopidogrel (PLAVIX) 75 MG tablet, TAKE 1 TABLET BY MOUTH DAILY, Disp: 30 tablet, Rfl: 2 .  furosemide (LASIX) 40 MG tablet, Take 1 tablet (40 mg total) by mouth daily as needed. only on days that you gain 3 pounds. Weigh yourself each morning, Disp: 30 tablet, Rfl: 0 .  Multiple Vitamin (MULTIVITAMIN) tablet, Take 1 tablet by mouth daily., Disp: , Rfl:  .  Omega-3 Fatty Acids (FISH OIL) 1200 MG CAPS, Take 1 capsule by mouth daily. , Disp: , Rfl:  .  potassium chloride SA (K-DUR,KLOR-CON) 20 MEQ tablet, Take 1 tablet (20 mEq total) by mouth daily as needed. Only on days that you take a dose of lasix, Disp: 30 tablet, Rfl: 0 .  spironolactone (ALDACTONE) 25 MG tablet, Take 1 tablet (25 mg total) by mouth daily., Disp: 30 tablet, Rfl: 2 .  traZODone (DESYREL) 50 MG tablet, Take 1 tablet (50 mg total) by mouth at bedtime., Disp: 30 tablet, Rfl: 4  Past Medical History: Past Medical History:  Diagnosis Date  . CAD (coronary artery disease)   . Colon polyps   . Depression   . Elevated PSA   . Myocardial infarction ([place])    x 2  . Persistent atrial fibrillation   . Tachycardia      Tobacco Use: Social History   Tobacco Use  Smoking Status Former Smoker  . Packs/day: 1.00  . Years: 25.00  . Pack years: 25.00  . Types: Cigarettes  . Last attempt to quit: 06/30/1987  . Years since quitting: 31.0  Smokeless Tobacco Never Used    Labs: Recent Review Flowsheet Data    Labs for ITP Cardiac and Pulmonary Rehab Latest Ref Rng & Units 03/27/2018 03/28/2018 03/29/2018 03/29/2018 03/30/2018   Cholestrol 0 - 200 mg/dL - - - - -   LDLCALC 0 - 99 mg/dL - - - - -   HDL >40 mg/dL - - - - -   Trlycerides <150 mg/dL - - - - -   Hemoglobin A1c 4.8 - 5.6 % - - - - -   PHART 7.350 - 7.450 - - - - -   PCO2ART 32.0 - 48.0 mmHg - - - - -   HCO3 20.0 - 28.0 mmol/L - - - - -   TCO2 22 - 32 mmol/L - - - - -   ACIDBASEDEF 0.0 - 2.0 mmol/L - - - - -   O2SAT % 54.1 56.6 49.4 48.3 51.2       Exercise Target Goals: Exercise Program Goal: Individual exercise prescription set using results from initial 6  min walk test and THRR while considering  patient's activity barriers and safety.   Exercise Prescription Goal: Initial exercise prescription builds to 30-45 minutes a day of aerobic activity, 2-3 days per week.  Home exercise guidelines will be given to patient during program as part of exercise prescription that the participant will acknowledge.  Activity Barriers & Risk Stratification: Activity Barriers & Cardiac Risk Stratification - 05/02/18 1347      Activity Barriers & Cardiac Risk Stratification   Activity Barriers  Deconditioning;Muscular Weakness    Cardiac Risk Stratification  High       6 Minute Walk: 6 Minute Walk    Row Name 05/02/18 1456 06/23/18 1228       6 Minute Walk   Phase  Initial  Discharge    Distance  1264 feet  1540 feet    Distance % Change  -  21.8 %    Distance Feet Change  -  276 ft    Walk Time  6 minutes  6 minutes    MPH  2.39  2.92    METS  2.59  3.34    RPE  11  11    VO2 Peak  9.06  11.7    Symptoms  No  No    Resting HR  76 bpm   94 bpm    Resting BP  136/66  126/60    Resting Oxygen Saturation   100 %  -    Exercise Oxygen Saturation  during 6 min walk  98 %  -    Max Ex. HR  81 bpm  117 bpm    Max Ex. BP  144/70  134/62    2 Minute Post BP  136/62  -       Oxygen Initial Assessment:   Oxygen Re-Evaluation:   Oxygen Discharge (Final Oxygen Re-Evaluation):   Initial Exercise Prescription: Initial Exercise Prescription - 05/02/18 1400      Date of Initial Exercise RX and Referring Provider   Date  05/02/18    Referring Provider  [name] MD   Local Cardiologist: [name], MD     Treadmill   MPH  1.9    Grade  0.5    Minutes  15    METs  2.59      REL-XR   Level  1    Speed  50    Minutes  15    METs  2.5      T5 Nustep   Level  1    SPM  80    Minutes  15    METs  2.5      Prescription Details   Frequency (times per week)  2    Duration  Progress to 30 minutes of continuous aerobic without signs/symptoms of physical distress      Intensity   THRR 40-80% of Max Heartrate  104-133    Ratings of Perceived Exertion  11-13    Perceived Dyspnea  0-4      Progression   Progression  Continue to progress workloads to maintain intensity without signs/symptoms of physical distress.      Resistance Training   Training Prescription  Yes    Weight  4 lbs    Reps  10-15       Perform Capillary Blood Glucose checks as needed.  Exercise Prescription Changes: Exercise Prescription Changes    Row Name 05/02/18 1300 05/10/18 1600 05/23/18 1600 06/07/18 1500 06/20/18 1600  Response to Exercise   Blood Pressure (Admit)  136/66  124/64  118/62  112/70  104/68   Blood Pressure (Exercise)  144/70  136/78  168/78  130/60  148/80   Blood Pressure (Exit)  136/62  114/60  100/72  126/62  104/60   Heart Rate (Admit)  76 bpm  71 bpm  75 bpm  82 bpm  76 bpm   Heart Rate (Exercise)  81 bpm  115 bpm  113 bpm  116 bpm  111 bpm   Heart Rate (Exit)  74 bpm  85 bpm  81 bpm  86 bpm  77  bpm   Oxygen Saturation (Admit)  100 %  -  -  -  -   Oxygen Saturation (Exercise)  98 %  -  -  -  -   Rating of Perceived Exertion (Exercise)  _0 Symptoms  none  none  none  none  none   Comments  walk test results  -  -  -  -   Duration  -  Continue with 30 min of aerobic exercise without signs/symptoms of physical distress.  Continue with 30 min of aerobic exercise without signs/symptoms of physical distress.  Continue with 30 min of aerobic exercise without signs/symptoms of physical distress.  Continue with 30 min of aerobic exercise without signs/symptoms of physical distress.   Intensity  -  THRR unchanged  THRR unchanged  THRR unchanged  THRR unchanged     Progression   Progression  -  Continue to progress workloads to maintain intensity without signs/symptoms of physical distress.  Continue to progress workloads to maintain intensity without signs/symptoms of physical distress.  Continue to progress workloads to maintain intensity without signs/symptoms of physical distress.  Continue to progress workloads to maintain intensity without signs/symptoms of physical distress.   Average METs  -  2.13  2.63  3.55  3.74     Resistance Training   Training Prescription  -  Yes  Yes  Yes  Yes   Weight  -  4 lbs  4 lbs  5 lbs  5 lbs   Reps  -  10-15  10-15  10-15  10-15     Interval Training   Interval Training  -  No  No  No  No     Treadmill   MPH  -  1.9  1._1 Grade  -  0.5  0._2 Minutes  -  _3 METs  -  2.59  2.59  4.54  4.54     REL-XR   Level  -  _4 Minutes  -  _5 METs  -  1.8  3.1  4.1  4.7     T5 Nustep   Level  -  _6 Minutes  -  _7 METs  -  2  2._8 Home Exercise Plan   Plans to continue exercise at  -  -  Home (comment) gym in complex  Home (comment) gym in complex  Home (comment) gym in complex   Frequency  -  -  Add 3 additional days  to program exercise sessions.  Add 3  additional days to program exercise sessions.  Add 3 additional days to program exercise sessions.   Initial Home Exercises Provided  -  -  05/12/18  05/12/18  05/12/18      Exercise Comments: Exercise Comments    Row Name 05/03/18 1109 05/12/18 1054 07/07/18 0929       Exercise Comments  First full day of exercise!  Patient was oriented to gym and equipment including functions, settings, policies, and procedures.  Patient's individual exercise prescription and treatment plan were reviewed.  All starting workloads were established based on the results of the 6 minute walk test done at initial orientation visit.  The plan for exercise progression was also introduced and progression will be customized based on patient's performance and goals.   Reviewed home exercise with pt today.  Pt plans to use gym at apartment complex for exercise.  Reviewed THR, pulse, RPE, sign and symptoms, NTG use, and when to call 911 or MD.  Also discussed weather considerations and indoor options.  Pt voiced understanding.   Daeton graduated today from  rehab with 36 sessions completed.  Details of the patient's exercise prescription and what He needs to do in order to continue the prescription and progress were discussed with patient.  Patient was given a copy of prescription and goals.  Patient verbalized understanding.  Pratik plans to continue to exercise by going to gym in complex.        Exercise Goals and Review: Exercise Goals    Row Name 05/02/18 1501             Exercise Goals   Increase Physical Activity  Yes       Intervention  Provide advice, education, support and counseling about physical activity/exercise needs.;Develop an individualized exercise prescription for aerobic and resistive training based on initial evaluation findings, risk stratification, comorbidities and participant's personal goals.       Expected Outcomes  Short Term: Attend rehab on a regular basis to increase amount of physical  activity.;Long Term: Add in home exercise to make exercise part of routine and to increase amount of physical activity.;Long Term: Exercising regularly at least 3-5 days a week.       Increase Strength and Stamina  Yes       Intervention  Provide advice, education, support and counseling about physical activity/exercise needs.;Develop an individualized exercise prescription for aerobic and resistive training based on initial evaluation findings, risk stratification, comorbidities and participant's personal goals.       Expected Outcomes  Short Term: Increase workloads from initial exercise prescription for resistance, speed, and METs.;Short Term: Perform resistance training exercises routinely during rehab and add in resistance training at home;Long Term: Improve cardiorespiratory fitness, muscular endurance and strength as measured by increased METs and functional capacity (6MWT)       Able to understand and use rate of perceived exertion (RPE) scale  Yes       Intervention  Provide education and explanation on how to use RPE scale       Expected Outcomes  Short Term: Able to use RPE daily in rehab to express subjective intensity level;Long Term:  Able to use RPE to guide intensity level when exercising independently       Knowledge and understanding of Target Heart Rate Range (THRR)  Yes       Intervention  Provide education and explanation of THRR including how the numbers were predicted and where they are  located for reference       Expected Outcomes  Short Term: Able to state/look up THRR;Short Term: Able to use daily as guideline for intensity in rehab;Long Term: Able to use THRR to govern intensity when exercising independently       Able to check pulse independently  Yes       Intervention  Provide education and demonstration on how to check pulse in carotid and radial arteries.;Review the importance of being able to check your own pulse for safety during independent exercise       Expected  Outcomes  Short Term: Able to explain why pulse checking is important during independent exercise;Long Term: Able to check pulse independently and accurately       Understanding of Exercise Prescription  Yes       Intervention  Provide education, explanation, and written materials on patient's individual exercise prescription       Expected Outcomes  Short Term: Able to explain program exercise prescription;Long Term: Able to explain home exercise prescription to exercise independently          Exercise Goals Re-Evaluation : Exercise Goals Re-Evaluation    Row Name 05/03/18 1109 05/10/18 1559 05/12/18 1054 05/23/18 1610 06/07/18 1538     Exercise Goal Re-Evaluation   Exercise Goals Review  Increase Physical Activity;Increase Strength and Stamina;Able to understand and use rate of perceived exertion (RPE) scale;Knowledge and understanding of Target Heart Rate Range (THRR);Understanding of Exercise Prescription  Increase Physical Activity;Increase Strength and Stamina;Understanding of Exercise Prescription  Increase Physical Activity;Increase Strength and Stamina;Able to understand and use rate of perceived exertion (RPE) scale;Knowledge and understanding of Target Heart Rate Range (THRR);Able to check pulse independently  Increase Physical Activity;Increase Strength and Stamina;Understanding of Exercise Prescription  Increase Physical Activity;Increase Strength and Stamina;Understanding of Exercise Prescription   Comments  Reviewed RPE scale, THR and program prescription with pt today.  Pt voiced understanding and was given a copy of goals to take home.   Kert is off to a good start in rehab.  He has completed his first three full days of exercise.  He is now up to level 4 on the NuStep. We will continue to moniotr his progression.    Reviewed home exercise with pt today.  Pt plans to use gym at apartment complex for exercise.  Reviewed THR, pulse, RPE, sign and symptoms, NTG use, and when to call 911  or MD.  Also discussed weather considerations and indoor options.  Pt voiced understanding.  Grae is doing well in rehab.  He is now up to 3.1 METs on the XR.  We will continue to monitor his workloads.   Add continues to do well in rehab.  He is up to using 5 lbs handweights.  His PHQ improved today.  We will continue to try to increase his METs on the NuStep.  We will continue to monitor his progress.    Expected Outcomes  Short: Use RPE daily to regulate intensity. Long: Follow program prescription in THR.  Short: Increase workloads and review home exercise guidelines.  Long: Continue to follow program prescription.   Short - increase walking time to 30 min from 15 - practice taking HR Long - maintain exercise on his own  Short: Increase T5 NuStep.  Long: Continue to increase strength and stamina.   Short: Increase T5 NuStep.  Long: Continue to increase physcial activity!!   Row Name 06/20/18 [phone]  Exercise Goal Re-Evaluation   Comments  Gracyn has been doing well in rehab.  He is up to 4.7 METs on the NuStep.  We will continue to monitor his progress.        Expected Outcomes  Short: Continue to increase METs.  Long: Continue to exercise more at home.           Discharge Exercise Prescription (Final Exercise Prescription Changes): Exercise Prescription Changes - 06/20/18 1600      Response to Exercise   Blood Pressure (Admit)  104/68    Blood Pressure (Exercise)  148/80    Blood Pressure (Exit)  104/60    Heart Rate (Admit)  76 bpm    Heart Rate (Exercise)  111 bpm    Heart Rate (Exit)  77 bpm    Rating of Perceived Exertion (Exercise)  12    Symptoms  none    Duration  Continue with 30 min of aerobic exercise without signs/symptoms of physical distress.    Intensity  THRR unchanged      Progression   Progression  Continue to progress workloads to maintain intensity without signs/symptoms of physical distress.    Average METs  3.74      Resistance Training    Training Prescription  Yes    Weight  5 lbs    Reps  10-15      Interval Training   Interval Training  No      Treadmill   MPH  3    Grade  3    Minutes  15    METs  4.54      REL-XR   Level  5    Minutes  15    METs  4.7      T5 Nustep   Level  4    Minutes  15    METs  2      Home Exercise Plan   Plans to continue exercise at  Home (comment)   gym in complex   Frequency  Add 3 additional days to program exercise sessions.    Initial Home Exercises Provided  05/12/18       Nutrition:  Target Goals: Understanding of nutrition guidelines, daily intake of sodium <1519m, cholesterol <2059m calories 30% from fat and 7% or less from saturated fats, daily to have 5 or more servings of fruits and vegetables.  Biometrics: Pre Biometrics - 05/02/18 1502      Pre Biometrics   Height  5' 11.1" (1.806 m)    Weight  202 lb 8 oz (91.9 kg)    Waist Circumference  40 inches    Hip Circumference  38 inches    Waist to Hip Ratio  1.05 %    BMI (Calculated)  28.16    Single Leg Stand  8.07 seconds      Post Biometrics - 06/23/18 1228       Post  Biometrics   Height  5' 11.1" (1.806 m)    Weight  205 lb (93 kg)    Waist Circumference  41 inches    Hip Circumference  41 inches    Waist to Hip Ratio  1 %    BMI (Calculated)  28.51    Single Leg Stand  9.02 seconds       Nutrition Therapy Plan and Nutrition Goals: Nutrition Therapy & Goals - 05/03/18 1108      Nutrition Therapy   Diet  TLC    Drug/Food Interactions  Statins/Certain Fruits  Protein (specify units)  9oz    Fiber  30 grams    Whole Grain Foods  3 servings   chooses whole grain bread   Saturated Fats  15 max. grams    Fruits and Vegetables  5 servings/day   8 ideal; eats more vegetables than fruits   Sodium  1500 grams      Personal Nutrition Goals   Nutrition Goal  Include at least one additional serving of fish per week    Personal Goal #2  Try breakfast options that are higher in fiber and  lower in saturated/trans fats. For example, swap your sausage + egg biscuit for canadian bacon + egg english muffin or whole grain bread sandwich or choosing oatmeal instead.     Comments  He feels that there are some areas of his diet that he and his wife couple improve, particularly with the types of fats and proteins they eat. His usual breakfast is cereal or a sausage and egg biscuit sandwich. For lunch he eats sandwiches on wheat bread or salads. For dinners they eat at home 3 days/week and eat out 4 days/week typically. At home his wife cooks chicken or occasionally steak with vegetables / salad and sometimes starches like baked [place]aho/sweet potatoes or pasta. If eating out they try to choose restaurants rather than fast food; recently [place] and [employer]. He does not add salt to meals post-preparation. Beverages: water, juice, diet soda, non-fat milk. They do not typically buy canned foods or pre-packaged meals. Snack foods include popcorn, cheese crackers, or yogurt.      Intervention Plan   Intervention  Prescribe, educate and counsel regarding individualized specific dietary modifications aiming towards targeted core components such as weight, hypertension, lipid management, diabetes, heart failure and other comorbidities.;Nutrition handout(s) given to patient.   General heart healthy eating powerpoint packet   Expected Outcomes  Short Term Goal: A plan has been developed with personal nutrition goals set during dietitian appointment.;Long Term Goal: Adherence to prescribed nutrition plan.;Short Term Goal: Understand basic principles of dietary content, such as calories, fat, sodium, cholesterol and nutrients.       Nutrition Assessments: Nutrition Assessments - 06/23/18 1229      MEDFICTS Scores   Pre Score  28    Post Score  --   declined post nutrition assessment      Nutrition Goals Re-Evaluation: Nutrition Goals Re-Evaluation    [place] Name 05/19/18 1007 06/02/18 1054 06/30/18 1014           Goals   Current Weight  -  201 lb (91.2 kg)  -     Nutrition Goal  Include at least one additional serving of fish per week; Try breakfast options that are higher in fiber and lower in saturated / trans fats.  Keep eating one serving of fish per week.  Include at least one additional serving of fish per week; Try breakfast options that are higher in fiber and lower in saturated/ trans fats     Comment  He and his wife have been buying more fish to eat but he has not yet tried different breakfast options. They have not been going out to eat much but try to make healthier choices such as salads when they do  He and his wife are trying to eating better. Moshe said it is "hard to eat healthy but we are trying to eat more salads.".  He has been eating fish more regularly and is following a NAS diet as much  as he can. He purchased oatmeal to trial as a new breakfast option but has not yet tried it. This morning he had a biscuit and eggs for breakfast. He reports his wt to be stable and has had no changes in appetite.      Expected Outcome  They will maintain increased intake of fish as opposed to less lean protein sources. Long term goal: trial breakfast options that are lower in saturated and trans fats  Still trying to increase fish intake.  He will trial oatmeal as a higher fiber, lower fat breakfast option. Keep eggs along with oatmeal or add another protein source such as peanut butter. Continue to follow a NAS diet and to include fish/ seafood regularly as part of a heart-healthy diet       Personal Goal #2 Re-Evaluation   Personal Goal #2  -  Boniface has eaten oatmeal as a healthier option.  -        Nutrition Goals Discharge (Final Nutrition Goals Re-Evaluation): Nutrition Goals Re-Evaluation - 06/30/18 1014      Goals   Nutrition Goal  Include at least one additional serving of fish per week; Try breakfast options that are higher in fiber and lower in saturated/ trans fats    Comment   He has been eating fish more regularly and is following a NAS diet as much as he can. He purchased oatmeal to trial as a new breakfast option but has not yet tried it. This morning he had a biscuit and eggs for breakfast. He reports his wt to be stable and has had no changes in appetite.     Expected Outcome  He will trial oatmeal as a higher fiber, lower fat breakfast option. Keep eggs along with oatmeal or add another protein source such as peanut butter. Continue to follow a NAS diet and to include fish/ seafood regularly as part of a heart-healthy diet       Psychosocial: Target Goals: Acknowledge presence or absence of significant depression and/or stress, maximize coping skills, provide positive support system. Participant is able to verbalize types and ability to use techniques and skills needed for reducing stress and depression.   Initial Review & Psychosocial Screening: Initial Psych Review & Screening - 05/02/18 1342      Initial Review   Current issues with  Current Stress Concerns;History of Depression    Source of Stress Concerns  Family    Comments  Mr. Boissonneault son struggles with Bipolar. He is currently homeless, in and out of the hospital and on and off his meds. Mr. Ferraris and his wife reported this is a major stressful for their family. Mrs. Bonn expressed concern for her husband that before they moved from [facility] Mr. Domine was seeing a psychiatrist. His psychiatrist had taken him off his depression medications because he was doing so well. They both are concerned he may need to go back on them since his heart surgery and recent events with his son. They were encouraged to reach out to his past psychiatrist to either see him or be referred to someone local.       [place]?  Yes   wife, friends     Barriers   Psychosocial barriers to participate in program  The patient should benefit from training in stress management and relaxation.       Screening Interventions   Interventions  Encouraged to exercise;Program counselor consult;To provide support and resources with identified psychosocial  needs;Provide feedback about the scores to participant    Expected Outcomes  Short Term goal: Utilizing psychosocial counselor, staff and physician to assist with identification of specific Stressors or current issues interfering with healing process. Setting desired goal for each stressor or current issue identified.;Long Term Goal: Stressors or current issues are controlled or eliminated.;Short Term goal: Identification and review with participant of any Quality of Life or Depression concerns found by scoring the questionnaire.;Long Term goal: The participant improves quality of Life and PHQ9 Scores as seen by post scores and/or verbalization of changes       Quality of Life Scores:  Quality of Life - 06/23/18 1229      Quality of Life   Select  Quality of Life      Quality of Life Scores   Health/Function Pre  15.85 %    Health/Function Post  25.43 %    Health/Function % Change  60.44 %    Socioeconomic Pre  24 %    Socioeconomic Post  24.43 %    Socioeconomic %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

## 2018-07-13 DIAGNOSIS — Z951 Presence of aortocoronary bypass graft: Secondary | ICD-10-CM | POA: Diagnosis not present

## 2018-07-13 DIAGNOSIS — R972 Elevated prostate specific antigen [PSA]: Secondary | ICD-10-CM | POA: Diagnosis not present

## 2018-07-13 DIAGNOSIS — E782 Mixed hyperlipidemia: Secondary | ICD-10-CM | POA: Diagnosis not present

## 2018-07-13 DIAGNOSIS — I10 Essential (primary) hypertension: Secondary | ICD-10-CM | POA: Diagnosis not present

## 2018-07-13 DIAGNOSIS — I2510 Atherosclerotic heart disease of native coronary artery without angina pectoris: Secondary | ICD-10-CM | POA: Diagnosis not present

## 2018-07-13 DIAGNOSIS — I214 Non-ST elevation (NSTEMI) myocardial infarction: Secondary | ICD-10-CM | POA: Diagnosis not present

## 2018-07-13 DIAGNOSIS — I480 Paroxysmal atrial fibrillation: Secondary | ICD-10-CM | POA: Diagnosis not present

## 2018-07-21 ENCOUNTER — Telehealth: Payer: Self-pay

## 2018-07-21 NOTE — Telephone Encounter (Signed)
LM wanting Urology Referral. Advised needs OV to check PSA and then decide if needs Referral based on labs. Hx of elevated PSA. Scheduled 08/02/2018 8 am.

## 2018-07-29 ENCOUNTER — Telehealth (HOSPITAL_COMMUNITY): Payer: Self-pay | Admitting: *Deleted

## 2018-07-29 NOTE — Telephone Encounter (Signed)
-----   Message from Kara Mead sent at 07/13/2018 10:33 AM EST ----- Regarding: Pt now being seen by Cardiologist Marykay Lex,  Juluis Rainier.Marland KitchenMarland KitchenPt's spouse called stating that pt saw his Cardiologist, Dr. Marcelline Deist this week.  She cancelled pt's 08/10/18 f/u with Dr. Haroldine Laws stating that Dr. Marcelline Deist will now be taking care of the pt full-time.  Thanks!

## 2018-08-02 ENCOUNTER — Encounter: Payer: Self-pay | Admitting: Family Medicine

## 2018-08-02 ENCOUNTER — Ambulatory Visit (INDEPENDENT_AMBULATORY_CARE_PROVIDER_SITE_OTHER): Payer: Medicare Other | Admitting: Family Medicine

## 2018-08-02 VITALS — BP 120/70 | HR 76 | Ht 71.0 in | Wt 206.0 lb

## 2018-08-02 DIAGNOSIS — J40 Bronchitis, not specified as acute or chronic: Secondary | ICD-10-CM | POA: Diagnosis not present

## 2018-08-02 DIAGNOSIS — R351 Nocturia: Secondary | ICD-10-CM | POA: Diagnosis not present

## 2018-08-02 DIAGNOSIS — R972 Elevated prostate specific antigen [PSA]: Secondary | ICD-10-CM | POA: Diagnosis not present

## 2018-08-02 MED ORDER — MONTELUKAST SODIUM 10 MG PO TABS: 10.0000 mg | ORAL_TABLET | Freq: Every day | ORAL | 3 refills | Status: DC

## 2018-08-02 NOTE — Progress Notes (Signed)
Date:  08/02/2018   Name:  David Mccullough   DOB:  09-25-1945   MRN:  [id number]   Chief Complaint: recheck psa (hx of elevated psa- told to have it rechecked. 4.6 and 5.3 in the past) and Cough (no fever, no color to production)  Cough  This is a new problem. The current episode started 1 to 4 weeks ago (3 weeks). The problem has been waxing and waning. The problem occurs hourly. The cough is productive of sputum. Pertinent negatives include no chest pain, chills, ear congestion, ear pain, fever, headaches, heartburn, hemoptysis, myalgias, nasal congestion, postnasal drip, rash, rhinorrhea, sore throat, shortness of breath, sweats, weight loss or wheezing. The symptoms are aggravated by lying down. He has tried nothing for the symptoms. There is no history of asthma, bronchiectasis, bronchitis, COPD, emphysema, environmental allergies or pneumonia.  Benign Prostatic Hypertrophy  This is a chronic (for elevated) problem. The current episode started more than 1 year ago. Irritative symptoms include frequency, nocturia and urgency. Obstructive symptoms include dribbling. Obstructive symptoms do not include incomplete emptying, an intermittent stream, a slower stream, straining or a weak stream. Pertinent negatives include no chills, dysuria, genital pain, hematuria, hesitancy, nausea or vomiting. Past treatments include nothing.    Review of Systems  Constitutional: Negative for chills, fever and weight loss.  HENT: Negative for drooling, ear discharge, ear pain, postnasal drip, rhinorrhea and sore throat.   Respiratory: Positive for cough. Negative for hemoptysis, shortness of breath and wheezing.   Cardiovascular: Negative for chest pain, palpitations and leg swelling.  Gastrointestinal: Negative for abdominal pain, blood in stool, constipation, diarrhea, heartburn, nausea and vomiting.  Endocrine: Negative for polydipsia.  Genitourinary: Positive for frequency, nocturia and urgency. Negative for  dysuria, hematuria, hesitancy and incomplete emptying.  Musculoskeletal: Negative for back pain, myalgias and neck pain.  Skin: Negative for rash.  Allergic/Immunologic: Negative for environmental allergies.  Neurological: Negative for dizziness and headaches.  Hematological: Does not bruise/bleed easily.  Psychiatric/Behavioral: Negative for suicidal ideas. The patient is not nervous/anxious.     Patient Active Problem List   Diagnosis Date Noted  . 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 NSTEMI (non-ST elevated myocardial infarction) ([place]) 03/14/2018    No Known Allergies  Past Surgical History:  Procedure Laterality Date  . CARDIAC ELECTROPHYSIOLOGY STUDY AND ABLATION     patient states due to V-tac  . CORONARY ARTERY BYPASS GRAFT N/A 03/22/2018   Procedure: CORONARY ARTERY BYPASS GRAFTING (CABG) x 3 WITH ENDOSCOPIC HARVESTING OF RIGHT GREATER SAPHENOUS VEIN;  Surgeon: Ivin Poot, MD;  Location: [facility];  Service: Open Heart Surgery;  Laterality: N/A;  . CORONARY STENT INTERVENTION N/A 03/16/2018   Procedure: CORONARY STENT INTERVENTION;  Surgeon: Yolonda Kida, MD;  Location: [facility] [facility];  Service: Cardiovascular;  Laterality: N/A;  . heart bypass    . LEFT ATRIAL APPENDAGE OCCLUSION N/A 03/22/2018   Procedure: LEFT ATRIAL APPENDAGE OCCLUSION USING 40 MM ATRICURE ATRICLIP;  Surgeon: Ivin Poot, MD;  Location: [facility];  Service: Open Heart Surgery;  Laterality: N/A;  . LEFT HEART CATH AND CORONARY ANGIOGRAPHY N/A 03/15/2018   Procedure: LEFT HEART CATH AND CORONARY ANGIOGRAPHY and PCI stent;  Surgeon: Yolonda Kida, MD;  Location: [place] [facility];  Service: Cardiovascular;  Laterality: N/A;  . TEE WITHOUT CARDIOVERSION N/A 03/22/2018   Procedure: TRANSESOPHAGEAL ECHOCARDIOGRAM (TEE);  Surgeon: Prescott Gum, Collier Salina, MD;  Location: [facility];  Service: Open Heart Surgery;  Laterality: N/A;  Social History   Tobacco Use  . Smoking status: Former Smoker    Packs/day: 1.00     Years: 25.00    Pack years: 25.00    Types: Cigarettes    Last attempt to quit: 06/30/1987    Years since quitting: 31.1  . Smokeless tobacco: Never Used  Substance Use Topics  . Alcohol use: Not Currently  . Drug use: Not Currently     Medication list has been reviewed and updated.  Current Meds  Medication Sig  . aspirin 81 MG chewable tablet Chew 81 mg by mouth at bedtime.   Marland Kitchen atorvastatin (LIPITOR) 80 MG tablet Take 1 tablet (80 mg total) by mouth daily at 6 PM.  . Calcium Carb-Cholecalciferol (CALCIUM 1000 + D PO) Take 1 tablet by mouth daily.   . clopidogrel (PLAVIX) 75 MG tablet TAKE 1 TABLET BY MOUTH DAILY  . furosemide (LASIX) 40 MG tablet Take 1 tablet (40 mg total) by mouth daily as needed. only on days that you gain 3 pounds. Weigh yourself each morning  . Multiple Vitamin (MULTIVITAMIN) tablet Take 1 tablet by mouth daily.  . Omega-3 Fatty Acids (FISH OIL) 1200 MG CAPS Take 1 capsule by mouth daily.   . potassium chloride SA (K-DUR,KLOR-CON) 20 MEQ tablet Take 1 tablet (20 mEq total) by mouth daily as needed. Only on days that you take a dose of lasix  . traZODone (DESYREL) 50 MG tablet Take 1 tablet (50 mg total) by mouth at bedtime.  . [DISCONTINUED] spironolactone (ALDACTONE) 25 MG tablet Take 1 tablet (25 mg total) by mouth daily.    PHQ 2/9 Scores 06/23/2018 06/07/2018 05/02/2018 04/14/2018  PHQ - 2 Score 1 2 3  0  PHQ- 9 Score 2 4 6  -    Physical Exam Vitals signs and nursing note reviewed.  HENT:     Head: Normocephalic.     Right Ear: External ear normal.     Left Ear: External ear normal.     Nose: Nose normal.  Eyes:     General: No scleral icterus.       Right eye: No discharge.        Left eye: No discharge.     Conjunctiva/sclera: Conjunctivae normal.     Pupils: Pupils are equal, round, and reactive to light.  Neck:     Musculoskeletal: Normal range of motion and neck supple.     Thyroid: No thyromegaly.     Vascular: No JVD.     Trachea:  No tracheal deviation.  Cardiovascular:     Rate and Rhythm: Normal rate and regular rhythm.     Heart sounds: Normal heart sounds. No murmur. No friction rub. No gallop.   Pulmonary:     Effort: No respiratory distress.     Breath sounds: Normal breath sounds. No wheezing or rales.  Abdominal:     General: Bowel sounds are normal.     Palpations: Abdomen is soft. There is no mass.     Tenderness: There is no abdominal tenderness. There is no guarding or rebound.  Genitourinary:    Prostate: Normal. Not enlarged, not tender and no nodules present.     Rectum: Normal. Guaiac result negative. No mass or tenderness.  Musculoskeletal: Normal range of motion.        General: No tenderness.  Lymphadenopathy:     Cervical: No cervical adenopathy.  Skin:    General: Skin is warm.     Findings: No rash.  Neurological:  Mental Status: He is alert and oriented to person, place, and time.     Cranial Nerves: No cranial nerve deficit.     Deep Tendon Reflexes: Reflexes are normal and symmetric.     BP 120/70   Pulse 76   Ht 5\' 11"  (1.803 m)   Wt 206 lb (93.4 kg)   BMI 28.73 kg/m   Assessment and Plan: 1. Elevated PSA Patient has a history of elevated PSA at previous physician's office.  Patient's DRE exam was unremarkable with no enlargement, symmetry, nodularity.  Will obtain PSA and refer to urology. - Ambulatory referral to Urology - PSA  2. Nocturia And has a history of nocturia.  Will obtain PSA - PSA  3. Bronchitis Patient has a chronic cough with some reactivity of the airways.  Will initiate Singulair 10 mg once a day to take at bedtime. - montelukast (SINGULAIR) 10 MG tablet; Take 1 tablet (10 mg total) by mouth at bedtime.  Dispense: 30 tablet; Refill: 3

## 2018-08-03 LAB — PSA: Prostate Specific Ag, Serum: 3.7 ng/mL (ref 0.0–4.0)

## 2018-08-10 ENCOUNTER — Encounter (HOSPITAL_COMMUNITY): Payer: Medicare Other | Admitting: Internal Medicine

## 2018-09-02 ENCOUNTER — Ambulatory Visit (INDEPENDENT_AMBULATORY_CARE_PROVIDER_SITE_OTHER): Payer: Medicare Other | Admitting: Urology

## 2018-09-02 ENCOUNTER — Encounter: Payer: Self-pay | Admitting: Urology

## 2018-09-02 VITALS — BP 119/67 | HR 74 | Ht 71.0 in | Wt 205.0 lb

## 2018-09-02 DIAGNOSIS — R3915 Urgency of urination: Secondary | ICD-10-CM | POA: Diagnosis not present

## 2018-09-02 DIAGNOSIS — R972 Elevated prostate specific antigen [PSA]: Secondary | ICD-10-CM | POA: Diagnosis not present

## 2018-09-02 LAB — BLADDER SCAN AMB NON-IMAGING

## 2018-09-02 NOTE — Progress Notes (Signed)
09/02/2018 11:45 AM   David Mccullough [birth year] [id number]  Referring provider: Juline Patch, MD [address] [place], [place] [address]  Chief Complaint  Patient presents with  . Elevated PSA    New Patient    HPI: 73 year old male referred for further evaluation of elevated PSA and urinary dribbling/urgency.  He has recently moved back from [facility] permanently and is seeking to establish care with a urologist.  He has a personal history of elevated PSA.  He was previously followed by [facility] urology.  Trend as below.  He is never had a prostate biopsy.  His most recent rectal exam was just 1 month ago by Dr. Ronnald Ramp and reportedly normal.  He is concerned today about urinary leakage, all of the time.  He describes little drops of urine in his underwear all the time and cannot tell exactly when this will happen.  He does not wear pads.  Urinary stream is good, feels like he is able to empty his bladder.  Nocturia x2.  No spraying of this urinary stream.  No hesitancy.  He does report that he occasionally has severe urge to urinate which is somewhat bothersome to him.  He also has dribbling which he describes as constantly and is unable to further describe.  He denies leakage with laughing coughing or sneezing.  He reports that he is damp and is not sure when he is dribbling.   No prostate meds.  No previous prostate surgery.   PSA trend: 3.6 11/18/2015 5.3 02/11/2017 4.1 03/22/2017 4.6 09/22/2017 3.7 on 08/02/2018   PMH: Past Medical History:  Diagnosis Date  . CAD (coronary artery disease)   . Colon polyps   . Depression   . Elevated PSA   . Myocardial infarction ([place])    x 2  . Persistent atrial fibrillation   . Tachycardia     Surgical History: Past Surgical History:  Procedure Laterality Date  . CARDIAC ELECTROPHYSIOLOGY STUDY AND ABLATION     patient states due to V-tac  . CORONARY ARTERY BYPASS GRAFT N/A 03/22/2018   Procedure: CORONARY ARTERY  BYPASS GRAFTING (CABG) x 3 WITH ENDOSCOPIC HARVESTING OF RIGHT GREATER SAPHENOUS VEIN;  Surgeon: Ivin Poot, MD;  Location: [facility];  Service: Open Heart Surgery;  Laterality: N/A;  . CORONARY STENT INTERVENTION N/A 03/16/2018   Procedure: CORONARY STENT INTERVENTION;  Surgeon: Yolonda Kida, MD;  Location: [place] [facility];  Service: Cardiovascular;  Laterality: N/A;  . heart bypass    . LEFT ATRIAL APPENDAGE OCCLUSION N/A 03/22/2018   Procedure: LEFT ATRIAL APPENDAGE OCCLUSION USING 40 MM ATRICURE ATRICLIP;  Surgeon: Ivin Poot, MD;  Location: [facility];  Service: Open Heart Surgery;  Laterality: N/A;  . LEFT HEART CATH AND CORONARY ANGIOGRAPHY N/A 03/15/2018   Procedure: LEFT HEART CATH AND CORONARY ANGIOGRAPHY and PCI stent;  Surgeon: Yolonda Kida, MD;  Location: [place] [facility];  Service: Cardiovascular;  Laterality: N/A;  . TEE WITHOUT CARDIOVERSION N/A 03/22/2018   Procedure: TRANSESOPHAGEAL ECHOCARDIOGRAM (TEE);  Surgeon: Prescott Gum, Collier Salina, MD;  Location: [facility];  Service: Open Heart Surgery;  Laterality: N/A;    Home Medications:  Allergies as of 09/02/2018   No Known Allergies     Medication List       Accurate as of September 02, 2018 11:45 AM. Always use your most recent med list.        atorvastatin 80 MG tablet Commonly known as:  LIPITOR Take  1 tablet (80 mg total) by mouth daily at 6 PM.   potassium chloride SA 20 MEQ tablet Commonly known as:  K-DUR,KLOR-CON Take 1 tablet (20 mEq total) by mouth daily as needed. Only on days that you take a dose of lasix       Allergies: No Known Allergies  Family History: Family History  Problem Relation Age of Onset  . Heart disease Mother   . Heart disease Father   . Arrhythmia Sister   . Heart disease Sister     Social History:  reports that he quit smoking about 31 years ago. His smoking use included cigarettes. He has a 25.00 pack-year smoking history. He has never used smokeless tobacco. He reports  previous alcohol use. He reports previous drug use.  ROS: UROLOGY Frequent Urination?: No Hard to postpone urination?: No Burning/pain with urination?: No Get up at night to urinate?: No Leakage of urine?: Yes Urine stream starts and stops?: No Trouble starting stream?: No Do you have to strain to urinate?: No Blood in urine?: No Urinary tract infection?: No Sexually transmitted disease?: No Injury to kidneys or bladder?: No Painful intercourse?: No Weak stream?: No Erection problems?: No Penile pain?: No  Gastrointestinal Nausea?: No Vomiting?: No Indigestion/heartburn?: No Diarrhea?: No Constipation?: No  Constitutional Fever: No Night sweats?: No Weight loss?: No Fatigue?: No  Skin Skin rash/lesions?: No Itching?: No  Eyes Blurred vision?: No Double vision?: No  Ears/Nose/Throat Sore throat?: No Sinus problems?: No  Hematologic/Lymphatic Swollen glands?: No Easy bruising?: No  Cardiovascular Leg swelling?: No Chest pain?: No  Respiratory Cough?: No Shortness of breath?: No  Endocrine Excessive thirst?: No  Musculoskeletal Back pain?: No Joint pain?: No  Neurological Headaches?: No Dizziness?: No  Psychologic Depression?: No Anxiety?: No  Physical Exam: BP 119/67   Pulse 74   Ht 5\' 11"  (1.803 m)   Wt 205 lb (93 kg)   BMI 28.59 kg/m   Constitutional:  Alert and oriented, No acute distress. HEENT: [place] AT, moist mucus membranes.  Trachea midline, no masses. Cardiovascular: No clubbing, cyanosis, or edema. Respiratory: Normal respiratory effort, no increased work of breathing. GI: Abdomen is soft, nontender, nondistended, no abdominal masses GU: No CVA tenderness Skin: No rashes, bruises or suspicious lesions. Neurologic: Grossly intact, no focal deficits, moving all 4 extremities. Psychiatric: Normal mood and affect.  Laboratory Data: Lab Results  Component Value Date   WBC 13.7 (H) 03/30/2018   HGB 10.6 (L) 03/30/2018   HCT  32.9 (L) 03/30/2018   MCV 87.7 03/30/2018   PLT 414 (H) 03/30/2018    Lab Results  Component Value Date   CREATININE 1.25 (H) 04/25/2018    Lab Results  Component Value Date   HGBA1C 5.2 03/17/2018    Urinalysis    Component Value Date/Time   COLORURINE YELLOW 03/30/2018 1000   APPEARANCEUR CLEAR 03/30/2018 1000   LABSPEC 1.015 03/30/2018 1000   PHURINE 6.0 03/30/2018 1000   GLUCOSEU NEGATIVE 03/30/2018 1000   HGBUR SMALL (A) 03/30/2018 1000   BILIRUBINUR NEGATIVE 03/30/2018 1000   KETONESUR NEGATIVE 03/30/2018 1000   PROTEINUR NEGATIVE 03/30/2018 1000   NITRITE NEGATIVE 03/30/2018 1000   LEUKOCYTESUR NEGATIVE 03/30/2018 1000    Lab Results  Component Value Date   BACTERIA NONE SEEN 03/30/2018    Pertinent Imaging: PVR 26 cc  Assessment & Plan:    1. Elevated PSA Personal history of elevated PSA Most recent PSA is back to his baseline which is reassuring Recent rectal exam by  primary care unremarkable I recommended continuation of annual screening till age 17 based on AUA guidelines and his fairly good health He is agreeable this plan Plan for follow-up in 1 year with PSA/DRE - PSA; Future  2. Urinary urgency/ urinary dribbling Bladder scan unremarkable, no evidence of incomplete emptying Previously tried Toviaz by his former urologist without any change in his urinary urgency and dribbling Given samples of Myrbetriq 25 mg x 1 month today, advised to send [place] a MyChart message to let us know if this was effective or not We will manage adjusting this medication as needed His overall bother is minimal  Return in about 1 year (around 09/02/2019) for PSA/ DRE/ IPSS.  Hollice Espy, MD  [facility] [address] [place], [place] [address] [phone]

## 2018-09-02 NOTE — Patient Instructions (Signed)

## 2018-09-07 ENCOUNTER — Other Ambulatory Visit (HOSPITAL_COMMUNITY): Payer: Self-pay | Admitting: Internal Medicine

## 2018-10-11 ENCOUNTER — Other Ambulatory Visit: Payer: Self-pay | Admitting: Family Medicine

## 2018-10-12 ENCOUNTER — Other Ambulatory Visit: Payer: Self-pay

## 2018-10-12 DIAGNOSIS — G4700 Insomnia, unspecified: Secondary | ICD-10-CM

## 2018-10-12 MED ORDER — TRAZODONE HCL 50 MG PO TABS: 50.0000 mg | ORAL_TABLET | Freq: Every day | ORAL | 2 refills | Status: DC

## 2018-10-12 NOTE — Progress Notes (Unsigned)
Sent in rx for trazodone

## 2018-10-24 DIAGNOSIS — D485 Neoplasm of uncertain behavior of skin: Secondary | ICD-10-CM | POA: Diagnosis not present

## 2018-10-24 DIAGNOSIS — D23121 Other benign neoplasm of skin of left upper eyelid, including canthus: Secondary | ICD-10-CM | POA: Diagnosis not present

## 2018-12-29 ENCOUNTER — Other Ambulatory Visit (HOSPITAL_COMMUNITY): Payer: Self-pay | Admitting: Internal Medicine

## 2019-01-10 ENCOUNTER — Other Ambulatory Visit: Payer: Self-pay | Admitting: *Deleted

## 2019-01-10 ENCOUNTER — Other Ambulatory Visit: Payer: Self-pay

## 2019-01-10 DIAGNOSIS — G4700 Insomnia, unspecified: Secondary | ICD-10-CM

## 2019-01-10 DIAGNOSIS — R3915 Urgency of urination: Secondary | ICD-10-CM

## 2019-01-10 MED ORDER — TRAZODONE HCL 50 MG PO TABS: 50.0000 mg | ORAL_TABLET | Freq: Every day | ORAL | 0 refills | Status: DC

## 2019-01-13 ENCOUNTER — Other Ambulatory Visit
Admission: RE | Admit: 2019-01-13 | Discharge: 2019-01-13 | Disposition: A | Discharge status: Home / Self Care | Payer: Medicare Other | Attending: Urology | Admitting: Urology

## 2019-01-13 ENCOUNTER — Other Ambulatory Visit: Payer: Self-pay

## 2019-01-13 ENCOUNTER — Encounter: Payer: Self-pay | Admitting: Urology

## 2019-01-13 ENCOUNTER — Ambulatory Visit (INDEPENDENT_AMBULATORY_CARE_PROVIDER_SITE_OTHER): Payer: Medicare Other | Admitting: Urology

## 2019-01-13 VITALS — BP 129/76 | HR 75 | Ht 71.0 in | Wt 205.0 lb

## 2019-01-13 DIAGNOSIS — R3915 Urgency of urination: Secondary | ICD-10-CM

## 2019-01-13 LAB — URINALYSIS, COMPLETE (UACMP) WITH MICROSCOPIC
Bacteria, UA: NONE SEEN
Bilirubin Urine: NEGATIVE
Glucose, UA: NEGATIVE mg/dL
Hgb urine dipstick: NEGATIVE
Ketones, ur: NEGATIVE mg/dL
Leukocytes,Ua: NEGATIVE
Nitrite: NEGATIVE
Protein, ur: NEGATIVE mg/dL
RBC / HPF: NONE SEEN RBC/hpf (ref 0–5)
Specific Gravity, Urine: 1.01 (ref 1.005–1.030)
Squamous Epithelial / LPF: NONE SEEN (ref 0–5)
pH: 7 (ref 5.0–8.0)

## 2019-01-13 NOTE — Progress Notes (Signed)
01/13/2019 10:19 AM   David Mccullough May 23, 1946 [id number]  Referring provider: Juline Patch, MD [address] [place],  [place] [address]  Chief Complaint  Patient presents with  . Urinary Frequency   HPI: David Mccullough is a 73 y.o. male with PMH BPH who presents today with complaints of persistent urinary dribbling. He was last seen by Dr. Erlene Quan on 09/02/2018 with the same concerns. He was given a 56-month supply of Myrbetriq 25mg  samples.  Today, patient reports he took approximately 2 weeks of Myrbetriq and stopped the medication when he did not see symptom improvement. Patient additionally complains of urinary urgency and nocturia x4 at today's visit which is worse in the early morning. PVR in office 179mL.  PMH: Past Medical History:  Diagnosis Date  . CAD (coronary artery disease)   . Colon polyps   . Depression   . Elevated PSA   . Myocardial infarction ([place])    x 2  . Persistent atrial fibrillation   . Tachycardia     Surgical History: Past Surgical History:  Procedure Laterality Date  . CARDIAC ELECTROPHYSIOLOGY STUDY AND ABLATION     patient states due to V-tac  . CORONARY ARTERY BYPASS GRAFT N/A 03/22/2018   Procedure: CORONARY ARTERY BYPASS GRAFTING (CABG) x 3 WITH ENDOSCOPIC HARVESTING OF RIGHT GREATER SAPHENOUS VEIN;  Surgeon: Ivin Poot, MD;  Location: [place];  Service: Open Heart Surgery;  Laterality: N/A;  . CORONARY STENT INTERVENTION N/A 03/16/2018   Procedure: CORONARY STENT INTERVENTION;  Surgeon: Yolonda Kida, MD;  Location: [place] [facility];  Service: Cardiovascular;  Laterality: N/A;  . heart bypass    . LEFT ATRIAL APPENDAGE OCCLUSION N/A 03/22/2018   Procedure: LEFT ATRIAL APPENDAGE OCCLUSION USING 40 MM ATRICURE ATRICLIP;  Surgeon: Ivin Poot, MD;  Location: [place];  Service: Open Heart Surgery;  Laterality: N/A;  . LEFT HEART CATH AND CORONARY ANGIOGRAPHY N/A 03/15/2018   Procedure: LEFT HEART CATH AND CORONARY  ANGIOGRAPHY and PCI stent;  Surgeon: Yolonda Kida, MD;  Location: [place] [facility];  Service: Cardiovascular;  Laterality: N/A;  . TEE WITHOUT CARDIOVERSION N/A 03/22/2018   Procedure: TRANSESOPHAGEAL ECHOCARDIOGRAM (TEE);  Surgeon: Prescott Gum, Collier Salina, MD;  Location: [place];  Service: Open Heart Surgery;  Laterality: N/A;    Home Medications:  Allergies as of 01/13/2019   No Known Allergies     Medication List       Accurate as of January 13, 2019 10:19 AM. If you have any questions, ask your nurse or doctor.        atorvastatin 80 MG tablet Commonly known as: LIPITOR TAKE 1 TABLET(80 MG) BY MOUTH DAILY AT 6 PM   potassium chloride SA 20 MEQ tablet Commonly known as: K-DUR Take 1 tablet (20 mEq total) by mouth daily as needed. Only on days that you take a dose of lasix   traZODone 50 MG tablet Commonly known as: DESYREL Take 1 tablet (50 mg total) by mouth at bedtime.       Allergies: No Known Allergies  Family History: Family History  Problem Relation Age of Onset  . Heart disease Mother   . Heart disease Father   . Arrhythmia Sister   . Heart disease Sister     Social History:  reports that he quit smoking about 31 years ago. His smoking use included cigarettes. He has a 25.00 pack-year smoking history. He has never used smokeless tobacco. He reports previous alcohol use.  He reports previous drug use.  ROS: UROLOGY Frequent Urination?: Yes Hard to postpone urination?: No Burning/pain with urination?: No Get up at night to urinate?: No Leakage of urine?: No Urine stream starts and stops?: No Trouble starting stream?: No Do you have to strain to urinate?: No Blood in urine?: No Urinary tract infection?: No Sexually transmitted disease?: No Injury to kidneys or bladder?: No Painful intercourse?: No Weak stream?: No Erection problems?: No Penile pain?: No  Gastrointestinal Nausea?: No Vomiting?: No Indigestion/heartburn?: No Diarrhea?: No  Constipation?: No  Constitutional Fever: No Night sweats?: No Weight loss?: No Fatigue?: No  Skin Skin rash/lesions?: No Itching?: No  Eyes Blurred vision?: No Double vision?: No  Ears/Nose/Throat Sore throat?: No Sinus problems?: No  Hematologic/Lymphatic Swollen glands?: No Easy bruising?: No  Cardiovascular Leg swelling?: No Chest pain?: No  Respiratory Cough?: No Shortness of breath?: No  Endocrine Excessive thirst?: No  Musculoskeletal Back pain?: No Joint pain?: No  Neurological Headaches?: No Dizziness?: No  Psychologic Depression?: No Anxiety?: No  Physical Exam: BP 129/76   Pulse 75   Ht 5\' 11"  (1.803 m)   Wt 205 lb (93 kg)   BMI 28.59 kg/m   Constitutional:  Alert and oriented, No acute distress. HEENT: [employer] AT, moist mucus membranes.  Trachea midline, no masses. Cardiovascular: No clubbing, cyanosis, or edema. Respiratory: Normal respiratory effort, no increased work of breathing. Skin: No rashes, bruises or suspicious lesions. Neurologic: Grossly intact, no focal deficits, moving all 4 extremities. Psychiatric: Normal mood and affect.  Laboratory Data: PSA 08/02/2018: 3.7  Urinalysis    Component Value Date/Time   COLORURINE STRAW (A) 01/13/2019 0930   APPEARANCEUR CLEAR 01/13/2019 0930   LABSPEC 1.010 01/13/2019 0930   PHURINE 7.0 01/13/2019 0930   GLUCOSEU NEGATIVE 01/13/2019 0930   HGBUR NEGATIVE 01/13/2019 0930   BILIRUBINUR NEGATIVE 01/13/2019 0930   KETONESUR NEGATIVE 01/13/2019 0930   PROTEINUR NEGATIVE 01/13/2019 0930   NITRITE NEGATIVE 01/13/2019 0930   LEUKOCYTESUR NEGATIVE 01/13/2019 0930    Lab Results  Component Value Date   BACTERIA NONE SEEN 01/13/2019   Assessment & Plan:    1. Urinary urgency Patient reports a 2-week trial of Myrbetriq with no observed symptom improvement. Counseled patient that we would not expect his symptoms to improve until at least 4 consecutive weeks on this medication. Provided  patient with Myrbetriq 50mg  samples (1 month supply) to start now.   Given elevated PVR in office today of 150mL, some concern for incomplete bladder emptying that may be exacerbated by this medication.   Counseled patient that he will need to return to the office in 1 month for another PVR to monitor emptying status.   Counseled patient that further evaluation may include sleep study given nocturia symptoms and/or urodynamics. Will defer these at this time pending outcome of medication trial. - Bladder Scan (Post Void Residual) in office - 1 month trial of Myrbetriq  - Return in 1 month for PVR  Return in about 4 weeks (around 02/10/2019) for PVR .  Debroah Loop, PA-C  [facility] [address], [place], [place] [address] [phone]  Patient was seen and examined today in conjunction with Debroah Loop.  Agree with her above assessment and plan.  All of patients were questions/concerns were personally addressed today in clinic.  Hollice Espy, MD

## 2019-01-17 ENCOUNTER — Ambulatory Visit (INDEPENDENT_AMBULATORY_CARE_PROVIDER_SITE_OTHER): Payer: Medicare Other | Admitting: Family Medicine

## 2019-01-17 ENCOUNTER — Encounter: Payer: Self-pay | Admitting: Family Medicine

## 2019-01-17 ENCOUNTER — Other Ambulatory Visit: Payer: Self-pay

## 2019-01-17 VITALS — BP 120/62 | HR 80 | Ht 71.0 in | Wt 208.0 lb

## 2019-01-17 DIAGNOSIS — G4700 Insomnia, unspecified: Secondary | ICD-10-CM

## 2019-01-17 DIAGNOSIS — R7989 Other specified abnormal findings of blood chemistry: Secondary | ICD-10-CM | POA: Diagnosis not present

## 2019-01-17 MED ORDER — TRAZODONE HCL 50 MG PO TABS: 50.0000 mg | ORAL_TABLET | Freq: Every day | ORAL | 3 refills | Status: DC

## 2019-01-17 NOTE — Patient Instructions (Signed)

## 2019-01-17 NOTE — Progress Notes (Signed)
Date:  01/17/2019   Name:  David Mccullough   DOB:  1945/09/28   MRN:  [id number]   Chief Complaint: Insomnia (recheck TSH?)  Insomnia Primary symptoms: no fragmented sleep, no sleep disturbance, difficulty falling asleep, no somnolence, no frequent awakening, no premature morning awakening, no malaise/fatigue, no napping.   The current episode started more than one year. The problem has been gradually improving since onset. Past treatments include medication (trazadone). PMH includes: associated symptoms present, no hypertension, no depression, no family stress or anxiety, no restless leg syndrome, no work related stressors, no chronic pain, no apnea.   Thyroid Problem Presents for initial visit. Symptoms include heat intolerance. Patient reports no anxiety, cold intolerance, constipation, diaphoresis, diarrhea, dry skin, fatigue, hair loss, hoarse voice, nail problem, palpitations or weight gain.    Review of Systems  Constitutional: Negative for chills, diaphoresis, fatigue, fever, malaise/fatigue and weight gain.  HENT: Negative for drooling, ear discharge, ear pain, hoarse voice and sore throat.   Respiratory: Negative for apnea, cough, shortness of breath and wheezing.   Cardiovascular: Negative for chest pain, palpitations and leg swelling.  Gastrointestinal: Negative for abdominal pain, blood in stool, constipation, diarrhea and nausea.  Endocrine: Positive for heat intolerance. Negative for cold intolerance and polydipsia.  Genitourinary: Negative for dysuria, frequency, hematuria and urgency.  Musculoskeletal: Negative for back pain, myalgias and neck pain.  Skin: Negative for rash.  Allergic/Immunologic: Negative for environmental allergies.  Neurological: Negative for dizziness and headaches.  Hematological: Does not bruise/bleed easily.  Psychiatric/Behavioral: Negative for depression, sleep disturbance and suicidal ideas. The patient has insomnia. The patient is not  nervous/anxious.     Patient Active Problem List   Diagnosis Date Noted  . Elevated PSA 08/02/2018  . 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 NSTEMI (non-ST elevated myocardial infarction) ([facility]) 03/14/2018    No Known Allergies  Past Surgical History:  Procedure Laterality Date  . CARDIAC ELECTROPHYSIOLOGY STUDY AND ABLATION     patient states due to V-tac  . CORONARY ARTERY BYPASS GRAFT N/A 03/22/2018   Procedure: CORONARY ARTERY BYPASS GRAFTING (CABG) x 3 WITH ENDOSCOPIC HARVESTING OF RIGHT GREATER SAPHENOUS VEIN;  Surgeon: Ivin Poot, MD;  Location: [facility];  Service: Open Heart Surgery;  Laterality: N/A;  . CORONARY STENT INTERVENTION N/A 03/16/2018   Procedure: CORONARY STENT INTERVENTION;  Surgeon: Yolonda Kida, MD;  Location: [facility];  Service: Cardiovascular;  Laterality: N/A;  . heart bypass    . LEFT ATRIAL APPENDAGE OCCLUSION N/A 03/22/2018   Procedure: LEFT ATRIAL APPENDAGE OCCLUSION USING 40 MM ATRICURE ATRICLIP;  Surgeon: Ivin Poot, MD;  Location: [facility];  Service: Open Heart Surgery;  Laterality: N/A;  . LEFT HEART CATH AND CORONARY ANGIOGRAPHY N/A 03/15/2018   Procedure: LEFT HEART CATH AND CORONARY ANGIOGRAPHY and PCI stent;  Surgeon: Yolonda Kida, MD;  Location: [facility];  Service: Cardiovascular;  Laterality: N/A;  . TEE WITHOUT CARDIOVERSION N/A 03/22/2018   Procedure: TRANSESOPHAGEAL ECHOCARDIOGRAM (TEE);  Surgeon: Prescott Gum, Collier Salina, MD;  Location: [facility];  Service: Open Heart Surgery;  Laterality: N/A;    Social History   Tobacco Use  . Smoking status: Former Smoker    Packs/day: 1.00    Years: 25.00    Pack years: 25.00    Types: Cigarettes    Quit date: 06/30/1987    Years since quitting: 31.5  . Smokeless tobacco: Never Used  Substance Use Topics  . Alcohol use: Not Currently  .  Drug use: Not Currently     Medication list has been reviewed and updated.  Current Meds  Medication Sig  . atorvastatin (LIPITOR) 80 MG  tablet TAKE 1 TABLET(80 MG) BY MOUTH DAILY AT 6 PM  . mirabegron ER (MYRBETRIQ) 50 MG TB24 tablet Take 50 mg by mouth daily. urology  . traZODone (DESYREL) 50 MG tablet Take 1 tablet (50 mg total) by mouth at bedtime.  . [DISCONTINUED] potassium chloride SA (K-DUR,KLOR-CON) 20 MEQ tablet Take 1 tablet (20 mEq total) by mouth daily as needed. Only on days that you take a dose of lasix    PHQ 2/9 Scores 01/17/2019 06/23/2018 06/07/2018 05/02/2018  PHQ - 2 Score 0 1 2 3   PHQ- 9 Score 0 2 4 6     BP Readings from Last 3 Encounters:  01/17/19 120/62  01/13/19 129/76  09/02/18 119/67    Physical Exam Vitals signs and nursing note reviewed.  HENT:     Head: Normocephalic.     Right Ear: Tympanic membrane, ear canal and external ear normal.     Left Ear: Tympanic membrane, ear canal and external ear normal.     Nose: Nose normal.  Eyes:     General: No scleral icterus.       Right eye: No discharge.        Left eye: No discharge.     Conjunctiva/sclera: Conjunctivae normal.     Pupils: Pupils are equal, round, and reactive to light.  Neck:     Musculoskeletal: Normal range of motion and neck supple.     Thyroid: No thyromegaly.     Vascular: No JVD.     Trachea: No tracheal deviation.  Cardiovascular:     Rate and Rhythm: Normal rate and regular rhythm.     Heart sounds: Normal heart sounds. No murmur. No friction rub. No gallop.   Pulmonary:     Effort: No respiratory distress.     Breath sounds: Normal breath sounds. No wheezing or rales.  Abdominal:     General: Bowel sounds are normal.     Palpations: Abdomen is soft. There is no mass.     Tenderness: There is no abdominal tenderness. There is no guarding or rebound.  Musculoskeletal: Normal range of motion.        General: No tenderness.  Lymphadenopathy:     Cervical: No cervical adenopathy.  Skin:    General: Skin is warm.     Findings: No rash.  Neurological:     Mental Status: He is alert and oriented to person,  place, and time.     Cranial Nerves: No cranial nerve deficit.     Deep Tendon Reflexes: Reflexes are normal and symmetric.     Wt Readings from Last 3 Encounters:  01/17/19 208 lb (94.3 kg)  01/13/19 205 lb (93 kg)  09/02/18 205 lb (93 kg)    BP 120/62   Pulse 80   Ht 5\' 11"  (1.803 m)   Wt 208 lb (94.3 kg)   BMI 29.01 kg/m   Assessment and Plan:  1. Insomnia, unspecified type History of insomnia which is controlled on trazodone 50 mg 1 nightly for which we will continue at this time. - traZODone (DESYREL) 50 MG tablet; Take 1 tablet (50 mg total) by mouth at bedtime.  Dispense: 90 tablet; Refill: 3  2. Abnormal TSH On recheck of his labs on review was noted that TSH was elevated and has not been repeated or dealt with we will  repeat TSH with T4 T3 and adjust accordingly if necessary. - Thyroid Panel With TSH

## 2019-01-18 LAB — THYROID PANEL WITH TSH
Free Thyroxine Index: 1.9 (ref 1.2–4.9)
T3 Uptake Ratio: 29 % (ref 24–39)
T4, Total: 6.4 ug/dL (ref 4.5–12.0)
TSH: 2.36 u[IU]/mL (ref 0.450–4.500)

## 2019-02-13 ENCOUNTER — Other Ambulatory Visit: Payer: Self-pay | Admitting: Urology

## 2019-02-13 DIAGNOSIS — R3915 Urgency of urination: Secondary | ICD-10-CM

## 2019-02-13 MED ORDER — MIRABEGRON ER 50 MG PO TB24: 50.0000 mg | ORAL_TABLET | Freq: Every day | ORAL | 6 refills | Status: DC

## 2019-02-13 NOTE — Telephone Encounter (Signed)
Pt was given samples at last visit, pt states he is about out and would like Rx sent to [facility] in [place].  Please advise.

## 2019-02-13 NOTE — Telephone Encounter (Signed)
RX sent, pt to follow up in one month for PVR

## 2019-02-15 NOTE — Telephone Encounter (Signed)
Pt LMOM and states that his insurance would not cover his medication and would like a call back to see if it requires PA. He didn't say which medication.Please advise.

## 2019-02-17 NOTE — Telephone Encounter (Signed)
Pt called back and asked if her could come by and pick up samples of Myrbetriq 50mg . Please advise

## 2019-02-17 NOTE — Telephone Encounter (Signed)
Patient picked up samples

## 2019-02-21 ENCOUNTER — Telehealth: Payer: Self-pay

## 2019-02-21 NOTE — Telephone Encounter (Signed)
PA received from pharmacy for Myrbetriq 50mg . Contacted patient to see how well this was working for him and he states that they are working very well now. He states he has not tried and failed any other previous medications. Will start prior auth and contact patient with results

## 2019-02-22 DIAGNOSIS — Z951 Presence of aortocoronary bypass graft: Secondary | ICD-10-CM | POA: Diagnosis not present

## 2019-02-22 DIAGNOSIS — I2510 Atherosclerotic heart disease of native coronary artery without angina pectoris: Secondary | ICD-10-CM | POA: Diagnosis not present

## 2019-02-22 DIAGNOSIS — I10 Essential (primary) hypertension: Secondary | ICD-10-CM | POA: Diagnosis not present

## 2019-02-22 DIAGNOSIS — E782 Mixed hyperlipidemia: Secondary | ICD-10-CM | POA: Diagnosis not present

## 2019-02-22 DIAGNOSIS — I214 Non-ST elevation (NSTEMI) myocardial infarction: Secondary | ICD-10-CM | POA: Diagnosis not present

## 2019-02-22 DIAGNOSIS — I480 Paroxysmal atrial fibrillation: Secondary | ICD-10-CM | POA: Diagnosis not present

## 2019-02-28 DIAGNOSIS — H2513 Age-related nuclear cataract, bilateral: Secondary | ICD-10-CM | POA: Diagnosis not present

## 2019-03-01 MED ORDER — OXYBUTYNIN CHLORIDE ER 15 MG PO TB24: 15.0000 mg | ORAL_TABLET | Freq: Every day | ORAL | 6 refills | Status: DC

## 2019-03-01 NOTE — Telephone Encounter (Signed)
He has in fact tried and failed Toviaz.  Please see my previous documentation.  He can try oxybutynin 15 mg XL.  Please counsel him that this can cause dry eyes, dry mouth and constipation.  Hollice Espy, MD

## 2019-03-01 NOTE — Telephone Encounter (Signed)
Received Denial for PA Myrbetriq-preferred medications Oxybutynin or Trospium chloride. Please advise

## 2019-03-01 NOTE — Telephone Encounter (Signed)
Informed patient on possible side effects-verbalized understanding. He states he still has samples for Myrbetriq which he will finish completely before taking Oxybutynin. Sent to pharmacy.

## 2019-03-10 ENCOUNTER — Other Ambulatory Visit: Payer: Self-pay

## 2019-03-10 ENCOUNTER — Ambulatory Visit (INDEPENDENT_AMBULATORY_CARE_PROVIDER_SITE_OTHER): Payer: Medicare Other | Admitting: Urology

## 2019-03-10 ENCOUNTER — Encounter: Payer: Self-pay | Admitting: Urology

## 2019-03-10 VITALS — BP 124/69 | HR 76

## 2019-03-10 DIAGNOSIS — R3915 Urgency of urination: Secondary | ICD-10-CM | POA: Diagnosis not present

## 2019-03-10 LAB — BLADDER SCAN AMB NON-IMAGING

## 2019-03-10 NOTE — Progress Notes (Signed)
03/10/2019 9:28 AM   David Mccullough 02-Feb-1946 [id number]  Referring provider: Juline Patch, MD [address] [place],  [place] [address]  Chief Complaint  Patient presents with  . Urinary Frequency    4wk w/PVR    HPI: 73 year old male who presents today for follow-up.  He was last seen with urinary dribbling urgency frequency type symptoms.  He ultimately was given Myrbetriq 50 mg and had an excellent response to the medication.  His symptoms improved dramatically.  He continues to take this medication today as he still has samples.  His PVR is minimal (slightly elevated last time).  Unfortunately, his insurance denied Myrbetriq.  They like him to try oxybutynin first.  He has filled this prescription and has not yet taken his medication.  Results for orders placed or performed in visit on 03/10/19  BLADDER SCAN AMB NON-IMAGING  Result Value Ref Range   Scan Result 68ml      PMH: Past Medical History:  Diagnosis Date  . CAD (coronary artery disease)   . Colon polyps   . Depression   . Elevated PSA   . Myocardial infarction ([facility])    x 2  . Persistent atrial fibrillation   . Tachycardia     Surgical History: Past Surgical History:  Procedure Laterality Date  . CARDIAC ELECTROPHYSIOLOGY STUDY AND ABLATION     patient states due to V-tac  . CORONARY ARTERY BYPASS GRAFT N/A 03/22/2018   Procedure: CORONARY ARTERY BYPASS GRAFTING (CABG) x 3 WITH ENDOSCOPIC HARVESTING OF RIGHT GREATER SAPHENOUS VEIN;  Surgeon: Ivin Poot, MD;  Location: [facility];  Service: Open Heart Surgery;  Laterality: N/A;  . CORONARY STENT INTERVENTION N/A 03/16/2018   Procedure: CORONARY STENT INTERVENTION;  Surgeon: Yolonda Kida, MD;  Location: [facility];  Service: Cardiovascular;  Laterality: N/A;  . heart bypass    . LEFT ATRIAL APPENDAGE OCCLUSION N/A 03/22/2018   Procedure: LEFT ATRIAL APPENDAGE OCCLUSION USING 40 MM ATRICURE ATRICLIP;  Surgeon: Ivin Poot, MD;  Location: [facility];  Service: Open Heart Surgery;  Laterality: N/A;  . LEFT HEART CATH AND CORONARY ANGIOGRAPHY N/A 03/15/2018   Procedure: LEFT HEART CATH AND CORONARY ANGIOGRAPHY and PCI stent;  Surgeon: Yolonda Kida, MD;  Location: [facility];  Service: Cardiovascular;  Laterality: N/A;  . TEE WITHOUT CARDIOVERSION N/A 03/22/2018   Procedure: TRANSESOPHAGEAL ECHOCARDIOGRAM (TEE);  Surgeon: Prescott Gum, Collier Salina, MD;  Location: [facility];  Service: Open Heart Surgery;  Laterality: N/A;    Home Medications:  Allergies as of 03/10/2019   No Known Allergies     Medication List       Accurate as of March 10, 2019  9:28 AM. If you have any questions, ask your nurse or doctor.        atorvastatin 80 MG tablet Commonly known as: LIPITOR TAKE 1 TABLET(80 MG) BY MOUTH DAILY AT 6 PM   oxybutynin 15 MG 24 hr tablet Commonly known as: DITROPAN XL Take 1 tablet (15 mg total) by mouth daily.   traZODone 50 MG tablet Commonly known as: DESYREL Take 1 tablet (50 mg total) by mouth at bedtime.       Allergies: No Known Allergies  Family History: Family History  Problem Relation Age of Onset  . Heart disease Mother   . Heart disease Father   . Arrhythmia Sister   . Heart disease Sister     Social History:  reports that he quit  smoking about 31 years ago. His smoking use included cigarettes. He has a 25.00 pack-year smoking history. He has never used smokeless tobacco. He reports previous alcohol use. He reports previous drug use.  ROS: UROLOGY Frequent Urination?: No Hard to postpone urination?: No Burning/pain with urination?: No Get up at night to urinate?: No Leakage of urine?: No Urine stream starts and stops?: No Trouble starting stream?: No Do you have to strain to urinate?: No Blood in urine?: No Urinary tract infection?: No Sexually transmitted disease?: No Injury to kidneys or bladder?: No Painful intercourse?: No Weak stream?: No Erection  problems?: No Penile pain?: No  Gastrointestinal Nausea?: No Vomiting?: No Indigestion/heartburn?: No Diarrhea?: No Constipation?: No  Constitutional Fever: No Night sweats?: No Weight loss?: No Fatigue?: No  Skin Skin rash/lesions?: No Itching?: No  Eyes Blurred vision?: No Double vision?: No  Ears/Nose/Throat Sore throat?: No Sinus problems?: No  Hematologic/Lymphatic Swollen glands?: No Easy bruising?: No  Cardiovascular Leg swelling?: No Chest pain?: No  Respiratory Cough?: No Shortness of breath?: No  Endocrine Excessive thirst?: No  Musculoskeletal Back pain?: No Joint pain?: No  Neurological Headaches?: No Dizziness?: No  Psychologic Depression?: No Anxiety?: No  Physical Exam: BP 124/69   Pulse 76   Constitutional:  Alert and oriented, No acute distress. HEENT: [place] AT, moist mucus membranes.  Trachea midline, no masses. Cardiovascular: No clubbing, cyanosis, or edema. Respiratory: Normal respiratory effort, no increased work of breathing. GI: Abdomen is soft, nontender, nondistended, no abdominal masses Neurologic: Grossly intact, no focal deficits, moving all 4 extremities. Psychiatric: Normal mood and affect.  Laboratory Data: Lab Results  Component Value Date   WBC 13.7 (H) 03/30/2018   HGB 10.6 (L) 03/30/2018   HCT 32.9 (L) 03/30/2018   MCV 87.7 03/30/2018   PLT 414 (H) 03/30/2018    Lab Results  Component Value Date   CREATININE 1.25 (H) 04/25/2018    Lab Results  Component Value Date   HGBA1C 5.2 03/17/2018     Pertinent Imaging: Results for orders placed or performed in visit on 03/10/19  BLADDER SCAN AMB NON-IMAGING  Result Value Ref Range   Scan Result 41ml      Assessment & Plan:    1. Urinary urgency Did well with Myrbetriq 50 however his insurance would not cover this  We will plan to try oxybutynin 15 mg, he will call us after the month and let us know how it goes.  We discussed possible side  effects including dry eyes, dry mouth, constipation, and and possibly confusion.   - BLADDER SCAN AMB NON-IMAGING   F/u as previously scheduled in March  Hollice Espy, [place] [address], [place], [place] [address] [phone]

## 2019-03-15 ENCOUNTER — Other Ambulatory Visit: Payer: Medicare Other

## 2019-03-15 ENCOUNTER — Other Ambulatory Visit: Payer: Self-pay

## 2019-03-15 DIAGNOSIS — R69 Illness, unspecified: Secondary | ICD-10-CM | POA: Diagnosis not present

## 2019-03-15 DIAGNOSIS — E782 Mixed hyperlipidemia: Secondary | ICD-10-CM | POA: Diagnosis not present

## 2019-03-15 NOTE — Progress Notes (Signed)
Lipid and liver printed

## 2019-03-16 LAB — HEPATIC FUNCTION PANEL
ALT: 16 IU/L (ref 0–44)
AST: 18 IU/L (ref 0–40)
Albumin: 4.4 g/dL (ref 3.7–4.7)
Alkaline Phosphatase: 59 IU/L (ref 39–117)
Bilirubin Total: 0.6 mg/dL (ref 0.0–1.2)
Bilirubin, Direct: 0.13 mg/dL (ref 0.00–0.40)
Total Protein: 6.6 g/dL (ref 6.0–8.5)

## 2019-03-16 LAB — LIPID PANEL WITH LDL/HDL RATIO
Cholesterol, Total: 117 mg/dL (ref 100–199)
HDL: 36 mg/dL — ABNORMAL LOW (ref >39)
LDL Chol Calc (NIH): 62 mg/dL (ref 0–99)
LDL/HDL Ratio: 1.7 ratio (ref 0.0–3.6)
Triglycerides: 100 mg/dL (ref 0–149)
VLDL Cholesterol Cal: 19 mg/dL (ref 5–40)

## 2019-03-23 ENCOUNTER — Telehealth: Payer: Self-pay | Admitting: Urology

## 2019-03-23 NOTE — Telephone Encounter (Signed)
Called pt informed him that there are refills of the medication at his pharmacy. Pt gave verbal understanding.

## 2019-03-23 NOTE — Telephone Encounter (Signed)
Pt called and states that he was to call back to let us know if the Oxybutnin worked. He states that it is working.

## 2019-05-02 IMAGING — DX DG CHEST 1V PORT
1 series · 1 of 1 positions shown · non-contrast
Comparison: 03/24/2018

CLINICAL DATA: Follow-up chest tube placement

EXAM:
PORTABLE CHEST 1 VIEW

[chest]
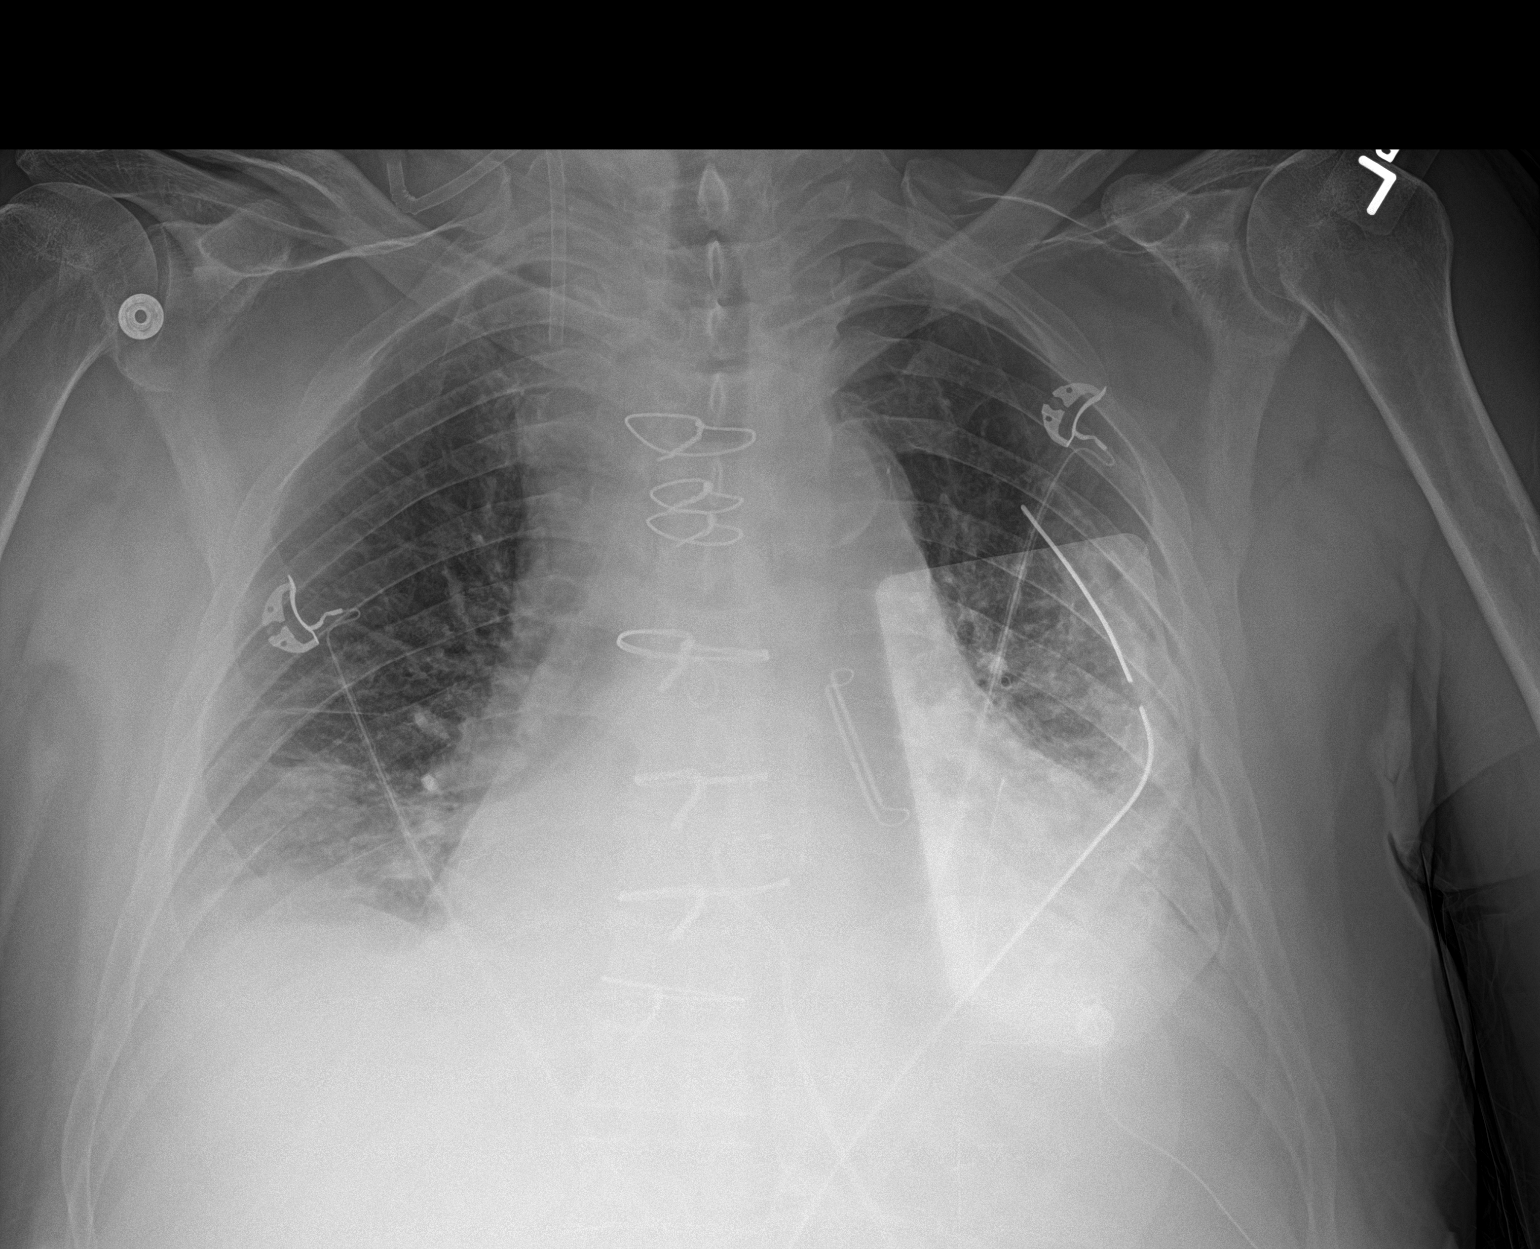

[1 of 1 positions shown; findings below may reference images not displayed]

FINDINGS: Cardiac shadow is enlarged but stable. Postsurgical changes are
again seen. Left thoracostomy catheter is again noted. No
pneumothorax is seen. Right jugular sheath is noted but kinked at
the skin surface stable from the prior exam. Bibasilar atelectatic
changes are again seen.
IMPRESSION: Overall stable appearance of the chest with bibasilar atelectasis.

## 2019-05-03 IMAGING — DX DG CHEST 1V PORT
1 series · 1 of 1 positions shown · non-contrast
Comparison: March 25, 2018

CLINICAL DATA: Central catheter placement

EXAM:
PORTABLE CHEST 1 VIEW

[chest]
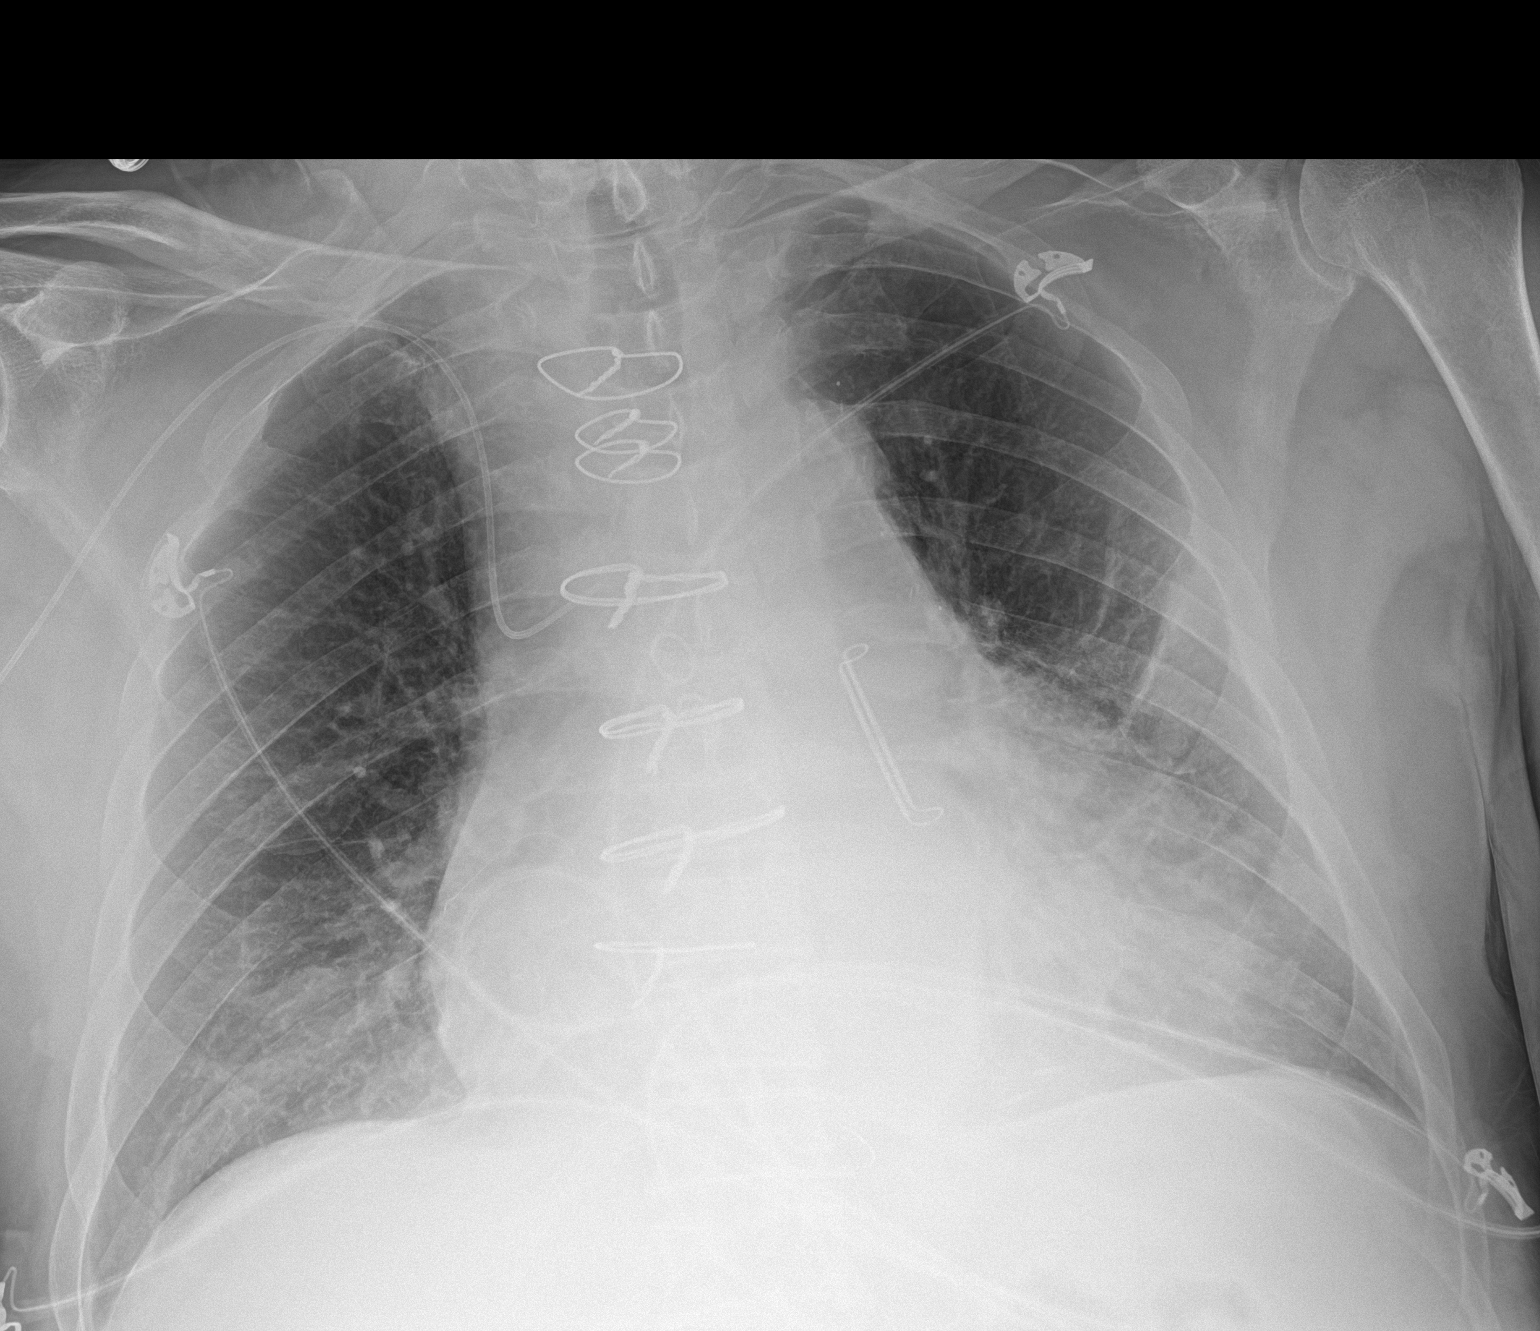

[1 of 1 positions shown; findings below may reference images not displayed]

FINDINGS: Cordis has been removed. A peripherally inserted right central
catheter has its tip in the azygos vein region. Chest tube on the
left has been removed. No evident pneumothorax.

There is atelectatic change in the left mid lung and both base
regions. There is no frank consolidation. There is stable
cardiomegaly with pulmonary vascularity normal. Patient is status
post coronary artery bypass grafting. There is a left atrial
appendage clamp present. There is aortic atherosclerosis. No bone
lesions.
IMPRESSION: New central catheter with tip in azygos vein. Interval removal of
left chest tube and right Cordis. No pneumothorax.

Stable cardiomegaly. Lower lobe and left midlung atelectatic change
present. No consolidation. There is aortic atherosclerosis.

Aortic Atherosclerosis ([id number]).

## 2019-05-04 IMAGING — DX DG CHEST 1V PORT
2 series · 2 of 2 positions shown · non-contrast
Comparison: 03/26/2018

CLINICAL DATA: CABG

EXAM:
PORTABLE CHEST 1 VIEW

[chest ap (1 of 2)]
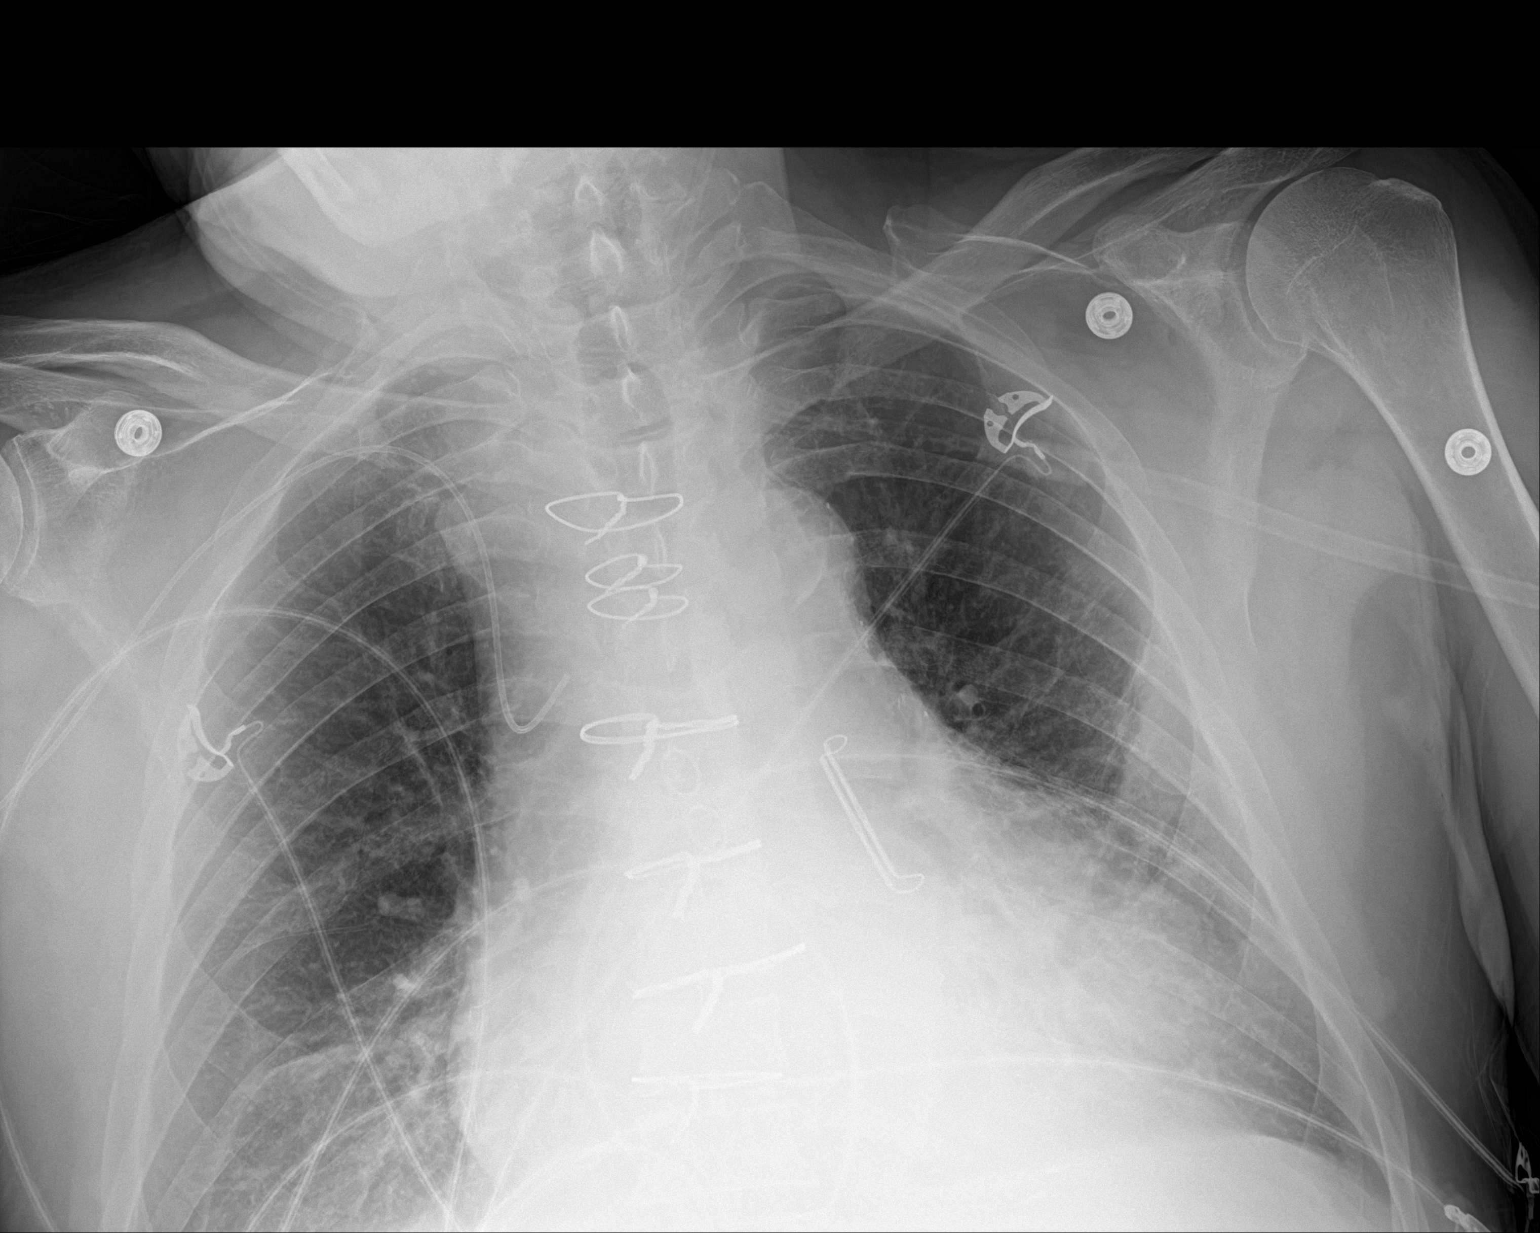

[chest ap (2 of 2)]
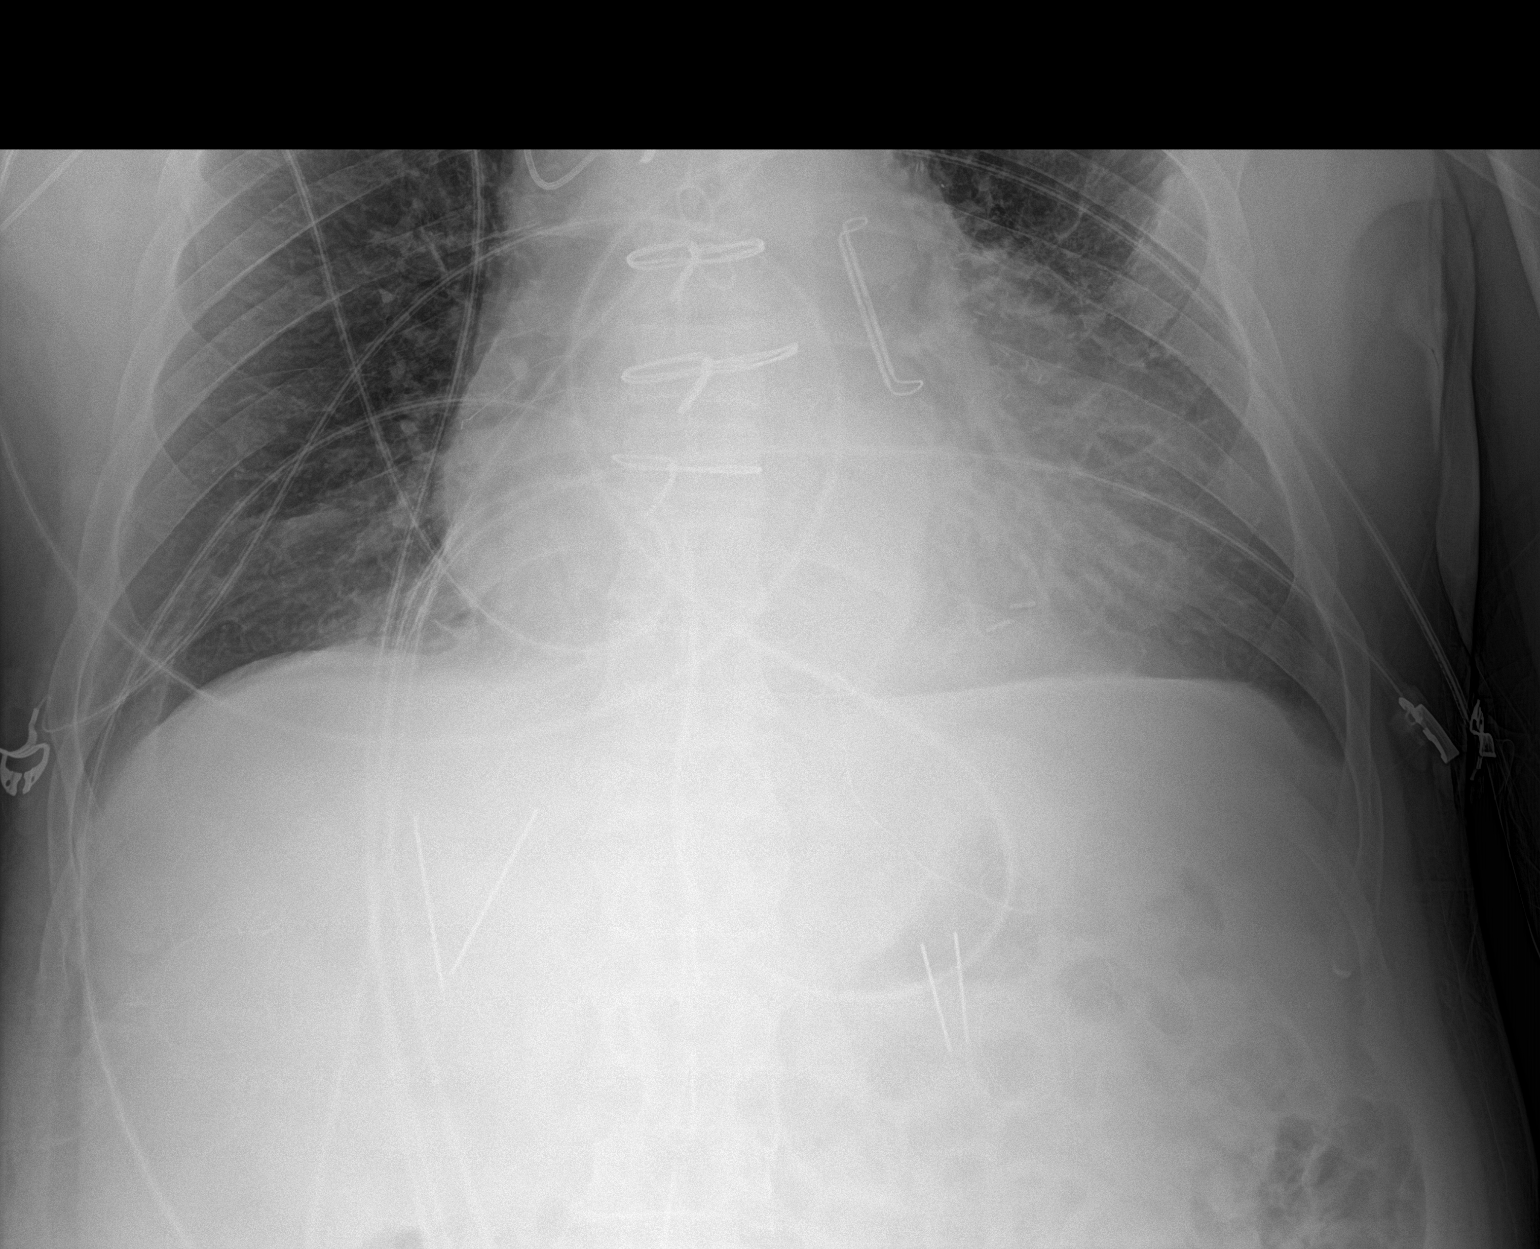

[2 of 2 positions shown; findings below may reference images not displayed]

FINDINGS: Sternotomy wires and atrial clip overlie normal cardiac silhouette.
PICC line with tip in the azygos vein versus brachiocephalic vein.
Mild bibasilar atelectasis. No pneumothorax.
IMPRESSION: 1. No interval change.
2. Mild bibasilar atelectasis.

## 2019-08-11 ENCOUNTER — Ambulatory Visit: Payer: Medicare Other | Attending: Internal Medicine

## 2019-08-11 DIAGNOSIS — Z23 Encounter for immunization: Secondary | ICD-10-CM

## 2019-08-11 NOTE — Progress Notes (Signed)
   Covid-19 Vaccination Clinic  Name:  David Mccullough    MRN: [id number] DOB: 1945-09-03  08/11/2019  Mr. David Mccullough was observed post Covid-19 immunization for 15 minutes without incidence. He was provided with Vaccine Information Sheet and instruction to access the V-Safe system.   Mr. David Mccullough was instructed to call 911 with any severe reactions post vaccine: Marland Kitchen Difficulty breathing  . Swelling of your face and throat  . A fast heartbeat  . A bad rash all over your body  . Dizziness and weakness    Immunizations Administered    Name Date Dose VIS Date Route   Pfizer COVID-19 Vaccine 08/11/2019  8:58 AM 0.3 mL 06/09/2019 Intramuscular   Manufacturer: [place]   Lot: [id number]   [place]: [id number]

## 2019-08-31 ENCOUNTER — Other Ambulatory Visit: Payer: Self-pay

## 2019-08-31 ENCOUNTER — Other Ambulatory Visit
Admission: RE | Admit: 2019-08-31 | Discharge: 2019-08-31 | Disposition: A | Discharge status: Home / Self Care | Payer: Medicare Other | Attending: Urology | Admitting: Urology

## 2019-08-31 DIAGNOSIS — R972 Elevated prostate specific antigen [PSA]: Secondary | ICD-10-CM | POA: Diagnosis not present

## 2019-08-31 LAB — PSA: Prostatic Specific Antigen: 3.96 ng/mL (ref 0.00–4.00)

## 2019-08-31 NOTE — Progress Notes (Signed)
09/01/2019 8:50 AM   [name] Jan 25, 1946 [id number]  Referring provider: Juline Patch, MD [address] [name],  [place] [address]  Chief Complaint  Patient presents with  . Urinary Urgency    1year    HPI: David Mccullough is a 74 yo M who presents today for a 1 year f/u for the evaluation and management of urinary frequency.  He had excellent reponse to Myrbetriq 50 mg when he was initially seen for urinary dribbling, urgency, and frequency type symptoms.  His insurance denied Myrbetriq, currently on oxybutinin 15 mg XL doing well.   He reports of little incontinence and denies urgency.  He also states that he has a good stream.   He denies dry mouth, constipation, confusion associated with oxybutynin and UTIs. He states most of the time he can empty his bladder. He also felt like he emptied his bladder today prior to bladder scan.  His PSA from 08/31/19 was 3.96. His previous PSA from 08/02/18 was 3.7.   PVR today 127 mL.   IPSS    Row Name 09/01/19 0800         International Prostate Symptom Score   How often have you had the sensation of not emptying your bladder?  Less than 1 in 5     How often have you had to urinate less than every two hours?  Less than 1 in 5 times     How often have you found you stopped and started again several times when you urinated?  Less than 1 in 5 times     How often have you found it difficult to postpone urination?  Not at All     How often have you had a weak urinary stream?  Less than 1 in 5 times     How often have you had to strain to start urination?  Not at All     How many times did you typically get up at night to urinate?  2 Times     Total IPSS Score  6       Quality of Life due to urinary symptoms   If you were to spend the rest of your life with your urinary condition just the way it is now how would you feel about that?  Mostly Satisfied        Score:  1-7 Mild 8-19 Moderate 20-35 Severe   PMH: Past  Medical History:  Diagnosis Date  . CAD (coronary artery disease)   . Colon polyps   . Depression   . Elevated PSA   . Myocardial infarction ([facility] [place])    x 2  . Persistent atrial fibrillation ([facility])   . Tachycardia     Surgical History: Past Surgical History:  Procedure Laterality Date  . CARDIAC ELECTROPHYSIOLOGY STUDY AND ABLATION     patient states due to V-tac  . CORONARY ARTERY BYPASS GRAFT N/A 03/22/2018   Procedure: CORONARY ARTERY BYPASS GRAFTING (CABG) x 3 WITH ENDOSCOPIC HARVESTING OF RIGHT GREATER SAPHENOUS VEIN;  Surgeon: Ivin Poot, MD;  Location: [facility];  Service: Open Heart Surgery;  Laterality: N/A;  . CORONARY STENT INTERVENTION N/A 03/16/2018   Procedure: CORONARY STENT INTERVENTION;  Surgeon: Yolonda Kida, MD;  Location: [facility];  Service: Cardiovascular;  Laterality: N/A;  . heart bypass    . LEFT ATRIAL APPENDAGE OCCLUSION N/A 03/22/2018   Procedure: LEFT ATRIAL APPENDAGE OCCLUSION USING 40 MM ATRICURE ATRICLIP;  Surgeon: Ivin Poot,  MD;  [facility];  Service: Open Heart Surgery;  Laterality: N/A;  . LEFT HEART CATH AND CORONARY ANGIOGRAPHY N/A 03/15/2018   Procedure: LEFT HEART CATH AND CORONARY ANGIOGRAPHY and PCI stent;  Surgeon: Yolonda Kida, MD;  Location: [facility];  Service: Cardiovascular;  Laterality: N/A;  . TEE WITHOUT CARDIOVERSION N/A 03/22/2018   Procedure: TRANSESOPHAGEAL ECHOCARDIOGRAM (TEE);  Surgeon: Prescott Gum, Collier Salina, MD;  Location: [facility];  Service: Open Heart Surgery;  Laterality: N/A;    Home Medications:  Allergies as of 09/01/2019   No Known Allergies     Medication List       Accurate as of September 01, 2019  8:50 AM. If you have any questions, ask your nurse or doctor.        STOP taking these medications   Myrbetriq 25 MG Tb24 tablet Generic drug: mirabegron ER Stopped by: Hollice Espy, MD     TAKE these medications   atorvastatin 80 MG tablet Commonly known as: LIPITOR TAKE 1  TABLET(80 MG) BY MOUTH DAILY AT 6 PM   oxybutynin 15 MG 24 hr tablet Commonly known as: DITROPAN XL Take 1 tablet (15 mg total) by mouth daily.   traZODone 50 MG tablet Commonly known as: DESYREL Take 1 tablet (50 mg total) by mouth at bedtime.       Allergies: No Known Allergies  Family History: Family History  Problem Relation Age of Onset  . Heart disease Mother   . Heart disease Father   . Arrhythmia Sister   . Heart disease Sister     Social History:  reports that he quit smoking about 32 years ago. His smoking use included cigarettes. He has a 25.00 pack-year smoking history. He has never used smokeless tobacco. He reports previous alcohol use. He reports previous drug use.   Physical Exam: BP (!) 148/75   Pulse 72   Ht 6' (1.829 m)   Wt 203 lb (92.1 kg)   BMI 27.53 kg/m   Constitutional:  Alert and oriented, No acute distress. HEENT: [place] AT, moist mucus membranes.  Trachea midline, no masses. Cardiovascular: No clubbing, cyanosis, or edema. Respiratory: Normal respiratory effort, no increased work of breathing. Skin: No rashes, bruises or suspicious lesions. Neurologic: Grossly intact, no focal deficits, moving all 4 extremities. Psychiatric: Normal mood and affect.  Pertinent Imaging: Results for orders placed or performed in visit on 09/01/19  BLADDER SCAN AMB NON-IMAGING  Result Value Ref Range   Scan Result 130ml     Assessment & Plan:    1.Urinary urgency  Pt has excellent response to oxybutynin  Continue oxybutynin Talked about side-efects including confusion and notified pt to call back if symptoms present   2. Incomplete bladder emptying  Borderline elevated PVR Will continue to monitor, no adverse effects including no infections  3. History of elevated PSA Recommend screening of PSA till age 71 annually PSA stable will discuss further next year Rectal exam deferred today given PSA stability  Return in about 1 year (around 08/31/2020) for  PVR/ IPSS/ PSA.  [facility] [address] [place], [place] [address] [phone]  I, Lucas Mallow, am acting as a scribe for Dr. Hollice Espy,  I have reviewed the above documentation for accuracy and completeness, and I agree with the above.   Hollice Espy, MD

## 2019-09-01 ENCOUNTER — Ambulatory Visit (INDEPENDENT_AMBULATORY_CARE_PROVIDER_SITE_OTHER): Payer: Medicare Other | Admitting: Urology

## 2019-09-01 ENCOUNTER — Encounter: Payer: Self-pay | Admitting: Urology

## 2019-09-01 VITALS — BP 148/75 | HR 72 | Ht 72.0 in | Wt 203.0 lb

## 2019-09-01 DIAGNOSIS — R339 Retention of urine, unspecified: Secondary | ICD-10-CM | POA: Diagnosis not present

## 2019-09-01 DIAGNOSIS — R972 Elevated prostate specific antigen [PSA]: Secondary | ICD-10-CM | POA: Diagnosis not present

## 2019-09-01 DIAGNOSIS — R3915 Urgency of urination: Secondary | ICD-10-CM

## 2019-09-01 LAB — BLADDER SCAN AMB NON-IMAGING

## 2019-09-06 ENCOUNTER — Ambulatory Visit: Payer: Medicare Other | Attending: Internal Medicine

## 2019-09-06 DIAGNOSIS — Z23 Encounter for immunization: Secondary | ICD-10-CM | POA: Insufficient documentation

## 2019-09-06 NOTE — Progress Notes (Signed)
   Covid-19 Vaccination Clinic  Name:  David Mccullough    MRN: [id number] DOB: 1945-08-08  09/06/2019  David Mccullough was observed post Covid-19 immunization for 15 minutes without incident. He was provided with Vaccine Information Sheet and instruction to access the V-Safe system.   David Mccullough was instructed to call 911 with any severe reactions post vaccine: Marland Kitchen Difficulty breathing  . Swelling of face and throat  . A fast heartbeat  . A bad rash all over body  . Dizziness and weakness   Immunizations Administered    Name Date Dose VIS Date Route   Pfizer COVID-19 Vaccine 09/06/2019  8:23 AM 0.3 mL 06/09/2019 Intramuscular   Manufacturer: [place]   Lot: [id number]   [place]: [id number]

## 2019-09-08 ENCOUNTER — Ambulatory Visit: Payer: Medicare Other | Admitting: Urology

## 2019-10-06 ENCOUNTER — Telehealth: Payer: Self-pay | Admitting: Family Medicine

## 2019-10-06 NOTE — Telephone Encounter (Signed)
Left message for patient to call back and schedule Medicare Annual Wellness Visit (AWV) either virtually/audio only or in office. Whichever the patients preference is.  No history of AWV; please schedule at anytime with [facility] Health Advisor.

## 2019-10-09 ENCOUNTER — Ambulatory Visit (INDEPENDENT_AMBULATORY_CARE_PROVIDER_SITE_OTHER): Payer: Medicare Other

## 2019-10-09 VITALS — Ht 72.0 in | Wt 201.0 lb

## 2019-10-09 DIAGNOSIS — Z87891 Personal history of nicotine dependence: Secondary | ICD-10-CM

## 2019-10-09 DIAGNOSIS — Z0000 Encounter for general adult medical examination without abnormal findings: Secondary | ICD-10-CM

## 2019-10-09 NOTE — Progress Notes (Signed)
Subjective:   David Mccullough is a 74 y.o. male who presents for Medicare Annual/Subsequent preventive examination.  Virtual Visit via Telephone Note  I connected with David Mccullough on 10/09/19 at  8:40 AM EDT by telephone and verified that I am speaking with the correct person using two identifiers.  Medicare Annual Wellness visit completed telephonically due to Covid-19 pandemic.   Location: Patient: home Provider: office   I discussed the limitations, risks, security and privacy concerns of performing an evaluation and management service by telephone and the availability of in person appointments. The patient expressed understanding and agreed to proceed.  Some vital signs may be absent or patient reported.   Clemetine Marker, LPN    Review of Systems:   Cardiac Risk Factors include: male gender;dyslipidemia     Objective:    Vitals: Ht 6' (1.829 m)   Wt 201 lb (91.2 kg)   BMI 27.26 kg/m   Body mass index is 27.26 kg/m.  Advanced Directives 10/09/2019 05/02/2018 03/16/2018 03/16/2018 03/15/2018 03/14/2018 03/14/2018  Does Patient Have a Medical Advance Directive? Yes Yes No No - No No  Type of Paramedic of [place];Living will [place]  Does patient want to make changes to medical advance directive? - No - Patient declined - - - - -  Copy of [place] in Chart? No - copy requested No - copy requested - - - - -  Would patient like information on creating a medical advance directive? - - No - Patient declined No - Patient declined No - Patient declined - -    Tobacco Social History   Tobacco Use  Smoking Status Former Smoker  . Packs/day: 1.00  . Years: 25.00  . Pack years: 25.00  . Types: Cigarettes  . Quit date: 06/30/1987  . Years since quitting: 32.2  Smokeless Tobacco Never Used     Counseling given: Not Answered   Clinical Intake:  Pre-visit preparation completed: Yes  Pain : No/denies  pain     BMI - recorded: 27.26 Nutritional Status: BMI 25 -29 Overweight Nutritional Risks: None Diabetes: No  How often do you need to have someone help you when you read instructions, pamphlets, or other written materials from your doctor or pharmacy?: 1 - Never  Interpreter Needed?: No  Information entered by :: Clemetine Marker LPN  Past Medical History:  Diagnosis Date  . CAD (coronary artery disease)   . Colon polyps   . Depression   . Elevated PSA   . Myocardial infarction ([place])    x 2  . Persistent atrial fibrillation ([place])   . Tachycardia    Past Surgical History:  Procedure Laterality Date  . CARDIAC ELECTROPHYSIOLOGY STUDY AND ABLATION     patient states due to V-tac  . CORONARY ARTERY BYPASS GRAFT N/A 03/22/2018   Procedure: CORONARY ARTERY BYPASS GRAFTING (CABG) x 3 WITH ENDOSCOPIC HARVESTING OF RIGHT GREATER SAPHENOUS VEIN;  Surgeon: Ivin Poot, MD;  Location: [facility];  Service: Open Heart Surgery;  Laterality: N/A;  . CORONARY STENT INTERVENTION N/A 03/16/2018   Procedure: CORONARY STENT INTERVENTION;  Surgeon: Yolonda Kida, MD;  Location: [place] [facility];  Service: Cardiovascular;  Laterality: N/A;  . heart bypass    . LEFT ATRIAL APPENDAGE OCCLUSION N/A 03/22/2018   Procedure: LEFT ATRIAL APPENDAGE OCCLUSION USING 40 MM ATRICURE ATRICLIP;  Surgeon: Ivin Poot, MD;  Location: [facility];  Service: Open Heart  Surgery;  Laterality: N/A;  . LEFT HEART CATH AND CORONARY ANGIOGRAPHY N/A 03/15/2018   Procedure: LEFT HEART CATH AND CORONARY ANGIOGRAPHY and PCI stent;  Surgeon: Yolonda Kida, MD;  Location: [facility];  Service: Cardiovascular;  Laterality: N/A;  . TEE WITHOUT CARDIOVERSION N/A 03/22/2018   Procedure: TRANSESOPHAGEAL ECHOCARDIOGRAM (TEE);  Surgeon: Prescott Gum, Collier Salina, MD;  Location: [facility];  Service: Open Heart Surgery;  Laterality: N/A;   Family History  Problem Relation Age of Onset  . Heart disease Mother   . Heart disease  Father   . Arrhythmia Sister   . Heart disease Sister    Social History   Socioeconomic History  . Marital status: Unknown    Spouse name: Not on file  . Number of children: Not on file  . Years of education: Not on file  . Highest education level: Not on file  Occupational History  . Not on file  Tobacco Use  . Smoking status: Former Smoker    Packs/day: 1.00    Years: 25.00    Pack years: 25.00    Types: Cigarettes    Quit date: 06/30/1987    Years since quitting: 32.2  . Smokeless tobacco: Never Used  Substance and Sexual Activity  . Alcohol use: Not Currently  . Drug use: Not Currently  . Sexual activity: Not Currently  Other Topics Concern  . Not on file  Social History Narrative  . Not on file   Social Determinants of Health   Financial Resource Strain: Low Risk   . Difficulty of Paying Living Expenses: Not hard at all  Food Insecurity: No Food Insecurity  . Worried About Charity fundraiser in the Last Year: Never true  . Ran Out of Food in the Last Year: Never true  Transportation Needs: No Transportation Needs  . Lack of Transportation (Medical): No  . Lack of Transportation (Non-Medical): No  Physical Activity: Insufficiently Active  . Days of Exercise per Week: 3 days  . Minutes of Exercise per Session: 30 min  Stress: No Stress Concern Present  . Feeling of Stress : Not at all  Social Connections: Unknown  . Frequency of Communication with Friends and Family: Patient refused  . Frequency of Social Gatherings with Friends and Family: Patient refused  . Attends Religious Services: Patient refused  . Active Member of Clubs or Organizations: Patient refused  . Attends Archivist Meetings: Patient refused  . Marital Status: Married    Outpatient Encounter Medications as of 10/09/2019  Medication Sig  . atorvastatin (LIPITOR) 80 MG tablet TAKE 1 TABLET(80 MG) BY MOUTH DAILY AT 6 PM  . oxybutynin (DITROPAN XL) 15 MG 24 hr tablet Take 1 tablet (15  mg total) by mouth daily.  . traZODone (DESYREL) 50 MG tablet Take 1 tablet (50 mg total) by mouth at bedtime.   No facility-administered encounter medications on file as of 10/09/2019.    Activities of Daily Living In your present state of health, do you have any difficulty performing the following activities: 10/09/2019  Hearing? N  Comment declines hearing aids  Vision? N  Difficulty concentrating or making decisions? N  Walking or climbing stairs? N  Dressing or bathing? N  Doing errands, shopping? N  Preparing Food and eating ? N  Using the Toilet? N  In the past six months, have you accidently leaked urine? N  Do you have problems with loss of bowel control? N  Managing your Medications? N  Managing your Finances? N  Housekeeping or managing your Housekeeping? N  Some recent data might be hidden    Patient Care Team: Juline Patch, MD as PCP - General (Family Medicine)   Assessment:   This is a routine wellness examination for [place].  Exercise Activities and Dietary recommendations Current Exercise Habits: Home exercise routine, Type of exercise: walking, Time (Minutes): 30, Frequency (Times/Week): 3, Weekly Exercise (Minutes/Week): 90, Intensity: Mild, Exercise limited by: None identified  Goals   None     Fall Risk Fall Risk  10/09/2019 05/02/2018 04/14/2018  Falls in the past year? 0 0 No  Number falls in past yr: 0 - -  Injury with Fall? 0 - -  Risk for fall due to : No Fall Risks - -  Follow up Falls prevention discussed - -   FALL RISK PREVENTION PERTAINING TO THE HOME:  Any stairs in or around the home? No  If so, do they handrails? No   Home free of loose throw rugs in walkways, pet beds, electrical cords, etc? Yes  Adequate lighting in your home to reduce risk of falls? Yes   ASSISTIVE DEVICES UTILIZED TO PREVENT FALLS:  Life alert? No  Use of a cane, walker or w/c? No  Grab bars in the bathroom? No  Shower chair or bench in shower? No    Elevated toilet seat or a handicapped toilet? No   DME ORDERS:  DME order needed?  No   TIMED UP AND GO:  Was the test performed? No . Telephonic visit.   Education: Fall risk prevention has been discussed.  Intervention(s) required? No   Depression Screen PHQ 2/9 Scores 10/09/2019 01/17/2019 06/23/2018 06/07/2018  PHQ - 2 Score 1 0 1 2  PHQ- 9 Score 2 0 2 4    Cognitive Function        Immunization History  Administered Date(s) Administered  . Influenza, High Dose Seasonal PF 04/14/2018  . Influenza-Unspecified 03/27/2019  . PFIZER SARS-COV-2 Vaccination 08/11/2019, 09/06/2019  . Pneumococcal Conjugate-13 02/05/2015    Qualifies for Shingles Vaccine? Yes . Due for Shingrix. Education has been provided regarding the importance of this vaccine. Pt has been advised to call insurance company to determine out of pocket expense. Advised may also receive vaccine at local pharmacy or Health Dept. Verbalized acceptance and understanding.  Tdap: Although this vaccine is not a covered service during a Wellness Exam, does the patient still wish to receive this vaccine today?  No .  Education has been provided regarding the importance of this vaccine. Advised may receive this vaccine at local pharmacy or Health Dept. Aware to provide a copy of the vaccination record if obtained from local pharmacy or Health Dept. Verbalized acceptance and understanding.  Flu Vaccine: Up to date  Pneumococcal Vaccine: Due for Pneumococcal vaccine. Does the patient want to receive this vaccine today?  No . Education has been provided regarding the importance of this vaccine but still declined. Advised may receive this vaccine at local pharmacy or Health Dept. Aware to provide a copy of the vaccination record if obtained from local pharmacy or Health Dept. Verbalized acceptance and understanding.   Screening Tests Health Maintenance  Topic Date Due  . Hepatitis C Screening  Never done  . TETANUS/TDAP   Never done  . PNA vac Low Risk Adult (2 of 2 - PPSV23) 02/05/2016  . INFLUENZA VACCINE  01/28/2020  . COLONOSCOPY  07/31/2024   Cancer Screenings:  Colorectal Screening: Completed 07/31/14. Repeat  every 10 years  Lung Cancer Screening: (Low Dose CT Chest recommended if Age 28-80 years, 30 pack-year currently smoking OR have quit w/in 15years.) does not qualify.   Additional Screening:  Hepatitis C Screening: does qualify; postponed  Vision Screening: Recommended annual ophthalmology exams for early detection of glaucoma and other disorders of the eye. Is the patient up to date with their annual eye exam?  Yes  Who is the provider or what is the name of the office in which the pt attends annual eye exams? Dr. Edison Pace  Dental Screening: Recommended annual dental exams for proper oral hygiene  Community Resource Referral:  CRR required this visit?  No       Plan:    I have personally reviewed and addressed the Medicare Annual Wellness questionnaire and have noted the following in the patient's chart:  A. Medical and social history B. Use of alcohol, tobacco or illicit drugs  C. Current medications and supplements D. Functional ability and status E.  Nutritional status F.  Physical activity G. Advance directives H. List of other physicians I.  Hospitalizations, surgeries, and ER visits in previous 12 months J.  [place] such as hearing and vision if needed, cognitive and depression L. Referrals and appointments   In addition, I have reviewed and discussed with patient certain preventive protocols, quality metrics, and best practice recommendations. A written personalized care plan for preventive services as well as general preventive health recommendations were provided to patient.   Signed,  Clemetine Marker, LPN Nurse Health Advisor   Nurse Notes: none

## 2019-10-09 NOTE — Patient Instructions (Signed)
David Mccullough , Thank you for taking time to come for your Medicare Wellness Visit. I appreciate your ongoing commitment to your health goals. Please review the following plan we discussed and let me know if I can assist you in the future.   Screening recommendations/referrals: Colonoscopy: done 07/31/14 Recommended yearly ophthalmology/optometry visit for glaucoma screening and checkup Recommended yearly dental visit for hygiene and checkup  Vaccinations: Influenza vaccine: done 03/27/19 Pneumococcal vaccine: done 02/05/15 Tdap vaccine: due Shingles vaccine: Shingrix discussed. Please contact your pharmacy for coverage information.  Covid-19: done 08/11/19 & 09/06/19  Advanced directives: Please bring a copy of your health care power of attorney and living will to the office at your convenience.  Conditions/risks identified: Keep up the great work!  Next appointment: Please follow up in one year for your Medicare Annual Wellness visit.    Preventive Care 73 Years and Older, Male Preventive care refers to lifestyle choices and visits with your health care provider that can promote health and wellness. What does preventive care include?  A yearly physical exam. This is also called an annual well check.  Dental exams once or twice a year.  Routine eye exams. Ask your health care provider how often you should have your eyes checked.  Personal lifestyle choices, including:  Daily care of your teeth and gums.  Regular physical activity.  Eating a healthy diet.  Avoiding tobacco and drug use.  Limiting alcohol use.  Practicing safe sex.  Taking low doses of aspirin every day.  Taking vitamin and mineral supplements as recommended by your health care provider. What happens during an annual well check? The services and screenings done by your health care provider during your annual well check will depend on your age, overall health, lifestyle risk factors, and family history of  disease. Counseling  Your health care provider may ask you questions about your:  Alcohol use.  Tobacco use.  Drug use.  Emotional well-being.  Home and relationship well-being.  Sexual activity.  Eating habits.  History of falls.  Memory and ability to understand (cognition).  Work and work Statistician. Screening  You may have the following tests or measurements:  Height, weight, and BMI.  Blood pressure.  Lipid and cholesterol levels. These may be checked every 5 years, or more frequently if you are over 33 years old.  Skin check.  Lung cancer screening. You may have this screening every year starting at age 91 if you have a 30-pack-year history of smoking and currently smoke or have quit within the past 15 years.  Fecal occult blood test (FOBT) of the stool. You may have this test every year starting at age 92.  Flexible sigmoidoscopy or colonoscopy. You may have a sigmoidoscopy every 5 years or a colonoscopy every 10 years starting at age 85.  Prostate cancer screening. Recommendations will vary depending on your family history and other risks.  Hepatitis C blood test.  Hepatitis B blood test.  Sexually transmitted disease (STD) testing.  Diabetes screening. This is done by checking your blood sugar (glucose) after you have not eaten for a while (fasting). You may have this done every 1-3 years.  Abdominal aortic aneurysm (AAA) screening. You may need this if you are a current or former smoker.  Osteoporosis. You may be screened starting at age 11 if you are at high risk. Talk with your health care provider about your test results, treatment options, and if necessary, the need for more tests. Vaccines  Your health care  provider may recommend certain vaccines, such as:  Influenza vaccine. This is recommended every year.  Tetanus, diphtheria, and acellular pertussis (Tdap, Td) vaccine. You may need a Td booster every 10 years.  Zoster vaccine. You may  need this after age 60.  Pneumococcal 13-valent conjugate (PCV13) vaccine. One dose is recommended after age 1.  Pneumococcal polysaccharide (PPSV23) vaccine. One dose is recommended after age 37. Talk to your health care provider about which screenings and vaccines you need and how often you need them. This information is not intended to replace advice given to you by your health care provider. Make sure you discuss any questions you have with your health care provider. Document Released: 07/12/2015 Document Revised: 03/04/2016 Document Reviewed: 04/16/2015 Elsevier Interactive Patient Education  2017 [place] Prevention in the Home Falls can cause injuries. They can happen to people of all ages. There are many things you can do to make your home safe and to help prevent falls. What can I do on the outside of my home?  Regularly fix the edges of walkways and driveways and fix any cracks.  Remove anything that might make you trip as you walk through a door, such as a raised step or threshold.  Trim any bushes or trees on the path to your home.  Use bright outdoor lighting.  Clear any walking paths of anything that might make someone trip, such as rocks or tools.  Regularly check to see if handrails are loose or broken. Make sure that both sides of any steps have handrails.  Any raised decks and porches should have guardrails on the edges.  Have any leaves, snow, or ice cleared regularly.  Use sand or salt on walking paths during winter.  Clean up any spills in your garage right away. This includes oil or grease spills. What can I do in the bathroom?  Use night lights.  Install grab bars by the toilet and in the tub and shower. Do not use towel bars as grab bars.  Use non-skid mats or decals in the tub or shower.  If you need to sit down in the shower, use a plastic, non-slip stool.  Keep the floor dry. Clean up any water that spills on the floor as soon as it  happens.  Remove soap buildup in the tub or shower regularly.  Attach bath mats securely with double-sided non-slip rug tape.  Do not have throw rugs and other things on the floor that can make you trip. What can I do in the bedroom?  Use night lights.  Make sure that you have a light by your bed that is easy to reach.  Do not use any sheets or blankets that are too big for your bed. They should not hang down onto the floor.  Have a firm chair that has side arms. You can use this for support while you get dressed.  Do not have throw rugs and other things on the floor that can make you trip. What can I do in the kitchen?  Clean up any spills right away.  Avoid walking on wet floors.  Keep items that you use a lot in easy-to-reach places.  If you need to reach something above you, use a strong step stool that has a grab bar.  Keep electrical cords out of the way.  Do not use floor polish or wax that makes floors slippery. If you must use wax, use non-skid floor wax.  Do not have throw  rugs and other things on the floor that can make you trip. What can I do with my stairs?  Do not leave any items on the stairs.  Make sure that there are handrails on both sides of the stairs and use them. Fix handrails that are broken or loose. Make sure that handrails are as long as the stairways.  Check any carpeting to make sure that it is firmly attached to the stairs. Fix any carpet that is loose or worn.  Avoid having throw rugs at the top or bottom of the stairs. If you do have throw rugs, attach them to the floor with carpet tape.  Make sure that you have a light switch at the top of the stairs and the bottom of the stairs. If you do not have them, ask someone to add them for you. What else can I do to help prevent falls?  Wear shoes that:  Do not have high heels.  Have rubber bottoms.  Are comfortable and fit you well.  Are closed at the toe. Do not wear sandals.  If you  use a stepladder:  Make sure that it is fully opened. Do not climb a closed stepladder.  Make sure that both sides of the stepladder are locked into place.  Ask someone to hold it for you, if possible.  Clearly mark and make sure that you can see:  Any grab bars or handrails.  First and last steps.  Where the edge of each step is.  Use tools that help you move around (mobility aids) if they are needed. These include:  Canes.  Walkers.  Scooters.  Crutches.  Turn on the lights when you go into a dark area. Replace any light bulbs as soon as they burn out.  Set up your furniture so you have a clear path. Avoid moving your furniture around.  If any of your floors are uneven, fix them.  If there are any pets around you, be aware of where they are.  Review your medicines with your doctor. Some medicines can make you feel dizzy. This can increase your chance of falling. Ask your doctor what other things that you can do to help prevent falls. This information is not intended to replace advice given to you by your health care provider. Make sure you discuss any questions you have with your health care provider. Document Released: 04/11/2009 Document Revised: 11/21/2015 Document Reviewed: 07/20/2014 Elsevier Interactive Patient Education  2017 [employer].

## 2019-10-15 ENCOUNTER — Other Ambulatory Visit: Payer: Self-pay | Admitting: Urology

## 2019-11-10 ENCOUNTER — Other Ambulatory Visit: Payer: Self-pay

## 2019-11-10 DIAGNOSIS — Z1211 Encounter for screening for malignant neoplasm of colon: Secondary | ICD-10-CM

## 2020-01-24 ENCOUNTER — Telehealth: Payer: Self-pay | Admitting: *Deleted

## 2020-01-24 ENCOUNTER — Other Ambulatory Visit: Payer: Self-pay | Admitting: Family Medicine

## 2020-01-24 DIAGNOSIS — G4700 Insomnia, unspecified: Secondary | ICD-10-CM

## 2020-01-24 MED ORDER — TRAZODONE HCL 50 MG PO TABS: 50.0000 mg | ORAL_TABLET | Freq: Every day | ORAL | 0 refills | Status: DC

## 2020-01-24 NOTE — Telephone Encounter (Signed)
Medication Refill - Medication: traZODone (DESYREL) 50 MG tablet    Preferred Pharmacy (with phone number or street name):  [id number] - [place], [place] [address] AT [address] Phone:  [phone]  Fax:  [phone]       Agent: Please be advised that RX refills may take up to 3 business days. We ask that you follow-up with your pharmacy.

## 2020-01-24 NOTE — Telephone Encounter (Signed)
I called pt regarding his 1 year check for refills on his medications.  I scheduled him with Dr. Otilio Miu on 01/29/2020 at 8:00 as an in office visit.   COVID-19 questionnaire completed.

## 2020-01-29 ENCOUNTER — Encounter: Payer: Self-pay | Admitting: Family Medicine

## 2020-01-29 ENCOUNTER — Other Ambulatory Visit: Payer: Self-pay

## 2020-01-29 ENCOUNTER — Ambulatory Visit (INDEPENDENT_AMBULATORY_CARE_PROVIDER_SITE_OTHER): Payer: Medicare Other | Admitting: Family Medicine

## 2020-01-29 VITALS — BP 110/60 | HR 72 | Ht 71.0 in | Wt 206.0 lb

## 2020-01-29 DIAGNOSIS — Z5320 Procedure and treatment not carried out because of patient's decision for unspecified reasons: Secondary | ICD-10-CM

## 2020-01-29 DIAGNOSIS — G4700 Insomnia, unspecified: Secondary | ICD-10-CM | POA: Diagnosis not present

## 2020-01-29 DIAGNOSIS — Z23 Encounter for immunization: Secondary | ICD-10-CM | POA: Diagnosis not present

## 2020-01-29 NOTE — Progress Notes (Signed)
Date:  01/29/2020   Name:  David Mccullough   DOB:  01/10/1946   MRN:  [id number]   Chief Complaint: Insomnia ([id number])  Insomnia Primary symptoms: difficulty falling asleep.  The onset quality is gradual. The problem occurs nightly. The problem has been gradually improving since onset. Past treatments include medication. The treatment provided moderate relief.    Lab Results  Component Value Date   CREATININE 1.25 (H) 04/25/2018   BUN 18 04/25/2018   NA 139 04/25/2018   K 4.2 04/25/2018   CL 108 04/25/2018   CO2 25 04/25/2018   Lab Results  Component Value Date   CHOL 117 03/15/2019   HDL 36 (L) 03/15/2019   LDLCALC 62 03/15/2019   TRIG 100 03/15/2019   CHOLHDL 4.4 03/17/2018   Lab Results  Component Value Date   TSH 2.360 01/17/2019   Lab Results  Component Value Date   HGBA1C 5.2 03/17/2018   Lab Results  Component Value Date   WBC 13.7 (H) 03/30/2018   HGB 10.6 (L) 03/30/2018   HCT 32.9 (L) 03/30/2018   MCV 87.7 03/30/2018   PLT 414 (H) 03/30/2018   Lab Results  Component Value Date   ALT 16 03/15/2019   AST 18 03/15/2019   ALKPHOS 59 03/15/2019   BILITOT 0.6 03/15/2019     Review of Systems  Constitutional: Negative for chills and fever.  HENT: Negative for drooling, ear discharge, ear pain and sore throat.   Respiratory: Negative for cough, shortness of breath and wheezing.   Cardiovascular: Negative for chest pain, palpitations and leg swelling.  Gastrointestinal: Negative for abdominal pain, blood in stool, constipation, diarrhea and nausea.  Endocrine: Negative for polydipsia.  Genitourinary: Negative for dysuria, frequency, hematuria and urgency.  Musculoskeletal: Negative for back pain, myalgias and neck pain.  Skin: Negative for rash.  Allergic/Immunologic: Negative for environmental allergies.  Neurological: Negative for dizziness and headaches.  Hematological: Does not bruise/bleed easily.  Psychiatric/Behavioral: Negative for suicidal  ideas. The patient has insomnia. The patient is not nervous/anxious.     Patient Active Problem List   Diagnosis Date Noted  . Elevated PSA 08/02/2018  . 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 NSTEMI (non-ST elevated myocardial infarction) ([place]) 03/14/2018    No Known Allergies  Past Surgical History:  Procedure Laterality Date  . CARDIAC ELECTROPHYSIOLOGY STUDY AND ABLATION     patient states due to V-tac  . CORONARY ARTERY BYPASS GRAFT N/A 03/22/2018   Procedure: CORONARY ARTERY BYPASS GRAFTING (CABG) x 3 WITH ENDOSCOPIC HARVESTING OF RIGHT GREATER SAPHENOUS VEIN;  Surgeon: Ivin Poot, MD;  Location: [place];  Service: Open Heart Surgery;  Laterality: N/A;  . CORONARY STENT INTERVENTION N/A 03/16/2018   Procedure: CORONARY STENT INTERVENTION;  Surgeon: Yolonda Kida, MD;  Location: [place] [facility];  Service: Cardiovascular;  Laterality: N/A;  . heart bypass    . LEFT ATRIAL APPENDAGE OCCLUSION N/A 03/22/2018   Procedure: LEFT ATRIAL APPENDAGE OCCLUSION USING 40 MM ATRICURE ATRICLIP;  Surgeon: Ivin Poot, MD;  Location: [place];  Service: Open Heart Surgery;  Laterality: N/A;  . LEFT HEART CATH AND CORONARY ANGIOGRAPHY N/A 03/15/2018   Procedure: LEFT HEART CATH AND CORONARY ANGIOGRAPHY and PCI stent;  Surgeon: Yolonda Kida, MD;  Location: [place] [facility];  Service: Cardiovascular;  Laterality: N/A;  . TEE WITHOUT CARDIOVERSION N/A 03/22/2018   Procedure: TRANSESOPHAGEAL ECHOCARDIOGRAM (TEE);  Surgeon: Prescott Gum, Collier Salina, MD;  Location: [place];  Service: Open  Heart Surgery;  Laterality: N/A;    Social History   Tobacco Use  . Smoking status: Former Smoker    Packs/day: 1.00    Years: 25.00    Pack years: 25.00    Types: Cigarettes    Quit date: 06/30/1987    Years since quitting: 32.6  . Smokeless tobacco: Never Used  Vaping Use  . Vaping Use: Never used  Substance Use Topics  . Alcohol use: Not Currently  . Drug use: Not Currently     Medication list has been  reviewed and updated.  Current Meds  Medication Sig  . atorvastatin (LIPITOR) 80 MG tablet TAKE 1 TABLET(80 MG) BY MOUTH DAILY AT 6 PM  . oxybutynin (DITROPAN XL) 15 MG 24 hr tablet TAKE 1 TABLET BY MOUTH DAILY  . traZODone (DESYREL) 50 MG tablet Take 1 tablet (50 mg total) by mouth at bedtime.    PHQ 2/9 Scores 10/09/2019 01/17/2019 06/23/2018 06/07/2018  PHQ - 2 Score 1 0 1 2  PHQ- 9 Score 2 0 2 4    No flowsheet data found.  BP Readings from Last 3 Encounters:  01/29/20 (!) 110/60  09/01/19 (!) 148/75  03/10/19 124/69    Physical Exam Vitals and nursing note reviewed.  HENT:     Head: Normocephalic.     Right Ear: External ear normal.     Left Ear: External ear normal.     Nose: Nose normal.  Eyes:     General: No scleral icterus.       Right eye: No discharge.        Left eye: No discharge.     Conjunctiva/sclera: Conjunctivae normal.     Pupils: Pupils are equal, round, and reactive to light.  Neck:     Thyroid: No thyromegaly.     Vascular: No JVD.     Trachea: No tracheal deviation.  Cardiovascular:     Rate and Rhythm: Normal rate and regular rhythm.     Heart sounds: Normal heart sounds. No murmur heard.  No friction rub. No gallop.   Pulmonary:     Effort: No respiratory distress.     Breath sounds: Normal breath sounds. No wheezing or rales.  Abdominal:     General: Bowel sounds are normal.     Palpations: Abdomen is soft. There is no mass.     Tenderness: There is no abdominal tenderness. There is no guarding or rebound.  Musculoskeletal:        General: No tenderness. Normal range of motion.     Cervical back: Normal range of motion and neck supple.  Lymphadenopathy:     Cervical: No cervical adenopathy.  Skin:    General: Skin is warm.     Findings: No rash.  Neurological:     Mental Status: He is alert and oriented to person, place, and time.     Cranial Nerves: No cranial nerve deficit.     Deep Tendon Reflexes: Reflexes are normal and  symmetric.     Wt Readings from Last 3 Encounters:  01/29/20 (!) 206 lb (93.4 kg)  10/09/19 201 lb (91.2 kg)  09/01/19 203 lb (92.1 kg)    BP (!) 110/60   Pulse 72   Ht 5\' 11"  (1.803 m)   Wt (!) 206 lb (93.4 kg)   SpO2 97%   BMI 28.73 kg/m   Assessment and Plan: 1. Insomnia, unspecified type Chronic.  Controlled.  Stable.  Patient will continue trazodone 50 mg 1/2-1 nightly  as needed for sleep.  Patient may return as needed.  2. Colonoscopy refused Patient did not respond to his referral for colonoscopy and states that it 74 he does not wish to take his screening for colonoscopy.  This was discussed and noted in chart.  3. Need for 23-polyvalent pneumococcal polysaccharide vaccine Discussed and administered - Pneumococcal polysaccharide vaccine 23-valent greater than or equal to 2yo subcutaneous/IM

## 2020-02-27 DIAGNOSIS — Z951 Presence of aortocoronary bypass graft: Secondary | ICD-10-CM | POA: Diagnosis not present

## 2020-02-27 DIAGNOSIS — E782 Mixed hyperlipidemia: Secondary | ICD-10-CM | POA: Diagnosis not present

## 2020-02-27 DIAGNOSIS — I10 Essential (primary) hypertension: Secondary | ICD-10-CM | POA: Diagnosis not present

## 2020-02-27 DIAGNOSIS — I214 Non-ST elevation (NSTEMI) myocardial infarction: Secondary | ICD-10-CM | POA: Diagnosis not present

## 2020-02-27 DIAGNOSIS — I480 Paroxysmal atrial fibrillation: Secondary | ICD-10-CM | POA: Diagnosis not present

## 2020-02-27 DIAGNOSIS — I2510 Atherosclerotic heart disease of native coronary artery without angina pectoris: Secondary | ICD-10-CM | POA: Diagnosis not present

## 2020-03-11 DIAGNOSIS — Z20822 Contact with and (suspected) exposure to covid-19: Secondary | ICD-10-CM | POA: Diagnosis not present

## 2020-04-16 ENCOUNTER — Ambulatory Visit: Payer: Medicare Other | Admitting: Family Medicine

## 2020-04-18 ENCOUNTER — Other Ambulatory Visit: Payer: Self-pay | Admitting: Family Medicine

## 2020-04-18 DIAGNOSIS — G4700 Insomnia, unspecified: Secondary | ICD-10-CM

## 2020-07-07 ENCOUNTER — Other Ambulatory Visit: Payer: Self-pay | Admitting: Family Medicine

## 2020-07-07 DIAGNOSIS — G4700 Insomnia, unspecified: Secondary | ICD-10-CM

## 2020-08-14 ENCOUNTER — Other Ambulatory Visit: Payer: Self-pay

## 2020-08-14 ENCOUNTER — Ambulatory Visit (INDEPENDENT_AMBULATORY_CARE_PROVIDER_SITE_OTHER): Payer: Medicare Other | Admitting: Family Medicine

## 2020-08-14 ENCOUNTER — Encounter: Payer: Self-pay | Admitting: Family Medicine

## 2020-08-14 DIAGNOSIS — G4700 Insomnia, unspecified: Secondary | ICD-10-CM

## 2020-08-14 MED ORDER — TRAZODONE HCL 50 MG PO TABS: ORAL_TABLET | ORAL | 2 refills | Status: DC

## 2020-08-14 NOTE — Patient Instructions (Signed)

## 2020-08-14 NOTE — Progress Notes (Signed)
Date:  08/14/2020   Name:  David Mccullough   DOB:  06/29/1946   MRN:  [id number]   Chief Complaint: Insomnia (Med refill) and Cough (Clear, thick production)  Insomnia Primary symptoms: no fragmented sleep, no sleep disturbance, no difficulty falling asleep, no somnolence, no frequent awakening, no premature morning awakening, no malaise/fatigue, no napping.   The current episode started more than one year. The onset quality is sudden. The problem occurs intermittently. The problem has been gradually improving since onset. The treatment provided moderate relief. PMH includes: no hypertension, no depression, no family stress or anxiety, no restless leg syndrome, no work related stressors, no chronic pain, no apnea.  Cough This is a new problem. The current episode started more than 1 month ago. The problem has been waxing and waning. The problem occurs hourly. The cough is non-productive. Associated symptoms include postnasal drip. Pertinent negatives include no chest pain, chills, ear congestion, ear pain, fever, headaches, heartburn, hemoptysis, myalgias, nasal congestion, rash, rhinorrhea, sore throat, shortness of breath, sweats, weight loss or wheezing. The symptoms are aggravated by pollens. He has tried nothing for the symptoms. There is no history of environmental allergies.    Lab Results  Component Value Date   CREATININE 1.25 (H) 04/25/2018   BUN 18 04/25/2018   NA 139 04/25/2018   K 4.2 04/25/2018   CL 108 04/25/2018   CO2 25 04/25/2018   Lab Results  Component Value Date   CHOL 117 03/15/2019   HDL 36 (L) 03/15/2019   LDLCALC 62 03/15/2019   TRIG 100 03/15/2019   CHOLHDL 4.4 03/17/2018   Lab Results  Component Value Date   TSH 2.360 01/17/2019   Lab Results  Component Value Date   HGBA1C 5.2 03/17/2018   Lab Results  Component Value Date   WBC 13.7 (H) 03/30/2018   HGB 10.6 (L) 03/30/2018   HCT 32.9 (L) 03/30/2018   MCV 87.7 03/30/2018   PLT 414 (H)  03/30/2018   Lab Results  Component Value Date   ALT 16 03/15/2019   AST 18 03/15/2019   ALKPHOS 59 03/15/2019   BILITOT 0.6 03/15/2019     Review of Systems  Constitutional: Negative for chills, fever, malaise/fatigue and weight loss.  HENT: Positive for postnasal drip. Negative for drooling, ear discharge, ear pain, rhinorrhea and sore throat.   Respiratory: Positive for cough. Negative for apnea, hemoptysis, shortness of breath and wheezing.   Cardiovascular: Negative for chest pain, palpitations and leg swelling.  Gastrointestinal: Negative for abdominal pain, blood in stool, constipation, diarrhea, heartburn and nausea.  Endocrine: Negative for polydipsia.  Genitourinary: Negative for dysuria, frequency, hematuria and urgency.  Musculoskeletal: Negative for back pain, myalgias and neck pain.  Skin: Negative for rash.  Allergic/Immunologic: Negative for environmental allergies.  Neurological: Negative for dizziness and headaches.  Hematological: Does not bruise/bleed easily.  Psychiatric/Behavioral: Negative for depression, sleep disturbance and suicidal ideas. The patient has insomnia. The patient is not nervous/anxious.     Patient Active Problem List   Diagnosis Date Noted  . Elevated PSA 08/02/2018  . 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 NSTEMI (non-ST elevated myocardial infarction) ([place]) 03/14/2018    No Known Allergies  Past Surgical History:  Procedure Laterality Date  . CARDIAC ELECTROPHYSIOLOGY STUDY AND ABLATION     patient states due to V-tac  . CORONARY ARTERY BYPASS GRAFT N/A 03/22/2018   Procedure: CORONARY ARTERY BYPASS GRAFTING (CABG) x 3 WITH ENDOSCOPIC HARVESTING OF RIGHT GREATER SAPHENOUS VEIN;  Surgeon: Prescott Gum, Collier Salina, MD;  Location: [place];  Service: Open Heart Surgery;  Laterality: N/A;  . CORONARY STENT INTERVENTION N/A 03/16/2018   Procedure: CORONARY STENT INTERVENTION;  Surgeon: Yolonda Kida, MD;  Location: [place] [facility];  Service:  Cardiovascular;  Laterality: N/A;  . heart bypass    . LEFT ATRIAL APPENDAGE OCCLUSION N/A 03/22/2018   Procedure: LEFT ATRIAL APPENDAGE OCCLUSION USING 40 MM ATRICURE ATRICLIP;  Surgeon: Ivin Poot, MD;  Location: [place];  Service: Open Heart Surgery;  Laterality: N/A;  . LEFT HEART CATH AND CORONARY ANGIOGRAPHY N/A 03/15/2018   Procedure: LEFT HEART CATH AND CORONARY ANGIOGRAPHY and PCI stent;  Surgeon: Yolonda Kida, MD;  Location: [place] [facility];  Service: Cardiovascular;  Laterality: N/A;  . TEE WITHOUT CARDIOVERSION N/A 03/22/2018   Procedure: TRANSESOPHAGEAL ECHOCARDIOGRAM (TEE);  Surgeon: Prescott Gum, Collier Salina, MD;  Location: [place];  Service: Open Heart Surgery;  Laterality: N/A;    Social History   Tobacco Use  . Smoking status: Former Smoker    Packs/day: 1.00    Years: 25.00    Pack years: 25.00    Types: Cigarettes    Quit date: 06/30/1987    Years since quitting: 33.1  . Smokeless tobacco: Never Used  Vaping Use  . Vaping Use: Never used  Substance Use Topics  . Alcohol use: Not Currently  . Drug use: Not Currently     Medication list has been reviewed and updated.  Current Meds  Medication Sig  . atorvastatin (LIPITOR) 80 MG tablet TAKE 1 TABLET(80 MG) BY MOUTH DAILY AT 6 PM  . oxybutynin (DITROPAN XL) 15 MG 24 hr tablet TAKE 1 TABLET BY MOUTH DAILY  . traZODone (DESYREL) 50 MG tablet TAKE 1 TABLET(50 MG) BY MOUTH AT BEDTIME    PHQ 2/9 Scores 08/14/2020 10/09/2019 01/17/2019 06/23/2018  PHQ - 2 Score 1 1 0 1  PHQ- 9 Score 2 2 0 2    GAD 7 : Generalized Anxiety Score 08/14/2020  Nervous, Anxious, on Edge 0  Control/stop worrying 0  Worry too much - different things 2  Trouble relaxing 0  Restless 0  Easily annoyed or irritable 0  Afraid - awful might happen 0  Total GAD 7 Score 2  Anxiety Difficulty Not difficult at all    BP Readings from Last 3 Encounters:  08/14/20 120/80  01/29/20 (!) 110/60  09/01/19 (!) 148/75    Physical Exam Vitals  and nursing note reviewed.  HENT:     Head: Normocephalic.     Right Ear: Tympanic membrane, ear canal and external ear normal.     Left Ear: Tympanic membrane, ear canal and external ear normal.     Nose: Nose normal. No congestion or rhinorrhea.     Mouth/Throat:     Mouth: Oropharynx is clear and moist.  Eyes:     General: No scleral icterus.       Right eye: No discharge.        Left eye: No discharge.     Extraocular Movements: EOM normal.     Conjunctiva/sclera: Conjunctivae normal.     Pupils: Pupils are equal, round, and reactive to light.  Neck:     Thyroid: No thyromegaly.     Vascular: No JVD.     Trachea: No tracheal deviation.  Cardiovascular:     Rate and Rhythm: Normal rate and regular rhythm.     Pulses: Intact distal pulses.     Heart  sounds: Normal heart sounds. No murmur heard. No friction rub. No gallop.   Pulmonary:     Effort: No respiratory distress.     Breath sounds: Normal breath sounds. No wheezing, rhonchi or rales.  Chest:     Chest wall: No tenderness.  Abdominal:     General: Bowel sounds are normal.     Palpations: Abdomen is soft. There is no hepatosplenomegaly or mass.     Tenderness: There is no abdominal tenderness. There is no CVA tenderness, guarding or rebound.  Musculoskeletal:        General: No tenderness or edema. Normal range of motion.     Cervical back: Normal range of motion and neck supple.  Lymphadenopathy:     Cervical: No cervical adenopathy.  Skin:    General: Skin is warm.     Findings: No rash.  Neurological:     Mental Status: He is alert and oriented to person, place, and time.     Cranial Nerves: No cranial nerve deficit.     Deep Tendon Reflexes: Strength normal and reflexes are normal and symmetric.     Wt Readings from Last 3 Encounters:  08/14/20 208 lb (94.3 kg)  01/29/20 (!) 206 lb (93.4 kg)  10/09/19 201 lb (91.2 kg)    BP 120/80   Pulse 80   Ht 5\' 11"  (1.803 m)   Wt 208 lb (94.3 kg)   BMI 29.01  kg/m   Assessment and Plan: 1. Insomnia, unspecified type Chronic.  Controlled.  Stable.  Patient is having good initiation of sleep but does wake up to go to the bathroom at times.  He is not too unstable with sedation that that he is at risk.  We will continue trazodone 50 mg nightly. - traZODone (DESYREL) 50 MG tablet; TAKE 1 TABLET(50 MG) BY MOUTH AT BEDTIME  Dispense: 90 tablet; Refill: 2  Patient has had a cough which I think is probably allergy related with some postnasal drainage.  We will suggest Mucinex DM tablets on a as needed basis.

## 2020-08-30 ENCOUNTER — Ambulatory Visit: Payer: Medicare Other | Admitting: Urology

## 2020-09-06 ENCOUNTER — Other Ambulatory Visit
Admission: RE | Admit: 2020-09-06 | Discharge: 2020-09-06 | Disposition: A | Discharge status: Home / Self Care | Payer: Medicare Other | Attending: Urology | Admitting: Urology

## 2020-09-06 ENCOUNTER — Encounter: Payer: Self-pay | Admitting: Urology

## 2020-09-06 ENCOUNTER — Other Ambulatory Visit: Payer: Self-pay

## 2020-09-06 ENCOUNTER — Ambulatory Visit (INDEPENDENT_AMBULATORY_CARE_PROVIDER_SITE_OTHER): Payer: Medicare Other | Admitting: Urology

## 2020-09-06 VITALS — BP 133/71 | HR 75 | Ht 72.0 in | Wt 200.0 lb

## 2020-09-06 DIAGNOSIS — R3915 Urgency of urination: Secondary | ICD-10-CM | POA: Insufficient documentation

## 2020-09-06 DIAGNOSIS — R339 Retention of urine, unspecified: Secondary | ICD-10-CM | POA: Diagnosis not present

## 2020-09-06 DIAGNOSIS — R3129 Other microscopic hematuria: Secondary | ICD-10-CM

## 2020-09-06 LAB — URINALYSIS, COMPLETE (UACMP) WITH MICROSCOPIC
Bilirubin Urine: NEGATIVE
Glucose, UA: NEGATIVE mg/dL
Ketones, ur: NEGATIVE mg/dL
Leukocytes,Ua: NEGATIVE
Nitrite: NEGATIVE
Protein, ur: NEGATIVE mg/dL
Specific Gravity, Urine: 1.01 (ref 1.005–1.030)
pH: 7 (ref 5.0–8.0)

## 2020-09-06 LAB — BLADDER SCAN AMB NON-IMAGING

## 2020-09-06 MED ORDER — GEMTESA 75 MG PO TABS: 75.0000 mg | ORAL_TABLET | Freq: Every day | ORAL | 0 refills | Status: DC

## 2020-09-06 NOTE — Progress Notes (Signed)
09/06/2020 9:18 AM   David Mccullough 08-04-1945 [id number]  Referring provider: Juline Patch, MD [address] [place],  [place] [address]  Chief Complaint  Patient presents with  . Urinary Urgency    1 year follow up w/IPSS PVR    HPI: 75 year old male with urinary urgency frequency who returns today for annual follow-up.  He has been taking oxybutynin 15 mg XL daily for the past year.  He previously did well with Myrbetriq but this was not covered by his insurance.  He has been out of medication for the last week and cannot tell the difference.  He continues to have some urgency frequency and occasional urinary leakage which is bothersome to him.  He denies any obstructive urinary symptoms.  IPSS as below.  No dysuria or gross hematuria.  PVR today is minimal.   IPSS    Row Name 09/06/20 0900         International Prostate Symptom Score   How often have you had the sensation of not emptying your bladder? Less than half the time     How often have you had to urinate less than every two hours? Less than half the time     How often have you found you stopped and started again several times when you urinated? Less than 1 in 5 times     How often have you found it difficult to postpone urination? About half the time     How often have you had a weak urinary stream? Less than half the time     How often have you had to strain to start urination? Less than 1 in 5 times     How many times did you typically get up at night to urinate? 2 Times     Total IPSS Score 13           Quality of Life due to urinary symptoms   If you were to spend the rest of your life with your urinary condition just the way it is now how would you feel about that? Mixed            Score:  1-7 Mild 8-19 Moderate 20-35 Severe   Results for orders placed or performed in visit on 09/06/20  BLADDER SCAN AMB NON-IMAGING  Result Value Ref Range   Scan Result 53ml      PMH: Past  Medical History:  Diagnosis Date  . CAD (coronary artery disease)   . Colon polyps   . Depression   . Elevated PSA   . Myocardial infarction ([place])    x 2  . Persistent atrial fibrillation ([place])   . Tachycardia     Surgical History: Past Surgical History:  Procedure Laterality Date  . CARDIAC ELECTROPHYSIOLOGY STUDY AND ABLATION     patient states due to V-tac  . CORONARY ARTERY BYPASS GRAFT N/A 03/22/2018   Procedure: CORONARY ARTERY BYPASS GRAFTING (CABG) x 3 WITH ENDOSCOPIC HARVESTING OF RIGHT GREATER SAPHENOUS VEIN;  Surgeon: Ivin Poot, MD;  Location: [place];  Service: Open Heart Surgery;  Laterality: N/A;  . CORONARY STENT INTERVENTION N/A 03/16/2018   Procedure: CORONARY STENT INTERVENTION;  Surgeon: Yolonda Kida, MD;  Location: [place] [facility];  Service: Cardiovascular;  Laterality: N/A;  . heart bypass    . LEFT ATRIAL APPENDAGE OCCLUSION N/A 03/22/2018   Procedure: LEFT ATRIAL APPENDAGE OCCLUSION USING 40 MM ATRICURE ATRICLIP;  Surgeon: Ivin Poot, MD;  Location: [facility];  Service: Open Heart Surgery;  Laterality: N/A;  . LEFT HEART CATH AND CORONARY ANGIOGRAPHY N/A 03/15/2018   Procedure: LEFT HEART CATH AND CORONARY ANGIOGRAPHY and PCI stent;  Surgeon: Yolonda Kida, MD;  Location: [place] [facility];  Service: Cardiovascular;  Laterality: N/A;  . TEE WITHOUT CARDIOVERSION N/A 03/22/2018   Procedure: TRANSESOPHAGEAL ECHOCARDIOGRAM (TEE);  Surgeon: Prescott Gum, Collier Salina, MD;  Location: [place];  Service: Open Heart Surgery;  Laterality: N/A;    Home Medications:  Allergies as of 09/06/2020   No Known Allergies     Medication List       Accurate as of September 06, 2020  9:18 AM. If you have any questions, ask your nurse or doctor.        atorvastatin 80 MG tablet Commonly known as: LIPITOR TAKE 1 TABLET(80 MG) BY MOUTH DAILY AT 6 PM   oxybutynin 15 MG 24 hr tablet Commonly known as: DITROPAN XL TAKE 1 TABLET BY MOUTH DAILY   traZODone 50 MG  tablet Commonly known as: DESYREL TAKE 1 TABLET(50 MG) BY MOUTH AT BEDTIME       Allergies: No Known Allergies  Family History: Family History  Problem Relation Age of Onset  . Heart disease Mother   . Heart disease Father   . Arrhythmia Sister   . Heart disease Sister     Social History:  reports that he quit smoking about 33 years ago. His smoking use included cigarettes. He has a 25.00 pack-year smoking history. He has never used smokeless tobacco. He reports previous alcohol use. He reports previous drug use.   Physical Exam: BP 133/71   Pulse 75   Ht 6' (1.829 m)   Wt 200 lb (90.7 kg)   BMI 27.12 kg/m   Constitutional:  Alert and oriented, No acute distress. HEENT: [place] AT, moist mucus membranes.  Trachea midline, no masses. Cardiovascular: No clubbing, cyanosis, or edema. Respiratory: Normal respiratory effort, no increased work of breathing. Skin: No rashes, bruises or suspicious lesions. Neurologic: Grossly intact, no focal deficits, moving all 4 extremities. Psychiatric: Normal mood and affect.  Laboratory Data: Lab Results  Component Value Date   WBC 13.7 (H) 03/30/2018   HGB 10.6 (L) 03/30/2018   HCT 32.9 (L) 03/30/2018   MCV 87.7 03/30/2018   PLT 414 (H) 03/30/2018    Lab Results  Component Value Date   CREATININE 1.25 (H) 04/25/2018     Lab Results  Component Value Date   HGBA1C 5.2 03/17/2018    Urinalysis Component     Latest Ref Rng & Units 09/06/2020          Color, Urine     YELLOW YELLOW  Appearance     CLEAR CLEAR  Specific Gravity, Urine     1.005 - 1.030 1.010  pH     5.0 - 8.0 7.0  Glucose, UA     NEGATIVE mg/dL NEGATIVE  Hgb urine dipstick     NEGATIVE TRACE (A)  Bilirubin Urine     NEGATIVE NEGATIVE  Ketones, ur     NEGATIVE mg/dL NEGATIVE  Protein     NEGATIVE mg/dL NEGATIVE  Nitrite     NEGATIVE NEGATIVE  Leukocytes,Ua     NEGATIVE NEGATIVE  Squamous Epithelial / LPF     0 - 5 0-5  WBC, UA     0 - 5 WBC/hpf  0-5  RBC / HPF     0 - 5 RBC/hpf 6-10  Bacteria, UA  NONE SEEN MANY (A)      Assessment & Plan:    1. Urinary urgency Worsening urinary urgency frequency with occasional urge incontinence  We discussed changing medications today, previously did well with Myrbetriq but this was not affordable.  We will try Gemtesa for which he was given a month of samples today.  Have him call let us know if this is effective.  We also went ahead and discussed some second line therapies today.  - BLADDER SCAN AMB NON-IMAGING - Vibegron (GEMTESA) 75 MG TABS; Take 75 mg by mouth daily.  Dispense: 30 tablet; Refill: 0 - Urinalysis, Complete w Microscopic; Future - Urine Culture; Future   2. Microscopic hematuria Microscopic hematuria today which has not been previously appreciated  Many bacteria in the specimen as well, will send a urine culture to rule out infection, if there is no infection, may need hematuria evaluation.  We will repeat urinalysis in a month.  I called him and left him a message regarding this plan as he left a urine on his way out of clinic today.   Hollice Espy, MD  [facility] [address] [place], [place] [address] [phone]

## 2020-09-08 LAB — URINE CULTURE: Culture: NO GROWTH

## 2020-09-24 ENCOUNTER — Telehealth: Payer: Self-pay

## 2020-09-24 DIAGNOSIS — R3915 Urgency of urination: Secondary | ICD-10-CM

## 2020-09-24 MED ORDER — GEMTESA 75 MG PO TABS: 75.0000 mg | ORAL_TABLET | Freq: Every day | ORAL | 11 refills | Status: DC

## 2020-09-24 NOTE — Telephone Encounter (Signed)
Pt LVM on triage line stating that he was given samples of Gemtesa and would like to proceed with getting a prescription.  RX sent.

## 2020-09-25 ENCOUNTER — Telehealth: Payer: Self-pay

## 2020-09-25 ENCOUNTER — Other Ambulatory Visit: Payer: Self-pay

## 2020-09-25 DIAGNOSIS — R3915 Urgency of urination: Secondary | ICD-10-CM

## 2020-09-25 MED ORDER — GEMTESA 75 MG PO TABS: 75.0000 mg | ORAL_TABLET | Freq: Every day | ORAL | 11 refills | Status: DC

## 2020-09-25 NOTE — Telephone Encounter (Signed)
Started PA for gemteza. Pt wife aware

## 2020-10-03 ENCOUNTER — Other Ambulatory Visit: Payer: Self-pay | Admitting: Family Medicine

## 2020-10-03 DIAGNOSIS — G4700 Insomnia, unspecified: Secondary | ICD-10-CM

## 2020-10-09 ENCOUNTER — Ambulatory Visit (INDEPENDENT_AMBULATORY_CARE_PROVIDER_SITE_OTHER): Payer: Medicare Other

## 2020-10-09 DIAGNOSIS — Z0000 Encounter for general adult medical examination without abnormal findings: Secondary | ICD-10-CM

## 2020-10-09 NOTE — Progress Notes (Signed)
Subjective:   David Mccullough is a 75 y.o. male who presents for Medicare Annual/Subsequent preventive examination.  Virtual Visit via Telephone Note  I connected with  David Mccullough on 10/09/20 at  8:40 AM EDT by telephone and verified that I am speaking with the correct person using two identifiers.  Location: Patient: home Provider: [facility] Persons participating in the virtual visit: [name]   I discussed the limitations, risks, security and privacy concerns of performing an evaluation and management service by telephone and the availability of in person appointments. The patient expressed understanding and agreed to proceed.  Interactive audio and video telecommunications were attempted between this nurse and patient, however failed, due to patient having technical difficulties OR patient did not have access to video capability.  We continued and completed visit with audio only.  Some vital signs may be absent or patient reported.   Clemetine Marker, LPN   Review of Systems     Cardiac Risk Factors include: advanced age (>59men, >27 women);dyslipidemia;male gender     Objective:    There were no vitals filed for this visit. There is no height or weight on file to calculate BMI.  Advanced Directives 10/09/2020 10/09/2019 05/02/2018 03/16/2018 03/16/2018 03/15/2018 03/14/2018  Does Patient Have a Medical Advance Directive? Yes Yes Yes No No - No  Type of Paramedic of [place];Living will [place];Living will [place]  Does patient want to make changes to medical advance directive? - - No - Patient declined - - - -  Copy of [place] in Chart? No - copy requested No - copy requested No - copy requested - - - -  Would patient like information on creating a medical advance directive? - - - No - Patient declined No - Patient declined No - Patient declined -    Current Medications  (verified) Outpatient Encounter Medications as of 10/09/2020  Medication Sig  . atorvastatin (LIPITOR) 80 MG tablet TAKE 1 TABLET(80 MG) BY MOUTH DAILY AT 6 PM  . traZODone (DESYREL) 50 MG tablet TAKE 1 TABLET(50 MG) BY MOUTH AT BEDTIME  . Vibegron (GEMTESA) 75 MG TABS Take 75 mg by mouth daily.   No facility-administered encounter medications on file as of 10/09/2020.    Allergies (verified) Patient has no known allergies.   History: Past Medical History:  Diagnosis Date  . CAD (coronary artery disease)   . Colon polyps   . Depression   . Elevated PSA   . Hyperlipidemia   . Myocardial infarction ([place])    x 2  . Persistent atrial fibrillation ([place])   . Tachycardia    Past Surgical History:  Procedure Laterality Date  . CARDIAC ELECTROPHYSIOLOGY STUDY AND ABLATION     patient states due to V-tac  . CORONARY ARTERY BYPASS GRAFT N/A 03/22/2018   Procedure: CORONARY ARTERY BYPASS GRAFTING (CABG) x 3 WITH ENDOSCOPIC HARVESTING OF RIGHT GREATER SAPHENOUS VEIN;  Surgeon: Ivin Poot, MD;  Location: [place];  Service: Open Heart Surgery;  Laterality: N/A;  . CORONARY STENT INTERVENTION N/A 03/16/2018   Procedure: CORONARY STENT INTERVENTION;  Surgeon: Yolonda Kida, MD;  Location: [place] [facility];  Service: Cardiovascular;  Laterality: N/A;  . heart bypass    . LEFT ATRIAL APPENDAGE OCCLUSION N/A 03/22/2018   Procedure: LEFT ATRIAL APPENDAGE OCCLUSION USING 40 MM ATRICURE ATRICLIP;  Surgeon: Ivin Poot, MD;  Location: [place];  Service: Open  Heart Surgery;  Laterality: N/A;  . LEFT HEART CATH AND CORONARY ANGIOGRAPHY N/A 03/15/2018   Procedure: LEFT HEART CATH AND CORONARY ANGIOGRAPHY and PCI stent;  Surgeon: Yolonda Kida, MD;  Location: [place] [facility];  Service: Cardiovascular;  Laterality: N/A;  . TEE WITHOUT CARDIOVERSION N/A 03/22/2018   Procedure: TRANSESOPHAGEAL ECHOCARDIOGRAM (TEE);  Surgeon: Prescott Gum, Collier Salina, MD;  Location: [place];  Service: Open Heart  Surgery;  Laterality: N/A;   Family History  Problem Relation Age of Onset  . Heart disease Mother   . Heart disease Father   . Arrhythmia Sister   . Heart disease Sister    Social History   Socioeconomic History  . Marital status: Married    Spouse name: Not on file  . Number of children: 3  . Years of education: Not on file  . Highest education level: Not on file  Occupational History  . Not on file  Tobacco Use  . Smoking status: Former Smoker    Packs/day: 1.00    Years: 25.00    Pack years: 25.00    Types: Cigarettes    Quit date: 06/30/1987    Years since quitting: 33.3  . Smokeless tobacco: Never Used  Vaping Use  . Vaping Use: Never used  Substance and Sexual Activity  . Alcohol use: Not Currently  . Drug use: Not Currently  . Sexual activity: Not Currently  Other Topics Concern  . Not on file  Social History Narrative  . Not on file   Social Determinants of Health   Financial Resource Strain: Low Risk   . Difficulty of Paying Living Expenses: Not hard at all  Food Insecurity: No Food Insecurity  . Worried About Charity fundraiser in the Last Year: Never true  . Ran Out of Food in the Last Year: Never true  Transportation Needs: No Transportation Needs  . Lack of Transportation (Medical): No  . Lack of Transportation (Non-Medical): No  Physical Activity: Inactive  . Days of Exercise per Week: 0 days  . Minutes of Exercise per Session: 0 min  Stress: No Stress Concern Present  . Feeling of Stress : Only a little  Social Connections: Moderately Integrated  . Frequency of Communication with Friends and Family: More than three times a week  . Frequency of Social Gatherings with Friends and Family: More than three times a week  . Attends Religious Services: More than 4 times per year  . Active Member of Clubs or Organizations: No  . Attends Archivist Meetings: Never  . Marital Status: Married    Tobacco Counseling Counseling given: Not  Answered   Clinical Intake:  Pre-visit preparation completed: Yes  Pain : No/denies pain     Nutritional Risks: None Diabetes: No  How often do you need to have someone help you when you read instructions, pamphlets, or other written materials from your doctor or pharmacy?: 1 - Never    Interpreter Needed?: No  Information entered by :: Clemetine Marker LPN   Activities of Daily Living In your present state of health, do you have any difficulty performing the following activities: 10/09/2020  Hearing? N  Comment declines hearing aids  Vision? N  Difficulty concentrating or making decisions? N  Walking or climbing stairs? N  Dressing or bathing? N  Doing errands, shopping? N  Preparing Food and eating ? N  Using the Toilet? N  In the past six months, have you accidently leaked urine? Y  Do  you have problems with loss of bowel control? N  Managing your Medications? N  Managing your Finances? N  Housekeeping or managing your Housekeeping? N  Some recent data might be hidden    Patient Care Team: Juline Patch, MD as PCP - General (Family Medicine) Hollice Espy, MD as Consulting Physician (Urology) Yolonda Kida, MD as Consulting Physician (Cardiology)  Indicate any recent Medical Services you may have received from other than [facility] providers in the past year (date may be approximate).     Assessment:   This is a routine wellness examination for [place].  Hearing/Vision screen  Hearing Screening   125Hz  250Hz  500Hz  1000Hz  2000Hz  3000Hz  4000Hz  6000Hz  8000Hz   Right ear:           Left ear:           Comments: Pt denies hearing difficulty  Vision Screening Comments: Annual vision screen[facility]  Dietary issues and exercise activities discussed: Current Exercise Habits: The patient has a physically strenuous job, but has no regular exercise apart from work., Exercise limited by: respiratory conditions(s)  Goals    .  Increase physical activity     Recommend increasing physical activity to at least 3 days per week      Depression Screen PHQ 2/9 Scores 10/09/2020 08/14/2020 10/09/2019 01/17/2019 06/23/2018 06/07/2018 05/02/2018  PHQ - 2 Score 1 1 1  0 1 2 3   PHQ- 9 Score 1 2 2  0 2 4 6     Fall Risk Fall Risk  10/09/2020 08/14/2020 01/29/2020 10/09/2019 05/02/2018  Falls in the past year? 0 0 0 0 0  Number falls in past yr: 0 - - 0 -  Injury with Fall? 0 - - 0 -  Risk for fall due to : No Fall Risks - No Fall Risks No Fall Risks -  Follow up Falls prevention discussed Falls evaluation completed Falls evaluation completed Falls prevention discussed -    FALL RISK PREVENTION PERTAINING TO THE HOME:  Any stairs in or around the home? No  If so, are there any without handrails? No  Home free of loose throw rugs in walkways, pet beds, electrical cords, etc? Yes  Adequate lighting in your home to reduce risk of falls? Yes   ASSISTIVE DEVICES UTILIZED TO PREVENT FALLS:  Life alert? No  Use of a cane, walker or w/c? No  Grab bars in the bathroom? No  Shower chair or bench in shower? No  Elevated toilet seat or a handicapped toilet? No   TIMED UP AND GO:  Was the test performed? No . Telephonic visit.    Cognitive Function: Normal cognitive status assessed by direct observation by this Nurse Health Advisor. No abnormalities found.          Immunizations Immunization History  Administered Date(s) Administered  . Influenza, High Dose Seasonal PF 04/14/2018  . Influenza-Unspecified 03/27/2019  . PFIZER(Purple Top)SARS-COV-2 Vaccination 08/11/2019, 09/06/2019, 03/29/2020  . Pneumococcal Conjugate-13 02/05/2015  . Pneumococcal Polysaccharide-23 01/29/2020    TDAP status: Due, Education has been provided regarding the importance of this vaccine. Advised may receive this vaccine at local pharmacy or Health Dept. Aware to provide a copy of the vaccination record if obtained from local pharmacy or Health  Dept. Verbalized acceptance and understanding.  Flu Vaccine status: Up to date - need record from [facility].   Pneumococcal vaccine status: Up to date  Covid-19 vaccine status: Completed vaccines  Qualifies for Shingles Vaccine? Yes  Zostavax completed No   Shingrix Completed?: No.    Education has been provided regarding the importance of this vaccine. Patient has been advised to call insurance company to determine out of pocket expense if they have not yet received this vaccine. Advised may also receive vaccine at local pharmacy or Health Dept. Verbalized acceptance and understanding.  Screening Tests Health Maintenance  Topic Date Due  . TETANUS/TDAP  01/28/2021 (Originally 12/18/1964)  . Hepatitis C Screening  01/28/2021 (Originally 05-02-1946)  . INFLUENZA VACCINE  01/27/2021  . COLONOSCOPY (Pts 45-19yrs Insurance coverage will need to be confirmed)  07/31/2024  . COVID-19 Vaccine  Completed  . PNA vac Low Risk Adult  Completed  . HPV VACCINES  Aged Out    Health Maintenance  There are no preventive care reminders to display for this patient.  Colorectal cancer screening: Type of screening: Colonoscopy. Completed 07/31/14. Repeat every 10 years  Lung Cancer Screening: (Low Dose CT Chest recommended if Age 3-80 years, 30 pack-year currently smoking OR have quit w/in 15years.) does not qualify.   Additional Screening:  Hepatitis C Screening: does qualify; postponed  Vision Screening: Recommended annual ophthalmology exams for early detection of glaucoma and other disorders of the eye. Is the patient up to date with their annual eye exam?  Yes  Who is the provider or what is the name of the office in which the patient attends annual eye exams? [place] [facility] Screening: Recommended annual dental exams for proper oral hygiene  Community Resource Referral / Chronic Care Management: CRR required this visit?  No   CCM required this visit?  No      Plan:      I have personally reviewed and noted the following in the patient's chart:   . Medical and social history . Use of alcohol, tobacco or illicit drugs  . Current medications and supplements . Functional ability and status . Nutritional status . Physical activity . Advanced directives . List of other physicians . Hospitalizations, surgeries, and ER visits in previous 12 months . Vitals . Screenings to include cognitive, depression, and falls . Referrals and appointments  In addition, I have reviewed and discussed with patient certain preventive protocols, quality metrics, and best practice recommendations. A written personalized care plan for preventive services as well as general preventive health recommendations were provided to patient.     Clemetine Marker, LPN   [id number]/57/[id number]   Nurse Notes: none

## 2020-10-09 NOTE — Patient Instructions (Signed)
David Mccullough , Thank you for taking time to come for your Medicare Wellness Visit. I appreciate your ongoing commitment to your health goals. Please review the following plan we discussed and let me know if I can assist you in the future.   Screening recommendations/referrals: Colonoscopy: done 07/31/14. Repeat in 2026 Recommended yearly ophthalmology/optometry visit for glaucoma screening and checkup Recommended yearly dental visit for hygiene and checkup  Vaccinations: Influenza vaccine: done at [facility] - please provide record if available Pneumococcal vaccine: done 01/29/20 Tdap vaccine: due Shingles vaccine: Shingrix discussed. Please contact your pharmacy for coverage information.  Covid-19: done 08/11/19, 09/06/19 & 03/29/20  Advanced directives: Please bring a copy of your health care power of attorney and living will to the office at your convenience.  Conditions/risks identified: Recommend increasing physical activity to at least 3 days per week   Next appointment: Follow up in one year for your annual wellness visit.   Preventive Care 2 Years and Older, Male Preventive care refers to lifestyle choices and visits with your health care provider that can promote health and wellness. What does preventive care include?  A yearly physical exam. This is also called an annual well check.  Dental exams once or twice a year.  Routine eye exams. Ask your health care provider how often you should have your eyes checked.  Personal lifestyle choices, including:  Daily care of your teeth and gums.  Regular physical activity.  Eating a healthy diet.  Avoiding tobacco and drug use.  Limiting alcohol use.  Practicing safe sex.  Taking low doses of aspirin every day.  Taking vitamin and mineral supplements as recommended by your health care provider. What happens during an annual well check? The services and screenings done by your health care provider during your annual well check  will depend on your age, overall health, lifestyle risk factors, and family history of disease. Counseling  Your health care provider may ask you questions about your:  Alcohol use.  Tobacco use.  Drug use.  Emotional well-being.  Home and relationship well-being.  Sexual activity.  Eating habits.  History of falls.  Memory and ability to understand (cognition).  Work and work Statistician. Screening  You may have the following tests or measurements:  Height, weight, and BMI.  Blood pressure.  Lipid and cholesterol levels. These may be checked every 5 years, or more frequently if you are over 74 years old.  Skin check.  Lung cancer screening. You may have this screening every year starting at age 58 if you have a 30-pack-year history of smoking and currently smoke or have quit within the past 15 years.  Fecal occult blood test (FOBT) of the stool. You may have this test every year starting at age 36.  Flexible sigmoidoscopy or colonoscopy. You may have a sigmoidoscopy every 5 years or a colonoscopy every 10 years starting at age 37.  Prostate cancer screening. Recommendations will vary depending on your family history and other risks.  Hepatitis C blood test.  Hepatitis B blood test.  Sexually transmitted disease (STD) testing.  Diabetes screening. This is done by checking your blood sugar (glucose) after you have not eaten for a while (fasting). You may have this done every 1-3 years.  Abdominal aortic aneurysm (AAA) screening. You may need this if you are a current or former smoker.  Osteoporosis. You may be screened starting at age 40 if you are at high risk. Talk with your health care provider about your test results,  treatment options, and if necessary, the need for more tests. Vaccines  Your health care provider may recommend certain vaccines, such as:  Influenza vaccine. This is recommended every year.  Tetanus, diphtheria, and acellular pertussis  (Tdap, Td) vaccine. You may need a Td booster every 10 years.  Zoster vaccine. You may need this after age 18.  Pneumococcal 13-valent conjugate (PCV13) vaccine. One dose is recommended after age 15.  Pneumococcal polysaccharide (PPSV23) vaccine. One dose is recommended after age 58. Talk to your health care provider about which screenings and vaccines you need and how often you need them. This information is not intended to replace advice given to you by your health care provider. Make sure you discuss any questions you have with your health care provider. Document Released: 07/12/2015 Document Revised: 03/04/2016 Document Reviewed: 04/16/2015 Elsevier Interactive Patient Education  2017 [place] Prevention in the Home Falls can cause injuries. They can happen to people of all ages. There are many things you can do to make your home safe and to help prevent falls. What can I do on the outside of my home?  Regularly fix the edges of walkways and driveways and fix any cracks.  Remove anything that might make you trip as you walk through a door, such as a raised step or threshold.  Trim any bushes or trees on the path to your home.  Use bright outdoor lighting.  Clear any walking paths of anything that might make someone trip, such as rocks or tools.  Regularly check to see if handrails are loose or broken. Make sure that both sides of any steps have handrails.  Any raised decks and porches should have guardrails on the edges.  Have any leaves, snow, or ice cleared regularly.  Use sand or salt on walking paths during winter.  Clean up any spills in your garage right away. This includes oil or grease spills. What can I do in the bathroom?  Use night lights.  Install grab bars by the toilet and in the tub and shower. Do not use towel bars as grab bars.  Use non-skid mats or decals in the tub or shower.  If you need to sit down in the shower, use a plastic, non-slip  stool.  Keep the floor dry. Clean up any water that spills on the floor as soon as it happens.  Remove soap buildup in the tub or shower regularly.  Attach bath mats securely with double-sided non-slip rug tape.  Do not have throw rugs and other things on the floor that can make you trip. What can I do in the bedroom?  Use night lights.  Make sure that you have a light by your bed that is easy to reach.  Do not use any sheets or blankets that are too big for your bed. They should not hang down onto the floor.  Have a firm chair that has side arms. You can use this for support while you get dressed.  Do not have throw rugs and other things on the floor that can make you trip. What can I do in the kitchen?  Clean up any spills right away.  Avoid walking on wet floors.  Keep items that you use a lot in easy-to-reach places.  If you need to reach something above you, use a strong step stool that has a grab bar.  Keep electrical cords out of the way.  Do not use floor polish or wax that makes floors  slippery. If you must use wax, use non-skid floor wax.  Do not have throw rugs and other things on the floor that can make you trip. What can I do with my stairs?  Do not leave any items on the stairs.  Make sure that there are handrails on both sides of the stairs and use them. Fix handrails that are broken or loose. Make sure that handrails are as long as the stairways.  Check any carpeting to make sure that it is firmly attached to the stairs. Fix any carpet that is loose or worn.  Avoid having throw rugs at the top or bottom of the stairs. If you do have throw rugs, attach them to the floor with carpet tape.  Make sure that you have a light switch at the top of the stairs and the bottom of the stairs. If you do not have them, ask someone to add them for you. What else can I do to help prevent falls?  Wear shoes that:  Do not have high heels.  Have rubber bottoms.  Are  comfortable and fit you well.  Are closed at the toe. Do not wear sandals.  If you use a stepladder:  Make sure that it is fully opened. Do not climb a closed stepladder.  Make sure that both sides of the stepladder are locked into place.  Ask someone to hold it for you, if possible.  Clearly mark and make sure that you can see:  Any grab bars or handrails.  First and last steps.  Where the edge of each step is.  Use tools that help you move around (mobility aids) if they are needed. These include:  Canes.  Walkers.  Scooters.  Crutches.  Turn on the lights when you go into a dark area. Replace any light bulbs as soon as they burn out.  Set up your furniture so you have a clear path. Avoid moving your furniture around.  If any of your floors are uneven, fix them.  If there are any pets around you, be aware of where they are.  Review your medicines with your doctor. Some medicines can make you feel dizzy. This can increase your chance of falling. Ask your doctor what other things that you can do to help prevent falls. This information is not intended to replace advice given to you by your health care provider. Make sure you discuss any questions you have with your health care provider. Document Released: 04/11/2009 Document Revised: 11/21/2015 Document Reviewed: 07/20/2014 Elsevier Interactive Patient Education  2017 [employer].

## 2020-10-25 ENCOUNTER — Other Ambulatory Visit: Payer: Self-pay | Admitting: *Deleted

## 2020-10-25 ENCOUNTER — Ambulatory Visit (INDEPENDENT_AMBULATORY_CARE_PROVIDER_SITE_OTHER): Payer: Medicare Other | Admitting: Urology

## 2020-10-25 ENCOUNTER — Other Ambulatory Visit
Admission: RE | Admit: 2020-10-25 | Discharge: 2020-10-25 | Disposition: A | Discharge status: Home / Self Care | Payer: Medicare Other | Attending: Urology | Admitting: Urology

## 2020-10-25 ENCOUNTER — Encounter: Payer: Self-pay | Admitting: Urology

## 2020-10-25 ENCOUNTER — Other Ambulatory Visit: Payer: Self-pay

## 2020-10-25 VITALS — BP 146/77 | HR 69 | Ht 72.0 in | Wt 213.0 lb

## 2020-10-25 DIAGNOSIS — R972 Elevated prostate specific antigen [PSA]: Secondary | ICD-10-CM

## 2020-10-25 DIAGNOSIS — R3129 Other microscopic hematuria: Secondary | ICD-10-CM | POA: Insufficient documentation

## 2020-10-25 DIAGNOSIS — R3915 Urgency of urination: Secondary | ICD-10-CM | POA: Diagnosis not present

## 2020-10-25 DIAGNOSIS — H2513 Age-related nuclear cataract, bilateral: Secondary | ICD-10-CM | POA: Diagnosis not present

## 2020-10-25 DIAGNOSIS — R339 Retention of urine, unspecified: Secondary | ICD-10-CM | POA: Diagnosis not present

## 2020-10-25 LAB — URINALYSIS, COMPLETE (UACMP) WITH MICROSCOPIC
Bilirubin Urine: NEGATIVE
Glucose, UA: NEGATIVE mg/dL
Hgb urine dipstick: NEGATIVE
Ketones, ur: NEGATIVE mg/dL
Leukocytes,Ua: NEGATIVE
Nitrite: NEGATIVE
Protein, ur: NEGATIVE mg/dL
Specific Gravity, Urine: 1.015 (ref 1.005–1.030)
pH: 7 (ref 5.0–8.0)

## 2020-10-25 LAB — BLADDER SCAN AMB NON-IMAGING

## 2020-10-25 NOTE — Progress Notes (Signed)
10/25/2020 1:34 PM   David Mccullough Aug 04, 1945 [id number]  Referring provider: Juline Patch, MD [address] [place],  [place] [address]  Chief Complaint  Patient presents with  . Urinary Frequency    HPI: 75 year old male who returns today for 1 month follow-up.  At last visit, we adjusted his OAB medications.  He was previous on Myrbetriq but having difficulty affording this.  Oxybutynin 15 mg XL was not making a big difference.  More recently, he has been on Gemtesa 75 mg which seems to have improved his urinary symptoms.  He is happy with this medication is affordable.  PVR is mildly elevated today 141.  At last visit, he was also noted to have many bacteria and a little blood in his urine, 6-10 red blood cells but also many bacteria.  He is circumcised..  Urine culture was negative.  Repeat urinalysis today show no evidence of microscopic blood and is not headed on any other occasion.  He is a former smoker, quit many years ago, total of 25-pack-year history.   PMH: Past Medical History:  Diagnosis Date  . CAD (coronary artery disease)   . Colon polyps   . Depression   . Elevated PSA   . Hyperlipidemia   . Myocardial infarction ([facility])    x 2  . Persistent atrial fibrillation ([facility])   . Tachycardia     Surgical History: Past Surgical History:  Procedure Laterality Date  . CARDIAC ELECTROPHYSIOLOGY STUDY AND ABLATION     patient states due to V-tac  . CORONARY ARTERY BYPASS GRAFT N/A 03/22/2018   Procedure: CORONARY ARTERY BYPASS GRAFTING (CABG) x 3 WITH ENDOSCOPIC HARVESTING OF RIGHT GREATER SAPHENOUS VEIN;  Surgeon: Ivin Poot, MD;  Location: [facility];  Service: Open Heart Surgery;  Laterality: N/A;  . CORONARY STENT INTERVENTION N/A 03/16/2018   Procedure: CORONARY STENT INTERVENTION;  Surgeon: Yolonda Kida, MD;  Location: [facility];  Service: Cardiovascular;  Laterality: N/A;  . heart bypass    . LEFT ATRIAL APPENDAGE OCCLUSION  N/A 03/22/2018   Procedure: LEFT ATRIAL APPENDAGE OCCLUSION USING 40 MM ATRICURE ATRICLIP;  Surgeon: Ivin Poot, MD;  Location: [facility];  Service: Open Heart Surgery;  Laterality: N/A;  . LEFT HEART CATH AND CORONARY ANGIOGRAPHY N/A 03/15/2018   Procedure: LEFT HEART CATH AND CORONARY ANGIOGRAPHY and PCI stent;  Surgeon: Yolonda Kida, MD;  Location: [facility];  Service: Cardiovascular;  Laterality: N/A;  . TEE WITHOUT CARDIOVERSION N/A 03/22/2018   Procedure: TRANSESOPHAGEAL ECHOCARDIOGRAM (TEE);  Surgeon: Prescott Gum, Collier Salina, MD;  Location: [facility];  Service: Open Heart Surgery;  Laterality: N/A;    Home Medications:  Allergies as of 10/25/2020   No Known Allergies     Medication List       Accurate as of October 25, 2020  1:34 PM. If you have any questions, ask your nurse or doctor.        atorvastatin 80 MG tablet Commonly known as: LIPITOR TAKE 1 TABLET(80 MG) BY MOUTH DAILY AT 6 PM   Gemtesa 75 MG Tabs Generic drug: Vibegron Take 75 mg by mouth daily.   traZODone 50 MG tablet Commonly known as: DESYREL TAKE 1 TABLET(50 MG) BY MOUTH AT BEDTIME       Allergies: No Known Allergies  Family History: Family History  Problem Relation Age of Onset  . Heart disease Mother   . Heart disease Father   . Arrhythmia Sister   .  Heart disease Sister     Social History:  reports that he quit smoking about 33 years ago. His smoking use included cigarettes. He has a 25.00 pack-year smoking history. He has never used smokeless tobacco. He reports previous alcohol use. He reports previous drug use.   Physical Exam: BP (!) 146/77   Pulse 69   Ht 6' (1.829 m)   Wt 213 lb (96.6 kg)   BMI 28.89 kg/m   Constitutional:  Alert and oriented, No acute distress. HEENT: [place] AT, moist mucus membranes.  Trachea midline, no masses. Cardiovascular: No clubbing, cyanosis, or edema. Respiratory: Normal respiratory effort, no increased work of breathing. Skin: No rashes, bruises or  suspicious lesions. Neurologic: Grossly intact, no focal deficits, moving all 4 extremities. Psychiatric: Normal mood and affect.  Urinalysis    Component Value Date/Time   COLORURINE YELLOW 10/25/2020 0852   APPEARANCEUR CLEAR 10/25/2020 0852   LABSPEC 1.015 10/25/2020 0852   PHURINE 7.0 10/25/2020 0852   GLUCOSEU NEGATIVE 10/25/2020 0852   HGBUR NEGATIVE 10/25/2020 0852   BILIRUBINUR NEGATIVE 10/25/2020 0852   KETONESUR NEGATIVE 10/25/2020 0852   PROTEINUR NEGATIVE 10/25/2020 0852   NITRITE NEGATIVE 10/25/2020 0852   LEUKOCYTESUR NEGATIVE 10/25/2020 0852    Lab Results  Component Value Date   BACTERIA FEW (A) 10/25/2020    Pertinent Imaging: Results for orders placed or performed in visit on 10/25/20  BLADDER SCAN AMB NON-IMAGING  Result Value Ref Range   Scan Result 16ml     Assessment & Plan:    1. Urinary urgency Improved on Gemtesa 75 mg, will continue this medication - BLADDER SCAN AMB NON-IMAGING  2. Incomplete bladder emptying Mildly elevated postvoid residual today but is otherwise asymptomatic, no infections or urinary retention  We will continue to monitor, recheck in 6 months with PVR  Urinary retention precautions reviewed, advised to stop the above medication if he has any difficulty voiding   3. Microscopic hematuria Incidental small microscopic hematuria at last visit although there was many bacteria  Urinalysis today is negative and has had multiple negative in the past  We discussed the option for pursuing microscopic hematuria evaluation along with the risks and benefits.  Given that he is only had this on 1 occasion where there were multiple bacteria, may have been possibly contaminant.  If he did want a pursue hematuria evaluation, would recommend CT urogram and cystoscopy given his age and smoking history.  He like to think about this, talk to his wife and let us know if he would like to pursue this.  We will continue to check his urine at  each visit and certainly will have low threshold to move forward with the work-up if he ever has microscopic blood in his urine in the future.    Return in about 6 months (around 04/26/2021) for 28mo follow up w/ PVR IPSS UA, come for PSA prior.  Hollice Espy, MD  [facility] [address] [place], [place] [address] [phone]

## 2021-01-10 ENCOUNTER — Ambulatory Visit: Payer: Medicare Other | Admitting: Family Medicine

## 2021-03-26 DIAGNOSIS — I480 Paroxysmal atrial fibrillation: Secondary | ICD-10-CM | POA: Diagnosis not present

## 2021-03-26 DIAGNOSIS — I214 Non-ST elevation (NSTEMI) myocardial infarction: Secondary | ICD-10-CM | POA: Diagnosis not present

## 2021-03-26 DIAGNOSIS — I10 Essential (primary) hypertension: Secondary | ICD-10-CM | POA: Diagnosis not present

## 2021-03-26 DIAGNOSIS — I2510 Atherosclerotic heart disease of native coronary artery without angina pectoris: Secondary | ICD-10-CM | POA: Diagnosis not present

## 2021-03-26 DIAGNOSIS — Z951 Presence of aortocoronary bypass graft: Secondary | ICD-10-CM | POA: Diagnosis not present

## 2021-03-26 DIAGNOSIS — E782 Mixed hyperlipidemia: Secondary | ICD-10-CM | POA: Diagnosis not present

## 2021-04-24 ENCOUNTER — Other Ambulatory Visit: Payer: Self-pay

## 2021-04-24 ENCOUNTER — Other Ambulatory Visit
Admission: RE | Admit: 2021-04-24 | Discharge: 2021-04-24 | Disposition: A | Discharge status: Home / Self Care | Payer: Medicare Other | Attending: Urology | Admitting: Urology

## 2021-04-24 DIAGNOSIS — R972 Elevated prostate specific antigen [PSA]: Secondary | ICD-10-CM | POA: Diagnosis not present

## 2021-04-24 DIAGNOSIS — R3129 Other microscopic hematuria: Secondary | ICD-10-CM

## 2021-04-24 LAB — PSA: Prostatic Specific Antigen: 4.41 ng/mL — ABNORMAL HIGH (ref 0.00–4.00)

## 2021-04-24 NOTE — Progress Notes (Signed)
04/25/2021 9:00 AM   [name] 04/21/1946 [id number]  Referring provider: Juline Patch, MD [address] [place],  [place] [address]  Chief Complaint  Patient presents with   Hematuria    HPI: 75 year old male with OAB who presents today for 73-month follow-up.  He is currently on Gemtesa 75 mg and previously tried oxybutynin 15 mg XL, toviaz and Myrbetriq with less success.  He reports he believes this medication is still working well for him.  He occasionally has some severe urgency especially when driving which he still does remember visiting delivery.  Otherwise, he has no complaints.  IPSS as below.  He also has a personal history of borderline elevated PVRs.  These range in the 100-200 range.  PVR today is 123 cc.  He also had isolated occasion of microscopic hematuria in the setting of bacteriuria.  Urinalysis today reveals microscopic blood.    PSA trend: 3.6 11/18/2015 5.3 02/11/2017 4.1 03/22/2017 4.6 09/22/2017 3.7 on 08/02/2018  3.96 on 3/4 21 4.41 on 04/24/21   IPSS     Row Name 04/25/21 0800         International Prostate Symptom Score   How often have you had the sensation of not emptying your bladder? Less than 1 in 5     How often have you had to urinate less than every two hours? Less than half the time     How often have you found you stopped and started again several times when you urinated? Less than 1 in 5 times     How often have you found it difficult to postpone urination? Less than half the time     How often have you had a weak urinary stream? Less than half the time     How often have you had to strain to start urination? Less than 1 in 5 times     How many times did you typically get up at night to urinate? 2 Times     Total IPSS Score 11       Quality of Life due to urinary symptoms   If you were to spend the rest of your life with your urinary condition just the way it is now how would you feel about that? Mixed               Score:  1-7 Mild 8-19 Moderate 20-35 Severe   PMH: Past Medical History:  Diagnosis Date   CAD (coronary artery disease)    Colon polyps    Depression    Elevated PSA    Hyperlipidemia    Myocardial infarction ([facility])    x 2   Persistent atrial fibrillation ([facility])    Tachycardia     Surgical History: Past Surgical History:  Procedure Laterality Date   CARDIAC ELECTROPHYSIOLOGY STUDY AND ABLATION     patient states due to V-tac   CORONARY ARTERY BYPASS GRAFT N/A 03/22/2018   Procedure: CORONARY ARTERY BYPASS GRAFTING (CABG) x 3 WITH ENDOSCOPIC HARVESTING OF RIGHT GREATER SAPHENOUS VEIN;  Surgeon: Ivin Poot, MD;  Location: [facility];  Service: Open Heart Surgery;  Laterality: N/A;   CORONARY STENT INTERVENTION N/A 03/16/2018   Procedure: CORONARY STENT INTERVENTION;  Surgeon: Yolonda Kida, MD;  Location: [facility];  Service: Cardiovascular;  Laterality: N/A;   heart bypass     LEFT ATRIAL APPENDAGE OCCLUSION N/A 03/22/2018   Procedure: LEFT ATRIAL APPENDAGE OCCLUSION USING 40 MM ATRICURE ATRICLIP;  Surgeon:  Ivin Poot, MD;  Location: [facility];  Service: Open Heart Surgery;  Laterality: N/A;   LEFT HEART CATH AND CORONARY ANGIOGRAPHY N/A 03/15/2018   Procedure: LEFT HEART CATH AND CORONARY ANGIOGRAPHY and PCI stent;  Surgeon: Yolonda Kida, MD;  Location: [facility];  Service: Cardiovascular;  Laterality: N/A;   TEE WITHOUT CARDIOVERSION N/A 03/22/2018   Procedure: TRANSESOPHAGEAL ECHOCARDIOGRAM (TEE);  Surgeon: Prescott Gum, Collier Salina, MD;  Location: [facility];  Service: Open Heart Surgery;  Laterality: N/A;    Home Medications:  Allergies as of 04/25/2021   No Known Allergies      Medication List        Accurate as of April 25, 2021  9:00 AM. If you have any questions, ask your nurse or doctor.          atorvastatin 80 MG tablet Commonly known as: LIPITOR TAKE 1 TABLET(80 MG) BY MOUTH DAILY AT 6 PM   Gemtesa 75 MG Tabs Generic  drug: Vibegron Take 75 mg by mouth daily.   traZODone 50 MG tablet Commonly known as: DESYREL TAKE 1 TABLET(50 MG) BY MOUTH AT BEDTIME        Allergies: No Known Allergies  Family History: Family History  Problem Relation Age of Onset   Heart disease Mother    Heart disease Father    Arrhythmia Sister    Heart disease Sister     Social History:  reports that he quit smoking about 33 years ago. His smoking use included cigarettes. He has a 25.00 pack-year smoking history. He has never used smokeless tobacco. He reports that he does not currently use alcohol. He reports that he does not currently use drugs.   Physical Exam: BP 132/82   Pulse 99   Ht 6' (1.829 m)   Wt 220 lb (99.8 kg)   BMI 29.84 kg/m   Constitutional:  Alert and oriented, No acute distress. HEENT: [place] AT, moist mucus membranes.  Trachea midline, no masses. Cardiovascular: No clubbing, cyanosis, or edema. Respiratory: Normal respiratory effort, no increased work of breathing. GI: Abdomen is soft, nontender, nondistended, no abdominal masses Rectal: Normal sphincter tone.  40 cc prostate, no nodules. Skin: No rashes, bruises or suspicious lesions. Neurologic: Grossly intact, no focal deficits, moving all 4 extremities. Psychiatric: Normal mood and affect.  Laboratory Data: Lab Results  Component Value Date   WBC 13.7 (H) 03/30/2018   HGB 10.6 (L) 03/30/2018   HCT 32.9 (L) 03/30/2018   MCV 87.7 03/30/2018   PLT 414 (H) 03/30/2018    Lab Results  Component Value Date   CREATININE 1.25 (H) 04/25/2018    Lab Results  Component Value Date   HGBA1C 5.2 03/17/2018    Urinalysis Component     Latest Ref Rng & Units 04/25/2021          Color, Urine     YELLOW YELLOW  Appearance     CLEAR CLEAR  Specific Gravity, Urine     1.005 - 1.030 <1.005 (L)  pH     5.0 - 8.0 5.5  Glucose, UA     NEGATIVE mg/dL NEGATIVE  Hgb urine dipstick     NEGATIVE TRACE (A)  Bilirubin Urine     NEGATIVE  NEGATIVE  Ketones, ur     NEGATIVE mg/dL NEGATIVE  Protein     NEGATIVE mg/dL NEGATIVE  Nitrite     NEGATIVE NEGATIVE  Leukocytes,Ua     NEGATIVE NEGATIVE  Squamous Epithelial / LPF  0 - 5 0-5  WBC, UA     0 - 5 WBC/hpf 0-5  RBC / HPF     0 - 5 RBC/hpf 6-10  Bacteria, UA     NONE SEEN NONE SEEN     Pertinent Imaging: Results for orders placed or performed in visit on 04/25/21  BLADDER SCAN AMB NON-IMAGING  Result Value Ref Range   Scan Result 123 ml     Assessment & Plan:    1. Urinary urgency Doing well on Gemtesa 75 mg, continue this medication  2. Incomplete bladder emptying Persistently elevated PVRs although otherwise asymptomatic, will continue to monitor - BLADDER SCAN AMB NON-IMAGING  3. Elevated PSA Personal history of elevated/fluctuating PSA, PSA has risen slightly since last year but within his normal range  4. Microscopic hematuria Persistent microscopic hematuria  At this point time, would recommend complete hematuria evaluation including cystoscopy and CT urogram.  He is high risk given his age.  We discussed the differential diagnosis and work-up.  He is agreeable to proceed. - CT HEMATURIA WORKUP; Future  Hollice Espy, MD  [facility] [address] [place], [place] [address] [phone]

## 2021-04-25 ENCOUNTER — Encounter: Payer: Self-pay | Admitting: Urology

## 2021-04-25 ENCOUNTER — Other Ambulatory Visit: Payer: Self-pay

## 2021-04-25 ENCOUNTER — Other Ambulatory Visit
Admission: RE | Admit: 2021-04-25 | Discharge: 2021-04-25 | Disposition: A | Discharge status: Home / Self Care | Payer: Medicare Other | Attending: Urology | Admitting: Urology

## 2021-04-25 ENCOUNTER — Ambulatory Visit (INDEPENDENT_AMBULATORY_CARE_PROVIDER_SITE_OTHER): Payer: Medicare Other | Admitting: Urology

## 2021-04-25 VITALS — BP 132/82 | HR 99 | Ht 72.0 in | Wt 220.0 lb

## 2021-04-25 DIAGNOSIS — R3915 Urgency of urination: Secondary | ICD-10-CM

## 2021-04-25 DIAGNOSIS — R3129 Other microscopic hematuria: Secondary | ICD-10-CM | POA: Diagnosis not present

## 2021-04-25 DIAGNOSIS — R339 Retention of urine, unspecified: Secondary | ICD-10-CM

## 2021-04-25 DIAGNOSIS — R972 Elevated prostate specific antigen [PSA]: Secondary | ICD-10-CM | POA: Diagnosis not present

## 2021-04-25 LAB — URINALYSIS, COMPLETE (UACMP) WITH MICROSCOPIC
Bacteria, UA: NONE SEEN
Bilirubin Urine: NEGATIVE
Glucose, UA: NEGATIVE mg/dL
Ketones, ur: NEGATIVE mg/dL
Leukocytes,Ua: NEGATIVE
Nitrite: NEGATIVE
Protein, ur: NEGATIVE mg/dL
Specific Gravity, Urine: <1.005 — ABNORMAL LOW (ref 1.005–1.030)
pH: 5.5 (ref 5.0–8.0)

## 2021-04-25 LAB — BLADDER SCAN AMB NON-IMAGING: Scan Result: 123

## 2021-04-25 NOTE — Patient Instructions (Signed)

## 2021-05-09 ENCOUNTER — Other Ambulatory Visit: Payer: Medicare Other | Admitting: Urology

## 2021-05-27 ENCOUNTER — Ambulatory Visit
Admission: RE | Admit: 2021-05-27 | Discharge: 2021-05-27 | Disposition: A | Discharge status: Home / Self Care | Payer: Medicare Other | Source: Ambulatory Visit | Attending: Urology | Admitting: Urology

## 2021-05-27 ENCOUNTER — Other Ambulatory Visit: Payer: Self-pay

## 2021-05-27 DIAGNOSIS — N200 Calculus of kidney: Secondary | ICD-10-CM | POA: Diagnosis not present

## 2021-05-27 DIAGNOSIS — C642 Malignant neoplasm of left kidney, except renal pelvis: Secondary | ICD-10-CM

## 2021-05-27 DIAGNOSIS — N2889 Other specified disorders of kidney and ureter: Secondary | ICD-10-CM

## 2021-05-27 DIAGNOSIS — R3129 Other microscopic hematuria: Secondary | ICD-10-CM | POA: Insufficient documentation

## 2021-05-27 DIAGNOSIS — R911 Solitary pulmonary nodule: Secondary | ICD-10-CM

## 2021-05-27 HISTORY — DX: Malignant neoplasm of left kidney, except renal pelvis: C64.2

## 2021-05-27 HISTORY — DX: Solitary pulmonary nodule: R91.1

## 2021-05-27 HISTORY — DX: Essential (primary) hypertension: I10

## 2021-05-27 HISTORY — DX: Other specified disorders of kidney and ureter: N28.89

## 2021-05-27 LAB — POCT I-STAT CREATININE: Creatinine, Ser: 1.2 mg/dL (ref 0.61–1.24)

## 2021-05-27 MED ORDER — IOHEXOL 350 MG/ML SOLN
100.0000 mL | Freq: Once | INTRAVENOUS | Status: AC | PRN
  Administered 2021-05-27: 100 mL via INTRAVENOUS

## 2021-05-29 ENCOUNTER — Telehealth: Payer: Self-pay

## 2021-05-29 NOTE — Telephone Encounter (Signed)
Report is available via mychart.  They have an appt to discuss later this month.  If they want to be seen earlier, I have opened up by schedule this coming Thursday AM.  Hollice Espy, MD

## 2021-05-29 NOTE — Telephone Encounter (Signed)
Pt wife called asking for CT results on patient.

## 2021-05-30 NOTE — Telephone Encounter (Signed)
Patient advised, scheduled appointment 06/05/21 Voiced understanding

## 2021-06-04 NOTE — Progress Notes (Signed)
   06/05/2021 CC:  Chief Complaint  Patient presents with   Cysto      HPI: David Mccullough is a 75 y.o. male with a personal history of urinary urgency, incomplete bladder emptying, elevated PSA, and microscopic hematuria, who presents today for a cystoscopy.   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 No adenopathy or tumor thrombus in the left renal vein. 9 x 6 x 8 mm solid right lower lobe nodule. Fleischner criteria do not apply given the presence of the left renal mass. Bilateral single nonobstructive renal calculi. Tiny bladder calculi are noted and prostatomegaly.  Vitals:   06/05/21 0902  BP: (!) 194/73  Pulse: 73   NED. A&Ox3.   No respiratory distress   Abd soft, NT, ND Normal phallus with bilateral descended testicles  Cystoscopy Procedure Note  Patient identification was confirmed, informed consent was obtained, and patient was prepped using Betadine solution.  Lidocaine jelly was administered per urethral meatus.     Pre-Procedure: - Inspection reveals a normal caliber ureteral meatus.  Procedure: The flexible cystoscope was introduced without difficulty - No urethral strictures/lesions are present. 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   I have reviewed the above documentation  for accuracy and completeness, and I agree with the above.   Hollice Espy, MD

## 2021-06-05 ENCOUNTER — Encounter: Payer: Self-pay | Admitting: Urology

## 2021-06-05 ENCOUNTER — Ambulatory Visit (INDEPENDENT_AMBULATORY_CARE_PROVIDER_SITE_OTHER): Payer: Medicare Other | Admitting: Urology

## 2021-06-05 ENCOUNTER — Other Ambulatory Visit: Payer: Self-pay

## 2021-06-05 ENCOUNTER — Other Ambulatory Visit: Payer: Self-pay | Admitting: Urology

## 2021-06-05 VITALS — BP 194/73 | HR 73 | Ht 72.0 in | Wt 220.0 lb

## 2021-06-05 DIAGNOSIS — R911 Solitary pulmonary nodule: Secondary | ICD-10-CM | POA: Diagnosis not present

## 2021-06-05 DIAGNOSIS — R3915 Urgency of urination: Secondary | ICD-10-CM | POA: Diagnosis not present

## 2021-06-05 DIAGNOSIS — C679 Malignant neoplasm of bladder, unspecified: Secondary | ICD-10-CM

## 2021-06-05 DIAGNOSIS — D494 Neoplasm of unspecified behavior of bladder: Secondary | ICD-10-CM

## 2021-06-05 LAB — URINALYSIS, COMPLETE
Bilirubin, UA: NEGATIVE
Glucose, UA: NEGATIVE
Ketones, UA: NEGATIVE
Leukocytes,UA: NEGATIVE
Nitrite, UA: NEGATIVE
Protein,UA: NEGATIVE
Specific Gravity, UA: 1.015 (ref 1.005–1.030)
Urobilinogen, Ur: 0.2 mg/dL (ref 0.2–1.0)
pH, UA: 6 (ref 5.0–7.5)

## 2021-06-05 LAB — MICROSCOPIC EXAMINATION: Bacteria, UA: NONE SEEN

## 2021-06-05 MED ORDER — FINASTERIDE 5 MG PO TABS: 5.0000 mg | ORAL_TABLET | Freq: Every day | ORAL | 3 refills | Status: DC

## 2021-06-05 MED ORDER — TAMSULOSIN HCL 0.4 MG PO CAPS: 0.4000 mg | ORAL_CAPSULE | Freq: Every day | ORAL | 3 refills | Status: DC

## 2021-06-05 NOTE — Progress Notes (Signed)
Surgical Physician Order Form [facility]  * Scheduling expectation : Next Available  *Length of Case:   *Clearance needed: yes, cardiac  *Anticoagulation Instructions: Hold all anticoagulants  *Aspirin Instructions: Ok to continue Aspirin  *Post-op visit Date/Instructions:  1 month follow up  *Diagnosis: Bladder Tumor  *Procedure:     TURBT with Gemcitabine instillation (small)   Additional orders: Gemcitabine 2000mg  bladder instillation  -Admit type: OUTpatient  -Anesthesia: General  -VTE Prophylaxis Standing Order SCD's       Other:   -Standing Lab Orders Per Anesthesia    Lab other: None  -Standing Test orders EKG/Chest x-ray per Anesthesia       Test other:   - Medications:  Ancef 2gm IV  -Other orders:  N/A

## 2021-06-05 NOTE — Progress Notes (Addendum)
06/05/21 12:00 PM   David Mccullough [birth year] [id number]  Referring provider:  Juline Patch, MD [address] [place],  [place] [address] Chief Complaint  Patient presents with   Cysto     HPI: David Mccullough is a 75 y.o.male with a personal history of urinary urgency, incomplete bladder emptying, elevated PSA, and microscopic hematuria, who presents today for a cystoscopy for further work up of microscopic hematuria.     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 No adenopathy or tumor thrombus in the left renal vein. 9 x 6 x 8 mm solid right lower lobe nodule. Fleischner criteria do not apply given the presence of the left renal mass. Bilateral single nonobstructive renal calculi. Tiny bladder calculi are noted and prostatomegaly.   He is doing well today. He is accompanied by his wife.  PMH: Past Medical History:  Diagnosis Date   CAD (coronary artery disease)    Colon polyps    Depression    Elevated PSA    Hyperlipidemia    Hypertension    Myocardial infarction (HCC)    x 2   Persistent atrial fibrillation (HCC)    Tachycardia     Surgical History: Past Surgical History:  Procedure Laterality Date   CARDIAC ELECTROPHYSIOLOGY STUDY AND ABLATION     patient states due to V-tac   CORONARY ARTERY BYPASS GRAFT N/A 03/22/2018   Procedure: CORONARY ARTERY BYPASS GRAFTING (CABG) x 3 WITH ENDOSCOPIC HARVESTING OF RIGHT GREATER SAPHENOUS VEIN;  Surgeon: Ivin Poot, MD;  Location: [facility];  Service: Open Heart Surgery;  Laterality: N/A;   CORONARY STENT INTERVENTION N/A 03/16/2018   Procedure: CORONARY STENT INTERVENTION;  Surgeon: Yolonda Kida, MD;  Location: [facility];  Service: Cardiovascular;  Laterality: N/A;   heart bypass     LEFT ATRIAL APPENDAGE OCCLUSION N/A 03/22/2018   Procedure: LEFT ATRIAL  APPENDAGE OCCLUSION USING 40 MM ATRICURE ATRICLIP;  Surgeon: Ivin Poot, MD;  Location: [facility];  Service: Open Heart Surgery;  Laterality: N/A;   LEFT HEART CATH AND CORONARY ANGIOGRAPHY N/A 03/15/2018   Procedure: LEFT HEART CATH AND CORONARY ANGIOGRAPHY and PCI stent;  Surgeon: Yolonda Kida, MD;  Location: [facility];  Service: Cardiovascular;  Laterality: N/A;   TEE WITHOUT CARDIOVERSION N/A 03/22/2018   Procedure: TRANSESOPHAGEAL ECHOCARDIOGRAM (TEE);  Surgeon: Prescott Gum, Collier Salina, MD;  Location: [facility];  Service: Open Heart Surgery;  Laterality: N/A;    Home Medications:  Allergies as of 06/05/2021   No Known Allergies      Medication List        Accurate as of June 05, 2021 12:00 PM. If you have any questions, ask your nurse or doctor.          atorvastatin 80 MG tablet Commonly known as: LIPITOR TAKE 1 TABLET(80 MG) BY MOUTH DAILY AT 6 PM   finasteride 5 MG tablet Commonly known as: PROSCAR Take 1 tablet (5 mg total) by mouth daily. Started by: Hollice Espy, MD   Gemtesa 75 MG Tabs Generic drug: Vibegron Take 75 mg by mouth daily.   tamsulosin 0.4 MG Caps capsule Commonly known as: FLOMAX Take 1 capsule (0.4 mg total) by mouth daily. Started by: Hollice Espy, MD   traZODone 50 MG tablet Commonly known as: DESYREL TAKE 1 TABLET(50 MG) BY MOUTH AT BEDTIME  Allergies: No Known Allergies  Family History: Family History  Problem Relation Age of Onset   Heart disease Mother    Heart disease Father    Arrhythmia Sister    Heart disease Sister     Social History:  reports that he quit smoking about 33 years ago. His smoking use included cigarettes. He has a 25.00 pack-year smoking history. He has never used smokeless tobacco. He reports that he does not currently use alcohol. He reports that he does not currently use drugs.   Physical Exam: BP (!) 194/73   Pulse 73   Ht 6' (1.829 m)   Wt 220 lb (99.8 kg)   BMI 29.84 kg/m    Constitutional:  Alert and oriented, No acute distress. HEENT: [place] AT, moist mucus membranes.  Trachea midline, no masses. Cardiovascular: No clubbing, cyanosis, or edema. Respiratory: Normal respiratory effort, no increased work of breathing. Skin: No rashes, bruises or suspicious lesions. Neurologic: Grossly intact, no focal deficits, moving all 4 extremities. Psychiatric: Normal mood and affect.  Laboratory Data:  Lab Results  Component Value Date   CREATININE 1.20 05/27/2021   Lab Results  Component Value Date   HGBA1C 5.2 03/17/2018    Pertinent Imaging: CLINICAL DATA:  Microscopic hematuria. Elevated PSA level. Frequent urination.   EXAM: CT ABDOMEN AND PELVIS WITHOUT AND WITH CONTRAST   TECHNIQUE: Multidetector CT imaging of the abdomen and pelvis was performed following the standard protocol before and following the bolus administration of intravenous contrast.   CONTRAST:  124mL OMNIPAQUE IOHEXOL 350 MG/ML SOLN   COMPARISON:  None.   FINDINGS: Lower chest: 9 by 6 by 8 mm (volume = 200 mm^3) solid right lower lobe nodule on image 6 series 3. 2 mm right lower lobe nodule on image 11 series 11.   Scattered locules of gas in the right atrium and right ventricle on the noncontrast images; this is usually due to gas introduced from IV tubing and is typically benign/incidental as long as the patient does not have a right to left cardiac shunt.   Small type 1 hiatal hernia.  Mild cardiomegaly.   Hepatobiliary: Dependent gallstone in the gallbladder measuring 1.0 cm in long axis. The liver appears otherwise unremarkable. No biliary dilatation.   Pancreas: Unremarkable   Spleen: Unremarkable   Adrenals/Urinary Tract: Both adrenal glands appear normal.   3 mm nonobstructive left kidney upper pole calculus, image 104 series 5. 2 mm right mid kidney nonobstructive renal calculus, image 98 series 5.   Faint speckled calcifications along the left posterior  urinary bladder wall on images 66-68 of series 2, in the 1-2 mm range, probably small bladder calculi, I cannot exclude a tiny left UVJ calculus among these. These presumed calculi are less conspicuous on the delayed phase images probably due to being similar density to the surrounding contrast and due to the small size.   Partially exophytic from the left kidney lower pole anteriorly we demonstrate a 4.1 by 3.6 by 4.7 cm enhancing mass likely with some degree of central necrosis. No internal fat density. High suspicion for renal cell carcinoma based on morphology and appearance.   8 mm hypodense lesion of the left kidney upper pole on image 27 series 9, probably a small cyst but technically too small to characterize.   No additional significant filling defect is identified along the urothelium.   Stomach/Bowel: Small type 1 hiatal hernia. Prominent stool throughout the colon favors constipation.   Vascular/Lymphatic: Atherosclerosis is present, including aortoiliac atherosclerotic  disease. No pathologic retroperitoneal adenopathy. No observed tumor thrombus in the left renal vein or IVC.   Reproductive: The prostate gland measures approximately 5.8 by 5.2 by 6.3 cm (volume = 99 cm^3) compatible with prostatomegaly.   Other: No supplemental non-categorized findings.   Musculoskeletal: Lower lumbar spondylosis noted with degenerative disc disease at the L4-5 level. I do not observe compelling findings of osseous metastatic disease.   Small umbilical hernia contains adipose tissue.   IMPRESSION: 1. 4.7 cm enhancing mass of the left kidney lower pole with morphologic characteristics most compatible with renal cell carcinoma. No adenopathy or tumor thrombus in the left renal vein. 2. 9 by 6 by 8 mm solid right lower lobe nodule. Fleischner criteria do not apply given the presence of the left renal mass. Completion imaging of the chest may be warranted in the context of  staging workup. Surveillance of the dominant right lower lobe nodule is suggested. 3. Bilateral single nonobstructive renal calculi. Tiny bladder calculi are noted. 4. Prostatomegaly. 5. Other imaging findings of potential clinical significance: Small type 1 hiatal hernia. Mild cardiomegaly. Cholelithiasis. Prominent stool throughout the colon favors constipation. Aortic Atherosclerosis (ICD10-I70.0). Lower lumbar spondylosis and degenerative disc disease. Small umbilical hernia contains adipose tissue.   Impression # 1 will be called to the ordering clinician or representative by the Radiologist Assistant, and communication documented in the PACS or [employer].     Electronically Signed   By: Van Clines M.D.   On: 05/27/2021 16:11  CT scan imaging personally reviewed, agree with radiological interpretation.  Assessment & Plan:    Bladder cancer TCC  - Cystoscopy showed 1.5 cm relatively flat papillary tumor lateral to the left UO consisted with TCC.  -Recommend proceeding to the operating room for TURBT with instillation of intravesical gemcitabine.  Risk and benefits including risk of bleeding, infection, damage chronic structures, need for Foley catheter amongst others discussed.  All questions were answered. -Preoperative urine culture today, will need cardiac clearance.  Okay to continue aspirin for the procedure.  2. Left renal mass  -4.7 cm enhancing renal mass suspicious for RCC but has a discrete area of scarring/necrosis possibly representing oncocytoma.  There is also a second smaller lesion which is incompletely characterized. -A solid renal mass raises the suspicion of primary renal malignancy.  We discussed this in detail and in regards to the spectrum of renal masses which includes cysts (pure cysts are considered benign), solid masses and everything in between. The risk of metastasis increases as the size of solid renal mass increases. In general, it is  believed that the risk of metastasis for renal masses less than 3-4 cm is small (up to approximately 5%) based mainly on large retrospective studies. In some cases and especially in patients of older age and multiple comorbidities a surveillance approach may be appropriate. The treatment of solid renal masses includes: surveillance, cryoablation (percutaneous and laparoscopic) in addition to partial and complete nephrectomy (each with option of laparoscopic, robotic and open depending on appropriateness).  -- discussed IR renal mass biopsy risk and benefits  - In light of other comobrbities  Would recommend biopsy. Discussed risk of biopsy including infection and bleeding. He would like to proceed with this   3.Pulmonary nodule/chronic cough  - Incidental  pulmonary nodule in the setting of 2 possible malignancies but is also symptomatic  - In light of multiple probably malignancies  Recommend chest CT with contrast. He is agreeable with this plan    4.  BPH with urinary frequency outlet obstruction  - Cysto and CT shows consistent with prostatomegaly and chronic outlet obstruction - May benefit with outlet procedure in the future  - Will start on flomax and finasteride while others issues are addressed  -continue   I,Kailey Littlejohn,acting as a scribe for Hollice Espy, MD.,have documented all relevant documentation on the behalf of Hollice Espy, MD,as directed by  Hollice Espy, MD while in the presence of Hollice Espy, MD.  I have reviewed the above documentation for accuracy and completeness, and I agree with the above.   Hollice Espy, MD   [facility] [address], [place], [place] [address] [phone]  I spent 45 total minutes on the day of the encounter including pre-visit review of the medical record, face-to-face time with the patient, and post visit ordering of labs/imaging/tests.

## 2021-06-05 NOTE — Patient Instructions (Signed)
Transurethral Resection of Bladder Tumor Transurethral resection of a bladder tumor is the removal (resection) of a cancerous growth (tumor) on the inside wall of the bladder. The bladder is the organ that holds urine. The tumor is removed through the tube that carries urine out of the body (urethra). In a transurethral resection, a thin telescope with a light, a tiny camera, and an electric cutting edge (resectoscope) is passed through the urethra. In men, the opening of the urethra is at the end of the penis. In women, it is just above the opening of the vagina. Tell a health care provider about: Any allergies you have. All medicines you are taking, including vitamins, herbs, eye drops, creams, and over-the-counter medicines. Any problems you or family members have had with anesthetic medicines. Any blood disorders you have. Any surgeries you have had. Any medical conditions you have. Any recent urinary tract infections you have had. Whether you are pregnant or may be pregnant. What are the risks? Generally, this is a safe procedure. However, problems may occur, including: Infection. Bleeding. Allergic reactions to medicines. Damage to nearby structures or organs, such as: The urethra. The tubes that drain urine from the kidneys into the bladder (ureters). Pain and burning during urination. Difficulty urinating due to partial blockage of the urethra. Inability to urinate (urinary retention). What happens before the procedure? Staying hydrated Follow instructions from your health care provider about hydration, which may include: Up to 2 hours before the procedure - you may continue to drink clear liquids, such as water, clear fruit juice, black coffee, and plain tea.  Eating and drinking restrictions Follow instructions from your health care provider about eating and drinking, which may include: 8 hours before the procedure - stop eating heavy meals or foods, such as meat, fried foods,  or fatty foods. 6 hours before the procedure - stop eating light meals or foods, such as toast or cereal. 6 hours before the procedure - stop drinking milk or drinks that contain milk. 2 hours before the procedure - stop drinking clear liquids. Medicines Ask your health care provider about: Changing or stopping your regular medicines. This is especially important if you are taking diabetes medicines or blood thinners. Taking medicines such as aspirin and ibuprofen. These medicines can thin your blood. Do not take these medicines unless your health care provider tells you to take them. Taking over-the-counter medicines, vitamins, herbs, and supplements. Tests You may have exams or tests, including: Physical exam. Blood tests. Urine tests. Electrocardiogram (ECG). This test measures the electrical activity of the heart. General instructions Plan to have someone take you home from the hospital or clinic. Ask your health care provider how your surgical site will be marked or identified. Ask your health care provider what steps will be taken to help prevent infection. These may include: Washing skin with a germ-killing soap. Taking antibiotic medicine. What happens during the procedure? An IV will be inserted into one of your veins. You will be given one or more of the following: A medicine to help you relax (sedative). A medicine to make you fall asleep (general anesthetic). A medicine that is injected into your spine to numb the area below and slightly above the injection site (spinal anesthetic). Your legs will be placed in foot rests (stirrups) so that your legs are apart and your knees are bent. The resectoscope will be passed through your urethra and into your bladder. The part of your bladder that is affected by the tumor will be  resected using the cutting edge of the resectoscope. The resectoscope will be removed. A thin, flexible tube (catheter) will be passed through your urethra  and into your bladder. The catheter will drain urine into a bag outside of your body. Fluid may be passed through the catheter to keep the catheter open. The procedure may vary among health care providers and hospitals. What happens after the procedure? Your blood pressure, heart rate, breathing rate, and blood oxygen level will be monitored until you leave the hospital or clinic. You may continue to receive fluids and medicines through an IV. You will have some pain. You will be given pain medicine to relieve pain. You will have a catheter to drain your urine. You will have blood in your urine. Your catheter may be kept in until your urine is clear. The amount of urine will be monitored. If necessary, your bladder may be rinsed out (irrigated) by passing fluid through your catheter. You will be encouraged to walk around as soon as possible. You may have to wear compression stockings. These stockings help to prevent blood clots and reduce swelling in your legs. Do not drive for 24 hours if you were given a sedative during your procedure. Summary Transurethral resection of a bladder tumor is the removal (resection) of a cancerous growth (tumor) on the inside wall of the bladder. To do this procedure, your health care provider uses a thin telescope with a light, a tiny camera, and an electric cutting edge (resectoscope). Follow your health care provider's instructions. You may need to stop or change certain medicines, and you may be told to stop eating and drinking several hours before the procedure. Your blood pressure, heart rate, breathing rate, and blood oxygen level will be monitored until you leave the hospital or clinic. You may have to wear compression stockings. These stockings help to prevent blood clots and reduce swelling in your legs. This information is not intended to replace advice given to you by your health care provider. Make sure you discuss any questions you have with your  health care provider. Document Revised: 01/13/2018 Document Reviewed: 01/14/2018 Elsevier Patient Education  [place].

## 2021-06-06 ENCOUNTER — Other Ambulatory Visit: Payer: Medicare Other | Admitting: Urology

## 2021-06-09 ENCOUNTER — Telehealth: Payer: Self-pay

## 2021-06-09 DIAGNOSIS — N2889 Other specified disorders of kidney and ureter: Secondary | ICD-10-CM

## 2021-06-09 NOTE — Telephone Encounter (Signed)
Message sent to IR scheduling awaiting response

## 2021-06-09 NOTE — Telephone Encounter (Signed)
-----   Message from Hollice Espy, MD sent at 06/05/2021  1:07 PM EST ----- Left renal mass bx

## 2021-06-10 NOTE — Telephone Encounter (Signed)
Per IR scheduling patient is scheduled for Thurs 12/29 at 9:30a and arrive at 8:30a. IR Nurse will call to review instructions prior to the procedure.   Called patient and left detailed message per DPR notifying him of apt date and time

## 2021-06-10 NOTE — Telephone Encounter (Signed)
Order placed and form faxed

## 2021-06-11 ENCOUNTER — Telehealth: Payer: Self-pay | Admitting: Urgent Care

## 2021-06-11 LAB — CULTURE, URINE COMPREHENSIVE

## 2021-06-11 NOTE — Progress Notes (Signed)
[facility] Posting Form   Surgery Date/Time: Date: 07/28/2021  Surgeon: Dr. Hollice Espy, MD  Surgery Location: Day Surgery  Inpt ( No  )   Outpt (Yes)   Obs ( No  )   Diagnosis: D49.4 Bladder Tumor  -CPT: [id number]  Surgery: Transurethral Resection of Bladder Tumor with instillation of Gemcitabine  Stop Anticoagulations: Yes  Cardiac/Medical/Pulmonary Clearance needed: yes cardiac  Clearance needed from Dr: Clayborn Bigness  Clearance request sent on: Date: 06/11/21   *Orders entered into EPIC  Date: 06/11/21   *Case booked in [place]  Date: 06/10/2021  *Notified pt of Surgery: Date: 06/10/2021  PRE-OP UA & CX: none  *Placed into Prior Authorization Work [place] Date: 06/11/21   Assistant/laser/rep:No

## 2021-06-11 NOTE — Progress Notes (Signed)
°  Perioperative Services Pre-Admission/Anesthesia Testing     Date: 06/11/21  Name: David Mccullough MRN:   [id number]  Re: Request from surgery for clearance prior to scheduled procedure  Patient is scheduled to undergo a TURBT on 07/28/2021 with Dr. Hollice Espy, MD. Patient has not been scheduled for his PAT appointment at this point, thus has not undergone review by PAT RN and/or APP. Received communication from primary attending surgeon's office requesting that patient be submitted for clearance from CARDIOLOGY .   PROVIDER SPECIALTY FAXED TO   Katrine Coho, MD  Cardiology  [phone]   Plan:  Clearance documents generated and faxed to appropriate provider(s) as noted above. Note will be updated to reflect communication with provider's office as it relates to clearance being provided and/or the need for office visit prior to clearance for surgery being issued.   Honor Loh, MSN, APRN, FNP-C, CEN [facility]  Peri-operative Services Nurse Practitioner Phone: [phone] 06/11/21 10:50 AM  NOTE: This note has been prepared using Dragon dictation software. Despite my best ability to proofread, there is always the potential that unintentional transcriptional errors may still occur from this process.

## 2021-06-11 NOTE — Progress Notes (Signed)
°  Perioperative Services Pre-Admission/Anesthesia Testing     Date: 06/11/21  Name: David Mccullough MRN:   [id number]  Re: Surgical clearance  Surgical clearance received from Dr. Cheryll Cockayne office (cardiology). Patient has been cleared for the planned TURBT scheduled for 07/28/2020 with Dr. Hollice Espy.  Dr. Clayborn Bigness notes that patient may proceed with an overall LOW risk stratification. Copy of signed clearance form placed on patient's OR chart for review by the surgical/anesthetic team on the day of his procedure.   Honor Loh, MSN, APRN, FNP-C, CEN [facility]  Peri-operative Services Nurse Practitioner Phone: [phone] 06/11/21 4:09 PM

## 2021-06-11 NOTE — Progress Notes (Signed)
REQUEST FOR SURGICAL CLEARANCE       Date: Date: 06/11/2021  Faxed to: [facility] Cardiology/Dr. Clayborn Bigness  Surgeon: Dr. Hollice Espy, MD     Date of Surgery: 07/28/2021  Operation: TURBT with Instillation of Gemcitabine  Anesthesia Type: General   Diagnosis: Bladder Tumor  Patient Requires:   Cardiac / Vascular Clearance : Yes   Risk Assessment:    Low   []       Moderate   []     High   []           This patient is optimized for surgery  YES []       NO   []    I recommend further assessment/workup prior to surgery. YES []      NO  []   Appointment scheduled for: _______________________   Further recommendations: ____________________________________     Physician Signature:__________________________________   Printed Name: ________________________________________   Date: _________________

## 2021-06-13 ENCOUNTER — Ambulatory Visit
Admission: RE | Admit: 2021-06-13 | Discharge: 2021-06-13 | Disposition: A | Discharge status: Home / Self Care | Payer: Medicare Other | Source: Ambulatory Visit | Attending: Urology | Admitting: Urology

## 2021-06-13 ENCOUNTER — Other Ambulatory Visit: Payer: Medicare Other | Admitting: Urology

## 2021-06-13 ENCOUNTER — Other Ambulatory Visit: Payer: Self-pay

## 2021-06-13 DIAGNOSIS — R911 Solitary pulmonary nodule: Secondary | ICD-10-CM | POA: Insufficient documentation

## 2021-06-13 MED ORDER — IOHEXOL 300 MG/ML  SOLN
100.0000 mL | Freq: Once | INTRAMUSCULAR | Status: AC | PRN
  Administered 2021-06-13: 100 mL via INTRAVENOUS

## 2021-06-18 NOTE — Telephone Encounter (Signed)
My chart message sent for notification

## 2021-06-19 ENCOUNTER — Telehealth: Payer: Self-pay

## 2021-06-19 NOTE — Telephone Encounter (Signed)
Patient aware. And verbalized understanding.

## 2021-06-19 NOTE — Telephone Encounter (Signed)
-----   Message from Hollice Espy, MD sent at 06/19/2021  4:13 PM EST ----- The lung nodule on CT looks the same as on the previous study and there is no additional nodules which is good news.  It is unclear what this might represent.  Lets go ahead and move forward with work-up and treatment other to issues, the bladder tumor and renal mass and then depending on the findings, we may end up getting medical oncology involved as well.  We will probably add that repeating the study in about 3 months as well to make sure that the lung nodule has not changed or progressed.  Hollice Espy, MD

## 2021-06-24 NOTE — Progress Notes (Signed)
Patient on schedule for Renal biopsy 06/26/2021, called and spoke with patients wife on phone with pre procedure instructions given.made aware to be here @ 0830, NPO after MN prior to procedure as well as driver post procedure/recovery/discharge, stated understanding.

## 2021-06-26 ENCOUNTER — Ambulatory Visit
Admission: RE | Admit: 2021-06-26 | Discharge: 2021-06-26 | Disposition: A | Discharge status: Home / Self Care | Payer: Medicare Other | Source: Ambulatory Visit | Attending: Urology | Admitting: Urology

## 2021-06-26 ENCOUNTER — Other Ambulatory Visit: Payer: Self-pay

## 2021-06-26 DIAGNOSIS — I4891 Unspecified atrial fibrillation: Secondary | ICD-10-CM | POA: Diagnosis not present

## 2021-06-26 DIAGNOSIS — R3129 Other microscopic hematuria: Secondary | ICD-10-CM | POA: Insufficient documentation

## 2021-06-26 DIAGNOSIS — I10 Essential (primary) hypertension: Secondary | ICD-10-CM | POA: Diagnosis not present

## 2021-06-26 DIAGNOSIS — Z951 Presence of aortocoronary bypass graft: Secondary | ICD-10-CM | POA: Insufficient documentation

## 2021-06-26 DIAGNOSIS — C642 Malignant neoplasm of left kidney, except renal pelvis: Secondary | ICD-10-CM | POA: Insufficient documentation

## 2021-06-26 DIAGNOSIS — Z87442 Personal history of urinary calculi: Secondary | ICD-10-CM | POA: Diagnosis not present

## 2021-06-26 DIAGNOSIS — N2889 Other specified disorders of kidney and ureter: Secondary | ICD-10-CM

## 2021-06-26 DIAGNOSIS — R911 Solitary pulmonary nodule: Secondary | ICD-10-CM | POA: Insufficient documentation

## 2021-06-26 DIAGNOSIS — Z87891 Personal history of nicotine dependence: Secondary | ICD-10-CM | POA: Diagnosis not present

## 2021-06-26 DIAGNOSIS — E785 Hyperlipidemia, unspecified: Secondary | ICD-10-CM | POA: Diagnosis not present

## 2021-06-26 DIAGNOSIS — I2510 Atherosclerotic heart disease of native coronary artery without angina pectoris: Secondary | ICD-10-CM | POA: Insufficient documentation

## 2021-06-26 DIAGNOSIS — I252 Old myocardial infarction: Secondary | ICD-10-CM | POA: Insufficient documentation

## 2021-06-26 LAB — BASIC METABOLIC PANEL
Anion gap: 4 — ABNORMAL LOW (ref 5–15)
BUN: 21 mg/dL (ref 8–23)
CO2: 28 mmol/L (ref 22–32)
Calcium: 9.4 mg/dL (ref 8.9–10.3)
Chloride: 106 mmol/L (ref 98–111)
Creatinine, Ser: 1.06 mg/dL (ref 0.61–1.24)
GFR, Estimated: >60 mL/min (ref >60)
Glucose, Bld: 114 mg/dL — ABNORMAL HIGH (ref 70–99)
Potassium: 4 mmol/L (ref 3.5–5.1)
Sodium: 138 mmol/L (ref 135–145)

## 2021-06-26 LAB — CBC WITH DIFFERENTIAL/PLATELET
Abs Immature Granulocytes: 0.02 10*3/uL (ref 0.00–0.07)
Basophils Absolute: 0 10*3/uL (ref 0.0–0.1)
Basophils Relative: 0 %
Eosinophils Absolute: 0.1 10*3/uL (ref 0.0–0.5)
Eosinophils Relative: 2 %
HCT: 41.7 % (ref 39.0–52.0)
Hemoglobin: 14.1 g/dL (ref 13.0–17.0)
Immature Granulocytes: 0 %
Lymphocytes Relative: 25 %
Lymphs Abs: 1.5 10*3/uL (ref 0.7–4.0)
MCH: 28.6 pg (ref 26.0–34.0)
MCHC: 33.8 g/dL (ref 30.0–36.0)
MCV: 84.6 fL (ref 80.0–100.0)
Monocytes Absolute: 0.4 10*3/uL (ref 0.1–1.0)
Monocytes Relative: 7 %
Neutro Abs: 4 10*3/uL (ref 1.7–7.7)
Neutrophils Relative %: 66 %
Platelets: 179 10*3/uL (ref 150–400)
RBC: 4.93 MIL/uL (ref 4.22–5.81)
RDW: 12.8 % (ref 11.5–15.5)
WBC: 6.1 10*3/uL (ref 4.0–10.5)
nRBC: 0 % (ref 0.0–0.2)

## 2021-06-26 LAB — PROTIME-INR
INR: 1 (ref 0.8–1.2)
Prothrombin Time: 13.1 seconds (ref 11.4–15.2)

## 2021-06-26 MED ORDER — FENTANYL CITRATE (PF) 100 MCG/2ML IJ SOLN
INTRAMUSCULAR | Status: AC
  Filled 2021-06-26: qty 2

## 2021-06-26 MED ORDER — FENTANYL CITRATE (PF) 100 MCG/2ML IJ SOLN
INTRAMUSCULAR | Status: AC | PRN
  Administered 2021-06-26 (×2): 25 ug via INTRAVENOUS

## 2021-06-26 MED ORDER — MIDAZOLAM HCL 2 MG/2ML IJ SOLN
INTRAMUSCULAR | Status: AC | PRN
  Administered 2021-06-26 (×2): 1 mg via INTRAVENOUS

## 2021-06-26 MED ORDER — MIDAZOLAM HCL 2 MG/2ML IJ SOLN
INTRAMUSCULAR | Status: AC
  Filled 2021-06-26: qty 4

## 2021-06-26 MED ORDER — SODIUM CHLORIDE 0.9 % IV SOLN: INTRAVENOUS | Status: DC

## 2021-06-26 NOTE — Progress Notes (Signed)
Patient allowed up to stand to urinate with MD approval. Patient steady, no dizziness or discomfort when standing. MD ordered to discharge patient now.

## 2021-06-26 NOTE — Consult Note (Addendum)
Chief Complaint: Patient was seen in consultation today for  image guided left renal mass biopsy  Referring Physician(s): Hollice Espy  Supervising Physician: Juliet Rude  Patient Status: [facility] - Out-pt  History of Present Illness: David Mccullough is a 75 y.o. male, ex smoker,  with past medical history significant for coronary artery disease with prior MIs/prior CABG, depression, elevated PSA, hypertension, hyperlipidemia, atrial fibrillation, urinary urgency/incomplete bladder emptying/microscopic hematuria and recent imaging studies revealing solitary right lower lobe pulmonary nodule, 4.7 cm enhancing left lower pole renal mass, bilateral nonobstructive renal calculi, tiny bladder calculi, enlarged prostate.  He presents today for image guided left renal mass biopsy for further evaluation.  Patient is also scheduled for TURBT on 07/28/2021 with Dr. Erlene Quan.  Cystoscopy on 06/05/21 revealed a 1.5 cm relatively flat papillary tumor lateral to the left UO consistent with TCC.  Past Medical History:  Diagnosis Date   CAD (coronary artery disease)    Colon polyps    Depression    Elevated PSA    Hyperlipidemia    Hypertension    Myocardial infarction (HCC)    x 2   Persistent atrial fibrillation (HCC)    Tachycardia     Past Surgical History:  Procedure Laterality Date   CARDIAC ELECTROPHYSIOLOGY STUDY AND ABLATION     patient states due to V-tac   CORONARY ARTERY BYPASS GRAFT N/A 03/22/2018   Procedure: CORONARY ARTERY BYPASS GRAFTING (CABG) x 3 WITH ENDOSCOPIC HARVESTING OF RIGHT GREATER SAPHENOUS VEIN;  Surgeon: Ivin Poot, MD;  Location: [facility];  Service: Open Heart Surgery;  Laterality: N/A;   CORONARY STENT INTERVENTION N/A 03/16/2018   Procedure: CORONARY STENT INTERVENTION;  Surgeon: Yolonda Kida, MD;  Location: [facility];  Service: Cardiovascular;  Laterality: N/A;   heart bypass     LEFT ATRIAL APPENDAGE OCCLUSION N/A 03/22/2018   Procedure:  LEFT ATRIAL APPENDAGE OCCLUSION USING 40 MM ATRICURE ATRICLIP;  Surgeon: Ivin Poot, MD;  Location: [facility];  Service: Open Heart Surgery;  Laterality: N/A;   LEFT HEART CATH AND CORONARY ANGIOGRAPHY N/A 03/15/2018   Procedure: LEFT HEART CATH AND CORONARY ANGIOGRAPHY and PCI stent;  Surgeon: Yolonda Kida, MD;  Location: [facility];  Service: Cardiovascular;  Laterality: N/A;   TEE WITHOUT CARDIOVERSION N/A 03/22/2018   Procedure: TRANSESOPHAGEAL ECHOCARDIOGRAM (TEE);  Surgeon: Prescott Gum, Collier Salina, MD;  Location: [facility];  Service: Open Heart Surgery;  Laterality: N/A;    Allergies: Patient has no known allergies.  Medications: Prior to Admission medications   Medication Sig Start Date End Date Taking? Authorizing Provider  atorvastatin (LIPITOR) 80 MG tablet TAKE 1 TABLET(80 MG) BY MOUTH DAILY AT 6 PM 09/07/18   Bensimhon, Shaune Pascal, MD  finasteride (PROSCAR) 5 MG tablet Take 1 tablet (5 mg total) by mouth daily. 06/05/21   Hollice Espy, MD  tamsulosin (FLOMAX) 0.4 MG CAPS capsule Take 1 capsule (0.4 mg total) by mouth daily. 06/05/21   Hollice Espy, MD  traZODone (DESYREL) 50 MG tablet TAKE 1 TABLET(50 MG) BY MOUTH AT BEDTIME 08/14/20   Juline Patch, MD  Vibegron (GEMTESA) 75 MG TABS Take 75 mg by mouth daily. 09/25/20   Hollice Espy, MD     Family History  Problem Relation Age of Onset   Heart disease Mother    Heart disease Father    Arrhythmia Sister    Heart disease Sister     Social History   Socioeconomic History   Marital status:  Married    Spouse name: Not on file   Number of children: 3   Years of education: Not on file   Highest education level: Not on file  Occupational History   Not on file  Tobacco Use   Smoking status: Former    Packs/day: 1.00    Years: 25.00    Pack years: 25.00    Types: Cigarettes    Quit date: 06/30/1987    Years since quitting: 34.0   Smokeless tobacco: Never  Vaping Use   Vaping Use: Never used  Substance and  Sexual Activity   Alcohol use: Not Currently   Drug use: Not Currently   Sexual activity: Not Currently  Other Topics Concern   Not on file  Social History Narrative   Not on file   Social Determinants of Health   Financial Resource Strain: Low Risk    Difficulty of Paying Living Expenses: Not hard at all  Food Insecurity: No Food Insecurity   Worried About Charity fundraiser in the Last Year: Never true   [place] in the Last Year: Never true  Transportation Needs: No Transportation Needs   Lack of Transportation (Medical): No   Lack of Transportation (Non-Medical): No  Physical Activity: Inactive   Days of Exercise per Week: 0 days   Minutes of Exercise per Session: 0 min  Stress: No Stress Concern Present   Feeling of Stress : Only a little  Social Connections: Moderately Integrated   Frequency of Communication with Friends and Family: More than three times a week   Frequency of Social Gatherings with Friends and Family: More than three times a week   Attends Religious Services: More than 4 times per year   Active Member of [employer] or Organizations: No   Attends Archivist Meetings: Never   Marital Status: Married      Review of Systems currently denies fever, headache, chest pain, dyspnea, cough, abdominal/back pain, nausea, vomiting or visible bleeding.  Vital Signs:pending    Physical Exam awake, alert.  Chest with distant breath sounds bilaterally.  Occasional inspiratory wheeze.  Heart with regular rate and rhythm.  Abdomen protuberant, soft, positive bowel sounds, nontender.  No lower extremity edema.  Imaging: CT CHEST W CONTRAST  Result Date: 06/16/2021 CLINICAL DATA:  Lung nodule.  History of urologic cancer. EXAM: CT CHEST WITH CONTRAST TECHNIQUE: Multidetector CT imaging of the chest was performed during intravenous contrast administration. CONTRAST:  133mL OMNIPAQUE IOHEXOL 300 MG/ML  SOLN COMPARISON:  CT 05/27/2021 FINDINGS:  Cardiovascular: Post CABG no acute cardiovascular findings. Mediastinum/Nodes: No axillary or supraclavicular adenopathy. No mediastinal or hilar adenopathy. No pericardial fluid. Esophagus normal. Lungs/Pleura: Ovoid nodule in the RIGHT lower lobe measures 9 mm (image 109/series 3) compared to 9 mm on CT 05/27/2021. No additional pulmonary nodules are present. Airways normal Upper Abdomen: Limited view of the liver, kidneys, pancreas are unremarkable. Normal adrenal glands. Musculoskeletal: No aggressive osseous lesion. IMPRESSION: 1. Solitary RIGHT lower lobe pulmonary nodule. Indeterminate finding in patient with LEFT renal mass. 2. LEFT renal mass not imaged. 3. Post CABG anatomy. Electronically Signed   By: Suzy Bouchard M.D.   On: 06/16/2021 09:39   CT HEMATURIA WORKUP  Result Date: 05/27/2021 CLINICAL DATA:  Microscopic hematuria. Elevated PSA level. Frequent urination. EXAM: CT ABDOMEN AND PELVIS WITHOUT AND WITH CONTRAST TECHNIQUE: Multidetector CT imaging of the abdomen and pelvis was performed following the standard protocol before and following the bolus administration of intravenous contrast.  CONTRAST:  151mL OMNIPAQUE IOHEXOL 350 MG/ML SOLN COMPARISON:  None. FINDINGS: Lower chest: 9 by 6 by 8 mm (volume = 200 mm^3) solid right lower lobe nodule on image 6 series 3. 2 mm right lower lobe nodule on image 11 series 11. Scattered locules of gas in the right atrium and right ventricle on the noncontrast images; this is usually due to gas introduced from IV tubing and is typically benign/incidental as long as the patient does not have a right to left cardiac shunt. Small type 1 hiatal hernia.  Mild cardiomegaly. Hepatobiliary: Dependent gallstone in the gallbladder measuring 1.0 cm in long axis. The liver appears otherwise unremarkable. No biliary dilatation. Pancreas: Unremarkable Spleen: Unremarkable Adrenals/Urinary Tract: Both adrenal glands appear normal. 3 mm nonobstructive left kidney upper  pole calculus, image 104 series 5. 2 mm right mid kidney nonobstructive renal calculus, image 98 series 5. Faint speckled calcifications along the left posterior urinary bladder wall on images 66-68 of series 2, in the 1-2 mm range, probably small bladder calculi, I cannot exclude a tiny left UVJ calculus among these. These presumed calculi are less conspicuous on the delayed phase images probably due to being similar density to the surrounding contrast and due to the small size. Partially exophytic from the left kidney lower pole anteriorly we demonstrate a 4.1 by 3.6 by 4.7 cm enhancing mass likely with some degree of central necrosis. No internal fat density. High suspicion for renal cell carcinoma based on morphology and appearance. 8 mm hypodense lesion of the left kidney upper pole on image 27 series 9, probably a small cyst but technically too small to characterize. No additional significant filling defect is identified along the urothelium. Stomach/Bowel: Small type 1 hiatal hernia. Prominent stool throughout the colon favors constipation. Vascular/Lymphatic: Atherosclerosis is present, including aortoiliac atherosclerotic disease. No pathologic retroperitoneal adenopathy. No observed tumor thrombus in the left renal vein or IVC. Reproductive: The prostate gland measures approximately 5.8 by 5.2 by 6.3 cm (volume = 99 cm^3) compatible with prostatomegaly. Other: No supplemental non-categorized findings. Musculoskeletal: Lower lumbar spondylosis noted with degenerative disc disease at the L4-5 level. I do not observe compelling findings of osseous metastatic disease. Small umbilical hernia contains adipose tissue. IMPRESSION: 1. 4.7 cm enhancing mass of the left kidney lower pole with morphologic characteristics most compatible with renal cell carcinoma. No adenopathy or tumor thrombus in the left renal vein. 2. 9 by 6 by 8 mm solid right lower lobe nodule. Fleischner criteria do not apply given the presence  of the left renal mass. Completion imaging of the chest may be warranted in the context of staging workup. Surveillance of the dominant right lower lobe nodule is suggested. 3. Bilateral single nonobstructive renal calculi. Tiny bladder calculi are noted. 4. Prostatomegaly. 5. Other imaging findings of potential clinical significance: Small type 1 hiatal hernia. Mild cardiomegaly. Cholelithiasis. Prominent stool throughout the colon favors constipation. Aortic Atherosclerosis (ICD10-I70.0). Lower lumbar spondylosis and degenerative disc disease. Small umbilical hernia contains adipose tissue. Impression # 1 will be called to the ordering clinician or representative by the Radiologist Assistant, and communication documented in the PACS or [employer]. Electronically Signed   By: Van Clines M.D.   On: 05/27/2021 16:11    Labs:  CBC: No results for input(s): WBC, HGB, HCT, PLT in the last 8760 hours.  COAGS: No results for input(s): INR, APTT in the last 8760 hours.  BMP: Recent Labs    05/27/21 0836  CREATININE 1.20    LIVER  FUNCTION TESTS: No results for input(s): BILITOT, AST, ALT, ALKPHOS, PROT, ALBUMIN in the last 8760 hours.  TUMOR MARKERS: No results for input(s): AFPTM, CEA, CA199, CHROMGRNA in the last 8760 hours.  Assessment and Plan: 75 y.o. male , ex smoker, with past medical history significant for coronary artery disease with prior MIs/prior CABG, depression, elevated PSA, hypertension, hyperlipidemia, atrial fibrillation, microscopic hematuria and recent imaging studies revealing solitary right lower lobe pulmonary nodule, 4.7 cm enhancing left lower pole renal mass, bilateral nonobstructive renal calculi, tiny bladder calculi, enlarged prostate.  He presents today for image guided left renal mass biopsy for further evaluation.Risks and benefits of procedure was discussed with the patient  including, but not limited to bleeding, infection, damage to adjacent  structures or low yield requiring additional tests.  All of the questions were answered and there is agreement to proceed.  Consent signed and in chart.  Patient is also scheduled for TURBT on 07/28/2021 with Dr. Erlene Quan.  Cystoscopy on 06/05/21 revealed a 1.5 cm relatively flat papillary tumor lateral to the left UO consistent with TCC.  LABS PENDING  Thank you for this interesting consult.  I greatly enjoyed meeting David Mccullough and look forward to participating in their care.  A copy of this report was sent to the requesting provider on this date.  Electronically Signed: D. Rowe Robert, PA-C 06/26/2021, 8:43 AM   I spent a total of 25 minutes   in face to face in clinical consultation, greater than 50% of which was counseling/coordinating care for image guided left renal mass biopsy

## 2021-06-26 NOTE — Procedures (Signed)
Interventional Radiology Procedure Note  Date of Procedure: 06/26/2021  Procedure: US guided left renal mass biopsy   Findings:  1. US guided left renal mass biopsy 18ga x4 passes    Complications: No immediate complications noted.   Estimated Blood Loss: minimal  Follow-up and Recommendations: 1. Bedrest 6 hours    Albin Felling, MD  Vascular & Interventional Radiology  06/26/2021 10:44 AM

## 2021-07-01 ENCOUNTER — Telehealth: Payer: Self-pay | Admitting: Family Medicine

## 2021-07-01 LAB — SURGICAL PATHOLOGY

## 2021-07-01 NOTE — Telephone Encounter (Signed)
Patient's wife called wanting to know the result of the Biopsy. Please advise

## 2021-07-02 NOTE — H&P (View-Only) (Signed)
07/03/21 11:38 AM   David Mccullough 04-22-1946 [id number]  Referring provider:  Juline Patch, MD [address] [place],  [place] [address] Chief Complaint  Patient presents with   Results     HPI: David Mccullough is a 76 y.o.male with a personal history of  urinary urgency, incomplete bladder emptying, elevated PSA, and microscopic hematuria, who presents today for pathology results.   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 No adenopathy or tumor thrombus in the left renal vein. 9 x 6 x 8 mm solid right lower lobe nodule. Fleischner criteria do not apply given the presence of the left renal mass. Bilateral single nonobstructive renal calculi. Tiny bladder calculi are noted and prostatomegaly.  He underwent a chest CT on 06/13/2021 that revealed solitary right lower lobe pulmonary nodule indeterminate finding in patient with left renal mass.   He is s/p renal biopsy on 06/26/2021. Surgical pathology revealed renal cell carcinoma, conventional clear-cell type, nuclear grade 2.    PMH: Past Medical History:  Diagnosis Date   CAD (coronary artery disease)    Colon polyps    Depression    Elevated PSA    Hyperlipidemia    Hypertension    Myocardial infarction (HCC)    x 2   Persistent atrial fibrillation (HCC)    Tachycardia     Surgical History: Past Surgical History:  Procedure Laterality Date   CARDIAC ELECTROPHYSIOLOGY STUDY AND ABLATION     patient states due to V-tac   CORONARY ARTERY BYPASS GRAFT N/A 03/22/2018   Procedure: CORONARY ARTERY BYPASS GRAFTING (CABG) x 3 WITH ENDOSCOPIC HARVESTING OF RIGHT GREATER SAPHENOUS VEIN;  Surgeon: Ivin Poot, MD;  Location: [facility];  Service: Open Heart Surgery;  Laterality: N/A;   CORONARY STENT INTERVENTION N/A 03/16/2018   Procedure: CORONARY STENT INTERVENTION;  Surgeon:  Yolonda Kida, MD;  Location: [facility];  Service: Cardiovascular;  Laterality: N/A;   heart bypass     LEFT ATRIAL APPENDAGE OCCLUSION N/A 03/22/2018   Procedure: LEFT ATRIAL APPENDAGE OCCLUSION USING 40 MM ATRICURE ATRICLIP;  Surgeon: Ivin Poot, MD;  Location: [facility];  Service: Open Heart Surgery;  Laterality: N/A;   LEFT HEART CATH AND CORONARY ANGIOGRAPHY N/A 03/15/2018   Procedure: LEFT HEART CATH AND CORONARY ANGIOGRAPHY and PCI stent;  Surgeon: Yolonda Kida, MD;  Location: [facility];  Service: Cardiovascular;  Laterality: N/A;   TEE WITHOUT CARDIOVERSION N/A 03/22/2018   Procedure: TRANSESOPHAGEAL ECHOCARDIOGRAM (TEE);  Surgeon: Prescott Gum, Collier Salina, MD;  Location: [facility];  Service: Open Heart Surgery;  Laterality: N/A;    Home Medications:  Allergies as of 07/03/2021   No Known Allergies      Medication List        Accurate as of July 03, 2021 11:38 AM. If you have any questions, ask your nurse or doctor.          aspirin EC 81 MG tablet Take 81 mg by mouth daily. Swallow whole.   atorvastatin 80 MG tablet Commonly known as: LIPITOR TAKE 1 TABLET(80 MG) BY MOUTH DAILY AT 6 PM   CALCIUM PO Take 1 tablet by mouth daily.   finasteride 5 MG tablet Commonly known as: PROSCAR Take 1 tablet (5 mg total) by mouth daily.   FISH OIL PO Take 1 tablet by mouth daily.  Gemtesa 75 MG Tabs Generic drug: Vibegron Take 75 mg by mouth daily.   multivitamin with minerals tablet Take 1 tablet by mouth daily.   tamsulosin 0.4 MG Caps capsule Commonly known as: FLOMAX Take 1 capsule (0.4 mg total) by mouth daily.   traZODone 50 MG tablet Commonly known as: DESYREL TAKE 1 TABLET(50 MG) BY MOUTH AT BEDTIME        Allergies: No Known Allergies  Family History: Family History  Problem Relation Age of Onset   Heart disease Mother    Heart disease Father    Arrhythmia Sister    Heart disease Sister     Social History:  reports that  he quit smoking about 34 years ago. His smoking use included cigarettes. He has a 25.00 pack-year smoking history. He has never used smokeless tobacco. He reports that he does not currently use alcohol. He reports that he does not currently use drugs.   Physical Exam: BP 129/77    Pulse 80    Ht 6' (1.829 m)    Wt 210 lb (95.3 kg)    BMI 28.48 kg/m   Constitutional:  Alert and oriented, No acute distress. HEENT: [place] AT, moist mucus membranes.  Trachea midline, no masses. Cardiovascular: No clubbing, cyanosis, or edema. Respiratory: Normal respiratory effort, no increased work of breathing. Skin: No rashes, bruises or suspicious lesions. Neurologic: Grossly intact, no focal deficits, moving all 4 extremities. Psychiatric: Normal mood and affect.  Laboratory Data:  Lab Results  Component Value Date   CREATININE 1.06 06/26/2021     Assessment & Plan:    Renal mass  - A solid renal mass raises the suspicion of primary renal malignancy.  We discussed this in detail and in regards to the spectrum of renal masses which includes cysts (pure cysts are considered benign), solid masses and everything in between. The risk of metastasis increases as the size of solid renal mass increases. In general, it is believed that the risk of metastasis for renal masses less than 3-4 cm is small (up to approximately 5%) based mainly on large retrospective studies. In some cases and especially in patients of older age and multiple comorbidities a surveillance approach may be appropriate. The treatment of solid renal masses includes: surveillance, cryoablation (percutaneous and laparoscopic) in addition to partial and complete nephrectomy (each with option of laparoscopic, robotic and open depending on appropriateness).  - Recommend radical nephrectomy following TURBT based on the very endophytic nature of his mass, midpole location as it does not appear easily amenable to partial nephrectomy. -He also has a  nonspecific pulmonary nodule, solitary.  We discussed today that this could be benign, a third primary, or possibly a metastatic lesion from either of his other 2 newly diagnosed malignancies.  If it does represent metastatic disease, based on the general appearance of his bladder lesion, suspect this would probably be from his kidney.  I recommended that we proceed with recommendations from multidisciplinary tumor board whether or not she ought to undergo PET scan or further biopsy of the pulmonary lesion.  They are open to this.  I will reach back out to the patient next week after his case has been presented to arrange for follow-up studies he had poor diagnostic intervention.  They understand that if this does represent metastatic disease, whether or not to pursue nephrectomy at that point in time we depend on many factors which would need to be further considered.  2. Bladder cancer TCC  - Cystoscopy showed  1.5 cm relatively flat papillary tumor lateral to the left UO consisted with TCC.  - TURBT scheduled   3.Pulmonary nodule/chronic cough  - Incidental  pulmonary nodule, see above    I,Kailey Littlejohn,acting as a scribe for Hollice Espy, MD.,have documented all relevant documentation on the behalf of Hollice Espy, MD,as directed by  Hollice Espy, MD while in the presence of Hollice Espy, MD.  I have reviewed the above documentation for accuracy and completeness, and I agree with the above.   Hollice Espy, MD  [facility] [address], [place], [place] [address] [phone]  I spent 42 total minutes on the day of the encounter including pre-visit review of the medical record, face-to-face time with the patient, and post visit ordering of labs/imaging/tests.  The majority of this time was spent discussing the patient's newly diagnosed kidney cancer, implications of this, possible surgical intervention needed for this as well as further  diagnostic work-up.

## 2021-07-02 NOTE — Progress Notes (Signed)
07/03/21 11:38 AM   David Mccullough 04/08/1946 [id number]  Referring provider:  Juline Patch, MD [address] [place],   [address] Chief Complaint  Patient presents with   Results     HPI: David Mccullough is a 76 y.o.male with a personal history of  urinary urgency, incomplete bladder emptying, elevated PSA, and microscopic hematuria, who presents today for pathology results.   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 No adenopathy or tumor thrombus in the left renal vein. 9 x 6 x 8 mm solid right lower lobe nodule. Fleischner criteria do not apply given the presence of the left renal mass. Bilateral single nonobstructive renal calculi. Tiny bladder calculi are noted and prostatomegaly.  He underwent a chest CT on 06/13/2021 that revealed solitary right lower lobe pulmonary nodule indeterminate finding in patient with left renal mass.   He is s/p renal biopsy on 06/26/2021. Surgical pathology revealed renal cell carcinoma, conventional clear-cell type, nuclear grade 2.    PMH: Past Medical History:  Diagnosis Date   CAD (coronary artery disease)    Colon polyps    Depression    Elevated PSA    Hyperlipidemia    Hypertension    Myocardial infarction (HCC)    x 2   Persistent atrial fibrillation (HCC)    Tachycardia     Surgical History: Past Surgical History:  Procedure Laterality Date   CARDIAC ELECTROPHYSIOLOGY STUDY AND ABLATION     patient states due to V-tac   CORONARY ARTERY BYPASS GRAFT N/A 03/22/2018   Procedure: CORONARY ARTERY BYPASS GRAFTING (CABG) x 3 WITH ENDOSCOPIC HARVESTING OF RIGHT GREATER SAPHENOUS VEIN;  Surgeon: Ivin Poot, MD;  Location: [facility];  Service: Open Heart Surgery;  Laterality: N/A;   CORONARY STENT INTERVENTION N/A 03/16/2018   Procedure: CORONARY STENT INTERVENTION;  Surgeon:  Yolonda Kida, MD;  Location: [facility];  Service: Cardiovascular;  Laterality: N/A;   heart bypass     LEFT ATRIAL APPENDAGE OCCLUSION N/A 03/22/2018   Procedure: LEFT ATRIAL APPENDAGE OCCLUSION USING 40 MM ATRICURE ATRICLIP;  Surgeon: Ivin Poot, MD;  Location: [facility];  Service: Open Heart Surgery;  Laterality: N/A;   LEFT HEART CATH AND CORONARY ANGIOGRAPHY N/A 03/15/2018   Procedure: LEFT HEART CATH AND CORONARY ANGIOGRAPHY and PCI stent;  Surgeon: Yolonda Kida, MD;  Location: [facility];  Service: Cardiovascular;  Laterality: N/A;   TEE WITHOUT CARDIOVERSION N/A 03/22/2018   Procedure: TRANSESOPHAGEAL ECHOCARDIOGRAM (TEE);  Surgeon: Prescott Gum, Collier Salina, MD;  Location: [facility];  Service: Open Heart Surgery;  Laterality: N/A;    Home Medications:  Allergies as of 07/03/2021   No Known Allergies      Medication List        Accurate as of July 03, 2021 11:38 AM. If you have any questions, ask your nurse or doctor.          aspirin EC 81 MG tablet Take 81 mg by mouth daily. Swallow whole.   atorvastatin 80 MG tablet Commonly known as: LIPITOR TAKE 1 TABLET(80 MG) BY MOUTH DAILY AT 6 PM   CALCIUM PO Take 1 tablet by mouth daily.   finasteride 5 MG tablet Commonly known as: PROSCAR Take 1 tablet (5 mg total) by mouth daily.   FISH OIL PO Take 1 tablet by mouth daily.  Gemtesa 75 MG Tabs Generic drug: Vibegron Take 75 mg by mouth daily.   multivitamin with minerals tablet Take 1 tablet by mouth daily.   tamsulosin 0.4 MG Caps capsule Commonly known as: FLOMAX Take 1 capsule (0.4 mg total) by mouth daily.   traZODone 50 MG tablet Commonly known as: DESYREL TAKE 1 TABLET(50 MG) BY MOUTH AT BEDTIME        Allergies: No Known Allergies  Family History: Family History  Problem Relation Age of Onset   Heart disease Mother    Heart disease Father    Arrhythmia Sister    Heart disease Sister     Social History:  reports that  he quit smoking about 34 years ago. His smoking use included cigarettes. He has a 25.00 pack-year smoking history. He has never used smokeless tobacco. He reports that he does not currently use alcohol. He reports that he does not currently use drugs.   Physical Exam: BP 129/77    Pulse 80    Ht 6' (1.829 m)    Wt 210 lb (95.3 kg)    BMI 28.48 kg/m   Constitutional:  Alert and oriented, No acute distress. HEENT: [place] AT, moist mucus membranes.  Trachea midline, no masses. Cardiovascular: No clubbing, cyanosis, or edema. Respiratory: Normal respiratory effort, no increased work of breathing. Skin: No rashes, bruises or suspicious lesions. Neurologic: Grossly intact, no focal deficits, moving all 4 extremities. Psychiatric: Normal mood and affect.  Laboratory Data:  Lab Results  Component Value Date   CREATININE 1.06 06/26/2021     Assessment & Plan:    Renal mass  - A solid renal mass raises the suspicion of primary renal malignancy.  We discussed this in detail and in regards to the spectrum of renal masses which includes cysts (pure cysts are considered benign), solid masses and everything in between. The risk of metastasis increases as the size of solid renal mass increases. In general, it is believed that the risk of metastasis for renal masses less than 3-4 cm is small (up to approximately 5%) based mainly on large retrospective studies. In some cases and especially in patients of older age and multiple comorbidities a surveillance approach may be appropriate. The treatment of solid renal masses includes: surveillance, cryoablation (percutaneous and laparoscopic) in addition to partial and complete nephrectomy (each with option of laparoscopic, robotic and open depending on appropriateness).  - Recommend radical nephrectomy following TURBT based on the very endophytic nature of his mass, midpole location as it does not appear easily amenable to partial nephrectomy. -He also has a  nonspecific pulmonary nodule, solitary.  We discussed today that this could be benign, a third primary, or possibly a metastatic lesion from either of his other 2 newly diagnosed malignancies.  If it does represent metastatic disease, based on the general appearance of his bladder lesion, suspect this would probably be from his kidney.  I recommended that we proceed with recommendations from multidisciplinary tumor board whether or not she ought to undergo PET scan or further biopsy of the pulmonary lesion.  They are open to this.  I will reach back out to the patient next week after his case has been presented to arrange for follow-up studies he had poor diagnostic intervention.  They understand that if this does represent metastatic disease, whether or not to pursue nephrectomy at that point in time we depend on many factors which would need to be further considered.  2. Bladder cancer TCC  - Cystoscopy showed  1.5 cm relatively flat papillary tumor lateral to the left UO consisted with TCC.  - TURBT scheduled   3.Pulmonary nodule/chronic cough  - Incidental  pulmonary nodule, see above    I,Kailey Littlejohn,acting as a scribe for Hollice Espy, MD.,have documented all relevant documentation on the behalf of Hollice Espy, MD,as directed by  Hollice Espy, MD while in the presence of Hollice Espy, MD.  I have reviewed the above documentation for accuracy and completeness, and I agree with the above.   Hollice Espy, MD  [facility] [address] [place], [place] [address] [phone]  I spent 42 total minutes on the day of the encounter including pre-visit review of the medical record, face-to-face time with the patient, and post visit ordering of labs/imaging/tests.  The majority of this time was spent discussing the patient's newly diagnosed kidney cancer, implications of this, possible surgical intervention needed for this as well as further  diagnostic work-up.

## 2021-07-03 ENCOUNTER — Other Ambulatory Visit: Payer: Self-pay

## 2021-07-03 ENCOUNTER — Ambulatory Visit (INDEPENDENT_AMBULATORY_CARE_PROVIDER_SITE_OTHER): Payer: Medicare Other | Admitting: Urology

## 2021-07-03 VITALS — BP 129/77 | HR 80 | Ht 72.0 in | Wt 210.0 lb

## 2021-07-03 DIAGNOSIS — C642 Malignant neoplasm of left kidney, except renal pelvis: Secondary | ICD-10-CM

## 2021-07-03 DIAGNOSIS — D494 Neoplasm of unspecified behavior of bladder: Secondary | ICD-10-CM

## 2021-07-03 DIAGNOSIS — R911 Solitary pulmonary nodule: Secondary | ICD-10-CM | POA: Diagnosis not present

## 2021-07-03 HISTORY — DX: Neoplasm of unspecified behavior of bladder: D49.4

## 2021-07-10 ENCOUNTER — Other Ambulatory Visit: Payer: Medicare Other

## 2021-07-10 NOTE — Progress Notes (Signed)
Case not discussed , moved to discuss next week

## 2021-07-14 ENCOUNTER — Inpatient Hospital Stay: Admission: RE | Admit: 2021-07-14 | Payer: Medicare Other | Source: Ambulatory Visit

## 2021-07-16 ENCOUNTER — Other Ambulatory Visit: Payer: Self-pay | Admitting: Family Medicine

## 2021-07-16 DIAGNOSIS — G4700 Insomnia, unspecified: Secondary | ICD-10-CM

## 2021-07-16 MED ORDER — TRAZODONE HCL 50 MG PO TABS: ORAL_TABLET | ORAL | 0 refills | Status: DC

## 2021-07-16 NOTE — Telephone Encounter (Signed)
Copied from [place] [id number]. Topic: Quick Communication - Rx Refill/Question >> Jul 16, 2021 12:17 PM Loma Boston wrote: Medication Refill - Medication: traZODone (DESYREL) 50 MG tablet 90 tablet 2 08/14/2020   Sig: TAKE 1 TABLET(50 MG) BY MOUTH AT BEDTIME  Sent to pharmacy as: traZODone (DESYREL) 50 MG tablet  E-Prescribing Status: Receipt confirmed by pharmacy (08/14/2020 9:46 AM EST)     Has the patient contacted their pharmacy? Yes.   (Agent: If no, request that the patient contact the pharmacy for the refill. If patient does not wish to contact the pharmacy document the reason why and proceed with request.) (Agent: If yes, when and what did the pharmacy advise?) call dr  Preferred Pharmacy (with phone number or street name): [facility], [place] [address] [place] [address] Phone: [phone] Fax: [phone] Hours: Not open 24 hours   Has the patient been seen for an appointment in the last year OR does the patient have an upcoming appointment? No.  Agent: Please be advised that RX refills may take up to 3 business days. We ask that you follow-up with your pharmacy.

## 2021-07-16 NOTE — Addendum Note (Signed)
Addended by: Matilde Sprang on: 07/16/2021 05:23 PM   Modules accepted: Orders

## 2021-07-16 NOTE — Telephone Encounter (Signed)
Courtesy refill given, appointment needed for additional refills.  Requested Prescriptions  Pending Prescriptions Disp Refills   traZODone (DESYREL) 50 MG tablet 30 tablet 0    Sig: TAKE 1 TABLET(50 MG) BY MOUTH AT BEDTIMETAKE 1 TABLET(50 MG) BY MOUTH AT BEDTIME Strength: 50 mg  OFFICE VISIT NEEDED FOR ADDITIONAL REFILLS     Psychiatry: Antidepressants - Serotonin Modulator Failed - 07/16/2021  4:16 PM      Failed - Valid encounter within last 6 months    Recent Outpatient Visits          11 months ago Insomnia, unspecified type   [facility] Juline Patch, MD   1 year ago Insomnia, unspecified type   [facility] Juline Patch, MD   2 years ago Insomnia, unspecified type   [facility] Juline Patch, MD   2 years ago Elevated PSA   [facility] Juline Patch, MD   3 years ago Establishing care with new doctor, encounter for   [place], MD      Future Appointments            In 3 weeks Juline Patch, MD [facility], [place]   In 1 month Hollice Espy, MD [facility]   In 1 month Hollice Espy, [place]   In [place] months Hollice Espy, [place]

## 2021-07-17 ENCOUNTER — Telehealth: Payer: Self-pay | Admitting: Urology

## 2021-07-17 ENCOUNTER — Other Ambulatory Visit: Payer: Medicare Other

## 2021-07-17 DIAGNOSIS — D494 Neoplasm of unspecified behavior of bladder: Secondary | ICD-10-CM

## 2021-07-17 DIAGNOSIS — R911 Solitary pulmonary nodule: Secondary | ICD-10-CM

## 2021-07-17 DIAGNOSIS — C642 Malignant neoplasm of left kidney, except renal pelvis: Secondary | ICD-10-CM

## 2021-07-17 NOTE — Progress Notes (Signed)
Tumor Board Documentation  David Mccullough was presented by DrBrandon at our Tumor Board on 07/17/2021, which included representatives from medical oncology, pathology, radiology, surgical, pharmacy, pulmonology, genetics, nutrition, navigation, research, internal medicine, palliative care.  David Mccullough currently presents as an external consult, for [place], for new positive pathology with history of the following treatments: surgical intervention(s), active survellience.  Additionally, we reviewed previous medical and familial history, history of present illness, and recent lab results along with all available histopathologic and imaging studies. The tumor board considered available treatment options and made the following recommendations: Additional screening, Active surveillance, Surgery (PET Scan, TURBT, Nephrectomy) Refer to Medical Oncology or Pulmonology to follow Lung Nodule can offer Immunotherapy vs observation depending on PET results  The following procedures/referrals were also placed: No orders of the defined types were placed in this encounter.   Clinical Trial Status: not discussed   Staging used: AJCC Stage Group AJCC Staging:       Group: Bladder Cancer, Clear Cell Renal Cancer, Lung Nodule   National site-specific guidelines NCCN were discussed with respect to the case.  Tumor board is a meeting of clinicians from various specialty areas who evaluate and discuss patients for whom a multidisciplinary approach is being considered. Final determinations in the plan of care are those of the provider(s). The responsibility for follow up of recommendations given during tumor board is that of the provider.   Todays extended care, comprehensive team conference, David Mccullough was not present for the discussion and was not examined.   Multidisciplinary Tumor Board is a multidisciplinary case peer review process.  Decisions discussed in the Multidisciplinary Tumor Board reflect the opinions of  the specialists present at the conference without having examined the patient.  Ultimately, treatment and diagnostic decisions rest with the primary provider(s) and the patient.

## 2021-07-17 NOTE — Telephone Encounter (Signed)
Case was presented to tumor board today (different from last week).  Consensus was to go ahead and proceed with PET scan of the utmost precaution and if the lesion does not light up, would likely just follow with serial imaging.  I have ordered the PET scan.  Hollice Espy, MD

## 2021-07-18 ENCOUNTER — Encounter
Admission: RE | Admit: 2021-07-18 | Discharge: 2021-07-18 | Disposition: A | Discharge status: Home / Self Care | Payer: Medicare Other | Source: Ambulatory Visit | Attending: Urology | Admitting: Urology

## 2021-07-18 ENCOUNTER — Other Ambulatory Visit: Payer: Self-pay

## 2021-07-18 HISTORY — DX: Malignant neoplasm of bladder, unspecified: C67.9

## 2021-07-18 HISTORY — DX: Malignant neoplasm of unspecified kidney, except renal pelvis: C64.9

## 2021-07-18 HISTORY — DX: Gastro-esophageal reflux disease without esophagitis: K21.9

## 2021-07-18 NOTE — Patient Instructions (Signed)
Your procedure is scheduled on:07-28-21 Monday Report to the Registration Desk on the 1st floor of the [facility].Then proceed to the 2nd floor Surgery Desk in the [facility] To find out your arrival time, please call [phone] between 1PM - 3PM on:07-25-21 Friday  REMEMBER: Instructions that are not followed completely may result in serious medical risk, up to and including death; or upon the discretion of your surgeon and anesthesiologist your surgery may need to be rescheduled.  Do not eat food OR drink liquids after midnight the night before surgery.  No gum chewing, lozengers or hard candies.  TAKE THESE MEDICATIONS THE MORNING OF SURGERY WITH A SIP OF WATER: -atorvastatin (LIPITOR)  -finasteride (PROSCAR) -tamsulosin (FLOMAX) -Vibegron (GEMTESA)   Continue your aspirin EC 81 MG as instructed by Dr Audree Bane office-Do NOT take the morning of surgery  One week prior to surgery: Stop Anti-inflammatories (NSAIDS) such as Advil, Aleve, Ibuprofen, Motrin, Naproxen, Naprosyn and Aspirin based products such as Excedrin, Goodys Powder, BC Powder.You may however, take Tylenol if needed for pain up until the day of surgery.  Stop ANY OVER THE COUNTER supplements/vitamins 7 days prior to surgery (CALCIUM, Multiple Vitamin, Omega-3 Fatty Acids (FISH OIL)  No Alcohol for 24 hours before or after surgery.  No Smoking including e-cigarettes for 24 hours prior to surgery.  No chewable tobacco products for at least 6 hours prior to surgery.  No nicotine patches on the day of surgery.  Do not use any "recreational" drugs for at least a week prior to your surgery.  Please be advised that the combination of cocaine and anesthesia may have negative outcomes, up to and including death. If you test positive for cocaine, your surgery will be cancelled.  On the morning of surgery brush your teeth with toothpaste and water, you may rinse your mouth with mouthwash if you wish. Do not swallow any  toothpaste or mouthwash  Do not wear jewelry, make-up, hairpins, clips or nail polish.  Do not wear lotions, powders, or perfumes.   Do not shave body from the neck down 48 hours prior to surgery just in case you cut yourself which could leave a site for infection.   Contact lenses, hearing aids and dentures may not be worn into surgery.  Do not bring valuables to the hospital. [facility] is not responsible for any missing/lost belongings or valuables.   Notify your doctor if there is any change in your medical condition (cold, fever, infection).  Wear comfortable clothing (specific to your surgery type) to the hospital.  After surgery, you can help prevent lung complications by doing breathing exercises.  Take deep breaths and cough every 1-2 hours. Your doctor may order a device called an Incentive Spirometer to help you take deep breaths. When coughing or sneezing, hold a pillow firmly against your incision with both hands. This is called splinting. Doing this helps protect your incision. It also decreases belly discomfort.  If you are being admitted to the hospital overnight, leave your suitcase in the car. After surgery it may be brought to your room.  If you are being discharged the day of surgery, you will not be allowed to drive home. You will need a responsible adult (18 years or older) to drive you home and stay with you that night.   If you are taking public transportation, you will need to have a responsible adult (18 years or older) with you. Please confirm with your physician that it is acceptable to use  public transportation.   Please call the [facility] at [phone] if you have any questions about these instructions.  Surgery Visitation Policy:  Patients undergoing a surgery or procedure may have one family member or support person with them as long as that person is not COVID-19 positive or experiencing its symptoms.  That person may remain  in the waiting area during the procedure and may rotate out with other people.  Inpatient Visitation:    Visiting hours are 7 a.m. to 8 p.m. Up to two visitors ages 16+ are allowed at one time in a patient room. The visitors may rotate out with other people during the day. Visitors must check out when they leave, or other visitors will not be allowed. One designated support person may remain overnight. The visitor must pass COVID-19 screenings, use hand sanitizer when entering and exiting the patients room and wear a mask at all times, including in the patients room. Patients must also wear a mask when staff or their visitor are in the room. Masking is required regardless of vaccination status.

## 2021-07-21 NOTE — Telephone Encounter (Signed)
Patient informed, voiced understanding.  °

## 2021-07-22 ENCOUNTER — Encounter: Payer: Self-pay | Admitting: Urology

## 2021-07-22 NOTE — Progress Notes (Signed)
Perioperative Services  Pre-Admission/Anesthesia Testing Clinical Review  Date: 07/22/21  Patient Demographics:  Name: David Mccullough DOB:   1945-10-22 MRN:   [id number]  Planned Surgical Procedure(s):    Case: [id number] Date/Time: 07/28/21 1045   Procedure: TRANSURETHRAL RESECTION OF BLADDER TUMOR (TURBT)WITH  GEMCITABINE   Anesthesia type: General   Pre-op diagnosis: BLADDER TUMOR   Location: [place] OR ROOM 10 / [place] [facility]   Surgeons: Hollice Espy, MD   NOTE: Available [name] nursing documentation and vital signs have been reviewed. Clinical nursing staff has updated patient's PMH/PSHx, current medication list, and drug allergies/intolerances to ensure comprehensive history available to assist in medical decision making as it pertains to the aforementioned surgical procedure and anticipated anesthetic course. Extensive review of available clinical information performed. [place] PMH and PSHx updated with any diagnoses/procedures that  may have been inadvertently omitted during his intake with the pre-admission testing department's nursing staff.  Clinical Discussion:  David Mccullough is a 76 y.o. male who is submitted for pre-surgical anesthesia review and clearance prior to him undergoing the above procedure. Patient is a Former Smoker (25 pack years; quit 06/1987). Pertinent PMH includes: CAD (s/p CABG), NSTEMI, atrial fibrillation, SVT (s/p ablation), aortic atherosclerosis, HTN, HLD, RIGHT solitary pulmonary nodule, GERD (uses CaCO3 tablets PRN), papillary bladder tumor, LEFT renal mass (Bx proven RCC), nephrolithiasis, depression.  Patient is followed by cardiology Clayborn Bigness, MD). He was last seen in the cardiology clinic on 03/26/2021; notes reviewed. At the time of his clinic visit, the patient denied any chest pain, shortness of breath, PND, orthopnea, palpitations, significant peripheral edema, vertiginous symptoms, or presyncope/syncope.  Patient with a PMH  significant for cardiovascular diagnoses. Of note, patient formally lived outside of the state of [place], therefore records regarding history are limited. History obtained from local primary cardiologist and patient.  PMH significant for atrial flutter. He underwent an ablation procedure while living in [place] in approximately 2009. Since ablation, patient denies recurrence.   Patient suffered an NSTEMI on 03/14/2018. He underwent a diagnostic left heart catheterization on 03/15/2018 that revealed an ef of 50% with inferior hypokinesis. There was multivessel CAD noted; 80% pLAD, 99% OM1, and 100% pRCA. PCI was attempted, however failed. Initially there was TIMI-1 flow, however during interventional attempt, flow degraded to TIMI-0. Patient was transferred to [facility] for consultation with CVTS regarding high risk PCI vs. CABG.   Patient ultimately underwent a 3 vessel CABG on 03/22/2018. LIMA-LAD, SVG-OM, and SVG-RI bypass grafts were placed.   Last TTE was performed on 03/24/2018 revealing a normal left ventricular systolic function; EF 34-91%. Diastolic function parameters normal. There was mild mitral valve regurgitation. LA was moderately dilated. PASP elevated at 37 mmHg. There was no evident of a significant transvalvular gradient to suggest mitral or aortic valve stenosis.   Patient with a history of PAF; CHA2DS2-VASc Score = 4 (age x 2, HTN, prior MI). He does not require pharmacological intervention for this diagnosis; no beta blockers, antiarrhythmics, or daily anticoagulation. Patient is on a statin + omega 3 fatty aid for his HLD and further ASCVD prevention. Patient is not diabetic. Functional capacity, as defined by DASI, is documented as being >/= 4 METS. No changes were made to his medication regimen. Patient to follow up with outpatient cardiology in 1 year or sooner if needed.   [name] found to have a 4.1 x 3.6 x 4.7 cm mass located at the pole of his LEFT kidney. 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  He is therefore scheduled for a TRANSURETHRAL RESECTION OF BLADDER TUMOR WITH GEMCITABINE on 07/28/2021 with Dr. Hollice Espy, MD. Given patient's past medical history significant for cardiovascular diagnoses, presurgical cardiac clearance was sought by the [name] team. Per cardiology, "this patient is optimized for surgery and may proceed with the planned procedural course with a LOW risk of significant perioperative cardiovascular complications". This patient is on daily antiplatelet therapy. He has been instructed on recommendations for continuing his daily low dose ASA throughout the perioperative period.   Patient denies previous perioperative complications with anesthesia in the past. In review of the available records, it is noted that patient underwent a general anesthetic course at [facility] ([facility]) in 02/2018 without documented complications.   Vitals with BMI 07/03/2021 06/26/2021 06/26/2021  Height 6\' 0"  - -  Weight 210 lbs - -  BMI 48.54 - -  Systolic 627 - 035  Diastolic 77 - 91  Pulse 80 60 -    Providers/Specialists:   NOTE: Primary physician provider listed below. Patient may have been seen by APP or partner within same practice.   PROVIDER ROLE / SPECIALTY LAST Lu Duffel, MD Urology (Surgeon) 07/03/2021  Juline Patch, MD Primary Care Provider 08/14/2020  Katrine Coho, MD Cardiology 03/26/2021   Allergies:  Patient has no known allergies.  Current Home Medications:   No current facility-administered medications for this encounter.    aspirin EC 81 MG tablet   atorvastatin (LIPITOR) 80 MG tablet   CALCIUM PO   finasteride (PROSCAR) 5 MG tablet   Multiple Vitamins-Minerals (MULTIVITAMIN WITH MINERALS) tablet   Omega-3 Fatty Acids (FISH OIL PO)   tamsulosin  (FLOMAX) 0.4 MG CAPS capsule   Vibegron (GEMTESA) 75 MG TABS   calcium carbonate (TUMS) 500 MG chewable tablet   traZODone (DESYREL) 50 MG tablet   History:   Past Medical History:  Diagnosis Date   Bladder tumor 07/03/2021   a.) cystoscopy revealed 1.5 cm relatively flat papillary tumor lateral to UO; felt to be consistent with TCC   CAD (coronary artery disease) 03/15/2018   a.) NSTEMI 03/14/2018 --> LHC --> EF 50% with inferior HK; 80% pLAD, 99% OM1, 100% pRCA --> failed PCI (TIMI-1 reduced to TIMI-0); transferred to [facility] and underwent 3v CABG (LIMA-LAD, SVG-OM, SVG-RI) on 03/22/2018.   Colon polyps    Depression    Elevated PSA    GERD (gastroesophageal reflux disease)    Hyperlipidemia    Hypertension    NSTEMI (non-ST elevated myocardial infarction) ([place]) 03/14/2018   a.) LHC 03/15/2018 --> 80% pLAD, 99% OM1, 100% pRCA --> failed PCI and Tx'd to [facility] for CVTS consult; underwent 3v CABG (LIMA-LAD, SVG-OM, SVG-RI) on 03/25/2018.   Persistent atrial fibrillation (HCC)    a.) CHA2DS2-VASc = 4 (age x2, HTN, prior MI). b.) rate/rhythm maintained without pharmacological intervention; no daily anticoagulation.   Renal mass, left 05/27/2021   a.) CT urogram --> 4.7 cm mass at LEFT kidney pole. b.) Bx (+) for RCC, conventional clear cell type, nuclear grade II.   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   a.) 3v CABG: LIMA-LAD, SVG-OM, SVG-RI   SVT (supraventricular tachycardia) ([place]) 2009   a.) s/p SVT ablation while living in [place]   Past Surgical History:  Procedure Laterality Date   COLONOSCOPY     CORONARY ARTERY  BYPASS GRAFT N/A 03/22/2018   Procedure: CORONARY ARTERY BYPASS GRAFTING (CABG) x 3 WITH ENDOSCOPIC HARVESTING OF RIGHT GREATER SAPHENOUS VEIN;  Surgeon: Ivin Poot, MD;  Location: [place];  Service: Open Heart Surgery;  Laterality: N/A;   CORONARY STENT INTERVENTION N/A 03/16/2018   Procedure: CORONARY STENT INTERVENTION;  Surgeon: Yolonda Kida, MD;  Location:  [place] [facility];  Service: Cardiovascular;  Laterality: N/A;   LEFT ATRIAL APPENDAGE OCCLUSION N/A 03/22/2018   Procedure: LEFT ATRIAL APPENDAGE OCCLUSION USING 40 MM ATRICURE ATRICLIP;  Surgeon: Ivin Poot, MD;  Location: [place];  Service: Open Heart Surgery;  Laterality: N/A;   LEFT HEART CATH AND CORONARY ANGIOGRAPHY N/A 03/15/2018   Procedure: LEFT HEART CATH AND CORONARY ANGIOGRAPHY and PCI stent;  Surgeon: Yolonda Kida, MD;  Location: [place] [facility];  Service: Cardiovascular;  Laterality: N/A;   SVT ABLATION N/A 2009   Procedure: SVT ABLATION; Location: performed while living in [place].   TEE WITHOUT CARDIOVERSION N/A 03/22/2018   Procedure: TRANSESOPHAGEAL ECHOCARDIOGRAM (TEE);  Surgeon: Prescott Gum, Collier Salina, MD;  Location: [place];  Service: Open Heart Surgery;  Laterality: N/A;   Family History  Problem Relation Age of Onset   Heart disease Mother    Heart disease Father    Arrhythmia Sister    Heart disease Sister    Social History   Tobacco Use   Smoking status: Former    Packs/day: 1.00    Years: 25.00    Pack years: 25.00    Types: Cigarettes    Quit date: 06/30/1987    Years since quitting: 34.0   Smokeless tobacco: Never  Vaping Use   Vaping Use: Never used  Substance Use Topics   Alcohol use: Not Currently   Drug use: Not Currently    Pertinent Clinical Results:  LABS: Labs reviewed: Acceptable for surgery.  Hospital Outpatient Visit on 06/26/2021  Component Date Value Ref Range Status   WBC 06/26/2021 6.1  4.0 - 10.5 K/uL Final   RBC 06/26/2021 4.93  4.22 - 5.81 MIL/uL Final   Hemoglobin 06/26/2021 14.1  13.0 - 17.0 g/dL Final   HCT 06/26/2021 41.7  39.0 - 52.0 % Final   MCV 06/26/2021 84.6  80.0 - 100.0 fL Final   MCH 06/26/2021 28.6  26.0 - 34.0 pg Final   MCHC 06/26/2021 33.8  30.0 - 36.0 g/dL Final   RDW 06/26/2021 12.8  11.5 - 15.5 % Final   Platelets 06/26/2021 179  150 - 400 K/uL Final   nRBC 06/26/2021 0.0  0.0 - 0.2 % Final    Neutrophils Relative % 06/26/2021 66  % Final   Neutro Abs 06/26/2021 4.0  1.7 - 7.7 K/uL Final   Lymphocytes Relative 06/26/2021 25  % Final   Lymphs Abs 06/26/2021 1.5  0.7 - 4.0 K/uL Final   Monocytes Relative 06/26/2021 7  % Final   Monocytes Absolute 06/26/2021 0.4  0.1 - 1.0 K/uL Final   Eosinophils Relative 06/26/2021 2  % Final   Eosinophils Absolute 06/26/2021 0.1  0.0 - 0.5 K/uL Final   Basophils Relative 06/26/2021 0  % Final   Basophils Absolute 06/26/2021 0.0  0.0 - 0.1 K/uL Final   Immature Granulocytes 06/26/2021 0  % Final   Abs Immature Granulocytes 06/26/2021 0.02  0.00 - 0.07 K/uL Final   Performed at [facility], [address] [place], [place] [address]   Prothrombin Time 06/26/2021 13.1  11.4 - 15.2 seconds Final  INR 06/26/2021 1.0  0.8 - 1.2 Final   Comment: (NOTE) INR goal varies based on device and disease states. Performed at [facility], [place], [place] [address]    Sodium 06/26/2021 138  135 - 145 mmol/L Final   Potassium 06/26/2021 4.0  3.5 - 5.1 mmol/L Final   Chloride 06/26/2021 106  98 - 111 mmol/L Final   CO2 06/26/2021 28  22 - 32 mmol/L Final   Glucose, Bld 06/26/2021 114 (H)  70 - 99 mg/dL Final   Glucose reference range applies only to samples taken after fasting for at least 8 hours.   BUN 06/26/2021 21  8 - 23 mg/dL Final   Creatinine, Ser 06/26/2021 1.06  0.61 - 1.24 mg/dL Final   Calcium 06/26/2021 9.4  8.9 - 10.3 mg/dL Final   GFR, Estimated 06/26/2021 >60  >60 mL/min Final   Comment: (NOTE) Calculated using the CKD-EPI Creatinine Equation (2021)   Anion gap 06/26/2021 4 (L)  5 - 15 Final    ECG: Date: 03/26/2021 Rate: 63 bpm Rhythm:  SR with marked SA; IRBBB Intervals: PR 162 ms. QRS 110 ms. QTc 444 ms. ST segment and T wave changes: No evidence of acute ST segment elevation or depression. Evidence of age undetermined inferior and anterior infarcts present.  Comparison: Similar to previous  tracing obtained on 05/05/2018  IMAGING / PROCEDURES: CT CHEST WITH CONTRAST performed on 06/13/2021 Solitary RIGHT lower lobe pulmonary nodule. Indeterminate finding in patient with LEFT renal mass. LEFT renal mass not imaged. Post CABG anatomy.  CT HEMATURIA WORKUP performed on 05/27/2021 4.7 cm enhancing mass of the left kidney lower pole with morphologic characteristics most compatible with renal cell carcinoma. No adenopathy or tumor thrombus in the left renal vein. 9 x 6 x 8 mm solid right lower lobe nodule. Fleischner criteria do not apply given the presence of the left renal mass. Completion imaging of the chest may be warranted in the context of staging workup. Surveillance of the dominant right lower lobe nodule is suggested. Bilateral single nonobstructive renal calculi. Tiny bladder calculi are noted. Prostatomegaly. Small type 1 hiatal hernia.  Mild cardiomegaly.  Cholelithiasis.  Prominent stool throughout the colon favors constipation.  Aortic atherosclerosis Lower lumbar spondylosis and degenerative disc disease.  Small umbilical hernia contains adipose tissue.  TRANSTHORACIC ECHOCARDIOGRAM performed on 03/24/2018 Left ventricle: The cavity size was normal. There was mild concentric hypertrophy. Systolic function was normal. The estimated ejection fraction was in the range of 55% to 60%. Wall motion was normal; there were no regional wall motion  abnormalities. Left ventricular diastolic function parameters were normal.  Mitral valve: There was mild regurgitation. Valve area by pressure half-time: 1.98 cm^2.  Left atrium: The atrium was moderately dilated.  Right ventricle: The cavity size was normal. Wall thickness was normal. Systolic function was normal.  Pulmonary arteries: Systolic pressure was mildly increased. PA peak pressure: 37 mm Hg (S).  Pericardium, extracardiac: A mild pericardial effusion was identified lateral to the left ventricle. Features were not  consistent with tamponade physiology.  CORONARY ARTERY BYPASS GRAFTING PROCEDURE performed on 03/22/2018 3 vessel CABG procedure LIMA-LAD SVG-OM SVG-RI  LEFT HEART CATHETERIZATION AND CORONARY ANGIOGRAPHY performed on 03/15/2018 EF 50% Inferior hypokinesis Multi-vessel CAD 80% proximal LAD 99% OM1 100% proximal RCA Attempted PCI and recommendations Unsuccessful PCI and stent of OM1 failure across the lesion with wire Consider transfer the patient to tertiary care center for possible attempted intervention Patient vessel went from TIMI I  to TIMI 0 flow Patient remained hemodynamically stable Anticoagulation with Angiomax was discontinued he has been loaded with aspirin and Plavix    Impression and Plan:  Lando Alcalde has been referred for pre-anesthesia review and clearance prior to him undergoing the planned anesthetic and procedural courses. Available labs, pertinent testing, and imaging results were personally reviewed by me. This patient has been appropriately cleared by cardiology with an overall LOW risk of significant perioperative cardiovascular complications.  Based on clinical review performed today (07/22/21), barring any significant acute changes in the patient's overall condition, it is anticipated that he will be able to proceed with the planned surgical intervention. Any acute changes in clinical condition may necessitate his procedure being postponed and/or cancelled. Patient will meet with anesthesia team (MD and/or CRNA) on the day of his procedure for preoperative evaluation/assessment. Questions regarding anesthetic course will be fielded at that time.   Pre-surgical instructions were reviewed with the patient during his [name] appointment and questions were fielded by [name] clinical staff. Patient was advised that if any questions or concerns arise prior to his procedure then he should return a call to [name] and/or his surgeon's office to discuss.  Honor Loh, MSN, APRN,  FNP-C, CEN [facility]  Peri-operative Services Nurse Practitioner Phone: [phone] Fax: [phone] 07/22/21 10:04 AM  NOTE: This note has been prepared using Dragon dictation software. Despite my best ability to proofread, there is always the potential that unintentional transcriptional errors may still occur from this process.

## 2021-07-27 MED ORDER — GEMCITABINE CHEMO FOR BLADDER INSTILLATION 2000 MG
2000.0000 mg | Freq: Once | INTRAVENOUS | Status: DC
  Filled 2021-07-27: qty 52.6

## 2021-07-28 ENCOUNTER — Ambulatory Visit: Payer: Medicare Other | Admitting: Urgent Care

## 2021-07-28 ENCOUNTER — Encounter
Admission: RE | Disposition: A | Discharge status: Home / Self Care | Payer: Self-pay | Source: Home / Self Care | Attending: Urology

## 2021-07-28 ENCOUNTER — Ambulatory Visit
Admission: RE | Admit: 2021-07-28 | Discharge: 2021-07-28 | Disposition: A | Discharge status: Home / Self Care | Payer: Medicare Other | Attending: Urology | Admitting: Urology

## 2021-07-28 ENCOUNTER — Other Ambulatory Visit: Payer: Self-pay

## 2021-07-28 ENCOUNTER — Encounter: Payer: Self-pay | Admitting: Urology

## 2021-07-28 DIAGNOSIS — K219 Gastro-esophageal reflux disease without esophagitis: Secondary | ICD-10-CM | POA: Diagnosis not present

## 2021-07-28 DIAGNOSIS — N400 Enlarged prostate without lower urinary tract symptoms: Secondary | ICD-10-CM | POA: Diagnosis not present

## 2021-07-28 DIAGNOSIS — D494 Neoplasm of unspecified behavior of bladder: Secondary | ICD-10-CM | POA: Diagnosis not present

## 2021-07-28 DIAGNOSIS — C674 Malignant neoplasm of posterior wall of bladder: Secondary | ICD-10-CM | POA: Diagnosis not present

## 2021-07-28 DIAGNOSIS — E785 Hyperlipidemia, unspecified: Secondary | ICD-10-CM | POA: Diagnosis not present

## 2021-07-28 DIAGNOSIS — Z87891 Personal history of nicotine dependence: Secondary | ICD-10-CM | POA: Insufficient documentation

## 2021-07-28 DIAGNOSIS — C679 Malignant neoplasm of bladder, unspecified: Secondary | ICD-10-CM | POA: Insufficient documentation

## 2021-07-28 HISTORY — DX: Benign prostatic hyperplasia without lower urinary tract symptoms: N40.0

## 2021-07-28 HISTORY — PX: TRANSURETHRAL RESECTION OF BLADDER TUMOR: SHX2575

## 2021-07-28 HISTORY — DX: Calculus of kidney: N20.0

## 2021-07-28 HISTORY — DX: Atherosclerosis of aorta: I70.0

## 2021-07-28 SURGERY — TURBT (TRANSURETHRAL RESECTION OF BLADDER TUMOR)
Anesthesia: General

## 2021-07-28 MED ORDER — SODIUM CHLORIDE 0.9 % IR SOLN
Status: DC | PRN
  Administered 2021-07-28: 800 mL via INTRAVESICAL

## 2021-07-28 MED ORDER — LACTATED RINGERS IV SOLN: INTRAVENOUS | Status: DC

## 2021-07-28 MED ORDER — ONDANSETRON HCL 4 MG/2ML IJ SOLN: 4.0000 mg | Freq: Once | INTRAMUSCULAR | Status: DC | PRN

## 2021-07-28 MED ORDER — CEFAZOLIN SODIUM-DEXTROSE 2-4 GM/100ML-% IV SOLN
2.0000 g | INTRAVENOUS | Status: AC
  Administered 2021-07-28: 2 g via INTRAVENOUS

## 2021-07-28 MED ORDER — FENTANYL CITRATE (PF) 100 MCG/2ML IJ SOLN
INTRAMUSCULAR | Status: DC | PRN
  Administered 2021-07-28: 50 ug via INTRAVENOUS

## 2021-07-28 MED ORDER — FENTANYL CITRATE (PF) 100 MCG/2ML IJ SOLN: 25.0000 ug | INTRAMUSCULAR | Status: DC | PRN

## 2021-07-28 MED ORDER — OXYBUTYNIN CHLORIDE 5 MG PO TABS: 5.0000 mg | ORAL_TABLET | Freq: Three times a day (TID) | ORAL | 0 refills | Status: DC | PRN

## 2021-07-28 MED ORDER — FAMOTIDINE 20 MG PO TABS: 20.0000 mg | ORAL_TABLET | Freq: Once | ORAL | Status: AC

## 2021-07-28 MED ORDER — ONDANSETRON HCL 4 MG/2ML IJ SOLN
INTRAMUSCULAR | Status: DC | PRN
  Administered 2021-07-28: 4 mg via INTRAVENOUS

## 2021-07-28 MED ORDER — PROPOFOL 10 MG/ML IV BOLUS
INTRAVENOUS | Status: DC | PRN
Start: 2021-07-28 — End: 2021-07-28
  Administered 2021-07-28: 150 mg via INTRAVENOUS

## 2021-07-28 MED ORDER — CHLORHEXIDINE GLUCONATE 0.12 % MT SOLN
OROMUCOSAL | Status: AC
  Administered 2021-07-28: 15 mL via OROMUCOSAL
  Filled 2021-07-28: qty 15

## 2021-07-28 MED ORDER — PROPOFOL 10 MG/ML IV BOLUS
INTRAVENOUS | Status: AC
  Filled 2021-07-28: qty 20

## 2021-07-28 MED ORDER — CHLORHEXIDINE GLUCONATE 0.12 % MT SOLN: 15.0000 mL | Freq: Once | OROMUCOSAL | Status: AC

## 2021-07-28 MED ORDER — OXYBUTYNIN CHLORIDE 5 MG PO TABS
ORAL_TABLET | ORAL | Status: AC
  Filled 2021-07-28: qty 1

## 2021-07-28 MED ORDER — ORAL CARE MOUTH RINSE: 15.0000 mL | Freq: Once | OROMUCOSAL | Status: AC

## 2021-07-28 MED ORDER — FAMOTIDINE 20 MG PO TABS
ORAL_TABLET | ORAL | Status: AC
  Administered 2021-07-28: 20 mg via ORAL
  Filled 2021-07-28: qty 1

## 2021-07-28 MED ORDER — GEMCITABINE CHEMO FOR BLADDER INSTILLATION 2000 MG
INTRAVENOUS | Status: DC | PRN
  Administered 2021-07-28: 2000 mg via INTRAVESICAL

## 2021-07-28 MED ORDER — FENTANYL CITRATE (PF) 100 MCG/2ML IJ SOLN
INTRAMUSCULAR | Status: AC
  Filled 2021-07-28: qty 2

## 2021-07-28 MED ORDER — EPHEDRINE SULFATE (PRESSORS) 50 MG/ML IJ SOLN
INTRAMUSCULAR | Status: DC | PRN
  Administered 2021-07-28: 5 mg via INTRAVENOUS

## 2021-07-28 MED ORDER — ACETAMINOPHEN 10 MG/ML IV SOLN
INTRAVENOUS | Status: AC
  Filled 2021-07-28: qty 100

## 2021-07-28 MED ORDER — OXYBUTYNIN CHLORIDE 5 MG PO TABS
5.0000 mg | ORAL_TABLET | Freq: Three times a day (TID) | ORAL | Status: DC | PRN
  Administered 2021-07-28: 5 mg via ORAL

## 2021-07-28 MED ORDER — LIDOCAINE HCL (CARDIAC) PF 100 MG/5ML IV SOSY
PREFILLED_SYRINGE | INTRAVENOUS | Status: DC | PRN
Start: 2021-07-28 — End: 2021-07-28
  Administered 2021-07-28 (×2): 50 mg via INTRAVENOUS

## 2021-07-28 MED ORDER — CEFAZOLIN SODIUM-DEXTROSE 2-4 GM/100ML-% IV SOLN
INTRAVENOUS | Status: AC
  Filled 2021-07-28: qty 100

## 2021-07-28 MED ORDER — ACETAMINOPHEN 10 MG/ML IV SOLN
INTRAVENOUS | Status: DC | PRN
  Administered 2021-07-28: 1000 mg via INTRAVENOUS

## 2021-07-28 MED ORDER — HYDROCODONE-ACETAMINOPHEN 5-325 MG PO TABS: 1.0000 | ORAL_TABLET | Freq: Four times a day (QID) | ORAL | 0 refills | Status: DC | PRN

## 2021-07-28 SURGICAL SUPPLY — 35 items
BAG DRAIN CYSTO-URO LG1000N (MISCELLANEOUS) ×2 IMPLANT
BAG DRN RND TRDRP ANRFLXCHMBR (UROLOGICAL SUPPLIES) ×1
BAG URINE DRAIN 2000ML AR STRL (UROLOGICAL SUPPLIES) ×2 IMPLANT
BRUSH SCRUB EZ  4% CHG (MISCELLANEOUS) ×1
BRUSH SCRUB EZ 4% CHG (MISCELLANEOUS) ×1 IMPLANT
CATH FOLEY 2WAY  5CC 16FR (CATHETERS) ×1
CATH FOLEY 2WAY 5CC 16FR (CATHETERS) ×1
CATH URTH 16FR FL 2W BLN LF (CATHETERS) ×1 IMPLANT
DRAPE UTILITY 15X26 TOWEL STRL (DRAPES) ×2 IMPLANT
DRSG TELFA 4X3 1S NADH ST (GAUZE/BANDAGES/DRESSINGS) ×2 IMPLANT
ELECT LOOP 22F BIPOLAR SML (ELECTROSURGICAL) ×2
ELECT REM PT RETURN 9FT ADLT (ELECTROSURGICAL)
ELECTRODE LOOP 22F BIPOLAR SML (ELECTROSURGICAL) IMPLANT
ELECTRODE REM PT RTRN 9FT ADLT (ELECTROSURGICAL) IMPLANT
GAUZE 4X4 16PLY ~~LOC~~+RFID DBL (SPONGE) ×3 IMPLANT
GLOVE SURG ENC MOIS LTX SZ6.5 (GLOVE) ×2 IMPLANT
GOWN STRL REUS W/ TWL LRG LVL3 (GOWN DISPOSABLE) ×2 IMPLANT
GOWN STRL REUS W/TWL LRG LVL3 (GOWN DISPOSABLE) ×4
IV NS IRRIG 3000ML ARTHROMATIC (IV SOLUTION) ×2 IMPLANT
KIT TURNOVER CYSTO (KITS) ×2 IMPLANT
LOOP CUT BIPOLAR 24F LRG (ELECTROSURGICAL) IMPLANT
NDL FILTER BLUNT 18X1 1/2 (NEEDLE) IMPLANT
NDL SAFETY ECLIPSE 18X1.5 (NEEDLE) ×1 IMPLANT
NEEDLE FILTER BLUNT 18X 1/2SAF (NEEDLE) ×1
NEEDLE FILTER BLUNT 18X1 1/2 (NEEDLE) ×1 IMPLANT
NEEDLE HYPO 18GX1.5 SHARP (NEEDLE) ×2
PACK CYSTO AR (MISCELLANEOUS) ×2 IMPLANT
PAD ARMBOARD 7.5X6 YLW CONV (MISCELLANEOUS) ×2 IMPLANT
SET IRRIG Y TYPE TUR BLADDER L (SET/KITS/TRAYS/PACK) ×2 IMPLANT
SURGILUBE 2OZ TUBE FLIPTOP (MISCELLANEOUS) ×2 IMPLANT
SYR TOOMEY IRRIG 70ML (MISCELLANEOUS) ×2
SYRINGE TOOMEY IRRIG 70ML (MISCELLANEOUS) ×1 IMPLANT
WATER STERILE IRR 1000ML POUR (IV SOLUTION) ×2 IMPLANT
WATER STERILE IRR 3000ML UROMA (IV SOLUTION) IMPLANT
WATER STERILE IRR 500ML POUR (IV SOLUTION) ×2 IMPLANT

## 2021-07-28 NOTE — Interval H&P Note (Signed)
History and Physical Interval Note:  07/28/2021 11:03 AM  David Mccullough  has presented today for surgery, with the diagnosis of BLADDER TUMOR.  The various methods of treatment have been discussed with the patient and family. After consideration of risks, benefits and other options for treatment, the patient has consented to  Procedure(s): TRANSURETHRAL RESECTION OF BLADDER TUMOR (TURBT)WITH  GEMCITABINE (N/A) as a surgical intervention.  The patient's history has been reviewed, patient examined, no change in status, stable for surgery.  I have reviewed the patient's chart and labs.  Questions were answered to the patient's satisfaction.    RRR CTAB   Hollice Espy

## 2021-07-28 NOTE — Transfer of Care (Signed)
Immediate Anesthesia Transfer of Care Note  Patient: David Mccullough  Procedure(s) Performed: TRANSURETHRAL RESECTION OF BLADDER TUMOR (TURBT)WITH  GEMCITABINE  Patient Location: PACU  Anesthesia Type:General  Level of Consciousness: drowsy  Airway & Oxygen Therapy: Patient Spontanous Breathing  Post-op Assessment: Report given to RN and Post -op Vital signs reviewed and stable  Post vital signs: Reviewed and stable  Last Vitals:  Vitals Value Taken Time  BP 118/70 07/28/21 1217  Temp    Pulse 67 07/28/21 1218  Resp 14 07/28/21 1218  SpO2 93 % 07/28/21 1218  Vitals shown include unvalidated device data.  Last Pain:  Vitals:   07/28/21 0847  TempSrc: Temporal         Complications: No notable events documented.

## 2021-07-28 NOTE — Anesthesia Procedure Notes (Signed)
Procedure Name: LMA Insertion Date/Time: 07/28/2021 11:50 AM Performed by: Biagio Borg, CRNA Pre-anesthesia Checklist: Patient identified, Emergency Drugs available, Suction available and Patient being monitored Patient Re-evaluated:Patient Re-evaluated prior to induction Oxygen Delivery Method: Circle system utilized Preoxygenation: Pre-oxygenation with 100% oxygen Induction Type: IV induction Ventilation: Mask ventilation without difficulty LMA: LMA inserted LMA Size: 5.0 Tube type: Oral Number of attempts: 1 Placement Confirmation: positive ETCO2 and breath sounds checked- equal and bilateral Tube secured with: Tape Dental Injury: Teeth and Oropharynx as per pre-operative assessment

## 2021-07-28 NOTE — Progress Notes (Signed)
°   07/28/21 1100  Clinical Encounter Type  Visited With Patient  Visit Type Pre-op  Spiritual Encounters  Spiritual Needs Prayer   Patient requested prayer of upcoming procedure. Chaplain provided support and care through reflective listening and prayer

## 2021-07-28 NOTE — Anesthesia Postprocedure Evaluation (Signed)
Anesthesia Post Note  Patient: David Mccullough  Procedure(s) Performed: TRANSURETHRAL RESECTION OF BLADDER TUMOR (TURBT)WITH  GEMCITABINE  Patient location during evaluation: PACU Anesthesia Type: General Level of consciousness: awake and oriented Pain management: pain level controlled Vital Signs Assessment: post-procedure vital signs reviewed and stable Respiratory status: spontaneous breathing Cardiovascular status: stable Anesthetic complications: no   No notable events documented.   Last Vitals:  Vitals:   07/28/21 1246 07/28/21 1300  BP: 122/63   Pulse: 64 63  Resp: 11 11  Temp:    SpO2: 98% 100%    Last Pain:  Vitals:   07/28/21 1300  TempSrc:   PainSc: 0-No pain                 VAN STAVEREN,Kimarion Chery

## 2021-07-28 NOTE — Discharge Instructions (Addendum)

## 2021-07-28 NOTE — Anesthesia Preprocedure Evaluation (Signed)
Anesthesia Evaluation  Patient identified by MRN, date of birth, ID band Patient awake    Reviewed: Allergy & Precautions, NPO status , Patient's Chart, lab work & pertinent test results  History of Anesthesia Complications Negative for: history of anesthetic complications  Airway Mallampati: II  TM Distance: >3 FB Neck ROM: full    Dental  (+) Teeth Intact   Pulmonary neg pulmonary ROS, former smoker,    Pulmonary exam normal  + decreased breath sounds      Cardiovascular Exercise Tolerance: Good hypertension, Pt. on medications + CAD and + Past MI  negative cardio ROS Normal cardiovascular exam+ dysrhythmias Atrial Fibrillation  Rhythm:Regular     Neuro/Psych Depression negative neurological ROS  negative psych ROS   GI/Hepatic negative GI ROS, Neg liver ROS, GERD  ,  Endo/Other  negative endocrine ROS  Renal/GU Renal diseaseRenal cell ca...  negative genitourinary   Musculoskeletal negative musculoskeletal ROS (+)   Abdominal   Peds negative pediatric ROS (+)  Hematology negative hematology ROS (+)   Anesthesia Other Findings Past Medical History: No date: Aortic atherosclerosis ([place]) 07/03/2021: Bladder tumor     Comment:  a.) cystoscopy revealed 1.5 cm relatively flat papillary              tumor lateral to UO; felt to be consistent with TCC 03/15/2018: CAD (coronary artery disease)     Comment:  a.) NSTEMI 03/14/2018 --> LHC --> EF 50% with inferior               HK; 80% pLAD, 99% OM1, 100% pRCA --> failed PCI (TIMI-1               reduced to TIMI-0); transferred to [facility] and               underwent 3v CABG (LIMA-LAD, SVG-OM, SVG-RI) on               03/22/2018. No date: Colon polyps No date: Depression No date: Elevated PSA No date: Enlarged prostate No date: GERD (gastroesophageal reflux disease) No date: Hyperlipidemia No date: Hypertension No date: Nephrolithiasis 03/14/2018: NSTEMI  (non-ST elevated myocardial infarction) [facility])     Comment:  a.) LHC 03/15/2018 --> 80% pLAD, 99% OM1, 100% pRCA -->               failed PCI and Tx'd to [facility] for [facility] consult;               underwent 3v CABG (LIMA-LAD, SVG-OM, SVG-RI) on               03/25/2018. No date: Persistent atrial fibrillation (HCC)     Comment:  a.) CHA2DS2-VASc = 4 (age x2, HTN, prior MI). b.)               rate/rhythm maintained without pharmacological               intervention; no daily anticoagulation. 05/27/2021: Renal mass, left     Comment:  a.) CT urogram --> 4.1 x 3.6 x 4.7 cm mass at LEFT               kidney pole. b.) Bx (+) for RCC, conventional clear cell               type, nuclear grade II. 05/27/2021: Right lower lobe pulmonary nodule     Comment:  a.) CT chest --> measured 9 mm. 03/22/2018: S/P CABG x 3     Comment:  a.) 3v CABG: LIMA-LAD, SVG-OM, SVG-RI 2009: SVT (supraventricular tachycardia) ([place])     Comment:  a.) s/p SVT ablation while living in [place]  Past Surgical History: No date: COLONOSCOPY 03/22/2018: CORONARY ARTERY BYPASS GRAFT; N/A     Comment:  Procedure: CORONARY ARTERY BYPASS GRAFTING (CABG) x 3               WITH ENDOSCOPIC HARVESTING OF RIGHT GREATER SAPHENOUS               VEIN;  Surgeon: Ivin Poot, MD;  Location: [place];                Service: Open Heart Surgery;  Laterality: N/A; 03/16/2018: CORONARY STENT INTERVENTION; N/A     Comment:  Procedure: CORONARY STENT INTERVENTION;  Surgeon:               Yolonda Kida, MD;  Location: [place] [facility];               Service: Cardiovascular;  Laterality: N/A; 03/22/2018: LEFT ATRIAL APPENDAGE OCCLUSION; N/A     Comment:  Procedure: LEFT ATRIAL APPENDAGE OCCLUSION USING 40 MM               ATRICURE ATRICLIP;  Surgeon: Ivin Poot, MD;                Location: [place];  Service: Open Heart Surgery;                Laterality: N/A; 03/15/2018: LEFT HEART CATH AND CORONARY ANGIOGRAPHY; N/A      Comment:  Procedure: LEFT HEART CATH AND CORONARY ANGIOGRAPHY and               PCI stent;  Surgeon: Yolonda Kida, MD;  Location:               [place] [facility];  Service: Cardiovascular;                Laterality: N/A; 2009: SVT ABLATION; N/A     Comment:  Procedure: SVT ABLATION; Location: performed while               living in [place]. 03/22/2018: TEE WITHOUT CARDIOVERSION; N/A     Comment:  Procedure: TRANSESOPHAGEAL ECHOCARDIOGRAM (TEE);                Surgeon: Prescott Gum, Collier Salina, MD;  Location: [place];                Service: Open Heart Surgery;  Laterality: N/A;  BMI    Body Mass Index: 28.48 kg/m      Reproductive/Obstetrics negative OB ROS                             Anesthesia Physical Anesthesia Plan  ASA: 3  Anesthesia Plan: General   Post-op Pain Management:    Induction: Intravenous  PONV Risk Score and Plan:   Airway Management Planned: LMA  Additional Equipment:   Intra-op Plan:   Post-operative Plan: Extubation in OR  Informed Consent: I have reviewed the patients History and Physical, chart, labs and discussed the procedure including the risks, benefits and alternatives for the proposed anesthesia with the patient or authorized representative who has indicated his/her understanding and acceptance.       Plan Discussed with: CRNA and Surgeon  Anesthesia Plan Comments:         Anesthesia  Quick Evaluation

## 2021-07-28 NOTE — Op Note (Signed)
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  No other lesions in the bladder identified.  Mildly trabeculated bladder.  EBL: Minimal  Specimens: Bladder tumor  Drains: 16 French Foley catheter  Indication: David Mccullough is a 76 y.o. patient with hematuria found to have a bladder tumor as outlined above..  After reviewing the management options for treatment, he elected to proceed with the above surgical procedure(s). We have discussed the potential benefits and risks of the procedure, side effects of the proposed treatment, the likelihood of the patient achieving the goals of the procedure, and any potential problems that might occur during the procedure or recuperation. Informed consent has been obtained.  Description of procedure:  The patient was taken to the operating room and general anesthesia was induced.  The patient was placed in the dorsal lithotomy position, prepped and draped in the usual sterile fashion, and preoperative antibiotics were administered. A preoperative time-out was performed.   A 21 French the scope was advanced per urethra into the bladder.  Attention was turned to the left lateral bladder wall where a 1.5 cm flat broad-based papillary tumor was identified.  It had some superficial calcification.  General appearance appeared to be superficial.  I ended up resecting the tumor and a piece wise fashion using cold cup biopsy forceps, 6 or 7 pieces to completely remove the entirety of the tumor down to the muscular layer.  I then brought in a bipolar loop and using saline as the medium, fulgurated the  entirety of the base of the tumor and around the edges for excellent hemostasis.  The remainder of the bladder was then inspected.  No residual chips or tumors were identified.  The bladder was then drained.  A 16 French Foley catheter was then placed using 10 cc of sterile water in the balloon.  Patient was then cleaned and dried, repositioned in the supine position, reversed myesthesia and taken the PACU in stable condition.  2000 mg of intravesical gemcitabine was instilled into the bladder and allowed to dwell for 1 hour in the PACU.  This was well-tolerated.  After an hour, the catheter was opened and drained.  The Foley catheter was removed.  Plan: I will see him next week in the office discuss his surgical pathology as well as plans for his nephrectomy.  We will also have his PET scan results at the same time.  Hollice Espy, M.D.

## 2021-07-29 ENCOUNTER — Encounter: Payer: Self-pay | Admitting: Urology

## 2021-07-29 LAB — SURGICAL PATHOLOGY

## 2021-07-30 ENCOUNTER — Ambulatory Visit: Payer: Medicare Other | Admitting: Family Medicine

## 2021-07-31 ENCOUNTER — Other Ambulatory Visit: Payer: Self-pay

## 2021-07-31 ENCOUNTER — Encounter
Admission: RE | Admit: 2021-07-31 | Discharge: 2021-07-31 | Disposition: A | Discharge status: Home / Self Care | Payer: Medicare Other | Source: Ambulatory Visit | Attending: Urology | Admitting: Urology

## 2021-07-31 DIAGNOSIS — D494 Neoplasm of unspecified behavior of bladder: Secondary | ICD-10-CM | POA: Diagnosis not present

## 2021-07-31 DIAGNOSIS — R911 Solitary pulmonary nodule: Secondary | ICD-10-CM | POA: Diagnosis not present

## 2021-07-31 DIAGNOSIS — N2889 Other specified disorders of kidney and ureter: Secondary | ICD-10-CM | POA: Diagnosis not present

## 2021-07-31 DIAGNOSIS — C642 Malignant neoplasm of left kidney, except renal pelvis: Secondary | ICD-10-CM | POA: Insufficient documentation

## 2021-07-31 DIAGNOSIS — I700 Atherosclerosis of aorta: Secondary | ICD-10-CM | POA: Diagnosis not present

## 2021-07-31 DIAGNOSIS — N400 Enlarged prostate without lower urinary tract symptoms: Secondary | ICD-10-CM | POA: Diagnosis not present

## 2021-07-31 DIAGNOSIS — I517 Cardiomegaly: Secondary | ICD-10-CM | POA: Diagnosis not present

## 2021-07-31 LAB — GLUCOSE, CAPILLARY: Glucose-Capillary: 82 mg/dL (ref 70–99)

## 2021-07-31 MED ORDER — FLUDEOXYGLUCOSE F - 18 (FDG) INJECTION
11.6900 | Freq: Once | INTRAVENOUS | Status: AC | PRN
  Administered 2021-07-31: 11.69 via INTRAVENOUS

## 2021-08-02 ENCOUNTER — Encounter: Payer: Self-pay | Admitting: Urology

## 2021-08-06 NOTE — Progress Notes (Incomplete)
08/06/21 12:10 PM   David Mccullough 02-Jun-1946 [id number]  Referring provider:  Juline Patch, MD [address] [place],  [place] [address] No chief complaint on file.    HPI: David Mccullough is a 76 y.o.male  with a personal history of  urinary urgency, incomplete bladder emptying, elevated PSA, and microscopic hematuria, who presents today for 1 week post-op with results.   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 No adenopathy or tumor thrombus in the left renal vein. 9 x 6 x 8 mm solid right lower lobe nodule. Fleischner criteria do not apply given the presence of the left renal mass. Bilateral single nonobstructive renal calculi. Tiny bladder calculi are noted and prostatomegaly.   He underwent a chest CT on 06/13/2021 that revealed solitary right lower lobe pulmonary nodule indeterminate finding in patient with left renal mass.    He is s/p renal biopsy on 06/26/2021. Surgical pathology revealed renal cell carcinoma, conventional clear-cell type, nuclear grade 2.   He is s/p TURBT and instillation of intravesical chemotherapy on 07/28/2021. 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  No other lesions in the bladder identified.  Mildly trabeculated bladder.  Surgical pathology on showed consistent with non-invasive papillary urothelial carcinoma, low grade. Muscularis propria is present and negative for tumor.   08/01/2021 PET scan visualized no significant FDG uptake associated with the right lower lobe lung nodule. Given the small size of this nodule (8 mm) continued surveillance is recommended to confirm stability of this nodule as certain low-grade pulmonary neoplasms as well as renal cell carcinomas may  exhibit low level FDG uptake on PET-CT. Left kidney mass exhibits mild to moderate increased uptake within SUV max of 4.12. No signs of tracer avid nodal metastasis or solid organ metastasis within the abdomen or pelvis.   PMH: Past Medical History:  Diagnosis Date   Aortic atherosclerosis ([place])    Bladder tumor 07/03/2021   a.) cystoscopy revealed 1.5 cm relatively flat papillary tumor lateral to UO; felt to be consistent with TCC   CAD (coronary artery disease) 03/15/2018   a.) NSTEMI 03/14/2018 --> LHC --> EF 50% with inferior HK; 80% pLAD, 99% OM1, 100% pRCA --> failed PCI (TIMI-1 reduced to TIMI-0); transferred to [facility] and underwent 3v CABG (LIMA-LAD, SVG-OM, SVG-RI) on 03/22/2018.   Colon polyps    Depression    Elevated PSA    Enlarged prostate    GERD (gastroesophageal reflux disease)    Hyperlipidemia    Hypertension    Nephrolithiasis    NSTEMI (non-ST elevated myocardial infarction) ([place]) 03/14/2018   a.) LHC 03/15/2018 --> 80% pLAD, 99% OM1, 100% pRCA --> failed PCI and Tx'd to [facility] for [facility] consult; underwent 3v CABG (LIMA-LAD, SVG-OM, SVG-RI) on 03/25/2018.   Persistent atrial fibrillation (HCC)    a.) CHA2DS2-VASc = 4 (age x2, HTN, prior MI). b.) rate/rhythm maintained without pharmacological intervention; no daily anticoagulation.   Renal mass, left 05/27/2021   a.) CT urogram --> 4.1 x 3.6 x 4.7 cm mass at LEFT kidney pole. b.) Bx (+) for RCC, conventional clear cell type, nuclear grade II.   Right lower lobe pulmonary nodule 05/27/2021   a.) CT chest --> measured 9 mm.   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   a.) 3v CABG:  LIMA-LAD, SVG-OM, SVG-RI   SVT (supraventricular tachycardia) ([place]) 2009   a.) s/p SVT ablation while living in [place]    Surgical History: Past Surgical History:  Procedure Laterality Date   COLONOSCOPY     CORONARY ARTERY BYPASS GRAFT N/A 03/22/2018   Procedure: CORONARY ARTERY BYPASS GRAFTING (CABG) x 3 WITH ENDOSCOPIC HARVESTING OF RIGHT  GREATER SAPHENOUS VEIN;  Surgeon: Ivin Poot, MD;  Location: [place];  Service: Open Heart Surgery;  Laterality: N/A;   CORONARY STENT INTERVENTION N/A 03/16/2018   Procedure: CORONARY STENT INTERVENTION;  Surgeon: Yolonda Kida, MD;  Location: [place] [facility];  Service: Cardiovascular;  Laterality: N/A;   LEFT ATRIAL APPENDAGE OCCLUSION N/A 03/22/2018   Procedure: LEFT ATRIAL APPENDAGE OCCLUSION USING 40 MM ATRICURE ATRICLIP;  Surgeon: Ivin Poot, MD;  Location: [place];  Service: Open Heart Surgery;  Laterality: N/A;   LEFT HEART CATH AND CORONARY ANGIOGRAPHY N/A 03/15/2018   Procedure: LEFT HEART CATH AND CORONARY ANGIOGRAPHY and PCI stent;  Surgeon: Yolonda Kida, MD;  Location: [place] [facility];  Service: Cardiovascular;  Laterality: N/A;   SVT ABLATION N/A 2009   Procedure: SVT ABLATION; Location: performed while living in [place].   TEE WITHOUT CARDIOVERSION N/A 03/22/2018   Procedure: TRANSESOPHAGEAL ECHOCARDIOGRAM (TEE);  Surgeon: Prescott Gum, Collier Salina, MD;  Location: [place];  Service: Open Heart Surgery;  Laterality: N/A;   TRANSURETHRAL RESECTION OF BLADDER TUMOR N/A 07/28/2021   Procedure: TRANSURETHRAL RESECTION OF BLADDER TUMOR (TURBT)WITH  GEMCITABINE;  Surgeon: Hollice Espy, MD;  Location: [facility];  Service: Urology;  Laterality: N/A;    Home Medications:  Allergies as of 08/07/2021   No Known Allergies      Medication List        Accurate as of August 06, 2021 12:10 PM. If you have any questions, ask your nurse or doctor.          aspirin EC 81 MG tablet Take 81 mg by mouth daily. Swallow whole.   atorvastatin 80 MG tablet Commonly known as: LIPITOR TAKE 1 TABLET(80 MG) BY MOUTH DAILY AT 6 PM What changed: See the new instructions.   calcium carbonate 500 MG chewable tablet Commonly known as: TUMS - dosed in mg elemental calcium Chew 1 tablet by mouth as needed for indigestion or heartburn.   CALCIUM PO Take 1 tablet by mouth  daily.   finasteride 5 MG tablet Commonly known as: PROSCAR Take 1 tablet (5 mg total) by mouth daily. What changed: when to take this   FISH OIL PO Take 1 tablet by mouth daily.   Gemtesa 75 MG Tabs Generic drug: Vibegron Take 75 mg by mouth daily. What changed: when to take this   HYDROcodone-acetaminophen 5-325 MG tablet Commonly known as: NORCO/VICODIN Take 1-2 tablets by mouth every 6 (six) hours as needed for moderate pain.   multivitamin with minerals tablet Take 1 tablet by mouth daily.   oxybutynin 5 MG tablet Commonly known as: DITROPAN Take 1 tablet (5 mg total) by mouth every 8 (eight) hours as needed for bladder spasms.   tamsulosin 0.4 MG Caps capsule Commonly known as: FLOMAX Take 1 capsule (0.4 mg total) by mouth daily. What changed: when to take this   traZODone 50 MG tablet Commonly known as: DESYREL TAKE 1 TABLET(50 MG) BY MOUTH AT BEDTIMETAKE 1 TABLET(50 MG) BY MOUTH AT BEDTIME Strength: 50 mg  OFFICE VISIT NEEDED FOR ADDITIONAL REFILLS        Allergies: No Known  Allergies  Family History: Family History  Problem Relation Age of Onset   Heart disease Mother    Heart disease Father    Arrhythmia Sister    Heart disease Sister     Social History:  reports that he quit smoking about 34 years ago. His smoking use included cigarettes. He has a 25.00 pack-year smoking history. He has never used smokeless tobacco. He reports that he does not currently use alcohol. He reports that he does not currently use drugs.   Physical Exam: There were no vitals taken for this visit.  Constitutional:  Alert and oriented, No acute distress. HEENT: [place] AT, moist mucus membranes.  Trachea midline, no masses. Cardiovascular: No clubbing, cyanosis, or edema. Respiratory: Normal respiratory effort, no increased work of breathing. Skin: No rashes, bruises or suspicious lesions. Neurologic: Grossly intact, no focal deficits, moving all 4 extremities. Psychiatric:  Normal mood and affect.  Laboratory Data: Lab Results  Component Value Date   CREATININE 1.06 06/26/2021   Lab Results  Component Value Date   HGBA1C 5.2 03/17/2018    Urinalysis   Pertinent Imaging: CLINICAL DATA:  Initial treatment strategy for lung nodule.   EXAM: NUCLEAR MEDICINE PET SKULL BASE TO THIGH   TECHNIQUE: 11.69 mCi F-18 FDG was injected intravenously. Full-ring PET imaging was performed from the skull base to thigh after the radiotracer. CT data was obtained and used for attenuation correction and anatomic localization.   Fasting blood glucose: 82 mg/dl   COMPARISON:  CT chest 06/13/2021 and CT AP 05/27/2021   FINDINGS: Mediastinal blood pool activity: SUV max 2.4   Liver activity: SUV max NA   NECK: No hypermetabolic lymph nodes in the neck.   Incidental CT findings: none   CHEST: Right lower lobe lung nodule measures 8 mm and has an SUV max of 0.75, image 122/3. Tiny peripheral nodule in the posterior right base measures 4 mm and is too small to characterize, image 130/3.   No tracer avid supraclavicular, axillary, mediastinal, or hilar lymph nodes.   Incidental CT findings: Mild cardiac enlargement. Previous median sternotomy and CABG procedure. Aortic atherosclerosis.   ABDOMEN/PELVIS: There is no abnormal FDG uptake within the liver, pancreas, spleen, or adrenal glands.   There is no tracer avid abdominopelvic lymph nodes. The left kidney mass is again identified. This measures approximately 4.1 cm and has an SUV max of 4.12, image 176/3   Incidental CT findings: Prostate gland is enlarged and has mass effect upon the bladder base.   SKELETON: No focal hypermetabolic activity to suggest skeletal metastasis.   Incidental CT findings: none   IMPRESSION: 1. There is no significant FDG uptake associated with the right lower lobe lung nodule. Given the small size of this nodule (8 mm) continued surveillance is recommended to confirm  stability of this nodule as certain low-grade pulmonary neoplasms as well as renal cell carcinomas may exhibit low level FDG uptake on PET-CT. 2. Left kidney mass exhibits mild to moderate increased uptake within SUV max of 4.12. No signs of tracer avid nodal metastasis or solid organ metastasis within the abdomen or pelvis.     Electronically Signed   By: Kerby Moors M.D.   On: 08/02/2021 16:27    Assessment & Plan:     No follow-ups on file.  I,Kailey Littlejohn,acting as a Education administrator for Hollice Espy, MD.,have documented all relevant documentation on the behalf of Hollice Espy, MD,as directed by  Hollice Espy, MD while in the presence of Hollice Espy,  MD.  [facility] [address], [place], [place] [address] [phone]

## 2021-08-07 ENCOUNTER — Encounter: Payer: Self-pay | Admitting: Family Medicine

## 2021-08-07 ENCOUNTER — Ambulatory Visit (INDEPENDENT_AMBULATORY_CARE_PROVIDER_SITE_OTHER): Payer: Medicare Other | Admitting: Family Medicine

## 2021-08-07 ENCOUNTER — Encounter: Payer: Medicare Other | Admitting: Urology

## 2021-08-07 ENCOUNTER — Other Ambulatory Visit: Payer: Self-pay

## 2021-08-07 VITALS — BP 130/74 | HR 72 | Ht 70.0 in | Wt 195.0 lb

## 2021-08-07 DIAGNOSIS — G4700 Insomnia, unspecified: Secondary | ICD-10-CM | POA: Diagnosis not present

## 2021-08-07 MED ORDER — TRAZODONE HCL 50 MG PO TABS: ORAL_TABLET | ORAL | 1 refills | Status: DC

## 2021-08-07 NOTE — Progress Notes (Addendum)
08/10/21 4:01 PM   David Mccullough 01-01-1946 [id number]  Referring provider:  Juline Patch, MD [address] [place],  [place] [address] Chief Complaint  Patient presents with   Follow-up    Discuss results    HPI: David Mccullough is a 76 y.o.male with a personal history of  renal mass,urinary urgency, incomplete bladder emptying, elevated PSA, microscopic hematuria, bladder cancer and pulmonary nodule who presents today for results.   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 No adenopathy or tumor thrombus in the left renal vein. 9 x 6 x 8 mm solid right lower lobe nodule. Fleischner criteria do not apply given the presence of the left renal mass. Bilateral single nonobstructive renal calculi. Tiny bladder calculi are noted and prostatomegaly.   He underwent a chest CT on 06/13/2021 that revealed solitary right lower lobe pulmonary nodule indeterminate finding in patient with left renal mass.    He is s/p renal biopsy on 06/26/2021. Surgical pathology revealed renal cell carcinoma, conventional clear-cell type, nuclear grade 2.  Cystoscopy on 06/05/2021 showed 1.5 cm relatively flat papillary tumor lateral to the left UO consisted with TCC.   He is s/p TURBT with instillation of intravesical chemotherapy on 07/28/2021.Intraoperative findings: Flat broad-based superficial appearing 1.5 cm papillary left posterior bladder wall tumor consistent with transitional cell carcinoma.  No other lesions in the bladder identified.  Mildly trabeculated bladder.  Surgical pathology of bladder specimen consistent with  non-invasive papillary urothelial carcinoma, low grade.     NM PET scan on 07/31/2021 visualized small (43mm) nodule of right lower lobe lung nodule. Left kidney mass exhibits mild to moderate increased uptake within SUV max of 4.12. No  signs of tracer avid nodal metastasis or solid organ metastasis within the abdomen or pelvis.  No previous abdominal surgeries.  PMH: Past Medical History:  Diagnosis Date   Aortic atherosclerosis ([place])    Bladder tumor 07/03/2021   a.) cystoscopy revealed 1.5 cm relatively flat papillary tumor lateral to UO; felt to be consistent with TCC   CAD (coronary artery disease) 03/15/2018   a.) NSTEMI 03/14/2018 --> LHC --> EF 50% with inferior HK; 80% pLAD, 99% OM1, 100% pRCA --> failed PCI (TIMI-1 reduced to TIMI-0); transferred to [facility] and underwent 3v CABG (LIMA-LAD, SVG-OM, SVG-RI) on 03/22/2018.   Colon polyps    Depression    Elevated PSA    Enlarged prostate    GERD (gastroesophageal reflux disease)    Hyperlipidemia    Hypertension    Nephrolithiasis    NSTEMI (non-ST elevated myocardial infarction) ([place]) 03/14/2018   a.) LHC 03/15/2018 --> 80% pLAD, 99% OM1, 100% pRCA --> failed PCI and Tx'd to [facility] for CVTS consult; underwent 3v CABG (LIMA-LAD, SVG-OM, SVG-RI) on 03/25/2018.   Persistent atrial fibrillation (HCC)    a.) CHA2DS2-VASc = 4 (age x2, HTN, prior MI). b.) rate/rhythm maintained without pharmacological intervention; no daily anticoagulation.   Renal mass, left 05/27/2021   a.) CT urogram --> 4.1 x 3.6 x 4.7 cm mass at LEFT kidney pole. b.) Bx (+) for RCC, conventional clear cell type, nuclear grade II.   Right lower lobe pulmonary nodule 05/27/2021   a.) CT chest --> measured 9 mm.   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   a.) 3v CABG: LIMA-LAD, SVG-OM, SVG-RI   SVT (supraventricular tachycardia) ([place]) 2009   a.)  s/p SVT ablation while living in [place]    Surgical History: Past Surgical History:  Procedure Laterality Date   COLONOSCOPY     CORONARY ARTERY BYPASS GRAFT N/A 03/22/2018   Procedure: CORONARY ARTERY BYPASS GRAFTING (CABG) x 3 WITH ENDOSCOPIC HARVESTING OF RIGHT GREATER SAPHENOUS VEIN;  Surgeon: Ivin Poot, MD;  Location: [place];  Service: Open  Heart Surgery;  Laterality: N/A;   CORONARY STENT INTERVENTION N/A 03/16/2018   Procedure: CORONARY STENT INTERVENTION;  Surgeon: Yolonda Kida, MD;  Location: [place] CV LAB;  Service: Cardiovascular;  Laterality: N/A;   LEFT ATRIAL APPENDAGE OCCLUSION N/A 03/22/2018   Procedure: LEFT ATRIAL APPENDAGE OCCLUSION USING 40 MM ATRICURE ATRICLIP;  Surgeon: Ivin Poot, MD;  Location: [place];  Service: Open Heart Surgery;  Laterality: N/A;   LEFT HEART CATH AND CORONARY ANGIOGRAPHY N/A 03/15/2018   Procedure: LEFT HEART CATH AND CORONARY ANGIOGRAPHY and PCI stent;  Surgeon: Yolonda Kida, MD;  Location: [place] CV LAB;  Service: Cardiovascular;  Laterality: N/A;   SVT ABLATION N/A 2009   Procedure: SVT ABLATION; Location: performed while living in [place].   TEE WITHOUT CARDIOVERSION N/A 03/22/2018   Procedure: TRANSESOPHAGEAL ECHOCARDIOGRAM (TEE);  Surgeon: Prescott Gum, Collier Salina, MD;  Location: [place];  Service: Open Heart Surgery;  Laterality: N/A;   TRANSURETHRAL RESECTION OF BLADDER TUMOR N/A 07/28/2021   Procedure: TRANSURETHRAL RESECTION OF BLADDER TUMOR (TURBT)WITH  GEMCITABINE;  Surgeon: Hollice Espy, MD;  Location: [facility];  Service: Urology;  Laterality: N/A;    Home Medications:  Allergies as of 08/08/2021   No Known Allergies      Medication List        Accurate as of August 08, 2021 11:59 PM. If you have any questions, ask your nurse or doctor.          STOP taking these medications    HYDROcodone-acetaminophen 5-325 MG tablet Commonly known as: NORCO/VICODIN Stopped by: Hollice Espy, MD       TAKE these medications    aspirin EC 81 MG tablet Take 81 mg by mouth daily. Swallow whole.   atorvastatin 80 MG tablet Commonly known as: LIPITOR TAKE 1 TABLET(80 MG) BY MOUTH DAILY AT 6 PM   calcium carbonate 500 MG chewable tablet Commonly known as: TUMS - dosed in mg elemental calcium Chew 1 tablet by mouth as needed for indigestion or  heartburn.   CALCIUM PO Take 1 tablet by mouth daily.   finasteride 5 MG tablet Commonly known as: PROSCAR Take 1 tablet (5 mg total) by mouth daily.   FISH OIL PO Take 1 tablet by mouth daily.   Gemtesa 75 MG Tabs Generic drug: Vibegron Take 75 mg by mouth daily.   multivitamin with minerals tablet Take 1 tablet by mouth daily.   oxybutynin 5 MG tablet Commonly known as: DITROPAN Take 1 tablet (5 mg total) by mouth every 8 (eight) hours as needed for bladder spasms.   tamsulosin 0.4 MG Caps capsule Commonly known as: FLOMAX Take 1 capsule (0.4 mg total) by mouth daily.   traZODone 50 MG tablet Commonly known as: DESYREL TAKE 1 TABLET(50 MG) BY MOUTH AT BEDTIME        Allergies: No Known Allergies  Family History: Family History  Problem Relation Age of Onset   Heart disease Mother    Heart disease Father    Arrhythmia Sister    Heart disease Sister     Social History:  reports that he quit smoking  about 34 years ago. His smoking use included cigarettes. He has a 25.00 pack-year smoking history. He has never been exposed to tobacco smoke. He has never used smokeless tobacco. He reports that he does not currently use alcohol. He reports that he does not currently use drugs.   Physical Exam: BP (!) 146/71    Pulse 80    Ht 6' (1.829 m)    Wt 213 lb (96.6 kg)    BMI 28.89 kg/m   Constitutional:  Alert and oriented, No acute distress.  Accompanied by his wife today. HEENT: [place] AT, moist mucus membranes.  Trachea midline, no masses. Cardiovascular: No clubbing, cyanosis, or edema. Respiratory: Normal respiratory effort, no increased work of breathing. Skin: No rashes, bruises or suspicious lesions. Neurologic: Grossly intact, no focal deficits, moving all 4 extremities. Psychiatric: Normal mood and affect.  Laboratory Data:  Lab Results  Component Value Date   CREATININE 1.06 06/26/2021   Lab Results  Component Value Date   HGBA1C 5.2 03/17/2018     Pertinent Imaging: CLINICAL DATA:  Initial treatment strategy for lung nodule.   EXAM: NUCLEAR MEDICINE PET SKULL BASE TO THIGH   TECHNIQUE: 11.69 mCi F-18 FDG was injected intravenously. Full-ring PET imaging was performed from the skull base to thigh after the radiotracer. CT data was obtained and used for attenuation correction and anatomic localization.   Fasting blood glucose: 82 mg/dl   COMPARISON:  CT chest 06/13/2021 and CT AP 05/27/2021   FINDINGS: Mediastinal blood pool activity: SUV max 2.4   Liver activity: SUV max NA   NECK: No hypermetabolic lymph nodes in the neck.   Incidental CT findings: none   CHEST: Right lower lobe lung nodule measures 8 mm and has an SUV max of 0.75, image 122/3. Tiny peripheral nodule in the posterior right base measures 4 mm and is too small to characterize, image 130/3.   No tracer avid supraclavicular, axillary, mediastinal, or hilar lymph nodes.   Incidental CT findings: Mild cardiac enlargement. Previous median sternotomy and CABG procedure. Aortic atherosclerosis.   ABDOMEN/PELVIS: There is no abnormal FDG uptake within the liver, pancreas, spleen, or adrenal glands.   There is no tracer avid abdominopelvic lymph nodes. The left kidney mass is again identified. This measures approximately 4.1 cm and has an SUV max of 4.12, image 176/3   Incidental CT findings: Prostate gland is enlarged and has mass effect upon the bladder base.   SKELETON: No focal hypermetabolic activity to suggest skeletal metastasis.   Incidental CT findings: none   IMPRESSION: 1. There is no significant FDG uptake associated with the right lower lobe lung nodule. Given the small size of this nodule (8 mm) continued surveillance is recommended to confirm stability of this nodule as certain low-grade pulmonary neoplasms as well as renal cell carcinomas may exhibit low level FDG uptake on PET-CT. 2. Left kidney mass exhibits mild to moderate  increased uptake within SUV max of 4.12. No signs of tracer avid nodal metastasis or solid organ metastasis within the abdomen or pelvis.     Electronically Signed   By: Kerby Moors M.D.   On: 08/02/2021 16:27  PET scan results reviewed personally along with the patient today.  Assessment & Plan:    1. Renal cell carcinoma of left kidney (HCC) The majority of the time was spent today talking about next steps for management of his biopsy-proven renal cell carcinoma.  PET scan indicates no metabolic activity in the solitary pulmonary nodule, will recommend  surveillance and referral to medical oncology to help continue to survey this area.  Based on the relatively ill-defined borders and fairly central nature of the left-sided tumor, recommend radical nephrectomy.  Message does not appear to be easily amenable to partial nephrectomy.  We discussed the risks of surgery today in detail including the risk of severe life-threatening bleeding, infection, damage surrounding structures, hernia, as well as progression to chronic kidney disease in the setting of solitary kidney.  We discussed the preoperative, intraoperative, and postoperative care including lifting restrictions postop.  All of his questions were answered today.  He does have a small umbilical hernia on exam today which is likely fat-containing.  Is not bothersome to him.  We will try to avoid this intraoperatively.  He does not want to have this repaired.  We previously obtain cardiac clearance, will need to hold aspirin products perioperatively.  2. Malignant neoplasm of urinary bladder, unspecified site [facility]) Surgical pathology was reviewed today, low-grade noninvasive disease  We will plan for cystoscopy in 3 months and then 6 months thereafter if there is no recurrence  3. Incomplete bladder emptying Presents for BPH and incomplete bladder emptying, will likely need an outlet procedure down the road.  In the meantime,  continue current meds   [facility] [address], [place], [place] [address] [phone]   I spent 50 total minutes on the day of the encounter including pre-visit review of the medical record, face-to-face time with the patient, and post visit ordering of labs/imaging/tests.

## 2021-08-07 NOTE — Progress Notes (Signed)
Date:  08/07/2021   Name:  David Mccullough   DOB:  03/11/1946   MRN:  [id number]   Chief Complaint: Insomnia  Insomnia Primary symptoms: no fragmented sleep, no sleep disturbance, no difficulty falling asleep, no somnolence, no frequent awakening, no premature morning awakening, no malaise/fatigue, no napping.   The current episode started more than one year. The onset quality is gradual. The problem occurs intermittently. The problem has been rapidly improving since onset. The treatment provided moderate relief.   Lab Results  Component Value Date   NA 138 06/26/2021   K 4.0 06/26/2021   CO2 28 06/26/2021   GLUCOSE 114 (H) 06/26/2021   BUN 21 06/26/2021   CREATININE 1.06 06/26/2021   CALCIUM 9.4 06/26/2021   GFRNONAA >60 06/26/2021   Lab Results  Component Value Date   CHOL 117 03/15/2019   HDL 36 (L) 03/15/2019   LDLCALC 62 03/15/2019   TRIG 100 03/15/2019   CHOLHDL 4.4 03/17/2018   Lab Results  Component Value Date   TSH 2.360 01/17/2019   Lab Results  Component Value Date   HGBA1C 5.2 03/17/2018   Lab Results  Component Value Date   WBC 6.1 06/26/2021   HGB 14.1 06/26/2021   HCT 41.7 06/26/2021   MCV 84.6 06/26/2021   PLT 179 06/26/2021   Lab Results  Component Value Date   ALT 16 03/15/2019   AST 18 03/15/2019   ALKPHOS 59 03/15/2019   BILITOT 0.6 03/15/2019   No results found for: 25OHVITD2, 25OHVITD3, VD25OH   Review of Systems  Constitutional:  Negative for chills, fever and malaise/fatigue.  HENT:  Negative for drooling, ear discharge, ear pain and sore throat.   Respiratory:  Negative for cough, shortness of breath and wheezing.   Cardiovascular:  Negative for chest pain, palpitations and leg swelling.  Gastrointestinal:  Negative for abdominal pain, blood in stool, constipation, diarrhea and nausea.  Endocrine: Negative for polydipsia.  Genitourinary:  Negative for dysuria, frequency, hematuria and urgency.  Musculoskeletal:  Negative for back  pain, myalgias and neck pain.  Skin:  Negative for rash.  Allergic/Immunologic: Negative for environmental allergies.  Neurological:  Negative for dizziness and headaches.  Hematological:  Does not bruise/bleed easily.  Psychiatric/Behavioral:  Negative for sleep disturbance and suicidal ideas. The patient has insomnia. The patient is not nervous/anxious.    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   NSTEMI (non-ST elevated myocardial infarction) ([place]) 03/14/2018    No Known Allergies  Past Surgical History:  Procedure Laterality Date   COLONOSCOPY     CORONARY ARTERY BYPASS GRAFT N/A 03/22/2018   Procedure: CORONARY ARTERY BYPASS GRAFTING (CABG) x 3 WITH ENDOSCOPIC HARVESTING OF RIGHT GREATER SAPHENOUS VEIN;  Surgeon: Ivin Poot, MD;  Location: [place];  Service: Open Heart Surgery;  Laterality: N/A;   CORONARY STENT INTERVENTION N/A 03/16/2018   Procedure: CORONARY STENT INTERVENTION;  Surgeon: Yolonda Kida, MD;  Location: [place] CV LAB;  Service: Cardiovascular;  Laterality: N/A;   LEFT ATRIAL APPENDAGE OCCLUSION N/A 03/22/2018   Procedure: LEFT ATRIAL APPENDAGE OCCLUSION USING 40 MM ATRICURE ATRICLIP;  Surgeon: Ivin Poot, MD;  Location: [place];  Service: Open Heart Surgery;  Laterality: N/A;   LEFT HEART CATH AND CORONARY ANGIOGRAPHY N/A 03/15/2018   Procedure: LEFT HEART CATH AND CORONARY ANGIOGRAPHY and PCI stent;  Surgeon: Yolonda Kida, MD;  Location: [place] INVASIVE CV  LAB;  Service: Cardiovascular;  Laterality: N/A;   SVT ABLATION N/A 2009   Procedure: SVT ABLATION; Location: performed while living in [place].   TEE WITHOUT CARDIOVERSION N/A 03/22/2018   Procedure: TRANSESOPHAGEAL ECHOCARDIOGRAM (TEE);  Surgeon: Prescott Gum, Collier Salina, MD;  Location: [place];  Service: Open Heart Surgery;  Laterality: N/A;   TRANSURETHRAL RESECTION OF BLADDER TUMOR N/A 07/28/2021   Procedure: TRANSURETHRAL  RESECTION OF BLADDER TUMOR (TURBT)WITH  GEMCITABINE;  Surgeon: Hollice Espy, MD;  Location: [facility];  Service: Urology;  Laterality: N/A;    Social History   Tobacco Use   Smoking status: Former    Packs/day: 1.00    Years: 25.00    Pack years: 25.00    Types: Cigarettes    Quit date: 06/30/1987    Years since quitting: 34.1   Smokeless tobacco: Never  Vaping Use   Vaping Use: Never used  Substance Use Topics   Alcohol use: Not Currently   Drug use: Not Currently     Medication list has been reviewed and updated.  Current Meds  Medication Sig   aspirin EC 81 MG tablet Take 81 mg by mouth daily. Swallow whole.   atorvastatin (LIPITOR) 80 MG tablet TAKE 1 TABLET(80 MG) BY MOUTH DAILY AT 6 PM (Patient taking differently: Take 80 mg by mouth every morning.)   calcium carbonate (TUMS - DOSED IN MG ELEMENTAL CALCIUM) 500 MG chewable tablet Chew 1 tablet by mouth as needed for indigestion or heartburn.   CALCIUM PO Take 1 tablet by mouth daily.   finasteride (PROSCAR) 5 MG tablet Take 1 tablet (5 mg total) by mouth daily. (Patient taking differently: Take 5 mg by mouth every morning.)   Multiple Vitamins-Minerals (MULTIVITAMIN WITH MINERALS) tablet Take 1 tablet by mouth daily.   Omega-3 Fatty Acids (FISH OIL PO) Take 1 tablet by mouth daily.   oxybutynin (DITROPAN) 5 MG tablet Take 1 tablet (5 mg total) by mouth every 8 (eight) hours as needed for bladder spasms.   tamsulosin (FLOMAX) 0.4 MG CAPS capsule Take 1 capsule (0.4 mg total) by mouth daily. (Patient taking differently: Take 0.4 mg by mouth daily after breakfast.)   traZODone (DESYREL) 50 MG tablet TAKE 1 TABLET(50 MG) BY MOUTH AT BEDTIMETAKE 1 TABLET(50 MG) BY MOUTH AT BEDTIME Strength: 50 mg  OFFICE VISIT NEEDED FOR ADDITIONAL REFILLS   Vibegron (GEMTESA) 75 MG TABS Take 75 mg by mouth daily. (Patient taking differently: Take 75 mg by mouth every morning.)    PHQ 2/9 Scores 08/07/2021 10/09/2020 08/14/2020 10/09/2019  PHQ - 2  Score 0 1 1 1   PHQ- 9 Score 0 1 2 2     GAD 7 : Generalized Anxiety Score 08/07/2021 08/14/2020  Nervous, Anxious, on Edge 0 0  Control/stop worrying 0 0  Worry too much - different things 0 2  Trouble relaxing 0 0  Restless 0 0  Easily annoyed or irritable 0 0  Afraid - awful might happen 0 0  Total GAD 7 Score 0 2  Anxiety Difficulty Not difficult at all Not difficult at all    BP Readings from Last 3 Encounters:  08/07/21 130/74  07/28/21 (!) 150/69  07/03/21 129/77    Physical Exam Vitals and nursing note reviewed.  HENT:     Head: Normocephalic.     Right Ear: Tympanic membrane, ear canal and external ear normal.     Left Ear: Tympanic membrane, ear canal and external ear normal.     Nose: Nose normal. No  congestion or rhinorrhea.  Eyes:     General: No scleral icterus.       Right eye: No discharge.        Left eye: No discharge.     Conjunctiva/sclera: Conjunctivae normal.     Pupils: Pupils are equal, round, and reactive to light.  Neck:     Thyroid: No thyromegaly.     Vascular: No JVD.     Trachea: No tracheal deviation.  Cardiovascular:     Rate and Rhythm: Normal rate and regular rhythm.     Heart sounds: Normal heart sounds. No murmur heard.   No friction rub. No gallop.  Pulmonary:     Effort: No respiratory distress.     Breath sounds: Normal breath sounds. No wheezing, rhonchi or rales.  Chest:     Chest wall: No tenderness.  Abdominal:     General: Bowel sounds are normal.     Palpations: Abdomen is soft. There is no mass.     Tenderness: There is no abdominal tenderness. There is no guarding or rebound.  Musculoskeletal:        General: No tenderness. Normal range of motion.     Cervical back: Normal range of motion and neck supple.  Lymphadenopathy:     Cervical: No cervical adenopathy.  Skin:    General: Skin is warm.     Findings: No rash.  Neurological:     Mental Status: He is alert and oriented to person, place, and time.     Cranial  Nerves: No cranial nerve deficit.     Deep Tendon Reflexes: Reflexes are normal and symmetric.    Wt Readings from Last 3 Encounters:  08/07/21 195 lb (88.5 kg)  07/28/21 210 lb (95.3 kg)  07/03/21 210 lb (95.3 kg)    BP 130/74    Pulse 72    Ht 5\' 10"  (1.778 m)    Wt 195 lb (88.5 kg)    SpO2 97%    BMI 27.98 kg/m   Assessment and Plan:  1. Insomnia, unspecified type Chronic.  Controlled.  Stable.  Continue trazodone 50 mg 1 tablet nightly we will recheck in 6 months. - traZODone (DESYREL) 50 MG tablet; TAKE 1 TABLET(50 MG) BY MOUTH AT BEDTIME  Dispense: 90 tablet; Refill: 1

## 2021-08-08 ENCOUNTER — Ambulatory Visit (INDEPENDENT_AMBULATORY_CARE_PROVIDER_SITE_OTHER): Payer: Medicare Other | Admitting: Urology

## 2021-08-08 ENCOUNTER — Encounter: Payer: Self-pay | Admitting: Urology

## 2021-08-08 VITALS — BP 146/71 | HR 80 | Ht 72.0 in | Wt 213.0 lb

## 2021-08-08 DIAGNOSIS — R911 Solitary pulmonary nodule: Secondary | ICD-10-CM | POA: Diagnosis not present

## 2021-08-08 DIAGNOSIS — R339 Retention of urine, unspecified: Secondary | ICD-10-CM | POA: Diagnosis not present

## 2021-08-08 DIAGNOSIS — C679 Malignant neoplasm of bladder, unspecified: Secondary | ICD-10-CM | POA: Diagnosis not present

## 2021-08-08 DIAGNOSIS — C642 Malignant neoplasm of left kidney, except renal pelvis: Secondary | ICD-10-CM

## 2021-08-10 ENCOUNTER — Telehealth: Payer: Self-pay | Admitting: Urology

## 2021-08-10 ENCOUNTER — Encounter: Payer: Self-pay | Admitting: Urology

## 2021-08-10 NOTE — Telephone Encounter (Signed)
Surgical Physician Order Form [facility] [place]  * Scheduling expectation : Next Available  *Length of Case:   *Clearance needed: Should have cardiac clearance already from TURBT, if not will need  *Anticoagulation Instructions: Hold all anticoagulants  *Aspirin Instructions: Hold Aspirin  *Post-op visit Date/Instructions:  1 month follow up  *Diagnosis:  Left renal cell carcinoma  *Procedure: left Laparoscopic radical nephrectomy (robot or hand assist)(50545) Hand assist  Additional orders: N/A  -Admit type: INpatient  -Anesthesia: General  -VTE Prophylaxis Standing Order SCDs       Other:   -Standing Lab Orders Per Anesthesia    Lab other: UA&Urine Culture, cbc, bmp, INR, T&S  -Standing Test orders EKG/Chest x-ray per Anesthesia       Test other:   - Medications:  Ancef 2gm IV  -Other orders:  N/A

## 2021-08-12 ENCOUNTER — Encounter: Payer: Self-pay | Admitting: *Deleted

## 2021-08-15 ENCOUNTER — Other Ambulatory Visit: Payer: Self-pay

## 2021-08-15 DIAGNOSIS — C642 Malignant neoplasm of left kidney, except renal pelvis: Secondary | ICD-10-CM

## 2021-08-15 DIAGNOSIS — R300 Dysuria: Secondary | ICD-10-CM

## 2021-08-15 NOTE — Telephone Encounter (Signed)
I spoke with [name]. We have discussed possible surgery dates and Monday March 13th, 2023 was agreed upon by all parties. Patient given information about surgery date, what to expect pre-operatively and post operatively.   We discussed that a Pre-Admission Testing office will be calling to set up the pre-op visit that will take place prior to surgery, and that these appointments are typically done over the phone with a Pre-Admissions RN. Informed patient that our office will communicate any additional care to be provided after surgery.   Patients questions or concerns were discussed during our call. Advised to call our office should there be any additional information, questions or concerns that arise. Patient verbalized understanding.

## 2021-08-15 NOTE — Progress Notes (Signed)
[facility] Posting Form   Surgery Date/Time: Date: 09/08/2021  Surgeon: Dr. Hollice Espy, MD  Surgery Location: Day Surgery  Inpt ( Yes  )   Outpt (No)   Obs ( No  )   Diagnosis: Left Renal Cell Carcinoma C64.2  -CPT: 52080  Surgery: Left Laparoscopic Hand Assisted Radical Nephrectomy   Stop Anticoagulations: Yes and hold ASA  Cardiac/Medical/Pulmonary Clearance needed: Clearance received 01/24.  *Orders entered into EPIC  Date: 08/15/21   *Case booked in EPIC  Date: 08/12/2021  *Notified pt of Surgery: Date: 08/12/2021  PRE-OP UA & CX: Yes, as well as cbc, inr, bmp, and type and screen  *Placed into Prior Authorization Work Que Date: 08/15/21   Assistant/laser/rep:No

## 2021-08-18 DIAGNOSIS — C642 Malignant neoplasm of left kidney, except renal pelvis: Secondary | ICD-10-CM | POA: Insufficient documentation

## 2021-08-18 NOTE — Progress Notes (Signed)
[name]  Telephone:([phone]) [phone] [phone]) [phone]  ID: David Mccullough OB: 1945/08/21  MR#: [id number]  CNO#:[id number]  Patient Care Team: Juline Patch, MD as PCP - General (Family Medicine) Hollice Espy, MD as Consulting Physician (Urology) Yolonda Kida, MD as Consulting Physician (Cardiology) Lloyd Huger, MD as Consulting Physician (Oncology)  CHIEF COMPLAINT: Left renal cell carcinoma.  INTERVAL HISTORY: Patient is a 76 year old male recently diagnosed with left renal cell carcinoma who has plans to undergo a total nephrectomy on September 23, 2021.  Incidentally, he was noted to have a PET negative right lower lobe lung nodule measuring 8 mm. He currently feels well and is asymptomatic.  He has no neurologic complaints.  He denies any recent fevers or illnesses.  He has a good appetite and denies weight loss.  He has no chest pain, shortness of breath, cough, or hemoptysis.  He denies any nausea, vomiting, constipation, or diarrhea.  He has no urinary complaints.  Patient feels at his baseline offers no specific complaints today.  REVIEW OF SYSTEMS:   Review of Systems  Constitutional: Negative.  Negative for fever, malaise/fatigue and weight loss.  Respiratory: Negative.  Negative for cough, hemoptysis and shortness of breath.   Cardiovascular: Negative.  Negative for chest pain and leg swelling.  Gastrointestinal: Negative.  Negative for abdominal pain.  Genitourinary: Negative.  Negative for dysuria.  Musculoskeletal: Negative.  Negative for back pain.  Skin: Negative.  Negative for rash.  Neurological: Negative.  Negative for dizziness, focal weakness, weakness and headaches.  Psychiatric/Behavioral: Negative.  The patient is not nervous/anxious.    As per HPI. Otherwise, a complete review of systems is negative.  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   Colon polyps    Depression    Elevated PSA    Enlarged prostate    GERD (gastroesophageal reflux disease)    Hyperlipidemia    Hypertension    Nephrolithiasis    NSTEMI (non-ST elevated myocardial infarction) ([place]) 03/14/2018   a.) LHC 03/15/2018 --> 80% pLAD, 99% OM1, 100% pRCA --> failed PCI and Tx'd to [facility] for CVTS consult; underwent 3v CABG (LIMA-LAD, SVG-OM, SVG-RI) on 03/25/2018.   Persistent atrial fibrillation (HCC)    a.) CHA2DS2-VASc = 4 (age x2, HTN, prior MI). b.) rate/rhythm maintained without pharmacological intervention; no daily anticoagulation.   Renal cell cancer, left (HCC)    Renal mass, left 05/27/2021   a.) CT urogram --> 4.1 x 3.6 x 4.7 cm mass at LEFT kidney pole. b.) Bx (+) for RCC, conventional clear cell type, nuclear grade II.   Right lower lobe pulmonary nodule 05/27/2021   a.) CT chest --> measured 9 mm.   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   a.) 3v CABG: LIMA-LAD, SVG-OM, SVG-RI   SVT (supraventricular tachycardia) ([place]) 2009   a.) s/p SVT ablation while living in [place]: Past Surgical History:  Procedure Laterality Date   COLONOSCOPY     CORONARY ARTERY BYPASS GRAFT N/A 03/22/2018  Procedure: CORONARY ARTERY BYPASS GRAFTING (CABG) x 3 WITH ENDOSCOPIC HARVESTING OF RIGHT GREATER SAPHENOUS VEIN;  Surgeon: Ivin Poot, MD;  Location: [facility];  Service: Open Heart Surgery;  Laterality: N/A;   CORONARY STENT INTERVENTION N/A 03/16/2018   Procedure: CORONARY STENT INTERVENTION;  Surgeon: Yolonda Kida, MD;  Location: [facility];  Service:  Cardiovascular;  Laterality: N/A;   LEFT ATRIAL APPENDAGE OCCLUSION N/A 03/22/2018   Procedure: LEFT ATRIAL APPENDAGE OCCLUSION USING 40 MM ATRICURE ATRICLIP;  Surgeon: Ivin Poot, MD;  Location: [facility];  Service: Open Heart Surgery;  Laterality: N/A;   LEFT HEART CATH AND CORONARY ANGIOGRAPHY N/A 03/15/2018   Procedure: LEFT HEART CATH AND CORONARY ANGIOGRAPHY and PCI stent;  Surgeon: Yolonda Kida, MD;  Location: [facility];  Service: Cardiovascular;  Laterality: N/A;   SVT ABLATION N/A 2009   Procedure: SVT ABLATION; Location: performed while living in [place].   TEE WITHOUT CARDIOVERSION N/A 03/22/2018   Procedure: TRANSESOPHAGEAL ECHOCARDIOGRAM (TEE);  Surgeon: Prescott Gum, Collier Salina, MD;  Location: [facility];  Service: Open Heart Surgery;  Laterality: N/A;   TRANSURETHRAL RESECTION OF BLADDER TUMOR N/A 07/28/2021   Procedure: TRANSURETHRAL RESECTION OF BLADDER TUMOR (TURBT)WITH  GEMCITABINE;  Surgeon: Hollice Espy, MD;  Location: [facility];  Service: Urology;  Laterality: N/A;    FAMILY HISTORY: Family History  Problem Relation Age of Onset   Heart disease Mother    Heart disease Father    Arrhythmia Sister    Heart disease Sister     ADVANCED DIRECTIVES (Y/N):  N  HEALTH MAINTENANCE: Social History   Tobacco Use   Smoking status: Former    Packs/day: 1.00    Years: 25.00    Pack years: 25.00    Types: Cigarettes    Quit date: 06/30/1987    Years since quitting: 34.1    Passive exposure: Never   Smokeless tobacco: Never  Vaping Use   Vaping Use: Never used  Substance Use Topics   Alcohol use: Not Currently   Drug use: Not Currently     Colonoscopy:  PAP:  Bone density:  Lipid panel:  No Known Allergies  Current Outpatient Medications  Medication Sig Dispense Refill   aspirin EC 81 MG tablet Take 81 mg by mouth daily. Swallow whole.     atorvastatin (LIPITOR) 80 MG tablet TAKE 1 TABLET(80 MG) BY MOUTH DAILY AT 6 PM 30 tablet 2   calcium carbonate  (TUMS - DOSED IN MG ELEMENTAL CALCIUM) 500 MG chewable tablet Chew 1 tablet by mouth as needed for indigestion or heartburn.     CALCIUM PO Take 1 tablet by mouth daily.     finasteride (PROSCAR) 5 MG tablet Take 1 tablet (5 mg total) by mouth daily. 90 tablet 3   Multiple Vitamins-Minerals (MULTIVITAMIN WITH MINERALS) tablet Take 1 tablet by mouth daily.     Omega-3 Fatty Acids (FISH OIL PO) Take 1 tablet by mouth daily.     tamsulosin (FLOMAX) 0.4 MG CAPS capsule Take 1 capsule (0.4 mg total) by mouth daily. 90 capsule 3   traZODone (DESYREL) 50 MG tablet TAKE 1 TABLET(50 MG) BY MOUTH AT BEDTIME 90 tablet 1   Vibegron (GEMTESA) 75 MG TABS Take 75 mg by mouth daily. 30 tablet 11   No current facility-administered medications for this visit.    OBJECTIVE: Vitals:   08/19/21 1100  BP: 119/74  Pulse: 82  Resp: 16  Temp: (!) 97.1 F (36.2 C)  Body mass index is 28.67 kg/m.    ECOG FS:0 - Asymptomatic  General: Well-developed, well-nourished, no acute distress. Eyes: Pink conjunctiva, anicteric sclera. HEENT: Normocephalic, moist mucous membranes. Lungs: No audible wheezing or coughing. Heart: Regular rate and rhythm. Abdomen: Soft, nontender, no obvious distention. Musculoskeletal: No edema, cyanosis, or clubbing. Neuro: Alert, answering all questions appropriately. Cranial nerves grossly intact. Skin: No rashes or petechiae noted. Psych: Normal affect. Lymphatics: No cervical, calvicular, axillary or inguinal LAD.   LAB RESULTS:  Lab Results  Component Value Date   NA 138 06/26/2021   K 4.0 06/26/2021   CL 106 06/26/2021   CO2 28 06/26/2021   GLUCOSE 114 (H) 06/26/2021   BUN 21 06/26/2021   CREATININE 1.06 06/26/2021   CALCIUM 9.4 06/26/2021   PROT 6.6 03/15/2019   ALBUMIN 4.4 03/15/2019   AST 18 03/15/2019   ALT 16 03/15/2019   ALKPHOS 59 03/15/2019   BILITOT 0.6 03/15/2019   GFRNONAA >60 06/26/2021   GFRAA >60 04/25/2018    Lab Results  Component Value  Date   WBC 6.1 06/26/2021   NEUTROABS 4.0 06/26/2021   HGB 14.1 06/26/2021   HCT 41.7 06/26/2021   MCV 84.6 06/26/2021   PLT 179 06/26/2021     STUDIES: NM PET Image Restage (PS) Skull Base to Thigh (F-18 FDG)  Result Date: 08/02/2021 CLINICAL DATA:  Initial treatment strategy for lung nodule. EXAM: NUCLEAR MEDICINE PET SKULL BASE TO THIGH TECHNIQUE: 11.69 mCi F-18 FDG was injected intravenously. Full-ring PET imaging was performed from the skull base to thigh after the radiotracer. CT data was obtained and used for attenuation correction and anatomic localization. Fasting blood glucose: 82 mg/dl COMPARISON:  CT chest 06/13/2021 and CT AP 05/27/2021 FINDINGS: Mediastinal blood pool activity: SUV max 2.4 Liver activity: SUV max NA NECK: No hypermetabolic lymph nodes in the neck. Incidental CT findings: none CHEST: Right lower lobe lung nodule measures 8 mm and has an SUV max of 0.75, image 122/3. Tiny peripheral nodule in the posterior right base measures 4 mm and is too small to characterize, image 130/3. No tracer avid supraclavicular, axillary, mediastinal, or hilar lymph nodes. Incidental CT findings: Mild cardiac enlargement. Previous median sternotomy and CABG procedure. Aortic atherosclerosis. ABDOMEN/PELVIS: There is no abnormal FDG uptake within the liver, pancreas, spleen, or adrenal glands. There is no tracer avid abdominopelvic lymph nodes. The left kidney mass is again identified. This measures approximately 4.1 cm and has an SUV max of 4.12, image 176/3 Incidental CT findings: Prostate gland is enlarged and has mass effect upon the bladder base. SKELETON: No focal hypermetabolic activity to suggest skeletal metastasis. Incidental CT findings: none IMPRESSION: 1. There is no significant FDG uptake associated with the right lower lobe lung nodule. Given the small size of this nodule (8 mm) continued surveillance is recommended to confirm stability of this nodule as certain low-grade pulmonary  neoplasms as well as renal cell carcinomas may exhibit low level FDG uptake on PET-CT. 2. Left kidney mass exhibits mild to moderate increased uptake within SUV max of 4.12. No signs of tracer avid nodal metastasis or solid organ metastasis within the abdomen or pelvis. Electronically Signed   By: Kerby Moors M.D.   On: 08/02/2021 16:27    ASSESSMENT: Left renal cell carcinoma.  PLAN:    Left renal cell carcinoma: Confirmed by biopsy.  Patient plans to undergo a nephrectomy on September 08, 2021.  Continue follow-up with urology as scheduled. Right lower lobe pulmonary nodule: PET scan  results and CT scan results reviewed independently and report as above.  Lesion is only 8 mm in size and PET negative.  No intervention is needed at this time.  Repeat CT of the chest, abdomen, pelvis in 6 months to assess for interval change.  I spent a total of 45 minutes reviewing chart data, face-to-face evaluation with the patient, counseling and coordination of care as detailed above.  Patient expressed understanding and was in agreement with this plan. He also understands that He can call clinic at any time with any questions, concerns, or complaints.    Cancer Staging  Renal cell adenocarcinoma, left [facility]) Staging form: Kidney, AJCC 8th Edition - Clinical stage from 08/19/2021: Stage I (cT1b, cN0, cM0) - Signed by Lloyd Huger, MD on 08/19/2021 Stage prefix: Initial diagnosis   Lloyd Huger, MD   08/19/2021 1:31 PM

## 2021-08-19 ENCOUNTER — Encounter: Payer: Self-pay | Admitting: Oncology

## 2021-08-19 ENCOUNTER — Inpatient Hospital Stay: Payer: Medicare Other | Attending: Oncology | Admitting: Oncology

## 2021-08-19 ENCOUNTER — Inpatient Hospital Stay: Payer: Medicare Other

## 2021-08-19 ENCOUNTER — Other Ambulatory Visit: Payer: Self-pay

## 2021-08-19 DIAGNOSIS — C642 Malignant neoplasm of left kidney, except renal pelvis: Secondary | ICD-10-CM | POA: Insufficient documentation

## 2021-08-19 DIAGNOSIS — R911 Solitary pulmonary nodule: Secondary | ICD-10-CM | POA: Insufficient documentation

## 2021-08-19 NOTE — Progress Notes (Signed)
Patient here for initial visit he is anxious because of disease process.

## 2021-08-27 ENCOUNTER — Ambulatory Visit: Payer: Medicare Other | Admitting: Urology

## 2021-09-05 ENCOUNTER — Other Ambulatory Visit: Payer: Self-pay

## 2021-09-05 ENCOUNTER — Encounter
Admission: RE | Admit: 2021-09-05 | Discharge: 2021-09-05 | Disposition: A | Discharge status: Home / Self Care | Payer: Medicare Other | Source: Ambulatory Visit | Attending: Urology | Admitting: Urology

## 2021-09-05 ENCOUNTER — Encounter: Payer: Self-pay | Admitting: Urology

## 2021-09-05 DIAGNOSIS — Z01812 Encounter for preprocedural laboratory examination: Secondary | ICD-10-CM | POA: Insufficient documentation

## 2021-09-05 DIAGNOSIS — Z20822 Contact with and (suspected) exposure to covid-19: Secondary | ICD-10-CM | POA: Diagnosis present

## 2021-09-05 DIAGNOSIS — F32A Depression, unspecified: Secondary | ICD-10-CM | POA: Diagnosis not present

## 2021-09-05 DIAGNOSIS — I2510 Atherosclerotic heart disease of native coronary artery without angina pectoris: Secondary | ICD-10-CM | POA: Diagnosis present

## 2021-09-05 DIAGNOSIS — I4819 Other persistent atrial fibrillation: Secondary | ICD-10-CM | POA: Diagnosis present

## 2021-09-05 DIAGNOSIS — I10 Essential (primary) hypertension: Secondary | ICD-10-CM | POA: Diagnosis present

## 2021-09-05 DIAGNOSIS — Z8551 Personal history of malignant neoplasm of bladder: Secondary | ICD-10-CM | POA: Diagnosis not present

## 2021-09-05 DIAGNOSIS — K219 Gastro-esophageal reflux disease without esophagitis: Secondary | ICD-10-CM | POA: Diagnosis present

## 2021-09-05 DIAGNOSIS — Z86010 Personal history of colonic polyps: Secondary | ICD-10-CM | POA: Diagnosis not present

## 2021-09-05 DIAGNOSIS — Z951 Presence of aortocoronary bypass graft: Secondary | ICD-10-CM | POA: Diagnosis not present

## 2021-09-05 DIAGNOSIS — R300 Dysuria: Secondary | ICD-10-CM | POA: Insufficient documentation

## 2021-09-05 DIAGNOSIS — C642 Malignant neoplasm of left kidney, except renal pelvis: Secondary | ICD-10-CM

## 2021-09-05 DIAGNOSIS — N2889 Other specified disorders of kidney and ureter: Secondary | ICD-10-CM | POA: Diagnosis present

## 2021-09-05 DIAGNOSIS — Z87442 Personal history of urinary calculi: Secondary | ICD-10-CM | POA: Diagnosis not present

## 2021-09-05 DIAGNOSIS — R911 Solitary pulmonary nodule: Secondary | ICD-10-CM | POA: Diagnosis present

## 2021-09-05 DIAGNOSIS — Z8249 Family history of ischemic heart disease and other diseases of the circulatory system: Secondary | ICD-10-CM | POA: Diagnosis not present

## 2021-09-05 DIAGNOSIS — I252 Old myocardial infarction: Secondary | ICD-10-CM | POA: Diagnosis not present

## 2021-09-05 DIAGNOSIS — R3914 Feeling of incomplete bladder emptying: Secondary | ICD-10-CM | POA: Diagnosis present

## 2021-09-05 DIAGNOSIS — E785 Hyperlipidemia, unspecified: Secondary | ICD-10-CM | POA: Diagnosis present

## 2021-09-05 DIAGNOSIS — Z87891 Personal history of nicotine dependence: Secondary | ICD-10-CM | POA: Diagnosis not present

## 2021-09-05 DIAGNOSIS — N401 Enlarged prostate with lower urinary tract symptoms: Secondary | ICD-10-CM | POA: Diagnosis present

## 2021-09-05 DIAGNOSIS — K429 Umbilical hernia without obstruction or gangrene: Secondary | ICD-10-CM | POA: Diagnosis present

## 2021-09-05 LAB — CBC
HCT: 42.3 % (ref 39.0–52.0)
Hemoglobin: 14.1 g/dL (ref 13.0–17.0)
MCH: 28.1 pg (ref 26.0–34.0)
MCHC: 33.3 g/dL (ref 30.0–36.0)
MCV: 84.3 fL (ref 80.0–100.0)
Platelets: 148 10*3/uL — ABNORMAL LOW (ref 150–400)
RBC: 5.02 MIL/uL (ref 4.22–5.81)
RDW: 13.2 % (ref 11.5–15.5)
WBC: 6.5 10*3/uL (ref 4.0–10.5)
nRBC: 0 % (ref 0.0–0.2)

## 2021-09-05 LAB — URINALYSIS, COMPLETE (UACMP) WITH MICROSCOPIC
Bilirubin Urine: NEGATIVE
Glucose, UA: NEGATIVE mg/dL
Hgb urine dipstick: NEGATIVE
Ketones, ur: NEGATIVE mg/dL
Nitrite: NEGATIVE
Protein, ur: NEGATIVE mg/dL
Specific Gravity, Urine: 1.012 (ref 1.005–1.030)
pH: 6 (ref 5.0–8.0)

## 2021-09-05 LAB — BASIC METABOLIC PANEL
Anion gap: 8 (ref 5–15)
BUN: 21 mg/dL (ref 8–23)
CO2: 24 mmol/L (ref 22–32)
Calcium: 9.5 mg/dL (ref 8.9–10.3)
Chloride: 107 mmol/L (ref 98–111)
Creatinine, Ser: 1.08 mg/dL (ref 0.61–1.24)
GFR, Estimated: >60 mL/min (ref >60)
Glucose, Bld: 100 mg/dL — ABNORMAL HIGH (ref 70–99)
Potassium: 3.7 mmol/L (ref 3.5–5.1)
Sodium: 139 mmol/L (ref 135–145)

## 2021-09-05 LAB — TYPE AND SCREEN
ABO/RH(D): B POS
Antibody Screen: NEGATIVE

## 2021-09-05 LAB — PROTIME-INR
INR: 1 (ref 0.8–1.2)
Prothrombin Time: 12.9 seconds (ref 11.4–15.2)

## 2021-09-05 LAB — SARS CORONAVIRUS 2 (TAT 6-24 HRS): SARS Coronavirus 2: NEGATIVE

## 2021-09-05 NOTE — Progress Notes (Addendum)
Perioperative Services  Pre-Admission/Anesthesia Testing Clinical Review  Date: 09/05/21  Patient Demographics:  Name: David Mccullough DOB:   Mar 19, 1946 MRN:   [id number]  Planned Surgical Procedure(s):    Case: [id number] Date/Time: 09/08/21 0859   Procedure: HAND ASSISTED LAPAROSCOPIC NEPHRECTOMY (Left)   Anesthesia type: Choice   Pre-op diagnosis: Left Renal Cell Carcinoma   Location: [facility]   Surgeons: Hollice Espy, MD   NOTE: Available PAT nursing documentation and vital signs have been reviewed. Clinical nursing staff has updated patient's PMH/PSHx, current medication list, and drug allergies/intolerances to ensure comprehensive history available to assist in medical decision making as it pertains to the aforementioned surgical procedure and anticipated anesthetic course. Extensive review of available clinical information performed. [place] PMH and PSHx updated with any diagnoses/procedures that  may have been inadvertently omitted during his intake with the pre-admission testing department's nursing staff.  Clinical Discussion:  David Mccullough is a 76 y.o. male who is submitted for pre-surgical anesthesia review and clearance prior to him undergoing the above procedure. Patient is a Former Smoker (25 pack years; quit 06/1987). Pertinent PMH includes: CAD (s/p CABG), NSTEMI, atrial fibrillation, SVT (s/p ablation), aortic atherosclerosis, HTN, HLD, RIGHT solitary pulmonary nodule, GERD (uses CaCO3 tablets PRN), papillary bladder tumor, LEFT renal mass (Bx proven RCC), nephrolithiasis, depression.  Patient is followed by cardiology Clayborn Bigness, MD). He was last seen in the cardiology clinic on 03/26/2021; notes reviewed. At the time of his clinic visit, the patient denied any chest pain, shortness of breath, PND, orthopnea, palpitations, significant peripheral edema, vertiginous symptoms, or presyncope/syncope.  Patient with a PMH significant for  cardiovascular diagnoses. Of note, patient formally lived outside of the state of [place], therefore records regarding history are limited. History obtained from local primary cardiologist and patient.  PMH significant for atrial flutter. He underwent an ablation procedure while living in [place] in approximately 2009. Since ablation, patient denies recurrence.   Patient suffered an NSTEMI on 03/14/2018. He underwent a diagnostic left heart catheterization on 03/15/2018 that revealed an ef of 50% with inferior hypokinesis. There was multivessel CAD noted; 80% pLAD, 99% OM1, and 100% pRCA. PCI was attempted, however failed. Initially there was TIMI-1 flow, however during interventional attempt, flow degraded to TIMI-0. Patient was transferred to [facility] for consultation with CVTS regarding high risk PCI vs. CABG.   Patient ultimately underwent a 3 vessel CABG on 03/22/2018. LIMA-LAD, SVG-OM, and SVG-RI bypass grafts were placed.   Last TTE was performed on 03/24/2018 revealing a normal left ventricular systolic function; EF 21-30%. Diastolic function parameters normal. There was mild mitral valve regurgitation. LA was moderately dilated. PASP elevated at 37 mmHg. There was no evident of a significant transvalvular gradient to suggest mitral or aortic valve stenosis.   Patient with a history of PAF; CHA2DS2-VASc Score = 4 (age x 2, HTN, prior MI). He does not require pharmacological intervention for this diagnosis; no beta blockers, antiarrhythmics, or daily anticoagulation. Patient is on a statin + omega 3 fatty acid for his HLD and further ASCVD prevention. Patient is not diabetic. Functional capacity, as defined by DASI, is documented as being >/= 4 METS. No changes were made to his medication regimen. Patient to follow up with outpatient cardiology in 1 year or sooner if needed.   David Mccullough found to have a 4.1 x 3.6 x 4.7 cm mass located at the pole of his LEFT kidney. 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  Patient underwent TURBT on 07/28/2021. 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 There was no muscularis propria involvement. PET CT done on 07/31/2021 revealed FDG avid LEFT renal mass (SUV 4.12), thus patient was scheduled for laparoscopic nephrectomy.  Procedure is scheduled for 09/08/2021 with Dr. Hollice Espy, MD. Given patient's past medical history significant for cardiovascular diagnoses, presurgical cardiac clearance was sought by the PAT team. Per cardiology, "this patient is optimized for surgery and may proceed with the planned procedural course with a LOW risk of significant perioperative cardiovascular complications".  In review of his medication reconciliation, it is noted the patient is on daily antiplatelet therapy.  He has been instructed on recommendations from his cardiologist and surgeon for holding his daily low-dose ASA prior to his procedure.   Patient denies previous perioperative complications with anesthesia in the past. In review of the available records, it is noted that patient underwent a general anesthetic course here (ASA III) in 06/2021 without documented complications.   Vitals with BMI 08/19/2021 08/08/2021 08/07/2021  Height - '6\' 0"'$  '5\' 10"'$   Weight 211 lbs 6 oz 213 lbs 195 lbs  BMI 28.66 21.19 41.74  Systolic 081 448 185  Diastolic 74 71 74  Pulse 82 80 72    Providers/Specialists:   NOTE: Primary physician provider listed below. Patient may have been seen by APP or partner within same practice.   PROVIDER ROLE / SPECIALTY LAST Lu Duffel, MD Urology (Surgeon) 08/08/2021  Juline Patch, MD Primary Care Provider 08/07/2021  Katrine Coho, MD Cardiology 03/26/2021  Delight Hoh, MD Oncology 08/19/2021    Allergies:  Patient  has no known allergies.  Current Home Medications:   No current facility-administered medications for this encounter.    aspirin EC 81 MG tablet   atorvastatin (LIPITOR) 80 MG tablet   calcium carbonate (TUMS - DOSED IN MG ELEMENTAL CALCIUM) 500 MG chewable tablet   finasteride (PROSCAR) 5 MG tablet   Multiple Vitamins-Minerals (MULTIVITAMIN WITH MINERALS) tablet   Omega-3 Fatty Acids (FISH OIL PO)   tamsulosin (FLOMAX) 0.4 MG CAPS capsule   traZODone (DESYREL) 50 MG tablet   Vibegron (GEMTESA) 75 MG TABS   History:   Past Medical History:  Diagnosis Date   Aortic atherosclerosis (HCC)    Bladder tumor 07/03/2021   a.) cystoscopy revealed 1.5 cm relatively flat papillary tumor lateral to UO (LEFT); felt to be consistent with TCC   CAD (coronary artery disease) 03/15/2018   a.) NSTEMI 03/14/2018 --> LHC --> EF 50% with inferior HK; 80% pLAD, 99% OM1, 100% pRCA --> failed PCI (TIMI-1 reduced to TIMI-0); transferred to [facility] and underwent 3v CABG (LIMA-LAD, SVG-OM, SVG-RI) on 03/22/2018.   Colon polyps    Depression    Elevated PSA    Enlarged prostate    GERD (gastroesophageal reflux disease)    Hyperlipidemia    Hypertension    Nephrolithiasis    NSTEMI (non-ST elevated myocardial infarction) ([place]) 03/14/2018   a.) LHC 03/15/2018 --> 80% pLAD, 99% OM1, 100% pRCA --> failed PCI and Tx'd to [facility] for CVTS consult; underwent 3v CABG (LIMA-LAD, SVG-OM, SVG-RI) on 03/25/2018.   Persistent atrial fibrillation (HCC)    a.) CHA2DS2-VASc = 4 (age x2, HTN, prior MI). b.) rate/rhythm maintained without pharmacological intervention; no daily anticoagulation.   Renal cell cancer, left (  [place]) 05/27/2021   a.) CT urogram --> 4.1 x 3.6 x 4.7 cm mass at LEFT kidney pole. b.) CNB (+) for RCC, conventional clear cell type, nuclear grade II. 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 d.) PET CT  07/31/2021 revealed FDG avid LEFT renal mass (SUV 4.12); scheduled for nephrectomy.   Right lower lobe pulmonary nodule 05/27/2021   a.) CT chest --> measured 9 mm.   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   a.) 3v CABG: LIMA-LAD, SVG-OM, SVG-RI   SVT (supraventricular tachycardia) ([place]) 2009   a.) s/p SVT ablation while living in [place]   Past Surgical History:  Procedure Laterality Date   COLONOSCOPY     CORONARY ARTERY BYPASS GRAFT N/A 03/22/2018   Procedure: CORONARY ARTERY BYPASS GRAFTING (CABG) x 3 WITH ENDOSCOPIC HARVESTING OF RIGHT GREATER SAPHENOUS VEIN;  Surgeon: Ivin Poot, MD;  Location: [facility];  Service: Open Heart Surgery;  Laterality: N/A;   CORONARY STENT INTERVENTION N/A 03/16/2018   Procedure: CORONARY STENT INTERVENTION;  Surgeon: Yolonda Kida, MD;  Location: [place] [facility];  Service: Cardiovascular;  Laterality: N/A;   LEFT ATRIAL APPENDAGE OCCLUSION N/A 03/22/2018   Procedure: LEFT ATRIAL APPENDAGE OCCLUSION USING 40 MM ATRICURE ATRICLIP;  Surgeon: Ivin Poot, MD;  Location: [place];  Service: Open Heart Surgery;  Laterality: N/A;   LEFT HEART CATH AND CORONARY ANGIOGRAPHY N/A 03/15/2018   Procedure: LEFT HEART CATH AND CORONARY ANGIOGRAPHY and PCI stent;  Surgeon: Yolonda Kida, MD;  Location: [place] [facility];  Service: Cardiovascular;  Laterality: N/A;   SVT ABLATION N/A 2009   Procedure: SVT ABLATION; Location: performed while living in [place].   TEE WITHOUT CARDIOVERSION N/A 03/22/2018   Procedure: TRANSESOPHAGEAL ECHOCARDIOGRAM (TEE);  Surgeon: Prescott Gum, Collier Salina, MD;  Location: [facility];  Service: Open Heart Surgery;  Laterality: N/A;   TRANSURETHRAL RESECTION OF BLADDER TUMOR N/A 07/28/2021   Procedure: TRANSURETHRAL RESECTION OF BLADDER TUMOR (TURBT)WITH  GEMCITABINE;  Surgeon: Hollice Espy, MD;  Location: [facility];  Service: Urology;  Laterality: N/A;   Family History  Problem Relation Age of Onset   Heart disease Mother    Heart disease  Father    Arrhythmia Sister    Heart disease Sister    Social History   Tobacco Use   Smoking status: Former    Packs/day: 1.00    Years: 25.00    Pack years: 25.00    Types: Cigarettes    Quit date: 06/30/1987    Years since quitting: 34.2    Passive exposure: Never   Smokeless tobacco: Never  Vaping Use   Vaping Use: Never used  Substance Use Topics   Alcohol use: Not Currently   Drug use: Not Currently    Pertinent Clinical Results:  LABS: Labs reviewed: Acceptable for surgery.  Hospital Outpatient Visit on 09/05/2021  Component Date Value Ref Range Status   WBC 09/05/2021 6.5  4.0 - 10.5 K/uL Final   RBC 09/05/2021 5.02  4.22 - 5.81 MIL/uL Final   Hemoglobin 09/05/2021 14.1  13.0 - 17.0 g/dL Final   HCT 09/05/2021 42.3  39.0 - 52.0 % Final   MCV 09/05/2021 84.3  80.0 - 100.0 fL Final   MCH 09/05/2021 28.1  26.0 - 34.0 pg Final   MCHC 09/05/2021 33.3  30.0 - 36.0 g/dL Final   RDW 09/05/2021 13.2  11.5 - 15.5 % Final   Platelets 09/05/2021 148 (  L)  150 - 400 K/uL Final   nRBC 09/05/2021 0.0  0.0 - 0.2 % Final   Performed at [facility], [place], [place] [address]   Sodium 09/05/2021 139  135 - 145 mmol/L Final   Potassium 09/05/2021 3.7  3.5 - 5.1 mmol/L Final   Chloride 09/05/2021 107  98 - 111 mmol/L Final   CO2 09/05/2021 24  22 - 32 mmol/L Final   Glucose, Bld 09/05/2021 100 (H)  70 - 99 mg/dL Final   Glucose reference range applies only to samples taken after fasting for at least 8 hours.   BUN 09/05/2021 21  8 - 23 mg/dL Final   Creatinine, Ser 09/05/2021 1.08  0.61 - 1.24 mg/dL Final   Calcium 09/05/2021 9.5  8.9 - 10.3 mg/dL Final   GFR, Estimated 09/05/2021 >60  >60 mL/min Final   Comment: (NOTE) Calculated using the CKD-EPI Creatinine Equation (2021)    Anion gap 09/05/2021 8  5 - 15 Final   Performed at [facility], [address] [place] [address]   Prothrombin Time 09/05/2021 12.9  11.4 - 15.2 seconds  Final   INR 09/05/2021 1.0  0.8 - 1.2 Final   Comment: (NOTE) INR goal varies based on device and disease states. Performed at [facility], [place], [place] [address]    Color, Urine 09/05/2021 YELLOW (A)  YELLOW Final   APPearance 09/05/2021 HAZY (A)  CLEAR Final   Specific Gravity, Urine 09/05/2021 1.012  1.005 - 1.030 Final   pH 09/05/2021 6.0  5.0 - 8.0 Final   Glucose, UA 09/05/2021 NEGATIVE  NEGATIVE mg/dL Final   Hgb urine dipstick 09/05/2021 NEGATIVE  NEGATIVE Final   Bilirubin Urine 09/05/2021 NEGATIVE  NEGATIVE Final   Ketones, ur 09/05/2021 NEGATIVE  NEGATIVE mg/dL Final   Protein, ur 09/05/2021 NEGATIVE  NEGATIVE mg/dL Final   Nitrite 09/05/2021 NEGATIVE  NEGATIVE Final   Leukocytes,Ua 09/05/2021 SMALL (A)  NEGATIVE Final   RBC / HPF 09/05/2021 0-5  0 - 5 RBC/hpf Final   WBC, UA 09/05/2021 0-5  0 - 5 WBC/hpf Final   Bacteria, UA 09/05/2021 RARE (A)  NONE SEEN Final   Squamous Epithelial / LPF 09/05/2021 0-5  0 - 5 Final   Mucus 09/05/2021 PRESENT   Final   Performed at [facility], [address] [place], [place] [address]     ECG: Date: 03/26/2021 Rate: 63 bpm Rhythm:  SR with marked SA; IRBBB Intervals: PR 162 ms. QRS 110 ms. QTc 444 ms. ST segment and T wave changes: No evidence of acute ST segment elevation or depression. Evidence of age undetermined inferior and anterior infarcts present.  Comparison: Similar to previous tracing obtained on 05/05/2018  IMAGING / PROCEDURES: NM PET IMAGE RESTAGE (PS) SKULL BASE TO THIGH performed on 07/31/2021 There is no significant FDG uptake associated with the right lower lobe lung nodule. Given the small size of this nodule (8 mm) continued surveillance is recommended to confirm stability of this nodule as certain low-grade pulmonary neoplasms as well as renal cell carcinomas may exhibit low level FDG uptake on PET-CT. Left kidney mass exhibits mild to moderate increased uptake within  SUV max of 4.12. No signs of tracer avid nodal metastasis or today we will count 20 scheduled solid organ metastasis within the abdomen or pelvis  CT CHEST WITH CONTRAST performed on 06/13/2021 Solitary RIGHT lower lobe pulmonary nodule. Indeterminate finding in patient with LEFT renal  mass. LEFT renal mass not imaged. Post CABG anatomy.  CT HEMATURIA WORKUP performed on 05/27/2021 4.7 cm enhancing mass of the left kidney lower pole with morphologic characteristics most compatible with renal cell carcinoma. No adenopathy or tumor thrombus in the left renal vein. 9 x 6 x 8 mm solid right lower lobe nodule. Fleischner criteria do not apply given the presence of the left renal mass. Completion imaging of the chest may be warranted in the context of staging workup. Surveillance of the dominant right lower lobe nodule is suggested. Bilateral single nonobstructive renal calculi. Tiny bladder calculi are noted. Prostatomegaly. Small type 1 hiatal hernia.  Mild cardiomegaly.  Cholelithiasis.  Prominent stool throughout the colon favors constipation.  Aortic atherosclerosis Lower lumbar spondylosis and degenerative disc disease.  Small umbilical hernia contains adipose tissue.  TRANSTHORACIC ECHOCARDIOGRAM performed on 03/24/2018 Left ventricle: The cavity size was normal. There was mild concentric hypertrophy. Systolic function was normal. The estimated ejection fraction was in the range of 55% to 60%. Wall motion was normal; there were no regional wall motion  abnormalities. Left ventricular diastolic function parameters were normal.  Mitral valve: There was mild regurgitation. Valve area by pressure half-time: 1.98 cm^2.  Left atrium: The atrium was moderately dilated.  Right ventricle: The cavity size was normal. Wall thickness was normal. Systolic function was normal.  Pulmonary arteries: Systolic pressure was mildly increased. PA peak pressure: 37 mm Hg (S).  Pericardium, extracardiac: A mild  pericardial effusion was identified lateral to the left ventricle. Features were not consistent with tamponade physiology.  CORONARY ARTERY BYPASS GRAFTING PROCEDURE performed on 03/22/2018 3 vessel CABG procedure LIMA-LAD SVG-OM SVG-RI  LEFT HEART CATHETERIZATION AND CORONARY ANGIOGRAPHY performed on 03/15/2018 EF 50% Inferior hypokinesis Multi-vessel CAD 80% proximal LAD 99% OM1 100% proximal RCA Attempted PCI and recommendations Unsuccessful PCI and stent of OM1 failure across the lesion with wire Consider transfer the patient to tertiary care center for possible attempted intervention Patient vessel went from TIMI I to TIMI 0 flow Patient remained hemodynamically stable Anticoagulation with Angiomax was discontinued he has been loaded with aspirin and Plavix    Impression and Plan:  Roe Wilner has been referred for pre-anesthesia review and clearance prior to him undergoing the planned anesthetic and procedural courses. Available labs, pertinent testing, and imaging results were personally reviewed by me. This patient has been appropriately cleared by cardiology with an overall LOW risk of significant perioperative cardiovascular complications.  Based on clinical review performed today (09/05/21), barring any significant acute changes in the patient's overall condition, it is anticipated that he will be able to proceed with the planned surgical intervention. Any acute changes in clinical condition may necessitate his procedure being postponed and/or cancelled. Patient will meet with anesthesia team (MD and/or CRNA) on the day of his procedure for preoperative evaluation/assessment. Questions regarding anesthetic course will be fielded at that time.   Pre-surgical instructions were reviewed with the patient during his PAT appointment and questions were fielded by PAT clinical staff. Patient was advised that if any questions or concerns arise prior to his procedure then he should  return a call to PAT and/or his surgeon's office to discuss.  Honor Loh, MSN, APRN, FNP-C, CEN [facility]  Peri-operative Services Nurse Practitioner Phone: [phone] Fax: [phone] 09/05/21 3:59 PM  NOTE: This note has been prepared using Dragon dictation software. Despite my best ability to proofread, there is always the potential that unintentional transcriptional errors may still occur from this  process.

## 2021-09-05 NOTE — Patient Instructions (Addendum)
Your procedure is scheduled on:09-08-21 Monday ?Report to the Registration Desk on the 1st floor of the [facility].Then proceed to the 2nd floor Surgery Desk in the [facility] at 7:30 AM ? ?REMEMBER: ?Instructions that are not followed completely may result in serious medical risk, up to and including death; or upon the discretion of your surgeon and anesthesiologist your surgery may need to be rescheduled. ? ?Do not eat food OR drink any liquids after midnight the night before surgery.  ?No gum chewing, lozengers or hard candies. ? ?TAKE THESE MEDICATIONS THE MORNING OF SURGERY WITH A SIP OF WATER: ?-finasteride (PROSCAR) ?-Vibegron [facility])  ? ?Last dose of Aspirin was on either 09-02-21 or 09-03-21 per patient-You will be instructed on when to resume your Aspirin after your surgery ? ?One week prior to surgery: ?Stop Anti-inflammatories (NSAIDS) such as Advil, Aleve, Ibuprofen, Motrin, Naproxen, Naprosyn and Aspirin based products such as Excedrin, Goodys Powder, BC Powder.You may however, continue to take Tylenol if needed for pain up until the day of surgery. ? ?Stop ANY OVER THE COUNTER supplements/vitamins NOW (09-05-21) until after surgery (Multivitamin and Fish Oil) ? ?No Alcohol for 24 hours before or after surgery. ? ?No Smoking including e-cigarettes for 24 hours prior to surgery.  ?No chewable tobacco products for at least 6 hours prior to surgery.  ?No nicotine patches on the day of surgery. ? ?Do not use any "recreational" drugs for at least a week prior to your surgery.  ?Please be advised that the combination of cocaine and anesthesia may have negative outcomes, up to and including death. ?If you test positive for cocaine, your surgery will be cancelled. ? ?On the morning of surgery brush your teeth with toothpaste and water, you may rinse your mouth with mouthwash if you wish. ?Do not swallow any toothpaste or mouthwash. ? ?Do not wear jewelry, make-up, hairpins, clips or nail polish. ? ?Do  not wear lotions, powders, or perfumes.  ? ?Do not shave body from the neck down 48 hours prior to surgery just in case you cut yourself which could leave a site for infection.  ?Also, freshly shaved skin may become irritated if using the CHG soap. ? ?Contact lenses, hearing aids and dentures may not be worn into surgery. ? ?Do not bring valuables to the hospital. [facility] is not responsible for any missing/lost belongings or valuables.  ? ?Notify your doctor if there is any change in your medical condition (cold, fever, infection). ? ?Wear comfortable clothing (specific to your surgery type) to the hospital. ? ?After surgery, you can help prevent lung complications by doing breathing exercises.  ?Take deep breaths and cough every 1-2 hours. Your doctor may order a device called an Incentive Spirometer to help you take deep breaths. ?When coughing or sneezing, hold a pillow firmly against your incision with both hands. This is called ?splinting.? Doing this helps protect your incision. It also decreases belly discomfort. ? ?If you are being admitted to the hospital overnight, leave your suitcase in the car. ?After surgery it may be brought to your room. ? ?If you are being discharged the day of surgery, you will not be allowed to drive home. ?You will need a responsible adult (18 years or older) to drive you home and stay with you that night.  ? ?If you are taking public transportation, you will need to have a responsible adult (18 years or older) with you. ?Please confirm with your physician that it is acceptable to use public  transportation.  ? ?Please call the [facility] Dept. at [phone] if you have any questions about these instructions. ? ?Surgery Visitation Policy: ? ?Patients undergoing a surgery or procedure may have one family member or support person with them as long as that person is not COVID-19 positive or experiencing its symptoms.  ?That person may remain in the waiting area  during the procedure and may rotate out with other people. ? ?Inpatient Visitation:   ? ?Visiting hours are 7 a.m. to 8 p.m. ?Up to two visitors ages 16+ are allowed at one time in a patient room. The visitors may rotate out with other people during the day. Visitors must check out when they leave, or other visitors will not be allowed. One designated support person may remain overnight. ?The visitor must pass COVID-19 screenings, use hand sanitizer when entering and exiting the patient?s room and wear a mask at all times, including in the patient?s room. ?Patients must also wear a mask when staff or their visitor are in the room. ?Masking is required regardless of vaccination status.  ?

## 2021-09-05 NOTE — Addendum Note (Signed)
Encounter addended by: Melanee Left, RN on: 09/05/2021 12:51 PM  Actions taken: Order Reconciliation Section accessed

## 2021-09-06 LAB — URINE CULTURE: Culture: NO GROWTH

## 2021-09-08 ENCOUNTER — Inpatient Hospital Stay
Admission: RE | Admit: 2021-09-08 | Discharge: 2021-09-09 | DRG: 657 | Disposition: A | Discharge status: Home / Self Care | Payer: Medicare Other | Attending: Urology | Admitting: Urology

## 2021-09-08 ENCOUNTER — Inpatient Hospital Stay: Payer: Medicare Other | Admitting: Urgent Care

## 2021-09-08 ENCOUNTER — Other Ambulatory Visit: Payer: Self-pay

## 2021-09-08 ENCOUNTER — Encounter: Payer: Self-pay | Admitting: Urology

## 2021-09-08 ENCOUNTER — Encounter
Admission: RE | Disposition: A | Discharge status: Home / Self Care | Payer: Self-pay | Source: Home / Self Care | Attending: Urology

## 2021-09-08 DIAGNOSIS — R911 Solitary pulmonary nodule: Secondary | ICD-10-CM | POA: Diagnosis present

## 2021-09-08 DIAGNOSIS — Z8249 Family history of ischemic heart disease and other diseases of the circulatory system: Secondary | ICD-10-CM

## 2021-09-08 DIAGNOSIS — Z87891 Personal history of nicotine dependence: Secondary | ICD-10-CM | POA: Diagnosis not present

## 2021-09-08 DIAGNOSIS — K219 Gastro-esophageal reflux disease without esophagitis: Secondary | ICD-10-CM | POA: Diagnosis present

## 2021-09-08 DIAGNOSIS — C642 Malignant neoplasm of left kidney, except renal pelvis: Principal | ICD-10-CM | POA: Diagnosis present

## 2021-09-08 DIAGNOSIS — I4819 Other persistent atrial fibrillation: Secondary | ICD-10-CM | POA: Diagnosis present

## 2021-09-08 DIAGNOSIS — I10 Essential (primary) hypertension: Secondary | ICD-10-CM | POA: Diagnosis present

## 2021-09-08 DIAGNOSIS — E785 Hyperlipidemia, unspecified: Secondary | ICD-10-CM | POA: Diagnosis present

## 2021-09-08 DIAGNOSIS — Z86010 Personal history of colonic polyps: Secondary | ICD-10-CM | POA: Diagnosis not present

## 2021-09-08 DIAGNOSIS — K429 Umbilical hernia without obstruction or gangrene: Secondary | ICD-10-CM | POA: Diagnosis present

## 2021-09-08 DIAGNOSIS — Z87442 Personal history of urinary calculi: Secondary | ICD-10-CM

## 2021-09-08 DIAGNOSIS — N401 Enlarged prostate with lower urinary tract symptoms: Secondary | ICD-10-CM | POA: Diagnosis present

## 2021-09-08 DIAGNOSIS — N2889 Other specified disorders of kidney and ureter: Secondary | ICD-10-CM | POA: Diagnosis present

## 2021-09-08 DIAGNOSIS — I252 Old myocardial infarction: Secondary | ICD-10-CM

## 2021-09-08 DIAGNOSIS — Z951 Presence of aortocoronary bypass graft: Secondary | ICD-10-CM | POA: Diagnosis not present

## 2021-09-08 DIAGNOSIS — F32A Depression, unspecified: Secondary | ICD-10-CM | POA: Diagnosis present

## 2021-09-08 DIAGNOSIS — Z20822 Contact with and (suspected) exposure to covid-19: Secondary | ICD-10-CM | POA: Diagnosis present

## 2021-09-08 DIAGNOSIS — R3914 Feeling of incomplete bladder emptying: Secondary | ICD-10-CM | POA: Diagnosis present

## 2021-09-08 DIAGNOSIS — Z8551 Personal history of malignant neoplasm of bladder: Secondary | ICD-10-CM | POA: Diagnosis not present

## 2021-09-08 DIAGNOSIS — I2510 Atherosclerotic heart disease of native coronary artery without angina pectoris: Secondary | ICD-10-CM | POA: Diagnosis present

## 2021-09-08 HISTORY — PX: LAPAROSCOPIC NEPHRECTOMY, HAND ASSISTED: SHX1929

## 2021-09-08 SURGERY — NEPHRECTOMY, HAND-ASSISTED, LAPAROSCOPIC
Anesthesia: General | Laterality: Left

## 2021-09-08 MED ORDER — ACETAMINOPHEN 10 MG/ML IV SOLN: 1000.0000 mg | Freq: Once | INTRAVENOUS | Status: DC | PRN

## 2021-09-08 MED ORDER — CEFAZOLIN SODIUM-DEXTROSE 1-4 GM/50ML-% IV SOLN
1.0000 g | Freq: Three times a day (TID) | INTRAVENOUS | Status: AC
  Administered 2021-09-08 – 2021-09-09 (×2): 1 g via INTRAVENOUS
  Filled 2021-09-08 (×2): qty 50

## 2021-09-08 MED ORDER — HYDROMORPHONE HCL 1 MG/ML IJ SOLN
0.5000 mg | INTRAMUSCULAR | Status: DC | PRN
  Administered 2021-09-08: 0.5 mg via INTRAVENOUS

## 2021-09-08 MED ORDER — ROCURONIUM BROMIDE 100 MG/10ML IV SOLN
INTRAVENOUS | Status: DC | PRN
  Administered 2021-09-08: 50 mg via INTRAVENOUS
  Administered 2021-09-08: 30 mg via INTRAVENOUS
  Administered 2021-09-08: 20 mg via INTRAVENOUS

## 2021-09-08 MED ORDER — VIBEGRON 75 MG PO TABS
75.0000 mg | ORAL_TABLET | Freq: Every day | ORAL | Status: DC
Start: 2021-09-08 — End: 2021-09-09

## 2021-09-08 MED ORDER — ACETAMINOPHEN 325 MG PO TABS
650.0000 mg | ORAL_TABLET | ORAL | Status: DC | PRN
  Administered 2021-09-08 – 2021-09-09 (×3): 650 mg via ORAL
  Filled 2021-09-08 (×3): qty 2

## 2021-09-08 MED ORDER — LIDOCAINE HCL (PF) 2 % IJ SOLN
INTRAMUSCULAR | Status: AC
  Filled 2021-09-08: qty 5

## 2021-09-08 MED ORDER — LIDOCAINE HCL (CARDIAC) PF 100 MG/5ML IV SOSY
PREFILLED_SYRINGE | INTRAVENOUS | Status: DC | PRN
  Administered 2021-09-08: 100 mg via INTRAVENOUS

## 2021-09-08 MED ORDER — ONDANSETRON HCL 4 MG/2ML IJ SOLN: 4.0000 mg | Freq: Once | INTRAMUSCULAR | Status: DC | PRN

## 2021-09-08 MED ORDER — FAMOTIDINE 20 MG PO TABS
ORAL_TABLET | ORAL | Status: AC
  Administered 2021-09-08: 20 mg via ORAL
  Filled 2021-09-08: qty 1

## 2021-09-08 MED ORDER — ROCURONIUM BROMIDE 10 MG/ML (PF) SYRINGE
PREFILLED_SYRINGE | INTRAVENOUS | Status: AC
  Filled 2021-09-08: qty 10

## 2021-09-08 MED ORDER — OXYCODONE HCL 5 MG PO TABS
5.0000 mg | ORAL_TABLET | Freq: Once | ORAL | Status: AC | PRN
  Administered 2021-09-08: 5 mg via ORAL

## 2021-09-08 MED ORDER — ORAL CARE MOUTH RINSE: 15.0000 mL | Freq: Once | OROMUCOSAL | Status: AC

## 2021-09-08 MED ORDER — DIPHENHYDRAMINE HCL 12.5 MG/5ML PO ELIX
12.5000 mg | ORAL_SOLUTION | Freq: Four times a day (QID) | ORAL | Status: DC | PRN
  Filled 2021-09-08: qty 5

## 2021-09-08 MED ORDER — FENTANYL CITRATE (PF) 100 MCG/2ML IJ SOLN
INTRAMUSCULAR | Status: AC
  Administered 2021-09-08: 50 ug via INTRAVENOUS
  Filled 2021-09-08: qty 2

## 2021-09-08 MED ORDER — LACTATED RINGERS IV SOLN: INTRAVENOUS | Status: DC

## 2021-09-08 MED ORDER — CEFAZOLIN SODIUM-DEXTROSE 2-4 GM/100ML-% IV SOLN
2.0000 g | INTRAVENOUS | Status: AC
  Administered 2021-09-08: 2 g via INTRAVENOUS

## 2021-09-08 MED ORDER — CHLORHEXIDINE GLUCONATE 0.12 % MT SOLN
OROMUCOSAL | Status: AC
  Administered 2021-09-08: 15 mL via OROMUCOSAL
  Filled 2021-09-08: qty 15

## 2021-09-08 MED ORDER — THROMBIN 5000 UNITS EX SOLR
CUTANEOUS | Status: AC
  Filled 2021-09-08: qty 5000

## 2021-09-08 MED ORDER — ACETAMINOPHEN 10 MG/ML IV SOLN
INTRAVENOUS | Status: DC | PRN
  Administered 2021-09-08: 1000 mg via INTRAVENOUS

## 2021-09-08 MED ORDER — DOCUSATE SODIUM 100 MG PO CAPS
100.0000 mg | ORAL_CAPSULE | Freq: Two times a day (BID) | ORAL | Status: DC
  Administered 2021-09-08 – 2021-09-09 (×3): 100 mg via ORAL
  Filled 2021-09-08 (×3): qty 1

## 2021-09-08 MED ORDER — BUPIVACAINE LIPOSOME 1.3 % IJ SUSP
INTRAMUSCULAR | Status: AC
  Filled 2021-09-08: qty 20

## 2021-09-08 MED ORDER — CEFAZOLIN SODIUM-DEXTROSE 2-4 GM/100ML-% IV SOLN
INTRAVENOUS | Status: AC
  Filled 2021-09-08: qty 100

## 2021-09-08 MED ORDER — OXYCODONE-ACETAMINOPHEN 5-325 MG PO TABS
1.0000 | ORAL_TABLET | ORAL | Status: DC | PRN
  Administered 2021-09-09: 2 via ORAL
  Filled 2021-09-08: qty 2

## 2021-09-08 MED ORDER — CHLORHEXIDINE GLUCONATE 0.12 % MT SOLN
15.0000 mL | Freq: Once | OROMUCOSAL | Status: AC
Start: 2021-09-08 — End: 2021-09-08

## 2021-09-08 MED ORDER — ATORVASTATIN CALCIUM 20 MG PO TABS
80.0000 mg | ORAL_TABLET | Freq: Every day | ORAL | Status: DC
  Administered 2021-09-08 – 2021-09-09 (×2): 80 mg via ORAL
  Filled 2021-09-08 (×2): qty 4

## 2021-09-08 MED ORDER — FENTANYL CITRATE (PF) 100 MCG/2ML IJ SOLN
INTRAMUSCULAR | Status: DC | PRN
  Administered 2021-09-08 (×2): 50 ug via INTRAVENOUS

## 2021-09-08 MED ORDER — OXYBUTYNIN CHLORIDE 5 MG PO TABS: 5.0000 mg | ORAL_TABLET | Freq: Three times a day (TID) | ORAL | Status: DC | PRN

## 2021-09-08 MED ORDER — DEXAMETHASONE SODIUM PHOSPHATE 10 MG/ML IJ SOLN
INTRAMUSCULAR | Status: DC | PRN
  Administered 2021-09-08: 10 mg via INTRAVENOUS

## 2021-09-08 MED ORDER — STERILE WATER FOR IRRIGATION IR SOLN
Status: DC | PRN
  Administered 2021-09-08: 100 mL

## 2021-09-08 MED ORDER — DIPHENHYDRAMINE HCL 50 MG/ML IJ SOLN: 12.5000 mg | Freq: Four times a day (QID) | INTRAMUSCULAR | Status: DC | PRN

## 2021-09-08 MED ORDER — ACETAMINOPHEN 10 MG/ML IV SOLN
INTRAVENOUS | Status: AC
  Filled 2021-09-08: qty 100

## 2021-09-08 MED ORDER — FAMOTIDINE 20 MG PO TABS: 20.0000 mg | ORAL_TABLET | Freq: Once | ORAL | Status: AC

## 2021-09-08 MED ORDER — BUPIVACAINE LIPOSOME 1.3 % IJ SUSP
INTRAMUSCULAR | Status: DC | PRN
  Administered 2021-09-08: 50 mL

## 2021-09-08 MED ORDER — PROPOFOL 10 MG/ML IV BOLUS
INTRAVENOUS | Status: DC | PRN
  Administered 2021-09-08: 50 mg via INTRAVENOUS
  Administered 2021-09-08: 150 mg via INTRAVENOUS

## 2021-09-08 MED ORDER — OXYCODONE HCL 5 MG/5ML PO SOLN: 5.0000 mg | Freq: Once | ORAL | Status: AC | PRN

## 2021-09-08 MED ORDER — HYDROMORPHONE HCL 1 MG/ML IJ SOLN
INTRAMUSCULAR | Status: AC
  Administered 2021-09-08: 0.5 mg via INTRAVENOUS
  Filled 2021-09-08: qty 1

## 2021-09-08 MED ORDER — FENTANYL CITRATE (PF) 100 MCG/2ML IJ SOLN
INTRAMUSCULAR | Status: AC
Start: 2021-09-08 — End: ?
  Filled 2021-09-08: qty 2

## 2021-09-08 MED ORDER — HEPARIN SODIUM (PORCINE) 5000 UNIT/ML IJ SOLN
5000.0000 [IU] | Freq: Three times a day (TID) | INTRAMUSCULAR | Status: DC
  Administered 2021-09-08 – 2021-09-09 (×3): 5000 [IU] via SUBCUTANEOUS
  Filled 2021-09-08 (×4): qty 1

## 2021-09-08 MED ORDER — DEXAMETHASONE SODIUM PHOSPHATE 10 MG/ML IJ SOLN
INTRAMUSCULAR | Status: AC
  Filled 2021-09-08: qty 1

## 2021-09-08 MED ORDER — TRAZODONE HCL 50 MG PO TABS
50.0000 mg | ORAL_TABLET | Freq: Every day | ORAL | Status: DC
  Administered 2021-09-08: 50 mg via ORAL
  Filled 2021-09-08: qty 1

## 2021-09-08 MED ORDER — FENTANYL CITRATE (PF) 100 MCG/2ML IJ SOLN
25.0000 ug | INTRAMUSCULAR | Status: DC | PRN
  Administered 2021-09-08: 50 ug via INTRAVENOUS

## 2021-09-08 MED ORDER — PHENYLEPHRINE HCL (PRESSORS) 10 MG/ML IV SOLN
INTRAVENOUS | Status: DC | PRN
  Administered 2021-09-08 (×2): 80 ug via INTRAVENOUS
  Administered 2021-09-08 (×3): 160 ug via INTRAVENOUS

## 2021-09-08 MED ORDER — TAMSULOSIN HCL 0.4 MG PO CAPS
0.4000 mg | ORAL_CAPSULE | Freq: Every day | ORAL | Status: DC
  Administered 2021-09-08 – 2021-09-09 (×2): 0.4 mg via ORAL
  Filled 2021-09-08 (×2): qty 1

## 2021-09-08 MED ORDER — FINASTERIDE 5 MG PO TABS
5.0000 mg | ORAL_TABLET | Freq: Every day | ORAL | Status: DC
  Administered 2021-09-08 – 2021-09-09 (×2): 5 mg via ORAL
  Filled 2021-09-08 (×2): qty 1

## 2021-09-08 MED ORDER — ONDANSETRON HCL 4 MG/2ML IJ SOLN: 4.0000 mg | INTRAMUSCULAR | Status: DC | PRN

## 2021-09-08 MED ORDER — ONDANSETRON HCL 4 MG/2ML IJ SOLN
INTRAMUSCULAR | Status: AC
  Filled 2021-09-08: qty 2

## 2021-09-08 MED ORDER — BUPIVACAINE-EPINEPHRINE (PF) 0.5% -1:200000 IJ SOLN
INTRAMUSCULAR | Status: AC
  Filled 2021-09-08: qty 30

## 2021-09-08 MED ORDER — ASPIRIN EC 81 MG PO TBEC
81.0000 mg | DELAYED_RELEASE_TABLET | Freq: Every day | ORAL | Status: DC
  Administered 2021-09-08 – 2021-09-09 (×2): 81 mg via ORAL
  Filled 2021-09-08 (×2): qty 1

## 2021-09-08 MED ORDER — SUGAMMADEX SODIUM 200 MG/2ML IV SOLN
INTRAVENOUS | Status: DC | PRN
  Administered 2021-09-08: 200 mg via INTRAVENOUS

## 2021-09-08 MED ORDER — SURGIFLO WITH THROMBIN (HEMOSTATIC MATRIX KIT) OPTIME
TOPICAL | Status: DC | PRN
Start: 2021-09-08 — End: 2021-09-08
  Administered 2021-09-08: 1 via TOPICAL

## 2021-09-08 MED ORDER — MORPHINE SULFATE (PF) 2 MG/ML IV SOLN: 2.0000 mg | INTRAVENOUS | Status: DC | PRN

## 2021-09-08 MED ORDER — ONDANSETRON HCL 4 MG/2ML IJ SOLN
INTRAMUSCULAR | Status: DC | PRN
  Administered 2021-09-08: 4 mg via INTRAVENOUS

## 2021-09-08 MED ORDER — SODIUM CHLORIDE 0.9 % IV SOLN: INTRAVENOUS | Status: DC

## 2021-09-08 MED ORDER — OXYCODONE HCL 5 MG PO TABS
ORAL_TABLET | ORAL | Status: AC
  Filled 2021-09-08: qty 1

## 2021-09-08 SURGICAL SUPPLY — 86 items
ANCHOR TIS RET SYS 1550ML (BAG) ×1 IMPLANT
APPLICATOR SURGIFLO ENDO (HEMOSTASIS) IMPLANT
APPLIER CLIP ROT 10 11.4 M/L (STAPLE)
APPLIER CLIP ROT 13.4 12 LRG (CLIP)
BAG LAPAROSCOPIC 12 15 PORT 16 (BASKET) ×1 IMPLANT
BAG RETRIEVAL 12/15 (BASKET)
CHLORAPREP W/TINT 26 (MISCELLANEOUS) ×1 IMPLANT
CLEANER CAUTERY TIP 5X5 PAD (MISCELLANEOUS) ×1 IMPLANT
CLIP APPLIE ROT 10 11.4 M/L (STAPLE) IMPLANT
CLIP APPLIE ROT 13.4 12 LRG (CLIP) IMPLANT
CLIP LIGATING HEM O LOK PURPLE (MISCELLANEOUS) ×2 IMPLANT
CUTTER ECHEON FLEX ENDO 45 340 (ENDOMECHANICALS) IMPLANT
DEFOGGER SCOPE WARMER CLEARIFY (MISCELLANEOUS) ×2 IMPLANT
DERMABOND ADVANCED (GAUZE/BANDAGES/DRESSINGS) ×2
DERMABOND ADVANCED .7 DNX12 (GAUZE/BANDAGES/DRESSINGS) ×2 IMPLANT
DRAPE INCISE IOBAN 66X45 STRL (DRAPES) ×2 IMPLANT
DRAPE STERI POUCH LG 24X46 STR (DRAPES) ×1 IMPLANT
DRAPE SURG 17X11 SM STRL (DRAPES) ×4 IMPLANT
DRSG TEGADERM 2-3/8X2-3/4 SM (GAUZE/BANDAGES/DRESSINGS) IMPLANT
DRSG TEGADERM 4X4.75 (GAUZE/BANDAGES/DRESSINGS) IMPLANT
DRSG TELFA 3X8 NADH (GAUZE/BANDAGES/DRESSINGS) IMPLANT
ELECT REM PT RETURN 9FT ADLT (ELECTROSURGICAL) ×2
ELECTRODE REM PT RTRN 9FT ADLT (ELECTROSURGICAL) ×1 IMPLANT
GLOVE SURG ENC MOIS LTX SZ6.5 (GLOVE) ×6 IMPLANT
GLOVE SURG UNDER LTX SZ6.5 (GLOVE) ×3 IMPLANT
GOWN STRL REUS W/ TWL LRG LVL3 (GOWN DISPOSABLE) ×3 IMPLANT
GOWN STRL REUS W/TWL LRG LVL3 (GOWN DISPOSABLE) ×3
GRASPER SUT TROCAR 14GX15 (MISCELLANEOUS) ×2 IMPLANT
HANDLE YANKAUER SUCT BULB TIP (MISCELLANEOUS) ×2 IMPLANT
HEMOSTAT SURGICEL 2X14 (HEMOSTASIS) ×1 IMPLANT
HOLDER FOLEY CATH W/STRAP (MISCELLANEOUS) ×2 IMPLANT
IRRIGATION STRYKERFLOW (MISCELLANEOUS) ×1 IMPLANT
IRRIGATOR STRYKERFLOW (MISCELLANEOUS) ×2
KIT PINK PAD W/HEAD ARE REST (MISCELLANEOUS) ×2
KIT PINK PAD W/HEAD ARM REST (MISCELLANEOUS) ×1 IMPLANT
KIT TURNOVER KIT A (KITS) ×2 IMPLANT
KITTNER LAPARASCOPIC 5X40 (MISCELLANEOUS) ×1 IMPLANT
L-HOOK LAP DISP 36CM (ELECTROSURGICAL) ×2
LABEL OR SOLS (LABEL) ×2 IMPLANT
LHOOK LAP DISP 36CM (ELECTROSURGICAL) ×1 IMPLANT
LIGASURE LAP ATLAS 10MM 37CM (INSTRUMENTS) ×2 IMPLANT
LOOP RED MAXI  1X406MM (MISCELLANEOUS) ×1
LOOP VESSEL MAXI 1X406 RED (MISCELLANEOUS) ×1 IMPLANT
MANIFOLD NEPTUNE II (INSTRUMENTS) ×2 IMPLANT
NDL HYPO 21X1.5 SAFETY (NEEDLE) ×1 IMPLANT
NEEDLE HYPO 21X1.5 SAFETY (NEEDLE) IMPLANT
PACK LAP CHOLECYSTECTOMY (MISCELLANEOUS) ×2 IMPLANT
PAD CLEANER CAUTERY TIP 5X5 (MISCELLANEOUS)
PAD DRESSING TELFA 3X8 NADH (GAUZE/BANDAGES/DRESSINGS) IMPLANT
PENCIL ELECTRO HAND CTR (MISCELLANEOUS) ×2 IMPLANT
RELOAD STAPLE 35X2.5 WHT THIN (STAPLE) IMPLANT
RELOAD STAPLE 45 2.6 WHT THIN (STAPLE) IMPLANT
RELOAD STAPLE 60 2.6 WHT THN (STAPLE) IMPLANT
RELOAD STAPLER WHITE 60MM (STAPLE) ×3 IMPLANT
SCISSORS METZENBAUM CVD 33 (INSTRUMENTS) ×2 IMPLANT
SET TUBE SMOKE EVAC HIGH FLOW (TUBING) ×2 IMPLANT
SPONGE T-LAP 18X18 ~~LOC~~+RFID (SPONGE) ×2 IMPLANT
STAPLE ECHEON FLEX 60 POW ENDO (STAPLE) ×1 IMPLANT
STAPLE RELOAD 2.5MM WHITE (STAPLE) IMPLANT
STAPLE RELOAD 45 WHT (STAPLE) IMPLANT
STAPLE RELOAD 45MM WHITE (STAPLE)
STAPLER RELOAD WHITE 60MM (STAPLE) ×6
STAPLER SKIN PROX 35W (STAPLE) ×2 IMPLANT
STAPLER VASCULAR ECHELON 35 (CUTTER) IMPLANT
STRIP CLOSURE SKIN 1/2X4 (GAUZE/BANDAGES/DRESSINGS) IMPLANT
SURGIFLO W/THROMBIN 8M KIT (HEMOSTASIS) ×1 IMPLANT
SUT CHROMIC 0 CT 1 (SUTURE) IMPLANT
SUT MNCRL AB 4-0 PS2 18 (SUTURE) ×5 IMPLANT
SUT PDS AB 1 CT1 36 (SUTURE) ×1 IMPLANT
SUT PDS AB 1 TP1 54 (SUTURE) ×4 IMPLANT
SUT VIC AB 0 CT1 36 (SUTURE) ×4 IMPLANT
SUT VIC AB 1 CT1 36 (SUTURE) IMPLANT
SUT VIC AB 2-0 SH 27 (SUTURE)
SUT VIC AB 2-0 SH 27XBRD (SUTURE) IMPLANT
SUT VIC AB 2-0 UR6 27 (SUTURE) IMPLANT
SUT VIC AB 4-0 FS2 27 (SUTURE) ×2 IMPLANT
SUT VICRYL 0 AB UR-6 (SUTURE) ×2 IMPLANT
SYR 30ML LL (SYRINGE) ×1 IMPLANT
SYS LAPSCP GELPORT 120MM (MISCELLANEOUS) ×2
SYSTEM LAPSCP GELPORT 120MM (MISCELLANEOUS) ×1 IMPLANT
TRAY FOLEY MTR SLVR 16FR STAT (SET/KITS/TRAYS/PACK) ×2 IMPLANT
TROCAR ENDOPATH XCEL 12X100 BL (ENDOMECHANICALS) ×2 IMPLANT
TROCAR XCEL 12X100 BLDLESS (ENDOMECHANICALS) ×2 IMPLANT
TROCAR XCEL NON-BLD 5MMX100MML (ENDOMECHANICALS) IMPLANT
WATER STERILE IRR 3000ML UROMA (IV SOLUTION) ×2 IMPLANT
WATER STERILE IRR 500ML POUR (IV SOLUTION) ×2 IMPLANT

## 2021-09-08 NOTE — H&P (Signed)
09/08/21 RRR [place] [birth year] [id number]   Referring provider:  Juline Patch, MD [address] [place],  [place] [address]     Chief Complaint  Patient presents with   Follow-up      Discuss results      HPI: David Mccullough is a 76 y.o.male with a personal history of  renal mass,urinary urgency, incomplete bladder emptying, elevated PSA, microscopic hematuria, bladder cancer and pulmonary nodule who presents today for results.    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 No adenopathy or tumor thrombus in the left renal vein. 9 x 6 x 8 mm solid right lower lobe nodule. Fleischner criteria do not apply given the presence of the left renal mass. Bilateral single nonobstructive renal calculi. Tiny bladder calculi are noted and prostatomegaly.   He underwent a chest CT on 06/13/2021 that revealed solitary right lower lobe pulmonary nodule indeterminate finding in patient with left renal mass.    He is s/p renal biopsy on 06/26/2021. Surgical pathology revealed renal cell carcinoma, conventional clear-cell type, nuclear grade 2.   Cystoscopy on 06/05/2021 showed 1.5 cm relatively flat papillary tumor lateral to the left UO consisted with TCC.    He is s/p TURBT with instillation of intravesical chemotherapy on 07/28/2021.Intraoperative findings: Flat broad-based superficial appearing 1.5 cm papillary left posterior bladder wall tumor consistent with transitional cell carcinoma.  No other lesions in the bladder identified.  Mildly trabeculated bladder.   Surgical pathology of bladder specimen consistent with  non-invasive papillary urothelial carcinoma, low grade.     NM PET scan on 07/31/2021 visualized small (42m) nodule of right lower lobe lung nodule. Left kidney mass exhibits mild to moderate increased uptake within SUV  max of 4.12. No signs of tracer avid nodal metastasis or solid organ metastasis within the abdomen or pelvis.   No previous abdominal surgeries.   PMH:     Past Medical History:  Diagnosis Date   Aortic atherosclerosis ([place]     Bladder tumor 07/03/2021    a.) cystoscopy revealed 1.5 cm relatively flat papillary tumor lateral to UO; felt to be consistent with TCC   CAD (coronary artery disease) 03/15/2018    a.) NSTEMI 03/14/2018 --> LHC --> EF 50% with inferior HK; 80% pLAD, 99% OM1, 100% pRCA --> failed PCI (TIMI-1 reduced to TIMI-0); [facility] (LIMA-LAD, SVG-OM, SVG-RI) on 03/22/2018.   Colon polyps     Depression     Elevated PSA     Enlarged prostate     GERD (gastroesophageal reflux disease)     Hyperlipidemia     Hypertension     Nephrolithiasis     NSTEMI (non-ST elevated myocardial infarction) ([place] 03/14/2018    a.) LHC 03/15/2018 --> 80% pLAD, 99% OM1, 100% pRCA --> [facility]; underwent 3v CABG (LIMA-LAD, SVG-OM, SVG-RI) on 03/25/2018.   Persistent atrial fibrillation (HCC)      a.) CHA2DS2-VASc = 4 (age x2, HTN, prior MI). b.) rate/rhythm maintained without pharmacological intervention; no daily anticoagulation.   Renal mass, left 05/27/2021    a.) CT urogram --> 4.1 x 3.6 x 4.7 cm mass at LEFT kidney pole. b.) Bx (+) for RCC, conventional clear cell type, nuclear grade II.   Right lower lobe pulmonary nodule 05/27/2021  a.) CT chest --> measured 9 mm.   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    a.) 3v CABG: LIMA-LAD, SVG-OM, SVG-RI   SVT (supraventricular tachycardia) ([place]) 2009    a.) s/p SVT ablation while living in [place]      Surgical History:      Past Surgical History:  Procedure Laterality Date   COLONOSCOPY       CORONARY ARTERY BYPASS GRAFT N/A 03/22/2018    Procedure: CORONARY ARTERY BYPASS GRAFTING (CABG) x 3 WITH ENDOSCOPIC HARVESTING OF RIGHT GREATER SAPHENOUS VEIN;  Surgeon: Ivin Poot, MD;  Location: [place];  Service: Open Heart Surgery;  Laterality: N/A;   CORONARY STENT INTERVENTION N/A 03/16/2018    Procedure: CORONARY STENT INTERVENTION;  Surgeon: Yolonda Kida, MD;  Location: [facility];  Service: Cardiovascular;  Laterality: N/A;   LEFT ATRIAL APPENDAGE OCCLUSION N/A 03/22/2018    Procedure: LEFT ATRIAL APPENDAGE OCCLUSION USING 40 MM ATRICURE ATRICLIP;  Surgeon: Ivin Poot, MD;  Location: [place];  Service: Open Heart Surgery;  Laterality: N/A;   LEFT HEART CATH AND CORONARY ANGIOGRAPHY N/A 03/15/2018    Procedure: LEFT HEART CATH AND CORONARY ANGIOGRAPHY and PCI stent;  Surgeon: Yolonda Kida, MD;  Location: [facility];  Service: Cardiovascular;  Laterality: N/A;   SVT ABLATION N/A 2009    Procedure: SVT ABLATION; Location: performed while living in [place].   TEE WITHOUT CARDIOVERSION N/A 03/22/2018    Procedure: TRANSESOPHAGEAL ECHOCARDIOGRAM (TEE);  Surgeon: Prescott Gum, Collier Salina, MD;  Location: [facility];  Service: Open Heart Surgery;  Laterality: N/A;   TRANSURETHRAL RESECTION OF BLADDER TUMOR N/A 07/28/2021    Procedure: TRANSURETHRAL RESECTION OF BLADDER TUMOR (TURBT)WITH  GEMCITABINE;  Surgeon: Hollice Espy, MD;  Location: [facility];  Service: Urology;  Laterality: N/A;      Home Medications:  Allergies as of 08/08/2021   No Known Allergies         Medication List           Accurate as of August 08, 2021 11:59 PM. If you have any questions, ask your nurse or doctor.              STOP taking these medications     HYDROcodone-acetaminophen 5-325 MG tablet Commonly known as: NORCO/VICODIN Stopped by: Hollice Espy, MD           TAKE these medications     aspirin EC 81 MG tablet Take 81 mg by mouth daily. Swallow whole.    atorvastatin 80 MG tablet Commonly known as: LIPITOR TAKE 1 TABLET(80 MG) BY MOUTH DAILY AT 6 PM    calcium carbonate 500 MG chewable tablet Commonly known as: TUMS - dosed in  mg elemental calcium Chew 1 tablet by mouth as needed for indigestion or heartburn.    CALCIUM PO Take 1 tablet by mouth daily.    finasteride 5 MG tablet Commonly known as: PROSCAR Take 1 tablet (5 mg total) by mouth daily.    FISH OIL PO Take 1 tablet by mouth daily.    Gemtesa 75 MG Tabs Generic drug: Vibegron Take 75 mg by mouth daily.    multivitamin with minerals tablet Take 1 tablet by mouth daily.    oxybutynin 5 MG tablet Commonly known as: DITROPAN Take 1 tablet (5 mg total) by mouth every 8 (eight) hours as needed for bladder spasms.    tamsulosin 0.4 MG Caps capsule Commonly known as: FLOMAX Take 1 capsule (0.4 mg total)  by mouth daily.    traZODone 50 MG tablet Commonly known as: DESYREL TAKE 1 TABLET(50 MG) BY MOUTH AT BEDTIME             Allergies: No Known Allergies   Family History:      Family History  Problem Relation Age of Onset   Heart disease Mother     Heart disease Father     Arrhythmia Sister     Heart disease Sister        Social History:  reports that he quit smoking about 34 years ago. His smoking use included cigarettes. He has a 25.00 pack-year smoking history. He has never been exposed to tobacco smoke. He has never used smokeless tobacco. He reports that he does not currently use alcohol. He reports that he does not currently use drugs.     Physical Exam: BP (!) 146/71    Pulse 80    Ht 6' (1.829 m)    Wt 213 lb (96.6 kg)    BMI 28.89 kg/m   Constitutional:  Alert and oriented, No acute distress.  Accompanied by his wife today. HEENT: [place] AT, moist mucus membranes.  Trachea midline, no masses. Cardiovascular: No clubbing, cyanosis, or edema. Respiratory: Normal respiratory effort, no increased work of breathing. Skin: No rashes, bruises or suspicious lesions. Neurologic: Grossly intact, no focal deficits, moving all 4 extremities. Psychiatric: Normal mood and affect.   Laboratory Data:   Recent Labs       Lab Results   Component Value Date    CREATININE 1.06 06/26/2021      Recent Labs       Lab Results  Component Value Date    HGBA1C 5.2 03/17/2018        Pertinent Imaging: CLINICAL DATA:  Initial treatment strategy for lung nodule.   EXAM: NUCLEAR MEDICINE PET SKULL BASE TO THIGH   TECHNIQUE: 11.69 mCi F-18 FDG was injected intravenously. Full-ring PET imaging was performed from the skull base to thigh after the radiotracer. CT data was obtained and used for attenuation correction and anatomic localization.   Fasting blood glucose: 82 mg/dl   COMPARISON:  CT chest 06/13/2021 and CT AP 05/27/2021   FINDINGS: Mediastinal blood pool activity: SUV max 2.4   Liver activity: SUV max NA   NECK: No hypermetabolic lymph nodes in the neck.   Incidental CT findings: none   CHEST: Right lower lobe lung nodule measures 8 mm and has an SUV max of 0.75, image 122/3. Tiny peripheral nodule in the posterior right base measures 4 mm and is too small to characterize, image 130/3.   No tracer avid supraclavicular, axillary, mediastinal, or hilar lymph nodes.   Incidental CT findings: Mild cardiac enlargement. Previous median sternotomy and CABG procedure. Aortic atherosclerosis.   ABDOMEN/PELVIS: There is no abnormal FDG uptake within the liver, pancreas, spleen, or adrenal glands.   There is no tracer avid abdominopelvic lymph nodes. The left kidney mass is again identified. This measures approximately 4.1 cm and has an SUV max of 4.12, image 176/3   Incidental CT findings: Prostate gland is enlarged and has mass effect upon the bladder base.   SKELETON: No focal hypermetabolic activity to suggest skeletal metastasis.   Incidental CT findings: none   IMPRESSION: 1. There is no significant FDG uptake associated with the right lower lobe lung nodule. Given the small size of this nodule (8 mm) continued surveillance is recommended to confirm stability of this nodule as certain  low-grade pulmonary  neoplasms as well as renal cell carcinomas may exhibit low level FDG uptake on PET-CT. 2. Left kidney mass exhibits mild to moderate increased uptake within SUV max of 4.12. No signs of tracer avid nodal metastasis or solid organ metastasis within the abdomen or pelvis.     Electronically Signed   By: Kerby Moors M.D.   On: 08/02/2021 16:27   PET scan results reviewed personally along with the patient today.   Assessment & Plan:     1. Renal cell carcinoma of left kidney (HCC) The majority of the time was spent today talking about next steps for management of his biopsy-proven renal cell carcinoma.  PET scan indicates no metabolic activity in the solitary pulmonary nodule, will recommend surveillance and referral to medical oncology to help continue to survey this area.  Based on the relatively ill-defined borders and fairly central nature of the left-sided tumor, recommend radical nephrectomy.  Message does not appear to be easily amenable to partial nephrectomy.  We discussed the risks of surgery today in detail including the risk of severe life-threatening bleeding, infection, damage surrounding structures, hernia, as well as progression to chronic kidney disease in the setting of solitary kidney.  We discussed the preoperative, intraoperative, and postoperative care including lifting restrictions postop.  All of his questions were answered today.   He does have a small umbilical hernia on exam today which is likely fat-containing.  Is not bothersome to him.  We will try to avoid this intraoperatively.  He does not want to have this repaired.   We previously obtain cardiac clearance, will need to hold aspirin products perioperatively.   2. Malignant neoplasm of urinary bladder, unspecified site [facility]) Surgical pathology was reviewed today, low-grade noninvasive disease  We will plan for cystoscopy in 3 months and then 6 months thereafter if there is no  recurrence   3. Incomplete bladder emptying Presents for BPH and incomplete bladder emptying, will likely need an outlet procedure down the road.  In the meantime, continue current meds     [facility] [address], [place], [place] [address] [phone]     I spent 50 total minutes on the day of the encounter including pre-visit review of the medical record, face-to-face time with the patient, and post visit ordering of labs/imaging/tests.

## 2021-09-08 NOTE — Anesthesia Procedure Notes (Signed)
Procedure Name: Intubation ?Date/Time: 09/08/2021 9:28 AM ?Performed by: Aline Brochure, CRNA ?Pre-anesthesia Checklist: Patient identified, Patient being monitored, Timeout performed, Emergency Drugs available and Suction available ?Patient Re-evaluated:Patient Re-evaluated prior to induction ?Oxygen Delivery Method: Circle system utilized ?Preoxygenation: Pre-oxygenation with 100% oxygen ?Induction Type: IV induction ?Ventilation: Mask ventilation without difficulty ?Laryngoscope Size: McGraph and 4 ?Grade View: Grade I ?Tube type: Oral ?Tube size: 7.5 mm ?Number of attempts: 1 ?Airway Equipment and Method: Stylet and Video-laryngoscopy ?Placement Confirmation: ETT inserted through vocal cords under direct vision, positive ETCO2 and breath sounds checked- equal and bilateral ?Secured at: 23 cm ?Tube secured with: Tape ?Dental Injury: Teeth and Oropharynx as per pre-operative assessment  ?Difficulty Due To: Difficulty was anticipated and Difficult Airway- due to anterior larynx ? ? ? ? ?

## 2021-09-08 NOTE — Anesthesia Preprocedure Evaluation (Signed)
Anesthesia Evaluation  ?Patient identified by MRN, date of birth, ID band ?Patient awake ? ? ? ?Reviewed: ?Allergy & Precautions, NPO status , Patient's Chart, lab work & pertinent test results ? ?History of Anesthesia Complications ?Negative for: history of anesthetic complications ? ?Airway ?Mallampati: IV ? ?TM Distance: >3 FB ?Neck ROM: Full ? ? ? Dental ? ?(+) Teeth Intact ?  ?Pulmonary ?neg pulmonary ROS, neg sleep apnea, neg COPD, Patient abstained from smoking.Not current smoker, former smoker,  ?  ?Pulmonary exam normal ?breath sounds clear to auscultation ? ? ? ? ? ? Cardiovascular ?Exercise Tolerance: Good ?METS: 5 - 7 Mets hypertension, Pt. on medications ?pulmonary hypertension+ CAD, + Past MI, + Cardiac Stents and + CABG  ?(-) dysrhythmias  ?Rhythm:Regular Rate:Normal ?- Systolic murmurs ?TRANSTHORACIC ECHOCARDIOGRAM performed on 03/24/2018 ?1. Left ventricle: The cavity size was normal. There was mild concentric hypertrophy. Systolic function was normal. The?estimated ejection fraction was in the range of 55% to 60%. Wall motion was normal; there were no regional wall motion  abnormalities. Left ventricular diastolic function parameters were normal.  ?2. Mitral valve: There was mild regurgitation. Valve area by pressure half-time: 1.98 cm^2.  ?3. Left atrium: The atrium was moderately dilated.  ?4. Right ventricle: The cavity size was normal. Wall thickness was normal. Systolic function was normal.  ?5. Pulmonary arteries: Systolic pressure was mildly increased. PA peak pressure: 37 mm Hg (S).  ?6. Pericardium, extracardiac: A mild pericardial effusion was identified lateral to the left ventricle. Features were not consistent with tamponade physiology. ?? ?CORONARY ARTERY BYPASS GRAFTING PROCEDURE performed on 03/22/2018 ?1. 3 vessel CABG procedure ? LIMA-LAD ? SVG-OM ? SVG-RI ?  ?Neuro/Psych ?PSYCHIATRIC DISORDERS Depression negative neurological ROS ?   ?  GI/Hepatic ?GERD  Controlled,(+)  ?  ? (-) substance abuse ? ,   ?Endo/Other  ?neg diabetes ? Renal/GU ?Renal diseasenegative Renal ROS  ? ?  ?Musculoskeletal ? ? Abdominal ?  ?Peds ? Hematology ?  ?Anesthesia Other Findings ?Past Medical History: ?No date: Aortic atherosclerosis ([place]) ?07/03/2021: Bladder tumor ?    Comment:  a.) cystoscopy revealed 1.5 cm relatively flat papillary ?             tumor lateral to UO (LEFT); felt to be consistent with  ?             TCC ?03/15/2018: CAD (coronary artery disease) ?    Comment:  a.) NSTEMI 03/14/2018 --> LHC --> EF 50% with inferior  ?             HK; 80% pLAD, 99% OM1, 100% pRCA --> failed PCI (TIMI-1  ?             reduced to TIMI-0); transferred to [facility] and  ?             underwent 3v CABG (LIMA-LAD, SVG-OM, SVG-RI) on  ?             03/22/2018. ?No date: Colon polyps ?No date: Depression ?No date: Elevated PSA ?No date: Enlarged prostate ?No date: GERD (gastroesophageal reflux disease) ?No date: Hyperlipidemia ?No date: Hypertension ?No date: Nephrolithiasis ?03/14/2018: NSTEMI (non-ST elevated myocardial infarction) ([place]) ?    Comment:  a.) LHC 03/15/2018 --> 80% pLAD, 99% OM1, 100% pRCA -->  ?             failed PCI and [facility];  ?  underwent 3v CABG (LIMA-LAD, SVG-OM, SVG-RI) on  ?             03/25/2018. ?No date: Persistent atrial fibrillation ([place]) ?    Comment:  a.) CHA2DS2-VASc = 4 (age x2, HTN, prior MI). b.)  ?             rate/rhythm maintained without pharmacological  ?             intervention; no daily anticoagulation. ?05/27/2021: Renal cell cancer, left ([place]) ?    Comment:  a.) CT urogram --> 4.1 x 3.6 x 4.7 cm mass at LEFT  ?             kidney pole. b.) CNB (+) for RCC, conventional clear cell ?             type, nuclear grade II. c.) TURBT 07/28/2021 --> Bx (+)  ?             for low grade non-invasive papillary urothelial cell  ?             carcinoma; no muscularis propria involvement. d.) PET CT  ?              07/31/2021 revealed FDG avid LEFT renal mass (SUV 4.12);  ?             scheduled for nephrectomy. ?05/27/2021: Right lower lobe pulmonary nodule ?    Comment:  a.) CT chest --> measured 9 mm. ?03/22/2018: S/P CABG x 3 ?    Comment:  a.) 3v CABG: LIMA-LAD, SVG-OM, SVG-RI ?2009: SVT (supraventricular tachycardia) ([place]) ?    Comment:  a.) s/p SVT ablation while living in [place] ? Reproductive/Obstetrics ? ?  ? ? ? ? ? ? ? ? ? ? ? ? ? ?  ?  ? ? ? ? ? ? ? ? ?Anesthesia Physical ?Anesthesia Plan ? ?ASA: 3 ? ?Anesthesia Plan: General  ? ?Post-op Pain Management: Ofirmev IV (intra-op)* and Dilaudid IV  ? ?Induction: Intravenous ? ?PONV Risk Score and Plan: 4 or greater and Ondansetron, Dexamethasone, Midazolam and Treatment may vary due to age or medical condition ? ?Airway Management Planned: Oral ETT ? ?Additional Equipment: None ? ?Intra-op Plan:  ? ?Post-operative Plan: Extubation in OR ? ?Informed Consent: I have reviewed the patients History and Physical, chart, labs and discussed the procedure including the risks, benefits and alternatives for the proposed anesthesia with the patient or authorized representative who has indicated his/her understanding and acceptance.  ? ? ? ?Dental advisory given ? ?Plan Discussed with: CRNA and Surgeon ? ?Anesthesia Plan Comments: (Discussed risks of anesthesia with patient, including PONV, sore throat, lip/dental/eye damage. Rare risks discussed as well, such as bleeding requiring transfusion,  cardiorespiratory and neurological sequelae, and allergic reactions. Discussed the role of CRNA in patient's perioperative care. Patient understands.)  ? ? ? ? ? ? ?Anesthesia Quick Evaluation ? ?

## 2021-09-08 NOTE — Transfer of Care (Signed)
Immediate Anesthesia Transfer of Care Note ? ?Patient: David Mccullough ? ?Procedure(s) Performed: HAND ASSISTED LAPAROSCOPIC NEPHRECTOMY (Left) ? ?Patient Location: PACU ? ?Anesthesia Type:General ? ?Level of Consciousness: awake ? ?Airway & Oxygen Therapy: Patient Spontanous Breathing and Patient connected to face mask oxygen ? ?Post-op Assessment: Report given to RN and Post -op Vital signs reviewed and stable ? ?Post vital signs: Reviewed and stable ? ?Last Vitals:  ?Vitals Value Taken Time  ?BP 133/75 09/08/21 1145  ?Temp 35.9 ?C 09/08/21 1145  ?Pulse 80 09/08/21 1151  ?Resp 0 09/08/21 1150  ?SpO2 100 % 09/08/21 1151  ?Vitals shown include unvalidated device data. ? ?Last Pain:  ?Vitals:  ? 09/08/21 0733  ?TempSrc: Temporal  ?PainSc: 0-No pain  ?   ? ?  ? ?Complications: No notable events documented. ?

## 2021-09-08 NOTE — Anesthesia Postprocedure Evaluation (Signed)
Anesthesia Post Note ? ?Patient: David Mccullough ? ?Procedure(s) Performed: HAND ASSISTED LAPAROSCOPIC NEPHRECTOMY (Left) ? ?Patient location during evaluation: PACU ?Anesthesia Type: General ?Level of consciousness: awake and awake and alert ?Pain management: satisfactory to patient ?Vital Signs Assessment: post-procedure vital signs reviewed and stable ?Respiratory status: spontaneous breathing and respiratory function stable ?Cardiovascular status: stable ?Anesthetic complications: no ? ? ?No notable events documented. ? ? ?Last Vitals:  ?Vitals:  ? 09/08/21 0733 09/08/21 1145  ?BP: 139/71 133/75  ?Pulse: 75 84  ?Resp: 18 19  ?Temp: 36.6 ?C (!) 35.9 ?C  ?SpO2: 97% 96%  ?  ?Last Pain:  ?Vitals:  ? 09/08/21 0733  ?TempSrc: Temporal  ?PainSc: 0-No pain  ? ? ?  ?  ?  ?  ?  ?  ? ?VAN STAVEREN,Castiel Lauricella ? ? ? ? ?

## 2021-09-08 NOTE — Op Note (Signed)
PREOP DIAGNOSIS: ?Left renal mass ? ?POSTOPERATIVE DIAGNOSIS: ?Left renal mass ? ?OPERATION PERFORMED: ?Hand-assisted laparoscopic left radical nephrectomy. ? ?SURGEON: ?Hollice Espy, MD ? ?Assistant: ?Nickolas Madrid, MD ? ?ANESTHESIA: ?General. ? ?ESTIMATED BLOOD LOSS: ?50  cc ? ? ?DRAINS: ?16-French Foley catheter. ? ? ?COMPLICATIONS: ?None. ? ?Indications: Biopsy-proven renal cell carcinoma, left-sided ? ?FINDINGS: Scarring from previous needle biopsy, otherwise unremarkable ? ? ? ?DESCRIPTION OF OPERATION: ?Informed consent was obtained. The patient was marked on the left side. IV ?antibiotics were given for bacterial prophylaxis on call to the ?operating room. SCDs were provided for DVT prophylaxis. The ?patient was taken to the operating room and placed supine on the ?operating table. General anesthesia was provided. A Foley ?catheter was placed to drain the bladder. ? ?The patient was positioned in right lateral decubitus with the ?left flank elevated about 70 degrees and the table flexed ?slightly. The left arm was placed in a padded airplane for ?support.  No axillary roll was used as the patient was in sloppy flank and not on the axilla. The patient was secured ?to the table with soft straps and then prepped and draped ?sterilely. ? ? ?We had a time-out confirming the patient identification, planned ?procedure, surgical site, and all present were in agreement. All present were in agreement. ? ? ?A 8 cm incision was made for the hand port in the left lower quadrant. The anterior ?fascia was incised and elevated. The rectus belly was retracted medially and the peritoneum was incised. An incisional block ?was provided with liposomal Marcaine. The GelPort was assembled. ?A 12 mm trocar was placed above the umbilicus, and then the ?abdomen was insufflated. Laparoscopic survey revealed no ?abnormalities or injuries. An additional 12 mm trocar was just below  the subxiphoid. All ?port sites were then infiltrated  with liposomal Marcaine. Zero ?Vicryls were placed at the 12 mm trocar sites with the ?Carter-Thomason device for closure at the end of the case. ? ? ?The white line of Toldt was incised. The colon was reflected from ?the spleen to the pelvis. Gerota's fascia was lifted up off the ?lower pole of the kidney and the ureter and gonadal vessels were ?identified. The gonadal vein was exposed and followed up to the ?main renal vein. The branch point of the gonadal vein and ?adrenal vein were exposed at the renal vein. The upper pole ?of the kidney was mobilized off of the quadratus lumborum and ?further mobilized away from the spleen. The ?lower pole and lateral attachments were freed. The kidney was ?held laterally and the hilar dissection was completed.  Using a 60 mm vascular load stapler, the hilum was taken en bloc.  I then took another 2 staple loads medial and superiorly to free up this portion of the kidney. ? ? ? The ureter was exposed, clipped distally using Weck clips, and divided sharply. ?The ureter stump was confirmed hemostatic. ? ?  An endocatch bag was then used to bag the specimen.  The kidney was extracted through the gel port and passed off for pathological analysis.  The abdominal cavity was copiously irrigated with water as where the port sites. ? ?Pneumoperitoneal pressure was reduced to 7 mmHg and the abdomen ?was inspected; hemostasis was confirmed. The 12 mm trocars were ?removed and the port sites closed with previously placed 0 ?Vicryl. The Gelport was removed. The anterior fascia was closed ?with a running 1 PDS.  All the incisions were irrigated, patted dry, and then the skin was reapproximated with 4-0 Monocryl  in a subcuticular fashion. The wounds were cleaned and dried and ?covered with Dermabond. All sponge, needle, and instrument counts ?were reported correct x2. ? ? ?The patient was awakened from anesthesia and transferred to ?recovery in stable condition. There were no complications.  The ?patient tolerated the procedure well. ? ?An assistant was required for this surgical procedure. The duties of the assistant included but were not limited to suctioning, passing suture, camera manipulation, retraction. This procedure would not be able to be performed without an Environmental consultant.  ? ?______________________________ ? ? ? ?Hollice Espy, MD ? ? ? ?

## 2021-09-09 ENCOUNTER — Encounter: Payer: Self-pay | Admitting: Urology

## 2021-09-09 LAB — BASIC METABOLIC PANEL
Anion gap: 5 (ref 5–15)
BUN: 20 mg/dL (ref 8–23)
CO2: 24 mmol/L (ref 22–32)
Calcium: 9 mg/dL (ref 8.9–10.3)
Chloride: 108 mmol/L (ref 98–111)
Creatinine, Ser: 1.68 mg/dL — ABNORMAL HIGH (ref 0.61–1.24)
GFR, Estimated: 42 mL/min — ABNORMAL LOW (ref >60)
Glucose, Bld: 142 mg/dL — ABNORMAL HIGH (ref 70–99)
Potassium: 4.3 mmol/L (ref 3.5–5.1)
Sodium: 137 mmol/L (ref 135–145)

## 2021-09-09 LAB — CBC
HCT: 37.5 % — ABNORMAL LOW (ref 39.0–52.0)
Hemoglobin: 12.6 g/dL — ABNORMAL LOW (ref 13.0–17.0)
MCH: 28.4 pg (ref 26.0–34.0)
MCHC: 33.6 g/dL (ref 30.0–36.0)
MCV: 84.7 fL (ref 80.0–100.0)
Platelets: 133 10*3/uL — ABNORMAL LOW (ref 150–400)
RBC: 4.43 MIL/uL (ref 4.22–5.81)
RDW: 13.3 % (ref 11.5–15.5)
WBC: 11.6 10*3/uL — ABNORMAL HIGH (ref 4.0–10.5)
nRBC: 0 % (ref 0.0–0.2)

## 2021-09-09 MED ORDER — MIRABEGRON ER 25 MG PO TB24
25.0000 mg | ORAL_TABLET | Freq: Every day | ORAL | Status: DC
  Administered 2021-09-09: 25 mg via ORAL
  Filled 2021-09-09: qty 1

## 2021-09-09 MED ORDER — OXYCODONE-ACETAMINOPHEN 5-325 MG PO TABS: 1.0000 | ORAL_TABLET | ORAL | 0 refills | Status: DC | PRN

## 2021-09-09 MED ORDER — DOCUSATE SODIUM 100 MG PO CAPS: 100.0000 mg | ORAL_CAPSULE | Freq: Two times a day (BID) | ORAL | 0 refills | Status: DC

## 2021-09-09 NOTE — TOC Initial Note (Signed)
Transition of Care (TOC) - Initial/Assessment Note  ? ? ?Patient Details  ?Name: David Mccullough ?MRN: [id number] ?Date of Birth: 03/12/1946 ? ?Transition of Care (TOC) CM/SW Contact:    ?Beverly Sessions, RN ?Phone Number: ?09/09/2021, 9:28 AM ? ?Clinical Narrative:                 ? ? ?Transition of Care (TOC) Screening Note ? ? ?Patient Details  ?Name: David Mccullough ?Date of Birth: 1945/08/26 ? ? ?Transition of Care (TOC) CM/SW Contact:    ?Beverly Sessions, RN ?Phone Number: ?09/09/2021, 9:28 AM ? ? ? ?Transition of Care Department [facility]) has reviewed patient and no TOC needs have been identified at this time. We will continue to monitor patient advancement through interdisciplinary progression rounds. If new patient transition needs arise, please place a TOC consult. ? ? ?  ?  ? ? ?Patient Goals and CMS Choice ?  ?  ?  ? ?Expected Discharge Plan and Services ?  ?  ?  ?  ?  ?Expected Discharge Date: 09/09/21               ?  ?  ?  ?  ?  ?  ?  ?  ?  ?  ? ?Prior Living Arrangements/Services ?  ?  ?  ?       ?  ?  ?  ?  ? ?Activities of Daily Living ?Home Assistive Devices/Equipment: None ?ADL Screening (condition at time of admission) ?Patient's cognitive ability adequate to safely complete daily activities?: Yes ?Is the patient deaf or have difficulty hearing?: No ?Does the patient have difficulty seeing, even when wearing glasses/contacts?: No ?Does the patient have difficulty concentrating, remembering, or making decisions?: No ?Patient able to express need for assistance with ADLs?: Yes ?Does the patient have difficulty dressing or bathing?: No ?Independently performs ADLs?: Yes (appropriate for developmental age) ?Does the patient have difficulty walking or climbing stairs?: No ?Weakness of Legs: None ?Weakness of Arms/Hands: None ? ?Permission Sought/Granted ?  ?  ?   ?   ?   ?   ? ?Emotional Assessment ?  ?  ?  ?  ?  ?  ? 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 Diagnosis Date Noted  ? 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 Elevated PSA 08/02/2018  ? 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 NSTEMI (non-ST elevated myocardial infarction) ([place]) 03/14/2018  ? ?PCP:  Juline Patch, MD ?Pharmacy:   ?[facility] [place] [address] [place] ?[place] [address] ?Phone: [phone] Fax: [phone] ? ? ? ? ?Social Determinants of Health (SDOH) Interventions ?  ? ?Readmission Risk Interventions ?No flowsheet data found. ? ? ?

## 2021-09-09 NOTE — Discharge Instructions (Signed)
·   Activity:  You are encouraged to ambulate frequently (about every hour during waking hours) to help prevent blood clots from forming in your legs or lungs.  However, you should not engage in any heavy lifting (> 5-10 lbs), strenuous activity, or straining. ° °· Diet: You should advance your diet as instructed by your physician.  It will be normal to have some bloating, nausea, and abdominal discomfort intermittently. ° °· Prescriptions:  You will be provided a prescription for pain medication to take as needed.  If your pain is not severe enough to require the prescription pain medication, you may take extra strength Tylenol instead which will have less side effects.  You should also take a prescribed stool softener to avoid straining with bowel movements as the prescription pain medication may constipate you. ° °· Incisions: You may remove your dressing bandages 48 hours after surgery if not removed in the hospital.  You will either have some small staples or special tissue glue at each of the incision sites. Once the bandages are removed (if present), the incisions may stay open to air.  You may start showering (but not soaking or bathing in water) the 2nd day after surgery and the incisions simply need to be patted dry after the shower.  No additional care is needed. ° °What to call us about: You should call the office if you develop fever > 101 or develop persistent vomiting, redness or draining around your incision, or any other concerning symptoms.   ° °[facility] °[address], [address] °[place], [place] [address] °[phone] ° ° °

## 2021-09-09 NOTE — Progress Notes (Addendum)
Pt DC home, discharge education completed. Pt had been c/o epigastric pain earlier. Percocet administered and pt walked in the room for about two hours. Pt stated he felt better after walking, pt did request Tylenol before discharge for post surgical pain of 3. Wife at bedside. MD Erlene Quan made aware of the situation.  ?

## 2021-09-09 NOTE — Discharge Summary (Signed)
Date of admission: 09/08/2021 ? ?Date of discharge: 09/09/2021 ? ?Admission diagnosis: Left renal cell carcinoma ? ?Discharge diagnosis: Same as above ? ?Secondary diagnoses:  ?Patient Active Problem List  ? Diagnosis Date Noted  ? 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 Elevated PSA 08/02/2018  ? 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 NSTEMI (non-ST elevated myocardial infarction) ([place]) 03/14/2018  ? ? ?History and Physical: For full details, please see admission history and physical. Briefly, David Mccullough is a 76 y.o. year old patient with left renal mass status post uncomplicated left hand-assisted nephrectomy admitted for postoperative observation.  ? ?Hospital Course: Patient tolerated the procedure well.  He was then transferred to the floor after an uneventful PACU stay.  His hospital course was uncomplicated.  On POD#1 he had met discharge criteria: was eating a regular diet, was up and ambulating independently,  pain was well controlled, was voiding without a catheter, and was ready to for discharge. ? ?Exam: ?Blood pressure 114/60, pulse (!) 41, temperature 98.4 ?F (36.9 ?C), temperature source Oral, resp. rate 18, height 6' (1.829 m), weight 95.3 kg, SpO2 96 %. ?Sitting up, alert and oriented ?Clear to auscultation bilaterally, no increased work of breathing ?Abdomen is soft, appropriately tender without rebound or guarding.  Abdominal incisions appear to be intact.  Mild bruising around the midline port sites but no erythema or drainage. ?No lower extremity edema ?Foley catheter draining clear yellow urine ? ?Laboratory values:  ?Recent Labs  ?  09/09/21 ?0412  ?WBC 11.6*  ?HGB 12.6*  ?HCT 37.5*  ? ?Recent Labs  ?  09/09/21 ?0412  ?NA 137  ?K 4.3  ?CL 108  ?CO2 24  ?GLUCOSE 142*  ?BUN 20  ?CREATININE 1.68*  ?CALCIUM 9.0  ? ?Disposition: Home ? ?Discharge instruction: The patient was instructed to be ambulatory but told to refrain from heavy  lifting, strenuous activity, or driving.  ? ?Discharge medications:  ?Allergies as of 09/09/2021   ?No Known Allergies ?  ? ?  ?Medication List  ?  ? ?TAKE these medications   ? ?aspirin EC 81 MG tablet ?Take 81 mg by mouth daily. Swallow whole. ?  ?atorvastatin 80 MG tablet ?Commonly known as: LIPITOR ?TAKE 1 TABLET(80 MG) BY MOUTH DAILY AT 6 PM ?  ?calcium carbonate 500 MG chewable tablet ?Commonly known as: TUMS - dosed in mg elemental calcium ?Chew 1 tablet by mouth as needed for indigestion or heartburn. ?  ?docusate sodium 100 MG capsule ?Commonly known as: COLACE ?Take 1 capsule (100 mg total) by mouth 2 (two) times daily. ?  ?finasteride 5 MG tablet ?Commonly known as: PROSCAR ?Take 1 tablet (5 mg total) by mouth daily. ?What changed: when to take this ?  ?FISH OIL PO ?Take 1 tablet by mouth daily. ?  ?Gemtesa 75 MG Tabs ?Generic drug: Vibegron ?Take 75 mg by mouth daily. ?What changed: when to take this ?  ?multivitamin with minerals tablet ?Take 1 tablet by mouth daily. ?  ?oxyCODONE-acetaminophen 5-325 MG tablet ?Commonly known as: Percocet ?Take 1-2 tablets by mouth every 4 (four) hours as needed for moderate pain or severe pain. ?  ?tamsulosin 0.4 MG Caps capsule ?Commonly known as: FLOMAX ?Take 1 capsule (0.4 mg total) by mouth daily. ?What changed: when to take this ?  ?traZODone 50 MG tablet ?Commonly known as: DESYREL ?TAKE 1 TABLET(50 MG) BY MOUTH AT BEDTIME ?  ? ?  ? ? ?Followup:  ?  Follow-up Information   ? ? Hollice Espy, MD Follow up in 4 week(s).   ?Specialty: Urology ?Contact information: ?[address] ?[place] [address] ?[phone] ? ? ?  ?  ? ?  ?  ? ?  ? ? ?

## 2021-09-10 LAB — SURGICAL PATHOLOGY

## 2021-09-11 ENCOUNTER — Telehealth: Payer: Self-pay | Admitting: *Deleted

## 2021-09-11 NOTE — Telephone Encounter (Signed)
Patient called concerned with constipation post surgery. He has been taking colace and one tablet of dulcolax. Denies abdominal pain or other symptoms. Patient states he will try Miralax one capful, advised to ensure he is hydrated. Any other suggestions? ?

## 2021-09-11 NOTE — Telephone Encounter (Signed)
No this is appropriate. ? ?Hollice Espy, MD ? ?

## 2021-09-11 NOTE — Telephone Encounter (Addendum)
Patient informed, voiced understanding.  ? ? ?----- Message from Hollice Espy, MD sent at 09/11/2021  7:52 AM EDT ----- ?Please let this patient know that all of his kidney cancer was removed, margins were negative ? ?Hollice Espy, MD ? ?

## 2021-09-12 ENCOUNTER — Ambulatory Visit: Payer: Medicare Other | Admitting: Urology

## 2021-09-19 ENCOUNTER — Telehealth: Payer: Self-pay

## 2021-09-19 NOTE — Telephone Encounter (Signed)
Incoming call on triage line from pt in regards to a slight pain around pt's belt line. He states that whatever this pain is has not gone away since surgery. He states it is not a bad pain, more of an annoyance. He would like to know if this is normal. Pt denies any other symptoms beyond this. Advised pt to continue taking tylenol and he can inquire more at his post op appt in April. Pt expressed understanding.  ?

## 2021-10-13 ENCOUNTER — Ambulatory Visit: Payer: Medicare Other

## 2021-10-13 NOTE — Progress Notes (Signed)
? ?10/14/2021 ?1:25 PM  ? ?David Mccullough ?Oct 08, 1945 ?[id number] ? ?Referring provider:  ?Juline Patch, MD ?[address] ?[address] ?[place],  [place] [address] ?Chief Complaint  ?Patient presents with  ? Renal cell carcinoma of left kidney (HCC)  ? ? ? ?HPI: ?David Mccullough is a 76 y.o.male with a personal history of renal cell carcinoma of left kidney, malignant neoplasm of urinary bladder,urinary urgency, incomplete bladder emptying, elevated PSA, microscopic hematuria, and pulmonary nodule. He presents today for a 1 month post-op follow-up.  ?  ?Renal biopsy on 06/26/2021 surgical pathology revealed renal cell carcinoma, conventional clear-cell type, nuclear grade 2. ? ?He is s/p TURBT with instillation of intravesical chemotherapy on 07/28/2021.Intraoperative findings: Flat broad-based superficial appearing 1.5 cm papillary left posterior bladder wall tumor consistent with transitional cell carcinoma.  No other lesions in the bladder identified.  Mildly trabeculated bladder. ?  ?Surgical pathology of bladder specimen consistent with  non-invasive papillary urothelial carcinoma, low grade.  ?   ?NM PET scan on 07/31/2021 visualized small (25m) nodule of right lower lobe lung nodule. Left kidney mass exhibits mild to moderate increased uptake within SUV max of 4.12. No signs of tracer avid nodal metastasis or solid organ metastasis within the abdomen or pelvis. ? ?He is s/p hand-assisted laparoscopic left radical nephrectomy on 09/08/2021.  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 pT1bNxMo. ? ? ?PMH: ?Past Medical History:  ?Diagnosis Date  ? Aortic atherosclerosis ([place]   ? Bladder tumor 07/03/2021  ? a.) cystoscopy revealed 1.5 cm relatively flat papillary tumor lateral to UO (LEFT); felt to be consistent with TCC  ? CAD (coronary artery disease) 03/15/2018  ? a.) NSTEMI 03/14/2018 --> LHC --> EF 50% with inferior HK; 80% pLAD, 99% OM1, 100% pRCA --> failed PCI (TIMI-1  reduced to TIMI-0); [facility] (LIMA-LAD, SVG-OM, SVG-RI) on 03/22/2018.  ? Colon polyps   ? Depression   ? Elevated PSA   ? Enlarged prostate   ? GERD (gastroesophageal reflux disease)   ? Hyperlipidemia   ? Hypertension   ? Nephrolithiasis   ? NSTEMI (non-ST elevated myocardial infarction) ([place] 03/14/2018  ? a.) LHC 03/15/2018 --> 80% pLAD, 99% OM1, 100% pRCA --> [facility]; underwent 3v CABG (LIMA-LAD, SVG-OM, SVG-RI) on 03/25/2018.  ? Persistent atrial fibrillation (HCC)   ? a.) CHA2DS2-VASc = 4 (age x2, HTN, prior MI). b.) rate/rhythm maintained without pharmacological intervention; no daily anticoagulation.  ? Renal cell cancer, left ([place] 05/27/2021  ? a.) CT urogram --> 4.1 x 3.6 x 4.7 cm mass at LEFT kidney pole. b.) CNB (+) for RCC, conventional clear cell type, nuclear grade II. 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 d.) PET CT 07/31/2021 revealed FDG avid LEFT renal mass (SUV 4.12); scheduled for nephrectomy.  ? Right lower lobe pulmonary nodule 05/27/2021  ? a.) CT chest --> measured 9 mm.  ? 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 a.) 3v CABG: LIMA-LAD, SVG-OM, SVG-RI  ? SVT (supraventricular tachycardia) ([place] 2009  ? a.) [place] ? ? ?Surgical History: ?Past Surgical History:  ?Procedure Laterality Date  ? COLONOSCOPY    ? CORONARY ARTERY BYPASS GRAFT N/A 03/22/2018  ? Procedure: CORONARY ARTERY BYPASS GRAFTING (CABG) x 3 WITH ENDOSCOPIC HARVESTING OF RIGHT GREATER SAPHENOUS VEIN;  Surgeon: VIvin Poot MD;  [facility];  Service: Open Heart Surgery;  Laterality: N/A;  ? CORONARY STENT INTERVENTION N/A 03/16/2018  ? Procedure: CORONARY STENT INTERVENTION;  Surgeon: Yolonda Kida, MD;  Location: [facility];  Service: Cardiovascular;  Laterality: N/A;  ? LAPAROSCOPIC NEPHRECTOMY, HAND  ASSISTED Left 09/08/2021  ? Procedure: HAND ASSISTED LAPAROSCOPIC NEPHRECTOMY;  Surgeon: Hollice Espy, MD;  Location: [facility] ORS;  Service: Urology;  Laterality: Left;  ? LEFT ATRIAL APPENDAGE OCCLUSION N/A 03/22/2018  ? Procedure: LEFT ATRIAL APPENDAGE OCCLUSION USING 40 MM ATRICURE ATRICLIP;  Surgeon: Ivin Poot, MD;  Location: [facility];  Service: Open Heart Surgery;  Laterality: N/A;  ? LEFT HEART CATH AND CORONARY ANGIOGRAPHY N/A 03/15/2018  ? Procedure: LEFT HEART CATH AND CORONARY ANGIOGRAPHY and PCI stent;  Surgeon: Yolonda Kida, MD;  Location: [facility];  Service: Cardiovascular;  Laterality: N/A;  ? SVT ABLATION N/A 2009  ? Procedure: SVT ABLATION; Location: performed while living in [place].  ? TEE WITHOUT CARDIOVERSION N/A 03/22/2018  ? Procedure: TRANSESOPHAGEAL ECHOCARDIOGRAM (TEE);  Surgeon: Prescott Gum, Collier Salina, MD;  Location: [facility];  Service: Open Heart Surgery;  Laterality: N/A;  ? TRANSURETHRAL RESECTION OF BLADDER TUMOR N/A 07/28/2021  ? Procedure: TRANSURETHRAL RESECTION OF BLADDER TUMOR (TURBT)WITH  GEMCITABINE;  Surgeon: Hollice Espy, MD;  Location: [facility] ORS;  Service: Urology;  Laterality: N/A;  ? ? ?Home Medications:  ?Allergies as of 10/14/2021   ?No Known Allergies ?  ? ?  ?Medication List  ?  ? ?  ? Accurate as of October 14, 2021  1:25 PM. If you have any questions, ask your nurse or doctor.  ?  ?  ? ?  ? ?STOP taking these medications   ? ?oxyCODONE-acetaminophen 5-325 MG tablet ?Commonly known as: Percocet ?  ? ?  ? ?TAKE these medications   ? ?aspirin EC 81 MG tablet ?Take 81 mg by mouth daily. Swallow whole. ?  ?atorvastatin 80 MG tablet ?Commonly known as: LIPITOR ?TAKE 1 TABLET(80 MG) BY MOUTH DAILY AT 6 PM ?  ?calcium carbonate 500 MG chewable tablet ?Commonly known as: TUMS - dosed in mg elemental calcium ?Chew 1 tablet by mouth as needed for indigestion or heartburn. ?  ?docusate sodium 100 MG capsule ?Commonly known as: COLACE ?Take 1 capsule (100 mg total) by  mouth 2 (two) times daily. ?  ?finasteride 5 MG tablet ?Commonly known as: PROSCAR ?Take 1 tablet (5 mg total) by mouth daily. ?What changed: when to take this ?  ?FISH OIL PO ?Take 1 tablet by mouth daily. ?  ?Gemtesa 75 MG Tabs ?Generic drug: Vibegron ?Take 75 mg by mouth daily. ?What changed: when to take this ?  ?multivitamin with minerals tablet ?Take 1 tablet by mouth daily. ?  ?tamsulosin 0.4 MG Caps capsule ?Commonly known as: FLOMAX ?Take 1 capsule (0.4 mg total) by mouth daily. ?What changed: when to take this ?  ?traZODone 50 MG tablet ?Commonly known as: DESYREL ?TAKE 1 TABLET(50 MG) BY MOUTH AT BEDTIME ?  ? ?  ? ? ?Allergies:  ?No Known Allergies ? ?Family History: ?Family History  ?Problem Relation Age of Onset  ? Heart disease Mother   ? Heart disease Father   ? Arrhythmia Sister   ? Heart disease Sister   ? ? ?Social History:  reports that he quit smoking about 34 years ago. His smoking use included cigarettes. He has a 25.00 pack-year smoking history. He has never been exposed to tobacco smoke. He has  never used smokeless tobacco. He reports that he does not currently use alcohol. He reports that he does not currently use drugs. ? ? ?Physical Exam: ?BP (!) 154/80   Pulse 71   Ht 6' (1.829 m)   Wt 210 lb (95.3 kg)   BMI 28.48 kg/m?   ?Constitutional:  Alert and oriented, No acute distress. ?HEENT: [place] AT, moist mucus membranes.  Trachea midline, no masses. ?Cardiovascular: No clubbing, cyanosis, or edema. ?Respiratory: Normal respiratory effort, no increased work of breathing. ?Skin: No rashes, bruises or suspicious lesions. ?Neurologic: Grossly intact, no focal deficits, moving all 4 extremities. ?Psychiatric: Normal mood and affect. ? ?Laboratory Data: ?Lab Results  ?Component Value Date  ? CREATININE 1.68 (H) 09/09/2021  ? ?Lab Results  ?Component Value Date  ? HGBA1C 5.2 03/17/2018  ? ? ?Assessment & Plan:   ?Renal cell carcinoma of left kidney [facility])  ?- S/p and-assisted laparoscopic left  radical nephrectomy on 09/08/2021. ?- Margins are negative  ?- He is clear to return to activities  ?-BMP today, consider referral to nephrology if his creatinine remains consistent with stage III CKD as at the time of [facility]

## 2021-10-14 ENCOUNTER — Ambulatory Visit (INDEPENDENT_AMBULATORY_CARE_PROVIDER_SITE_OTHER): Payer: Medicare Other | Admitting: Urology

## 2021-10-14 ENCOUNTER — Telehealth: Payer: Self-pay | Admitting: Urology

## 2021-10-14 VITALS — BP 154/80 | HR 71 | Ht 72.0 in | Wt 210.0 lb

## 2021-10-14 DIAGNOSIS — C642 Malignant neoplasm of left kidney, except renal pelvis: Secondary | ICD-10-CM | POA: Diagnosis not present

## 2021-10-14 DIAGNOSIS — R3915 Urgency of urination: Secondary | ICD-10-CM

## 2021-10-14 MED ORDER — GEMTESA 75 MG PO TABS: 75.0000 mg | ORAL_TABLET | Freq: Every day | ORAL | 11 refills | Status: DC

## 2021-10-14 NOTE — Telephone Encounter (Signed)
Sent to pharmacy as requested, patient informed. ?

## 2021-10-14 NOTE — Telephone Encounter (Signed)
Patient called and said that he was seen this morning and he asked for a refill on Gemtesa but it was not sent to the pharmacy. Can you check on this please. ? ?Sharyn Lull ?

## 2021-10-15 ENCOUNTER — Telehealth: Payer: Self-pay

## 2021-10-15 ENCOUNTER — Ambulatory Visit (INDEPENDENT_AMBULATORY_CARE_PROVIDER_SITE_OTHER): Payer: Medicare Other

## 2021-10-15 DIAGNOSIS — Z0000 Encounter for general adult medical examination without abnormal findings: Secondary | ICD-10-CM

## 2021-10-15 DIAGNOSIS — C642 Malignant neoplasm of left kidney, except renal pelvis: Secondary | ICD-10-CM

## 2021-10-15 LAB — BASIC METABOLIC PANEL
BUN/Creatinine Ratio: 13 (ref 10–24)
BUN: 22 mg/dL (ref 8–27)
CO2: 23 mmol/L (ref 20–29)
Calcium: 9.9 mg/dL (ref 8.6–10.2)
Chloride: 104 mmol/L (ref 96–106)
Creatinine, Ser: 1.76 mg/dL — ABNORMAL HIGH (ref 0.76–1.27)
Glucose: 82 mg/dL (ref 70–99)
Potassium: 4.5 mmol/L (ref 3.5–5.2)
Sodium: 143 mmol/L (ref 134–144)
eGFR: 40 mL/min/{1.73_m2} — ABNORMAL LOW (ref >59)

## 2021-10-15 NOTE — Telephone Encounter (Signed)
-----   Message from Hollice Espy, MD sent at 10/15/2021  8:08 AM EDT ----- ?As per our discussion, I think its a good idea to get plugged in with nephrology, please place referral and let him know.   ? ?Hollice Espy, MD ? ?

## 2021-10-15 NOTE — Telephone Encounter (Signed)
Patient aware. Referral was placed. ?

## 2021-10-15 NOTE — Patient Instructions (Signed)
David Mccullough , ?Thank you for taking time to come for your Medicare Wellness Visit. I appreciate your ongoing commitment to your health goals. Please review the following plan we discussed and let me know if I can assist you in the future.  ? ?Screening recommendations/referrals: ?Colonoscopy: no longer required ?Recommended yearly ophthalmology/optometry visit for glaucoma screening and checkup ?Recommended yearly dental visit for hygiene and checkup ? ?Vaccinations: ?Influenza vaccine: done 03/29/21 ?Pneumococcal vaccine: done 01/29/20 ?Tdap vaccine: due ?Shingles vaccine: Shingrix discussed. Please contact your pharmacy for coverage information.  ?Covid-19: done 08/11/19, 09/06/19 & 03/29/20 ? ?Advanced directives: Please bring a copy of your health care power of attorney and living will to the office at your convenience.  ? ?Conditions/risks identified: Recommend drinking 6-8 glasses of water per day  ? ?Next appointment: Follow up in one year for your annual wellness visit.  ? ?Preventive Care 37 Years and Older, Male ?Preventive care refers to lifestyle choices and visits with your health care provider that can promote health and wellness. ?What does preventive care include? ?A yearly physical exam. This is also called an annual well check. ?Dental exams once or twice a year. ?Routine eye exams. Ask your health care provider how often you should have your eyes checked. ?Personal lifestyle choices, including: ?Daily care of your teeth and gums. ?Regular physical activity. ?Eating a healthy diet. ?Avoiding tobacco and drug use. ?Limiting alcohol use. ?Practicing safe sex. ?Taking low doses of aspirin every day. ?Taking vitamin and mineral supplements as recommended by your health care provider. ?What happens during an annual well check? ?The services and screenings done by your health care provider during your annual well check will depend on your age, overall health, lifestyle risk factors, and family history of  disease. ?Counseling  ?Your health care provider may ask you questions about your: ?Alcohol use. ?Tobacco use. ?Drug use. ?Emotional well-being. ?Home and relationship well-being. ?Sexual activity. ?Eating habits. ?History of falls. ?Memory and ability to understand (cognition). ?Work and work Statistician. ?Screening  ?You may have the following tests or measurements: ?Height, weight, and BMI. ?Blood pressure. ?Lipid and cholesterol levels. These may be checked every 5 years, or more frequently if you are over 61 years old. ?Skin check. ?Lung cancer screening. You may have this screening every year starting at age 3 if you have a 30-pack-year history of smoking and currently smoke or have quit within the past 15 years. ?Fecal occult blood test (FOBT) of the stool. You may have this test every year starting at age 39. ?Flexible sigmoidoscopy or colonoscopy. You may have a sigmoidoscopy every 5 years or a colonoscopy every 10 years starting at age 37. ?Prostate cancer screening. Recommendations will vary depending on your family history and other risks. ?Hepatitis C blood test. ?Hepatitis B blood test. ?Sexually transmitted disease (STD) testing. ?Diabetes screening. This is done by checking your blood sugar (glucose) after you have not eaten for a while (fasting). You may have this done every 1-3 years. ?Abdominal aortic aneurysm (AAA) screening. You may need this if you are a current or former smoker. ?Osteoporosis. You may be screened starting at age 67 if you are at high risk. ?Talk with your health care provider about your test results, treatment options, and if necessary, the need for more tests. ?Vaccines  ?Your health care provider may recommend certain vaccines, such as: ?Influenza vaccine. This is recommended every year. ?Tetanus, diphtheria, and acellular pertussis (Tdap, Td) vaccine. You may need a Td booster every 10 years. ?Zoster  vaccine. You may need this after age 66. ?Pneumococcal 13-valent  conjugate (PCV13) vaccine. One dose is recommended after age 65. ?Pneumococcal polysaccharide (PPSV23) vaccine. One dose is recommended after age 3. ?Talk to your health care provider about which screenings and vaccines you need and how often you need them. ?This information is not intended to replace advice given to you by your health care provider. Make sure you discuss any questions you have with your health care provider. ?Document Released: 07/12/2015 Document Revised: 03/04/2016 Document Reviewed: 04/16/2015 ?Elsevier Interactive Patient Education ? 2017 [place]. ? ?Fall Prevention in the Home ?Falls can cause injuries. They can happen to people of all ages. There are many things you can do to make your home safe and to help prevent falls. ?What can I do on the outside of my home? ?Regularly fix the edges of walkways and driveways and fix any cracks. ?Remove anything that might make you trip as you walk through a door, such as a raised step or threshold. ?Trim any bushes or trees on the path to your home. ?Use bright outdoor lighting. ?Clear any walking paths of anything that might make someone trip, such as rocks or tools. ?Regularly check to see if handrails are loose or broken. Make sure that both sides of any steps have handrails. ?Any raised decks and porches should have guardrails on the edges. ?Have any leaves, snow, or ice cleared regularly. ?Use sand or salt on walking paths during winter. ?Clean up any spills in your garage right away. This includes oil or grease spills. ?What can I do in the bathroom? ?Use night lights. ?Install grab bars by the toilet and in the tub and shower. Do not use towel bars as grab bars. ?Use non-skid mats or decals in the tub or shower. ?If you need to sit down in the shower, use a plastic, non-slip stool. ?Keep the floor dry. Clean up any water that spills on the floor as soon as it happens. ?Remove soap buildup in the tub or shower regularly. ?Attach bath mats  securely with double-sided non-slip rug tape. ?Do not have throw rugs and other things on the floor that can make you trip. ?What can I do in the bedroom? ?Use night lights. ?Make sure that you have a light by your bed that is easy to reach. ?Do not use any sheets or blankets that are too big for your bed. They should not hang down onto the floor. ?Have a firm chair that has side arms. You can use this for support while you get dressed. ?Do not have throw rugs and other things on the floor that can make you trip. ?What can I do in the kitchen? ?Clean up any spills right away. ?Avoid walking on wet floors. ?Keep items that you use a lot in easy-to-reach places. ?If you need to reach something above you, use a strong step stool that has a grab bar. ?Keep electrical cords out of the way. ?Do not use floor polish or wax that makes floors slippery. If you must use wax, use non-skid floor wax. ?Do not have throw rugs and other things on the floor that can make you trip. ?What can I do with my stairs? ?Do not leave any items on the stairs. ?Make sure that there are handrails on both sides of the stairs and use them. Fix handrails that are broken or loose. Make sure that handrails are as long as the stairways. ?Check any carpeting to make sure that it  is firmly attached to the stairs. Fix any carpet that is loose or worn. ?Avoid having throw rugs at the top or bottom of the stairs. If you do have throw rugs, attach them to the floor with carpet tape. ?Make sure that you have a light switch at the top of the stairs and the bottom of the stairs. If you do not have them, ask someone to add them for you. ?What else can I do to help prevent falls? ?Wear shoes that: ?Do not have high heels. ?Have rubber bottoms. ?Are comfortable and fit you well. ?Are closed at the toe. Do not wear sandals. ?If you use a stepladder: ?Make sure that it is fully opened. Do not climb a closed stepladder. ?Make sure that both sides of the stepladder  are locked into place. ?Ask someone to hold it for you, if possible. ?Clearly mark and make sure that you can see: ?Any grab bars or handrails. ?First and last steps. ?Where the edge of each step is. ?Use tools that he

## 2021-10-15 NOTE — Progress Notes (Signed)
? ?Subjective:  ? David Mccullough is a 76 y.o. male who presents for Medicare Annual/Subsequent preventive examination. ? ?Virtual Visit via Telephone Note ? ?I connected with  David Mccullough on 10/15/21 at  9:30 AM EDT by telephone and verified that I am speaking with the correct person using two identifiers. ? ?Location: ?Patient: home ?Provider: [facility] [place] ?Persons participating in the virtual visit: patient/Nurse Health Advisor ?  ?I discussed the limitations, risks, security and privacy concerns of performing an evaluation and management service by telephone and the availability of in person appointments. The patient expressed understanding and agreed to proceed. ? ?Interactive audio and video telecommunications were attempted between this nurse and patient, however failed, due to patient having technical difficulties OR patient did not have access to video capability.  We continued and completed visit with audio only. ? ?Some vital signs may be absent or patient reported.  ? ?Clemetine Marker, LPN ? ? ?Review of Systems    ? ?Cardiac Risk Factors include: advanced age (>76mn, >>11women);dyslipidemia;male gender ? ?   ?Objective:  ?  ?There were no vitals filed for this visit. ?There is no height or weight on file to calculate BMI. ? ? ?  10/15/2021  ?  9:45 AM 09/08/2021  ?  7:46 AM 08/19/2021  ? 11:32 AM 07/28/2021  ?  8:45 AM 07/18/2021  ? 10:52 AM 06/26/2021  ?  9:11 AM 10/09/2020  ?  8:51 AM  ?Advanced Directives  ?Does Patient Have a Medical Advance Directive? Yes Yes Yes Yes Yes No;Yes Yes  ?Type of AParamedicof AOppLiving will H[place]Living will HParadiseLiving will HAlpineLiving will  HSisquocLiving will HGarden ValleyLiving will  ?Does patient want to make changes to medical advance directive?  No - Guardian declined No - Patient declined No - Patient declined  No - Patient declined   ?Copy  of [place] Chart? No - copy requested No - copy requested No - copy requested No - copy requested  No - copy requested No - copy requested  ?Would patient like information on creating a medical advance directive?  No - Patient declined No - Patient declined   No - Patient declined   ? ? ?Current Medications (verified) ?Outpatient Encounter Medications as of 10/15/2021  ?Medication Sig  ? aspirin EC 81 MG tablet Take 81 mg by mouth daily. Swallow whole.  ? atorvastatin (LIPITOR) 80 MG tablet TAKE 1 TABLET(80 MG) BY MOUTH DAILY AT 6 PM  ? calcium carbonate (TUMS - DOSED IN MG ELEMENTAL CALCIUM) 500 MG chewable tablet Chew 1 tablet by mouth as needed for indigestion or heartburn.  ? docusate sodium (COLACE) 100 MG capsule Take 1 capsule (100 mg total) by mouth 2 (two) times daily.  ? finasteride (PROSCAR) 5 MG tablet Take 1 tablet (5 mg total) by mouth daily. (Patient taking differently: Take 5 mg by mouth every morning.)  ? Multiple Vitamins-Minerals (MULTIVITAMIN WITH MINERALS) tablet Take 1 tablet by mouth daily.  ? Omega-3 Fatty Acids (FISH OIL PO) Take 1 tablet by mouth daily.  ? tamsulosin (FLOMAX) 0.4 MG CAPS capsule Take 1 capsule (0.4 mg total) by mouth daily. (Patient taking differently: Take 0.4 mg by mouth daily after supper.)  ? traZODone (DESYREL) 50 MG tablet TAKE 1 TABLET(50 MG) BY MOUTH AT BEDTIME  ? Vibegron (GEMTESA) 75 MG TABS Take 75 mg by mouth daily.  ? ?No facility-administered  encounter medications on file as of 10/15/2021.  ? ? ?Allergies (verified) ?Patient has no known allergies.  ? ?History: ?Past Medical History:  ?Diagnosis Date  ? Aortic atherosclerosis ([place])   ? Bladder tumor 07/03/2021  ? a.) cystoscopy revealed 1.5 cm relatively flat papillary tumor lateral to UO (LEFT); felt to be consistent with TCC  ? CAD (coronary artery disease) 03/15/2018  ? a.) NSTEMI 03/14/2018 --> LHC --> EF 50% with inferior HK; 80% pLAD, 99% OM1, 100% pRCA --> failed PCI (TIMI-1  reduced to TIMI-0); transferred to [facility] and underwent 3v CABG (LIMA-LAD, SVG-OM, SVG-RI) on 03/22/2018.  ? Colon polyps   ? Depression   ? Elevated PSA   ? Enlarged prostate   ? GERD (gastroesophageal reflux disease)   ? Hyperlipidemia   ? Hypertension   ? Nephrolithiasis   ? NSTEMI (non-ST elevated myocardial infarction) ([place]) 03/14/2018  ? a.) LHC 03/15/2018 --> 80% pLAD, 99% OM1, 100% pRCA --> failed PCI and Tx'd to [facility] for [facility] consult; underwent 3v CABG (LIMA-LAD, SVG-OM, SVG-RI) on 03/25/2018.  ? Persistent atrial fibrillation ([facility])   ? a.) CHA2DS2-VASc = 4 (age x2, HTN, prior MI). b.) rate/rhythm maintained without pharmacological intervention; no daily anticoagulation.  ? Renal cell cancer, left ([place]) 05/27/2021  ? a.) CT urogram --> 4.1 x 3.6 x 4.7 cm mass at LEFT kidney pole. b.) CNB (+) for RCC, conventional clear cell type, nuclear grade II. 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 d.) PET CT 07/31/2021 revealed FDG avid LEFT renal mass (SUV 4.12); scheduled for nephrectomy.  ? Right lower lobe pulmonary nodule 05/27/2021  ? a.) CT chest --> measured 9 mm.  ? 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 a.) 3v CABG: LIMA-LAD, SVG-OM, SVG-RI  ? SVT (supraventricular tachycardia) ([place]) 2009  ? a.) s/p SVT ablation while living in [place]  ? ?Past Surgical History:  ?Procedure Laterality Date  ? COLONOSCOPY    ? CORONARY ARTERY BYPASS GRAFT N/A 03/22/2018  ? Procedure: CORONARY ARTERY BYPASS GRAFTING (CABG) x 3 WITH ENDOSCOPIC HARVESTING OF RIGHT GREATER SAPHENOUS VEIN;  Surgeon: Ivin Poot, MD;  Location: [place];  Service: Open Heart Surgery;  Laterality: N/A;  ? CORONARY STENT INTERVENTION N/A 03/16/2018  ? Procedure: CORONARY STENT INTERVENTION;  Surgeon: Yolonda Kida, MD;  Location: [place] [facility];  Service: Cardiovascular;  Laterality: N/A;  ? LAPAROSCOPIC NEPHRECTOMY, HAND ASSISTED Left 09/08/2021  ?  Procedure: HAND ASSISTED LAPAROSCOPIC NEPHRECTOMY;  Surgeon: Hollice Espy, MD;  Location: [facility];  Service: Urology;  Laterality: Left;  ? LEFT ATRIAL APPENDAGE OCCLUSION N/A 03/22/2018  ? Procedure: LEFT ATRIAL APPENDAGE OCCLUSION USING 40 MM ATRICURE ATRICLIP;  Surgeon: Ivin Poot, MD;  Location: [place];  Service: Open Heart Surgery;  Laterality: N/A;  ? LEFT HEART CATH AND CORONARY ANGIOGRAPHY N/A 03/15/2018  ? Procedure: LEFT HEART CATH AND CORONARY ANGIOGRAPHY and PCI stent;  Surgeon: Yolonda Kida, MD;  Location: [place] [facility];  Service: Cardiovascular;  Laterality: N/A;  ? SVT ABLATION N/A 2009  ? Procedure: SVT ABLATION; Location: performed while living in [place].  ? TEE WITHOUT CARDIOVERSION N/A 03/22/2018  ? Procedure: TRANSESOPHAGEAL ECHOCARDIOGRAM (TEE);  Surgeon: Prescott Gum, Collier Salina, MD;  Location: [place];  Service: Open Heart Surgery;  Laterality: N/A;  ? TRANSURETHRAL RESECTION OF BLADDER TUMOR N/A 07/28/2021  ? Procedure: TRANSURETHRAL RESECTION OF BLADDER TUMOR (TURBT)WITH  GEMCITABINE;  Surgeon: Hollice Espy, MD;  Location: [facility];  Service: Urology;  Laterality: N/A;  ? ?Family History  ?Problem Relation Age of Onset  ? Heart disease Mother   ? Heart disease Father   ? Arrhythmia Sister   ? Heart disease Sister   ? ?Social History  ? ?Socioeconomic History  ? Marital status: Married  ?  Spouse name: Not on file  ? Number of children: 3  ? Years of education: Not on file  ? Highest education level: Not on file  ?Occupational History  ? Not on file  ?Tobacco Use  ? Smoking status: Former  ?  Packs/day: 1.00  ?  Years: 25.00  ?  Pack years: 25.00  ?  Types: Cigarettes  ?  Quit date: 06/30/1987  ?  Years since quitting: 34.3  ?  Passive exposure: Never  ? Smokeless tobacco: Never  ?Vaping Use  ? Vaping Use: Never used  ?Substance and Sexual Activity  ? Alcohol use: Not Currently  ? Drug use: Not Currently  ? Sexual activity: Not Currently  ?Other Topics Concern  ? Not on file   ?Social History Narrative  ? Not on file  ? ?Social Determinants of Health  ? ?Financial Resource Strain: Low Risk   ? Difficulty of Paying Living Expenses: Not hard at all  ?Food Insecurity: No Food Insecurit

## 2021-10-31 DIAGNOSIS — H2513 Age-related nuclear cataract, bilateral: Secondary | ICD-10-CM | POA: Diagnosis not present

## 2021-11-06 ENCOUNTER — Other Ambulatory Visit: Payer: Self-pay

## 2021-11-06 DIAGNOSIS — C679 Malignant neoplasm of bladder, unspecified: Secondary | ICD-10-CM

## 2021-11-07 ENCOUNTER — Encounter: Payer: Self-pay | Admitting: Urology

## 2021-11-07 ENCOUNTER — Other Ambulatory Visit
Admission: RE | Admit: 2021-11-07 | Discharge: 2021-11-07 | Disposition: A | Discharge status: Home / Self Care | Payer: Medicare Other | Attending: Urology | Admitting: Urology

## 2021-11-07 ENCOUNTER — Ambulatory Visit (INDEPENDENT_AMBULATORY_CARE_PROVIDER_SITE_OTHER): Payer: Medicare Other | Admitting: Urology

## 2021-11-07 VITALS — BP 137/74 | HR 80 | Ht 72.0 in | Wt 210.0 lb

## 2021-11-07 DIAGNOSIS — C679 Malignant neoplasm of bladder, unspecified: Secondary | ICD-10-CM

## 2021-11-07 DIAGNOSIS — Z85528 Personal history of other malignant neoplasm of kidney: Secondary | ICD-10-CM | POA: Diagnosis not present

## 2021-11-07 DIAGNOSIS — N401 Enlarged prostate with lower urinary tract symptoms: Secondary | ICD-10-CM

## 2021-11-07 DIAGNOSIS — Z8551 Personal history of malignant neoplasm of bladder: Secondary | ICD-10-CM

## 2021-11-07 DIAGNOSIS — N138 Other obstructive and reflux uropathy: Secondary | ICD-10-CM

## 2021-11-07 DIAGNOSIS — C642 Malignant neoplasm of left kidney, except renal pelvis: Secondary | ICD-10-CM

## 2021-11-07 LAB — URINALYSIS, COMPLETE (UACMP) WITH MICROSCOPIC
Bilirubin Urine: NEGATIVE
Glucose, UA: NEGATIVE mg/dL
Hgb urine dipstick: NEGATIVE
Ketones, ur: NEGATIVE mg/dL
Leukocytes,Ua: NEGATIVE
Nitrite: NEGATIVE
Protein, ur: NEGATIVE mg/dL
Specific Gravity, Urine: 1.01 (ref 1.005–1.030)
pH: 7 (ref 5.0–8.0)

## 2021-11-07 NOTE — Progress Notes (Signed)
? ?  11/07/21 ?CC:  ?Chief Complaint  ?Patient presents with  ? Cysto  ? ? ? ? ?HPI: ?David Mccullough is a 76 y.o. male  with a personal history of renal cell carcinoma of left kidney, malignant neoplasm of urinary bladder,urinary urgency, incomplete bladder emptying, elevated PSA, microscopic hematuria, and pulmonary nodule. He presents today for a cystostopy.  ? ?Renal biopsy on 06/26/2021 surgical pathology revealed renal cell carcinoma, conventional clear-cell type, nuclear grade 2. ?  ?He is s/p TURBT with instillation of intravesical chemotherapy on 07/28/2021.Intraoperative findings: Flat broad-based superficial appearing 1.5 cm papillary left posterior bladder wall tumor consistent with transitional cell carcinoma.  No other lesions in the bladder identified.  Mildly trabeculated bladder. ?  ?Surgical pathology of bladder specimen consistent with  non-invasive papillary urothelial carcinoma, low grade. ?  ?He is s/p hand-assisted laparoscopic left radical nephrectomy on 09/08/2021.   ? 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 [id number]. ? ?UA was unremarkable today.  ? ?Vitals:  ? 11/07/21 1106  ?BP: 137/74  ?Pulse: 80  ? ?NED. A&Ox3.   ?No respiratory distress   ?Abd soft, NT, ND ?Normal phallus with bilateral descended testicles ? ?Cystoscopy Procedure Note ? ?Patient identification was confirmed, informed consent was obtained, and patient was prepped using Betadine solution.  Lidocaine jelly was administered per urethral meatus.   ? ? ?Pre-Procedure: ?- Inspection reveals a normal caliber ureteral meatus. ? ?Procedure: ?The flexible cystoscope was introduced without difficulty ?- No urethral strictures/lesions are present. ?- Enlarged prostate bilobar coaptation  ?- Normal bladder neck ?- Bilateral ureteral orifices identified ?- Bladder mucosa  reveals no ulcers, tumors, or lesions ?- No bladder stones ?- No trabeculation ?- Previous TURBT sites appreciated no  well-healed scars ? ? ?Retroflexion shows ned  ? ? ?Post-Procedure: ?- Patient tolerated the procedure well ? ? ?Assessment/ Plan: ?Bladder cancer TCC  ?- low grade  ?- S/p TURBT ?- NED ?- Will continue cytoscopy with q6  ? ?2. Renal cell carcinoma  ?- Will undergo surveillance imaging with Dr Grayland Ormond at 6 month interval    ? ?3. BPH with outlet obstruction  ?- Continue current medication will consider discussion of outlet procedure in the future deferred in light of other comorbidity /multiple recent surgeries ? ?Follow-up cystoscopy in 3 months ? ?I,Kailey Littlejohn,acting as a scribe for Hollice Espy, MD.,have documented all relevant documentation on the behalf of Hollice Espy, MD,as directed by  Hollice Espy, MD while in the presence of Hollice Espy, MD. ? ?I have reviewed the above documentation for accuracy and completeness, and I agree with the above.  ? ?Hollice Espy, MD ? ? ?

## 2021-11-27 DIAGNOSIS — N1832 Chronic kidney disease, stage 3b: Secondary | ICD-10-CM | POA: Diagnosis not present

## 2021-11-27 DIAGNOSIS — D631 Anemia in chronic kidney disease: Secondary | ICD-10-CM | POA: Diagnosis not present

## 2021-11-28 ENCOUNTER — Other Ambulatory Visit: Payer: Self-pay | Admitting: Nephrology

## 2021-11-28 DIAGNOSIS — N1832 Chronic kidney disease, stage 3b: Secondary | ICD-10-CM

## 2021-12-10 ENCOUNTER — Ambulatory Visit
Admission: RE | Admit: 2021-12-10 | Discharge: 2021-12-10 | Disposition: A | Discharge status: Home / Self Care | Payer: Medicare Other | Source: Ambulatory Visit | Attending: Nephrology | Admitting: Nephrology

## 2021-12-10 DIAGNOSIS — N189 Chronic kidney disease, unspecified: Secondary | ICD-10-CM | POA: Diagnosis not present

## 2021-12-10 DIAGNOSIS — Z905 Acquired absence of kidney: Secondary | ICD-10-CM | POA: Diagnosis not present

## 2021-12-10 DIAGNOSIS — N1832 Chronic kidney disease, stage 3b: Secondary | ICD-10-CM | POA: Insufficient documentation

## 2021-12-10 DIAGNOSIS — N400 Enlarged prostate without lower urinary tract symptoms: Secondary | ICD-10-CM | POA: Diagnosis not present

## 2022-01-06 DIAGNOSIS — D631 Anemia in chronic kidney disease: Secondary | ICD-10-CM | POA: Diagnosis not present

## 2022-01-06 DIAGNOSIS — N1832 Chronic kidney disease, stage 3b: Secondary | ICD-10-CM | POA: Diagnosis not present

## 2022-02-05 ENCOUNTER — Ambulatory Visit: Payer: Medicare Other | Admitting: Family Medicine

## 2022-02-11 ENCOUNTER — Other Ambulatory Visit: Payer: Self-pay | Admitting: Family Medicine

## 2022-02-11 DIAGNOSIS — G4700 Insomnia, unspecified: Secondary | ICD-10-CM

## 2022-02-12 NOTE — Telephone Encounter (Signed)
Requested Prescriptions  Pending Prescriptions Disp Refills  . traZODone (DESYREL) 50 MG tablet [Pharmacy Med Name: TRAZODONE '50MG'$  TABLETS] 90 tablet 0    Sig: TAKE 1 TABLET(50 MG) BY MOUTH AT BEDTIME     Psychiatry: Antidepressants - Serotonin Modulator Passed - 02/11/2022  1:47 PM      Passed - Valid encounter within last 6 months    Recent Outpatient Visits          6 months ago Insomnia, unspecified type   [facility] [place], Deanna C, MD   1 year ago Insomnia, unspecified type   [facility] [place], Deanna C, MD   2 years ago Insomnia, unspecified type   [facility] [place], Deanna C, MD   3 years ago Insomnia, unspecified type   [facility] [place], Deanna C, MD   3 years ago Elevated PSA   [facility] at [place], [place], MD      Future Appointments            Tomorrow Juline Patch, MD [place] [facility] at [facility]

## 2022-02-13 ENCOUNTER — Encounter: Payer: Self-pay | Admitting: Family Medicine

## 2022-02-13 ENCOUNTER — Ambulatory Visit (INDEPENDENT_AMBULATORY_CARE_PROVIDER_SITE_OTHER): Payer: Medicare Other | Admitting: Family Medicine

## 2022-02-13 ENCOUNTER — Ambulatory Visit
Admission: RE | Admit: 2022-02-13 | Discharge: 2022-02-13 | Disposition: A | Discharge status: Home / Self Care | Payer: Medicare Other | Source: Ambulatory Visit | Attending: Oncology | Admitting: Oncology

## 2022-02-13 VITALS — BP 120/60 | HR 80 | Ht 72.0 in | Wt 209.0 lb

## 2022-02-13 DIAGNOSIS — N400 Enlarged prostate without lower urinary tract symptoms: Secondary | ICD-10-CM | POA: Diagnosis not present

## 2022-02-13 DIAGNOSIS — G4700 Insomnia, unspecified: Secondary | ICD-10-CM | POA: Diagnosis not present

## 2022-02-13 DIAGNOSIS — C642 Malignant neoplasm of left kidney, except renal pelvis: Secondary | ICD-10-CM | POA: Insufficient documentation

## 2022-02-13 DIAGNOSIS — I2510 Atherosclerotic heart disease of native coronary artery without angina pectoris: Secondary | ICD-10-CM | POA: Diagnosis not present

## 2022-02-13 DIAGNOSIS — K8020 Calculus of gallbladder without cholecystitis without obstruction: Secondary | ICD-10-CM | POA: Diagnosis not present

## 2022-02-13 DIAGNOSIS — I700 Atherosclerosis of aorta: Secondary | ICD-10-CM | POA: Diagnosis not present

## 2022-02-13 DIAGNOSIS — R918 Other nonspecific abnormal finding of lung field: Secondary | ICD-10-CM | POA: Diagnosis not present

## 2022-02-13 DIAGNOSIS — E785 Hyperlipidemia, unspecified: Secondary | ICD-10-CM | POA: Diagnosis not present

## 2022-02-13 LAB — POCT I-STAT CREATININE: Creatinine, Ser: 2 mg/dL — ABNORMAL HIGH (ref 0.61–1.24)

## 2022-02-13 MED ORDER — TRAZODONE HCL 50 MG PO TABS: ORAL_TABLET | ORAL | 1 refills | Status: DC

## 2022-02-13 MED ORDER — IOHEXOL 300 MG/ML  SOLN
100.0000 mL | Freq: Once | INTRAMUSCULAR | Status: AC | PRN
  Administered 2022-02-13: 75 mL via INTRAVENOUS

## 2022-02-13 MED ORDER — ATORVASTATIN CALCIUM 80 MG PO TABS: ORAL_TABLET | ORAL | 1 refills | Status: DC

## 2022-02-13 NOTE — Progress Notes (Signed)
Date:  02/13/2022   Name:  David Mccullough   DOB:  [birth year]   MRN:  [id number]   Chief Complaint: Insomnia (2 and 3)  Insomnia Primary symptoms: fragmented sleep, difficulty falling asleep.   The current episode started more than one year. The onset quality is sudden. The problem occurs intermittently. The problem has been waxing and waning since onset. The treatment provided mild relief.    Lab Results  Component Value Date   NA 143 10/14/2021   K 4.5 10/14/2021   CO2 23 10/14/2021   GLUCOSE 82 10/14/2021   BUN 22 10/14/2021   CREATININE 2.00 (H) 02/13/2022   CALCIUM 9.9 10/14/2021   EGFR 40 (L) 10/14/2021   GFRNONAA 42 (L) 09/09/2021   Lab Results  Component Value Date   CHOL 117 03/15/2019   HDL 36 (L) 03/15/2019   LDLCALC 62 03/15/2019   TRIG 100 03/15/2019   CHOLHDL 4.4 03/17/2018   Lab Results  Component Value Date   TSH 2.360 01/17/2019   Lab Results  Component Value Date   HGBA1C 5.2 03/17/2018   Lab Results  Component Value Date   WBC 11.6 (H) 09/09/2021   HGB 12.6 (L) 09/09/2021   HCT 37.5 (L) 09/09/2021   MCV 84.7 09/09/2021   PLT 133 (L) 09/09/2021   Lab Results  Component Value Date   ALT 16 03/15/2019   AST 18 03/15/2019   ALKPHOS 59 03/15/2019   BILITOT 0.6 03/15/2019   No results found for: "25OHVITD2", "25OHVITD3", "VD25OH"   Review of Systems  Constitutional:  Negative for chills and fatigue.  HENT:  Negative for sore throat.   Eyes:  Negative for visual disturbance.  Respiratory:  Negative for cough, shortness of breath and wheezing.   Cardiovascular:  Negative for chest pain and leg swelling.  Gastrointestinal:  Negative for abdominal pain, blood in stool, constipation, diarrhea and nausea.  Endocrine: Negative for polydipsia and polyuria.  Genitourinary:  Negative for dysuria, frequency, hematuria and urgency.  Musculoskeletal:  Negative for back pain, myalgias and neck pain.  Skin:  Negative for rash.  Allergic/Immunologic:  Negative for environmental allergies.  Neurological:  Negative for dizziness and headaches.  Hematological:  Does not bruise/bleed easily.  Psychiatric/Behavioral:  Negative for suicidal ideas. The patient has insomnia. The patient is not nervous/anxious.     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   NSTEMI (non-ST elevated myocardial infarction) ([place]) 03/14/2018    No Known Allergies  Past Surgical History:  Procedure Laterality Date   COLONOSCOPY     CORONARY ARTERY BYPASS GRAFT N/A 03/22/2018   Procedure: CORONARY ARTERY BYPASS GRAFTING (CABG) x 3 WITH ENDOSCOPIC HARVESTING OF RIGHT GREATER SAPHENOUS VEIN;  Surgeon: Ivin Poot, MD;  Location: [facility];  Service: Open Heart Surgery;  Laterality: N/A;   CORONARY STENT INTERVENTION N/A 03/16/2018   Procedure: CORONARY STENT INTERVENTION;  Surgeon: Yolonda Kida, MD;  Location: [facility];  Service: Cardiovascular;  Laterality: N/A;   LAPAROSCOPIC NEPHRECTOMY, HAND ASSISTED Left 09/08/2021   Procedure: HAND ASSISTED LAPAROSCOPIC NEPHRECTOMY;  Surgeon: Hollice Espy, MD;  Location: [facility];  Service: Urology;  Laterality: Left;   LEFT ATRIAL APPENDAGE OCCLUSION N/A 03/22/2018   Procedure: LEFT ATRIAL APPENDAGE OCCLUSION USING 40 MM ATRICURE ATRICLIP;  Surgeon: Ivin Poot, MD;  [facility];  Service: Open Heart Surgery;  Laterality: N/A;   LEFT HEART CATH AND CORONARY ANGIOGRAPHY N/A 03/15/2018   Procedure: LEFT HEART CATH AND CORONARY ANGIOGRAPHY and PCI stent;  Surgeon: Yolonda Kida, MD;  Location: [facility];  Service: Cardiovascular;  Laterality: N/A;   SVT ABLATION N/A 2009   Procedure: SVT ABLATION; Location: performed while living in [place].   TEE WITHOUT CARDIOVERSION N/A 03/22/2018   Procedure: TRANSESOPHAGEAL  ECHOCARDIOGRAM (TEE);  Surgeon: Prescott Gum, Collier Salina, MD;  Location: [facility];  Service: Open Heart Surgery;  Laterality: N/A;   TRANSURETHRAL RESECTION OF BLADDER TUMOR N/A 07/28/2021   Procedure: TRANSURETHRAL RESECTION OF BLADDER TUMOR (TURBT)WITH  GEMCITABINE;  Surgeon: Hollice Espy, MD;  Location: [facility];  Service: Urology;  Laterality: N/A;    Social History   Tobacco Use   Smoking status: Former    Packs/day: 1.00    Years: 25.00    Total pack years: 25.00    Types: Cigarettes    Quit date: 06/30/1987    Years since quitting: 34.6    Passive exposure: Never   Smokeless tobacco: Never  Vaping Use   Vaping Use: Never used  Substance Use Topics   Alcohol use: Not Currently   Drug use: Not Currently     Medication list has been reviewed and updated.  Current Meds  Medication Sig   aspirin EC 81 MG tablet Take 81 mg by mouth daily. Swallow whole.   atorvastatin (LIPITOR) 80 MG tablet TAKE 1 TABLET(80 MG) BY MOUTH DAILY AT 6 PM   calcium carbonate (TUMS - DOSED IN MG ELEMENTAL CALCIUM) 500 MG chewable tablet Chew 1 tablet by mouth as needed for indigestion or heartburn.   docusate sodium (COLACE) 100 MG capsule Take 1 capsule (100 mg total) by mouth 2 (two) times daily.   finasteride (PROSCAR) 5 MG tablet Take 1 tablet (5 mg total) by mouth daily.   Multiple Vitamins-Minerals (MULTIVITAMIN WITH MINERALS) tablet Take 1 tablet by mouth daily.   Omega-3 Fatty Acids (FISH OIL PO) Take 1 tablet by mouth daily.   tamsulosin (FLOMAX) 0.4 MG CAPS capsule Take 1 capsule (0.4 mg total) by mouth daily.   traZODone (DESYREL) 50 MG tablet TAKE 1 TABLET(50 MG) BY MOUTH AT BEDTIME   Vibegron (GEMTESA) 75 MG TABS Take 75 mg by mouth daily.       02/13/2022    2:39 PM 08/07/2021    4:09 PM 08/14/2020    9:27 AM  GAD 7 : Generalized Anxiety Score  Nervous, Anxious, on Edge 0 0 0  Control/stop worrying 1 0 0  Worry too much - different things 1 0 2  Trouble relaxing 0 0 0  Restless 0 0 0   Easily annoyed or irritable 0 0 0  Afraid - awful might happen 1 0 0  Total GAD 7 Score 3 0 2  Anxiety Difficulty Not difficult at all Not difficult at all Not difficult at all       02/13/2022    2:38 PM 10/15/2021    9:43 AM 08/07/2021    4:09 PM  Depression screen PHQ 2/9  Decreased Interest 0 1 0  Down, Depressed, Hopeless 1 1 0  PHQ - 2 Score 1 2 0  Altered sleeping 1 1 0  Tired, decreased energy 0 1 0  Change in appetite 0 0 0  Feeling bad or failure about yourself  0 0 0  Trouble concentrating 0 0 0  Moving  slowly or fidgety/restless 0 1 0  Suicidal thoughts 0 0 0  PHQ-9 Score 2 5 0  Difficult doing work/chores Not difficult at all Not difficult at all Not difficult at all    BP Readings from Last 3 Encounters:  02/13/22 120/60  11/07/21 137/74  10/14/21 (!) 154/80    Physical Exam Vitals and nursing note reviewed.  HENT:     Head: Normocephalic.     Right Ear: External ear normal.     Left Ear: External ear normal.     Nose: Nose normal.  Eyes:     General: No scleral icterus.       Right eye: No discharge.        Left eye: No discharge.     Conjunctiva/sclera: Conjunctivae normal.     Pupils: Pupils are equal, round, and reactive to light.  Neck:     Thyroid: No thyromegaly.     Vascular: No JVD.     Trachea: No tracheal deviation.  Cardiovascular:     Rate and Rhythm: Normal rate and regular rhythm.     Heart sounds: Normal heart sounds. No murmur heard.    No friction rub. No gallop.  Pulmonary:     Effort: No respiratory distress.     Breath sounds: Normal breath sounds. No wheezing or rales.  Abdominal:     General: Bowel sounds are normal.     Palpations: Abdomen is soft. There is no mass.     Tenderness: There is no abdominal tenderness. There is no guarding or rebound.  Musculoskeletal:        General: No tenderness. Normal range of motion.     Cervical back: Normal range of motion and neck supple.  Lymphadenopathy:     Cervical: No  cervical adenopathy.  Skin:    General: Skin is warm.     Findings: No rash.  Neurological:     Mental Status: He is alert and oriented to person, place, and time.     Cranial Nerves: No cranial nerve deficit.     Motor: No weakness.     Deep Tendon Reflexes: Reflexes are normal and symmetric.     Wt Readings from Last 3 Encounters:  02/13/22 209 lb (94.8 kg)  11/07/21 210 lb (95.3 kg)  10/14/21 210 lb (95.3 kg)    BP 120/60   Pulse 80   Ht 6' (1.829 m)   Wt 209 lb (94.8 kg)   BMI 28.35 kg/m   Assessment and Plan: 1. Insomnia, unspecified type Chronic.  Controlled.  Stable.  Only problem is that patient is having awakening after 6 hours and then difficulty resuming sleep.  He awakenings probably good because there is some issues with his prostate that he needs to empty his bladder at about that time therefore random intake and a whole pill at the beginning will take a half and then after he wakes up and uses the restroom when he takes the second half if this is not sufficient then will take a whole 1 at the beginning and a half when he wakes up after he empties his bladder.  Therefore we will continue trazodone 50 mg 1/2-1 as needed. - traZODone (DESYREL) 50 MG tablet; Take 1 tablet by mouth daily  Dispense: 90 tablet; Refill: 1  2. Dyslipidemia Chronic.  Controlled.  Stable.  Currently with excellent LDL and VLDL triglycerides there is a mild decrease in the HDL and we will continue with current dosing of atorvastatin 80 mg  once a day. - Lipid Panel With LDL/HDL Ratio     Otilio Miu, MD

## 2022-02-18 ENCOUNTER — Ambulatory Visit: Payer: Medicare Other | Admitting: Oncology

## 2022-02-24 NOTE — Progress Notes (Signed)
[facility]  [phone] [phone]) [phone]  ID: David Mccullough OB: Jan 25, 1946  MR#: [id number]  VEL#:[id number]  Patient Care Team: Juline Patch, MD as PCP - General (Family Medicine) Hollice Espy, MD as Consulting Physician (Urology) Yolonda Kida, MD as Consulting Physician (Cardiology) Lloyd Huger, MD as Consulting Physician (Oncology)  CHIEF COMPLAINT: Stage I left renal cell carcinoma.  INTERVAL HISTORY: Patient returns to clinic today for further evaluation and discussion of his imaging results.  He currently feels well and is asymptomatic. He has no neurologic complaints.  He denies any recent fevers or illnesses.  He has a good appetite and denies weight loss.  He has no chest pain, shortness of breath, cough, or hemoptysis.  He denies any nausea, vomiting, constipation, or diarrhea.  He has no urinary complaints.  Patient offers no specific complaints today.  REVIEW OF SYSTEMS:   Review of Systems  Constitutional: Negative.  Negative for fever, malaise/fatigue and weight loss.  Respiratory: Negative.  Negative for cough, hemoptysis and shortness of breath.   Cardiovascular: Negative.  Negative for chest pain and leg swelling.  Gastrointestinal: Negative.  Negative for abdominal pain.  Genitourinary: Negative.  Negative for dysuria.  Musculoskeletal: Negative.  Negative for back pain.  Skin: Negative.  Negative for rash.  Neurological: Negative.  Negative for dizziness, focal weakness, weakness and headaches.  Psychiatric/Behavioral: Negative.  The patient is not nervous/anxious.     As per HPI. Otherwise, a complete review of systems is negative.  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   Colon polyps    Depression    Elevated PSA    Enlarged prostate    GERD (gastroesophageal reflux disease)    Hyperlipidemia    Hypertension    Nephrolithiasis    NSTEMI (non-ST elevated myocardial infarction) ([place]) 03/14/2018   a.) LHC 03/15/2018 --> 80% pLAD, 99% OM1, 100% pRCA --> failed PCI and Tx'd to [facility] for CVTS consult; underwent 3v CABG (LIMA-LAD, SVG-OM, SVG-RI) on 03/25/2018.   Persistent atrial fibrillation (HCC)    a.) CHA2DS2-VASc = 4 (age x2, HTN, prior MI). b.) rate/rhythm maintained without pharmacological intervention; no daily anticoagulation.   Renal cell cancer, left ([place]) 05/27/2021   a.) CT urogram --> 4.1 x 3.6 x 4.7 cm mass at LEFT kidney pole. b.) CNB (+) for RCC, conventional clear cell type, nuclear grade II. 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 d.) PET CT 07/31/2021 revealed FDG avid LEFT renal mass (SUV 4.12); scheduled for nephrectomy.   Right lower lobe pulmonary nodule 05/27/2021   a.) CT chest --> measured 9 mm.   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   a.) 3v CABG: LIMA-LAD, SVG-OM, SVG-RI   SVT (supraventricular tachycardia) ([place]) 2009   a.) s/p SVT ablation while living in [place]: Past Surgical History:  Procedure Laterality Date   COLONOSCOPY     CORONARY ARTERY BYPASS GRAFT N/A 03/22/2018   Procedure:  CORONARY ARTERY BYPASS GRAFTING (CABG) x 3 WITH ENDOSCOPIC HARVESTING OF RIGHT GREATER SAPHENOUS VEIN;  Surgeon: Ivin Poot, MD;  Location: [facility];  Service: Open Heart Surgery;  Laterality: N/A;   CORONARY STENT INTERVENTION N/A 03/16/2018   Procedure: CORONARY STENT INTERVENTION;  Surgeon: Yolonda Kida,  MD;  Location: [facility];  Service: Cardiovascular;  Laterality: N/A;   LAPAROSCOPIC NEPHRECTOMY, HAND ASSISTED Left 09/08/2021   Procedure: HAND ASSISTED LAPAROSCOPIC NEPHRECTOMY;  Surgeon: Hollice Espy, MD;  Location: [facility];  Service: Urology;  Laterality: Left;   LEFT ATRIAL APPENDAGE OCCLUSION N/A 03/22/2018   Procedure: LEFT ATRIAL APPENDAGE OCCLUSION USING 40 MM ATRICURE ATRICLIP;  Surgeon: Ivin Poot, MD;  Location: [facility];  Service: Open Heart Surgery;  Laterality: N/A;   LEFT HEART CATH AND CORONARY ANGIOGRAPHY N/A 03/15/2018   Procedure: LEFT HEART CATH AND CORONARY ANGIOGRAPHY and PCI stent;  Surgeon: Yolonda Kida, MD;  Location: [facility];  Service: Cardiovascular;  Laterality: N/A;   SVT ABLATION N/A 2009   Procedure: SVT ABLATION; Location: performed while living in [place].   TEE WITHOUT CARDIOVERSION N/A 03/22/2018   Procedure: TRANSESOPHAGEAL ECHOCARDIOGRAM (TEE);  Surgeon: Prescott Gum, Collier Salina, MD;  Location: [facility];  Service: Open Heart Surgery;  Laterality: N/A;   TRANSURETHRAL RESECTION OF BLADDER TUMOR N/A 07/28/2021   Procedure: TRANSURETHRAL RESECTION OF BLADDER TUMOR (TURBT)WITH  GEMCITABINE;  Surgeon: Hollice Espy, MD;  Location: [facility];  Service: Urology;  Laterality: N/A;    FAMILY HISTORY: Family History  Problem Relation Age of Onset   Heart disease Mother    Heart disease Father    Arrhythmia Sister    Heart disease Sister     ADVANCED DIRECTIVES (Y/N):  N  HEALTH MAINTENANCE: Social History   Tobacco Use   Smoking status: Former    Packs/day: 1.00    Years: 25.00    Total pack years: 25.00    Types: Cigarettes    Quit date: 06/30/1987    Years since quitting: 34.6    Passive exposure: Never   Smokeless tobacco: Never  Vaping Use   Vaping Use: Never used  Substance Use Topics   Alcohol use: Not Currently   Drug use: Not Currently     Colonoscopy:  PAP:  Bone density:  Lipid panel:  No Known  Allergies  Current Outpatient Medications  Medication Sig Dispense Refill   aspirin EC 81 MG tablet Take 81 mg by mouth daily. Swallow whole.     atorvastatin (LIPITOR) 80 MG tablet TAKE 1 TABLET(80 MG) BY MOUTH DAILY AT 6 PM 90 tablet 1   calcium carbonate (TUMS - DOSED IN MG ELEMENTAL CALCIUM) 500 MG chewable tablet Chew 1 tablet by mouth as needed for indigestion or heartburn.     docusate sodium (COLACE) 100 MG capsule Take 1 capsule (100 mg total) by mouth 2 (two) times daily. 60 capsule 0   finasteride (PROSCAR) 5 MG tablet Take 1 tablet (5 mg total) by mouth daily. 90 tablet 3   Multiple Vitamins-Minerals (MULTIVITAMIN WITH MINERALS) tablet Take 1 tablet by mouth daily.     Omega-3 Fatty Acids (FISH OIL PO) Take 1 tablet by mouth daily.     tamsulosin (FLOMAX) 0.4 MG CAPS capsule Take 1 capsule (0.4 mg total) by mouth daily. 90 capsule 3   traZODone (DESYREL) 50 MG tablet Take 1 tablet by mouth daily 90 tablet 1   Vibegron (GEMTESA) 75 MG TABS Take 75 mg by mouth daily.  30 tablet 11   No current facility-administered medications for this visit.    OBJECTIVE: Vitals:   02/25/22 1031  BP: 135/66  Pulse: 64  Temp: 97.8 F (36.6 C)     Body mass index is 27.94 kg/m.    ECOG FS:0 - Asymptomatic  General: Well-developed, well-nourished, no acute distress. Eyes: Pink conjunctiva, anicteric sclera. HEENT: Normocephalic, moist mucous membranes. Lungs: No audible wheezing or coughing. Heart: Regular rate and rhythm. Abdomen: Soft, nontender, no obvious distention. Musculoskeletal: No edema, cyanosis, or clubbing. Neuro: Alert, answering all questions appropriately. Cranial nerves grossly intact. Skin: No rashes or petechiae noted. Psych: Normal affect.   LAB RESULTS:  Lab Results  Component Value Date   NA 143 10/14/2021   K 4.5 10/14/2021   CL 104 10/14/2021   CO2 23 10/14/2021   GLUCOSE 82 10/14/2021   BUN 22 10/14/2021   CREATININE 2.00 (H) 02/13/2022   CALCIUM 9.9  10/14/2021   PROT 6.6 03/15/2019   ALBUMIN 4.4 03/15/2019   AST 18 03/15/2019   ALT 16 03/15/2019   ALKPHOS 59 03/15/2019   BILITOT 0.6 03/15/2019   GFRNONAA 42 (L) 09/09/2021   GFRAA >60 04/25/2018    Lab Results  Component Value Date   WBC 11.6 (H) 09/09/2021   NEUTROABS 4.0 06/26/2021   HGB 12.6 (L) 09/09/2021   HCT 37.5 (L) 09/09/2021   MCV 84.7 09/09/2021   PLT 133 (L) 09/09/2021     STUDIES: CT CHEST ABDOMEN PELVIS W CONTRAST  Result Date: 02/15/2022 CLINICAL DATA:  76 year old male with history of left-sided renal cell carcinoma status post left radical nephrectomy. Follow-up study. * Tracking Code: BO * EXAM: CT CHEST, ABDOMEN, AND PELVIS WITH CONTRAST TECHNIQUE: Multidetector CT imaging of the chest, abdomen and pelvis was performed following the standard protocol during bolus administration of intravenous contrast. RADIATION DOSE REDUCTION: This exam was performed according to the departmental dose-optimization program which includes automated exposure control, adjustment of the mA and/or kV according to patient size and/or use of iterative reconstruction technique. CONTRAST:  59m OMNIPAQUE IOHEXOL 300 MG/ML  SOLN COMPARISON:  PET-CT 07/31/2021. CT of the chest 06/13/2021. CT of the abdomen and pelvis 05/27/2021. FINDINGS: CT CHEST FINDINGS Cardiovascular: Heart size is enlarged with biatrial dilatation. There is no significant pericardial fluid, thickening or pericardial calcification. There is aortic atherosclerosis, as well as atherosclerosis of the great vessels of the mediastinum and the coronary arteries, including calcified atherosclerotic plaque in the left main, left anterior descending, left circumflex and right coronary arteries. Status post median sternotomy for CABG including LIMA to the LAD. Mediastinum/Nodes: No pathologically enlarged mediastinal or hilar lymph nodes. Esophagus is unremarkable in appearance. No axillary lymphadenopathy. Lungs/Pleura: Smoothly  marginated 9 x 5 mm (mean diameter of 7 mm) oval-shaped pulmonary nodule in the right lower lobe (axial image 107 of series 4), unchanged. No other new suspicious appearing pulmonary nodules or masses are noted. No acute consolidative airspace disease. No pleural effusions. Musculoskeletal: Status post median sternotomy. There are no aggressive appearing lytic or blastic lesions noted in the visualized portions of the skeleton. CT ABDOMEN PELVIS FINDINGS Hepatobiliary: No suspicious cystic or solid hepatic lesions. No intra or extrahepatic biliary ductal dilatation. Calcified gallstones lie dependently in the gallbladder. Gallbladder is not distended. No gallbladder wall thickening or pericholecystic fluid or surrounding inflammatory changes. Pancreas: No pancreatic mass. No pancreatic ductal dilatation. No pancreatic or peripancreatic fluid collections or inflammatory changes. Spleen: Unremarkable. Adrenals/Urinary Tract: Status post left radical nephrectomy. No unexpected soft tissue  mass noted in the nephrectomy bed to suggest residual or locally recurrent disease. Right kidney and bilateral adrenal glands are normal in appearance. No hydroureteronephrosis. Urinary bladder is nearly decompressed, but otherwise unremarkable in appearance. Stomach/Bowel: The appearance of the stomach is normal. There is no pathologic dilatation of small bowel or colon. The appendix is not confidently identified and may be surgically absent. Regardless, there are no inflammatory changes noted adjacent to the cecum to suggest the presence of an acute appendicitis at this time. Vascular/Lymphatic: Atherosclerosis throughout the abdominal and pelvic vasculature, without evidence of aneurysm or dissection. No lymphadenopathy noted in the abdomen or pelvis. Reproductive: Prostate gland is enlarged and heterogeneous in appearance measuring up to 5.9 x 5.3 cm. Seminal vesicles are unremarkable in appearance. Other: No significant volume of  ascites.  No pneumoperitoneum. Musculoskeletal: There are no aggressive appearing lytic or blastic lesions noted in the visualized portions of the skeleton. IMPRESSION: 1. Status post left radical nephrectomy. Previously noted smoothly marginated right lower lobe pulmonary nodule with a mean diameter of 7 mm is stable compared to prior chest CT 06/13/2021, potentially benign. Close attention on follow-up studies is recommended. No other new findings to suggest residual, locally recurrent or definite metastatic disease on today's examination. 2. Prostatomegaly. 3. Aortic atherosclerosis, in addition to left main and three-vessel coronary artery disease. Status post median sternotomy for CABG including LIMA to the LAD. 4. Additional incidental findings, as above. Electronically Signed   By: Vinnie Langton M.D.   On: 02/15/2022 08:17    ASSESSMENT: Stage I left renal cell carcinoma.  PLAN:    Stage I left renal cell carcinoma: Patient underwent left nephrectomy on September 08, 2021 confirming stage of disease.  Repeat CT scan on February 15, 2022 reviewed independently and reported as above with no obvious evidence of recurrent or progressive disease.  No intervention is needed at this time.  Return to clinic in 6 months with repeat imaging and further evaluation.  Continue follow-up with urology as scheduled. Right lower lobe pulmonary nodule: Previously, lesion was noted to be PET negative.  CT scan results as above with no significant change.  Will repeat chest CT in 6 months to assure stability, and then further imaging can be discontinued of the chest.     Patient expressed understanding and was in agreement with this plan. He also understands that He can call clinic at any time with any questions, concerns, or complaints.    Cancer Staging  Renal cell adenocarcinoma, left [facility]) Staging form: Kidney, AJCC 8th Edition - Clinical stage from 08/19/2021: Stage I (cT1b, cN0, cM0) - Signed by Lloyd Huger, MD on 08/19/2021 Stage prefix: Initial diagnosis   Lloyd Huger, MD   02/27/2022 1:21 PM

## 2022-02-25 ENCOUNTER — Inpatient Hospital Stay: Payer: Medicare Other | Attending: Oncology | Admitting: Oncology

## 2022-02-25 ENCOUNTER — Encounter: Payer: Self-pay | Admitting: Oncology

## 2022-02-25 VITALS — BP 135/66 | HR 64 | Temp 97.8°F | Wt 206.0 lb

## 2022-02-25 DIAGNOSIS — R911 Solitary pulmonary nodule: Secondary | ICD-10-CM | POA: Insufficient documentation

## 2022-02-25 DIAGNOSIS — C642 Malignant neoplasm of left kidney, except renal pelvis: Secondary | ICD-10-CM

## 2022-02-25 DIAGNOSIS — Z08 Encounter for follow-up examination after completed treatment for malignant neoplasm: Secondary | ICD-10-CM | POA: Diagnosis not present

## 2022-02-25 DIAGNOSIS — Z85528 Personal history of other malignant neoplasm of kidney: Secondary | ICD-10-CM | POA: Diagnosis not present

## 2022-04-22 DIAGNOSIS — E782 Mixed hyperlipidemia: Secondary | ICD-10-CM | POA: Diagnosis not present

## 2022-04-22 DIAGNOSIS — I10 Essential (primary) hypertension: Secondary | ICD-10-CM | POA: Diagnosis not present

## 2022-04-22 DIAGNOSIS — I2510 Atherosclerotic heart disease of native coronary artery without angina pectoris: Secondary | ICD-10-CM | POA: Diagnosis not present

## 2022-04-22 DIAGNOSIS — I214 Non-ST elevation (NSTEMI) myocardial infarction: Secondary | ICD-10-CM | POA: Diagnosis not present

## 2022-04-22 DIAGNOSIS — I480 Paroxysmal atrial fibrillation: Secondary | ICD-10-CM | POA: Diagnosis not present

## 2022-04-22 DIAGNOSIS — Z951 Presence of aortocoronary bypass graft: Secondary | ICD-10-CM | POA: Diagnosis not present

## 2022-05-01 ENCOUNTER — Ambulatory Visit: Payer: Medicare Other | Admitting: Urology

## 2022-05-12 DIAGNOSIS — D631 Anemia in chronic kidney disease: Secondary | ICD-10-CM | POA: Diagnosis not present

## 2022-05-12 DIAGNOSIS — N1832 Chronic kidney disease, stage 3b: Secondary | ICD-10-CM | POA: Diagnosis not present

## 2022-05-15 ENCOUNTER — Other Ambulatory Visit
Admission: RE | Admit: 2022-05-15 | Discharge: 2022-05-15 | Disposition: A | Discharge status: Home / Self Care | Payer: Medicare Other | Attending: Urology | Admitting: Urology

## 2022-05-15 ENCOUNTER — Ambulatory Visit (INDEPENDENT_AMBULATORY_CARE_PROVIDER_SITE_OTHER): Payer: Medicare Other | Admitting: Urology

## 2022-05-15 ENCOUNTER — Other Ambulatory Visit: Payer: Self-pay

## 2022-05-15 ENCOUNTER — Encounter: Payer: Self-pay | Admitting: Urology

## 2022-05-15 VITALS — BP 149/84 | HR 83 | Ht 72.0 in | Wt 200.0 lb

## 2022-05-15 DIAGNOSIS — Z8551 Personal history of malignant neoplasm of bladder: Secondary | ICD-10-CM

## 2022-05-15 LAB — URINALYSIS, COMPLETE (UACMP) WITH MICROSCOPIC
Bilirubin Urine: NEGATIVE
Glucose, UA: NEGATIVE mg/dL
Ketones, ur: NEGATIVE mg/dL
Leukocytes,Ua: NEGATIVE
Nitrite: NEGATIVE
Protein, ur: NEGATIVE mg/dL
Specific Gravity, Urine: 1.015 (ref 1.005–1.030)
pH: 7 (ref 5.0–8.0)

## 2022-05-15 MED ORDER — LIDOCAINE HCL URETHRAL/MUCOSAL 2 % EX GEL
1.0000 | Freq: Once | CUTANEOUS | Status: AC
  Administered 2022-05-15: 1 via URETHRAL

## 2022-05-15 NOTE — Progress Notes (Signed)
   05/15/22 CC:  Chief Complaint  Patient presents with   Cysto      HPI: David Mccullough is a 76 y.o. male  with a personal history of renal cell carcinoma of left kidney, malignant neoplasm of urinary bladder,urinary urgency, incomplete bladder emptying, elevated PSA, microscopic hematuria, and pulmonary nodule. He presents today for a cystostopy.   Renal biopsy on 06/26/2021 surgical pathology revealed renal cell carcinoma, conventional clear-cell type, nuclear grade 2.   He is s/p TURBT with instillation of intravesical chemotherapy on 07/28/2021.Intraoperative findings: Flat broad-based superficial appearing 1.5 cm papillary left posterior bladder wall tumor consistent with transitional cell carcinoma.  No other lesions in the bladder identified.  Mildly trabeculated bladder.   Surgical pathology of bladder specimen consistent with  non-invasive papillary urothelial carcinoma, low grade.   He is s/p hand-assisted laparoscopic left radical nephrectomy on 09/08/2021.    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 [id number].  He is being followed with serial imaging with Dr. Grayland Ormond.  CT from a few months ago was negative for any evidence of recurrence.  Nonspecific pulmonary nodule also being followed.  UA was unremarkable today.   Vitals:   05/15/22 1041  BP: (!) 149/84  Pulse: 83   NED. A&Ox3.   No respiratory distress   Abd soft, NT, ND Normal phallus with bilateral descended testicles  Cystoscopy Procedure Note  Patient identification was confirmed, informed consent was obtained, and patient was prepped using Betadine solution.  Lidocaine jelly was administered per urethral meatus.     Pre-Procedure: - Inspection reveals a normal caliber ureteral meatus.  Procedure: The flexible cystoscope was introduced without difficulty - No urethral strictures/lesions are present. - Enlarged prostate bilobar coaptation  - Normal bladder  neck - Bilateral ureteral orifices identified - Bladder mucosa  reveals no ulcers, tumors, or lesions - No bladder stones - No trabeculation - Previous TURBT sites appreciated no well-healed scars   Retroflexion shows ned    Post-Procedure: - Patient tolerated the procedure well   Assessment/ Plan: Bladder cancer TCC  - low grade  - S/p TURBT - NED - Will continue cytoscopy with q6   2. Renal cell carcinoma  - Will undergo surveillance imaging with Dr Grayland Ormond at 6 month interval     3. BPH with outlet obstruction  - Continue current medication will consider discussion of outlet procedure in the future deferred in light of other comorbidity /multiple recent surgeries  Follow-up cystoscopy in 6 months    Hollice Espy, MD

## 2022-06-11 ENCOUNTER — Other Ambulatory Visit: Payer: Self-pay | Admitting: Urology

## 2022-07-06 ENCOUNTER — Other Ambulatory Visit: Payer: Self-pay

## 2022-07-06 MED ORDER — FINASTERIDE 5 MG PO TABS: 5.0000 mg | ORAL_TABLET | Freq: Every day | ORAL | 1 refills | Status: DC

## 2022-07-16 ENCOUNTER — Telehealth: Payer: Self-pay | Admitting: *Deleted

## 2022-07-16 ENCOUNTER — Telehealth: Payer: Self-pay | Admitting: Family Medicine

## 2022-07-16 ENCOUNTER — Encounter: Payer: Self-pay | Admitting: *Deleted

## 2022-07-16 NOTE — Patient Outreach (Signed)
  Care Coordination   Initial Visit Note   07/16/2022 Name: Mccullough Mccullough MRN: [id number] DOB: [birth year]  Mccullough Mccullough is a 77 y.o. year old male who sees Mccullough Patch, MD for primary care. I spoke with  Mccullough Mccullough by phone today.  What matters to the patients health and wellness today?  Ongoing health management.  Working an part time job, will continue to do so.  Does not feel follow up is necessary at this time but will call with questions.     Goals Addressed             This Visit's Progress    COMPLETED: Care Coordination activities - No follow upneeded       Care Coordination Interventions: Patient interviewed about adult health maintenance status including  Regular eye checkups Regular Dental Care    Blood Pressure    Advised patient to discuss  Pneumonia Vaccine Influenza Vaccine COVID vaccination    with primary care provider  Provided education about overall health maintenance SDOH assessment completed PCP office visit on 2/20, AWV in April 2024         SDOH assessments and interventions completed:  Yes  SDOH Interventions Today    Flowsheet Row Most Recent Value  SDOH Interventions   Food Insecurity Interventions Intervention Not Indicated  Housing Interventions Intervention Not Indicated  Transportation Interventions Intervention Not Indicated        Care Coordination Interventions:  Yes, provided   Follow up plan: No further intervention required.   Encounter Outcome:  Pt. Visit Completed   Mccullough David, RN, MSN, [place] Care Management Care Management Coordinator [phone]

## 2022-07-16 NOTE — Patient Instructions (Signed)
Visit Information  Thank you for taking time to visit with me today. Please don't hesitate to contact me if I can be of assistance to you.  Following are the goals we discussed today:  Continue diet and exercise when weather permits.   Please call the Suicide and Crisis Lifeline: 988 call the [place] National Suicide Prevention Lifeline: 9718885930 or TTY: (314) 375-2200 TTY 502-103-5344) to talk to a trained counselor call 1-800-273-TALK (toll free, 24 hour hotline) call 911 if you are experiencing a Mental Health or [place] or need someone to talk to.  Patient verbalizes understanding of instructions and care plan provided today and agrees to view in [place]. Active MyChart status and patient understanding of how to access instructions and care plan via MyChart confirmed with patient.     The patient has been provided with contact information for the care management team and has been advised to call with any health related questions or concerns.   [name], RN, MSN, [place] Care Management Care Management Coordinator [phone]

## 2022-07-16 NOTE — Patient Outreach (Signed)
  Care Coordination   07/16/2022 Name: David Mccullough MRN: [id number] DOB: [birth year]   Care Coordination Outreach Attempts:  An unsuccessful telephone outreach was attempted today to offer the patient information about available care coordination services as a benefit of their health plan.   Follow Up Plan:  Additional outreach attempts will be made to offer the patient care coordination information and services.   Encounter Outcome:  No Answer   Care Coordination Interventions:  No, not indicated    [name], RN, MSN, [facility] Care Management Care Management Coordinator [phone]

## 2022-07-16 NOTE — Telephone Encounter (Signed)
Copied from [place] [id number]. Topic: General - Other >> Jul 16, 2022  3:00 PM Ja-Kwan M wrote: Reason for CRM: Pt returned call to the [facility]. Attempted to transfer pt but there was no answer. Pt requests call back.

## 2022-08-13 ENCOUNTER — Telehealth: Payer: Self-pay | Admitting: Family Medicine

## 2022-08-13 NOTE — Telephone Encounter (Signed)
Called pt spoke to his wife. Told her that labs will be done at his appointment on the 20th. She verbalized understanding.  KP

## 2022-08-13 NOTE — Telephone Encounter (Signed)
Copied from [place]. Topic: Appointment Scheduling - Scheduling Inquiry for Clinic >> Aug 13, 2022  1:10 PM David Mccullough wrote: Reason for CRM: Pt is requesting to schedule an appointment for labs to check cholesterol. Stated the cardiologist requested this be done before he went back to see him again.  Please advise.

## 2022-08-18 ENCOUNTER — Ambulatory Visit (INDEPENDENT_AMBULATORY_CARE_PROVIDER_SITE_OTHER): Payer: Medicare Other | Admitting: Family Medicine

## 2022-08-18 ENCOUNTER — Encounter: Payer: Self-pay | Admitting: Family Medicine

## 2022-08-18 ENCOUNTER — Other Ambulatory Visit
Admission: RE | Admit: 2022-08-18 | Discharge: 2022-08-18 | Disposition: A | Discharge status: Home / Self Care | Payer: Medicare Other | Attending: Family Medicine | Admitting: Family Medicine

## 2022-08-18 VITALS — BP 128/70 | HR 84 | Ht 72.0 in | Wt 214.0 lb

## 2022-08-18 DIAGNOSIS — Z1159 Encounter for screening for other viral diseases: Secondary | ICD-10-CM

## 2022-08-18 DIAGNOSIS — E785 Hyperlipidemia, unspecified: Secondary | ICD-10-CM | POA: Diagnosis not present

## 2022-08-18 DIAGNOSIS — G4700 Insomnia, unspecified: Secondary | ICD-10-CM

## 2022-08-18 LAB — LIPID PANEL
Cholesterol: 127 mg/dL (ref 0–200)
HDL: 34 mg/dL — ABNORMAL LOW (ref >40)
LDL Cholesterol: 76 mg/dL (ref 0–99)
Total CHOL/HDL Ratio: 3.7 RATIO
Triglycerides: 87 mg/dL (ref <150)
VLDL: 17 mg/dL (ref 0–40)

## 2022-08-18 LAB — COMPREHENSIVE METABOLIC PANEL
ALT: 16 U/L (ref 0–44)
AST: 19 U/L (ref 15–41)
Albumin: 3.9 g/dL (ref 3.5–5.0)
Alkaline Phosphatase: 56 U/L (ref 38–126)
Anion gap: 7 (ref 5–15)
BUN: 27 mg/dL — ABNORMAL HIGH (ref 8–23)
CO2: 24 mmol/L (ref 22–32)
Calcium: 9 mg/dL (ref 8.9–10.3)
Chloride: 101 mmol/L (ref 98–111)
Creatinine, Ser: 1.57 mg/dL — ABNORMAL HIGH (ref 0.61–1.24)
GFR, Estimated: 45 mL/min — ABNORMAL LOW (ref >60)
Glucose, Bld: 107 mg/dL — ABNORMAL HIGH (ref 70–99)
Potassium: 3.8 mmol/L (ref 3.5–5.1)
Sodium: 132 mmol/L — ABNORMAL LOW (ref 135–145)
Total Bilirubin: 0.7 mg/dL (ref 0.3–1.2)
Total Protein: 7 g/dL (ref 6.5–8.1)

## 2022-08-18 LAB — HEPATITIS C ANTIBODY: HCV Ab: NONREACTIVE

## 2022-08-18 MED ORDER — ATORVASTATIN CALCIUM 80 MG PO TABS: ORAL_TABLET | ORAL | 1 refills | Status: DC

## 2022-08-18 MED ORDER — TRAZODONE HCL 50 MG PO TABS: ORAL_TABLET | ORAL | 1 refills | Status: DC

## 2022-08-18 NOTE — Progress Notes (Signed)
Date:  08/18/2022   Name:  David Mccullough   DOB:  [birth year]   MRN:  [id number]   Chief Complaint: Insomnia  Insomnia Primary symptoms: difficulty falling asleep.   The current episode started more than one year. The onset quality is gradual. The problem has been gradually improving since onset. The treatment provided significant relief.  Hyperlipidemia This is a chronic problem. The current episode started more than 1 year ago. The problem is controlled. Recent lipid tests were reviewed and are normal. He has no history of chronic renal disease, diabetes, hypothyroidism, liver disease, obesity or nephrotic syndrome. Pertinent negatives include no chest pain, focal sensory loss, focal weakness, leg pain, myalgias or shortness of breath. Current antihyperlipidemic treatment includes statins. The current treatment provides moderate improvement of lipids. There are no compliance problems.  Risk factors for coronary artery disease include dyslipidemia.    Lab Results  Component Value Date   NA 143 10/14/2021   K 4.5 10/14/2021   CO2 23 10/14/2021   GLUCOSE 82 10/14/2021   BUN 22 10/14/2021   CREATININE 2.00 (H) 02/13/2022   CALCIUM 9.9 10/14/2021   EGFR 40 (L) 10/14/2021   GFRNONAA 42 (L) 09/09/2021   Lab Results  Component Value Date   CHOL 117 03/15/2019   HDL 36 (L) 03/15/2019   LDLCALC 62 03/15/2019   TRIG 100 03/15/2019   CHOLHDL 4.4 03/17/2018   Lab Results  Component Value Date   TSH 2.360 01/17/2019   Lab Results  Component Value Date   HGBA1C 5.2 03/17/2018   Lab Results  Component Value Date   WBC 11.6 (H) 09/09/2021   HGB 12.6 (L) 09/09/2021   HCT 37.5 (L) 09/09/2021   MCV 84.7 09/09/2021   PLT 133 (L) 09/09/2021   Lab Results  Component Value Date   ALT 16 03/15/2019   AST 18 03/15/2019   ALKPHOS 59 03/15/2019   BILITOT 0.6 03/15/2019   No results found for: "25OHVITD2", "25OHVITD3", "VD25OH"   Review of Systems  Constitutional:  Negative for  chills and fever.  HENT:  Negative for drooling, ear discharge, ear pain and sore throat.   Respiratory:  Negative for cough, shortness of breath and wheezing.   Cardiovascular:  Negative for chest pain, palpitations and leg swelling.  Gastrointestinal:  Negative for abdominal pain, blood in stool, constipation, diarrhea and nausea.  Endocrine: Negative for polydipsia.  Genitourinary:  Negative for dysuria, frequency, hematuria and urgency.  Musculoskeletal:  Negative for back pain, myalgias and neck pain.  Skin:  Negative for rash.  Allergic/Immunologic: Negative for environmental allergies.  Neurological:  Negative for dizziness, focal weakness and headaches.  Hematological:  Does not bruise/bleed easily.  Psychiatric/Behavioral:  Negative for suicidal ideas. The patient has insomnia. The patient is not nervous/anxious.     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   NSTEMI (non-ST elevated myocardial infarction) ([place]) 03/14/2018    No Known Allergies  Past Surgical History:  Procedure Laterality Date   COLONOSCOPY     CORONARY ARTERY BYPASS GRAFT N/A 03/22/2018   Procedure: CORONARY ARTERY BYPASS GRAFTING (CABG) x 3 WITH ENDOSCOPIC HARVESTING OF RIGHT GREATER SAPHENOUS VEIN;  Surgeon: Ivin Poot, MD;  Location: [facility];  Service: Open Heart Surgery;  Laterality: N/A;   CORONARY STENT INTERVENTION N/A 03/16/2018  Procedure: CORONARY STENT INTERVENTION;  Surgeon: Yolonda Kida, MD;  Location: [facility];  Service: Cardiovascular;  Laterality: N/A;   LAPAROSCOPIC NEPHRECTOMY, HAND ASSISTED Left 09/08/2021   Procedure: HAND ASSISTED LAPAROSCOPIC NEPHRECTOMY;  Surgeon: Hollice Espy, MD;  Location: [facility];  Service: Urology;  Laterality: Left;   LEFT ATRIAL APPENDAGE OCCLUSION N/A 03/22/2018    Procedure: LEFT ATRIAL APPENDAGE OCCLUSION USING 40 MM ATRICURE ATRICLIP;  Surgeon: Ivin Poot, MD;  Location: [facility];  Service: Open Heart Surgery;  Laterality: N/A;   LEFT HEART CATH AND CORONARY ANGIOGRAPHY N/A 03/15/2018   Procedure: LEFT HEART CATH AND CORONARY ANGIOGRAPHY and PCI stent;  Surgeon: Yolonda Kida, MD;  Location: [facility];  Service: Cardiovascular;  Laterality: N/A;   SVT ABLATION N/A 2009   Procedure: SVT ABLATION; Location: performed while living in [place].   TEE WITHOUT CARDIOVERSION N/A 03/22/2018   Procedure: TRANSESOPHAGEAL ECHOCARDIOGRAM (TEE);  Surgeon: Prescott Gum, Collier Salina, MD;  Location: [facility];  Service: Open Heart Surgery;  Laterality: N/A;   TRANSURETHRAL RESECTION OF BLADDER TUMOR N/A 07/28/2021   Procedure: TRANSURETHRAL RESECTION OF BLADDER TUMOR (TURBT)WITH  GEMCITABINE;  Surgeon: Hollice Espy, MD;  Location: [facility];  Service: Urology;  Laterality: N/A;    Social History   Tobacco Use   Smoking status: Former    Packs/day: 1.00    Years: 25.00    Total pack years: 25.00    Types: Cigarettes    Quit date: 06/30/1987    Years since quitting: 35.1    Passive exposure: Never   Smokeless tobacco: Never  Vaping Use   Vaping Use: Never used  Substance Use Topics   Alcohol use: Not Currently   Drug use: Not Currently     Medication list has been reviewed and updated.  Current Meds  Medication Sig   aspirin EC 81 MG tablet Take 81 mg by mouth daily. Swallow whole.   atorvastatin (LIPITOR) 80 MG tablet TAKE 1 TABLET(80 MG) BY MOUTH DAILY AT 6 PM   calcium carbonate (TUMS - DOSED IN MG ELEMENTAL CALCIUM) 500 MG chewable tablet Chew 1 tablet by mouth as needed for indigestion or heartburn.   finasteride (PROSCAR) 5 MG tablet Take 1 tablet (5 mg total) by mouth daily.   Multiple Vitamins-Minerals (MULTIVITAMIN WITH MINERALS) tablet Take 1 tablet by mouth daily.   Omega-3 Fatty Acids (FISH OIL PO) Take 1 tablet by mouth daily.    tamsulosin (FLOMAX) 0.4 MG CAPS capsule TAKE 1 CAPSULE(0.4 MG) BY MOUTH DAILY   traZODone (DESYREL) 50 MG tablet Take 1 tablet by mouth daily   Vibegron (GEMTESA) 75 MG TABS Take 75 mg by mouth daily.   [DISCONTINUED] docusate sodium (COLACE) 100 MG capsule Take 1 capsule (100 mg total) by mouth 2 (two) times daily.       08/18/2022    9:21 AM 02/13/2022    2:39 PM 08/07/2021    4:09 PM 08/14/2020    9:27 AM  GAD 7 : Generalized Anxiety Score  Nervous, Anxious, on Edge 0 0 0 0  Control/stop worrying 0 1 0 0  Worry too much - different things 0 1 0 2  Trouble relaxing 0 0 0 0  Restless 0 0 0 0  Easily annoyed or irritable 0 0 0 0  Afraid - awful might happen 0 1 0 0  Total GAD 7 Score 0 3 0 2  Anxiety Difficulty Not difficult at all Not difficult at all Not difficult at  all Not difficult at all       08/18/2022    9:20 AM 02/13/2022    2:38 PM 10/15/2021    9:43 AM  Depression screen PHQ 2/9  Decreased Interest 0 0 1  Down, Depressed, Hopeless 0 1 1  PHQ - 2 Score 0 1 2  Altered sleeping 0 1 1  Tired, decreased energy 0 0 1  Change in appetite 0 0 0  Feeling bad or failure about yourself  0 0 0  Trouble concentrating 0 0 0  Moving slowly or fidgety/restless 0 0 1  Suicidal thoughts 0 0 0  PHQ-9 Score 0 2 5  Difficult doing work/chores Not difficult at all Not difficult at all Not difficult at all    BP Readings from Last 3 Encounters:  08/18/22 128/70  05/15/22 (!) 149/84  02/25/22 135/66    Physical Exam Vitals and nursing note reviewed.  HENT:     Head: Normocephalic.     Right Ear: Tympanic membrane, ear canal and external ear normal.     Left Ear: Tympanic membrane, ear canal and external ear normal.     Nose: Nose normal.     Mouth/Throat:     Mouth: Mucous membranes are moist.  Eyes:     General: No scleral icterus.       Right eye: No discharge.        Left eye: No discharge.     Conjunctiva/sclera: Conjunctivae normal.     Pupils: Pupils are equal,  round, and reactive to light.  Neck:     Thyroid: No thyromegaly.     Vascular: No JVD.     Trachea: No tracheal deviation.  Cardiovascular:     Rate and Rhythm: Normal rate and regular rhythm.     Heart sounds: Normal heart sounds, S1 normal and S2 normal. No murmur heard.    No systolic murmur is present.     No diastolic murmur is present.     No friction rub. No gallop. No S3 or S4 sounds.  Pulmonary:     Effort: No respiratory distress.     Breath sounds: Normal breath sounds. No wheezing, rhonchi or rales.  Abdominal:     General: Bowel sounds are normal.     Palpations: Abdomen is soft. There is no mass.     Tenderness: There is no abdominal tenderness. There is no guarding or rebound.  Musculoskeletal:        General: No tenderness. Normal range of motion.     Cervical back: Normal range of motion and neck supple.  Lymphadenopathy:     Cervical: No cervical adenopathy.  Skin:    General: Skin is warm.     Findings: No rash.  Neurological:     Mental Status: He is alert and oriented to person, place, and time.     Cranial Nerves: No cranial nerve deficit.     Deep Tendon Reflexes: Reflexes are normal and symmetric.     Wt Readings from Last 3 Encounters:  08/18/22 214 lb (97.1 kg)  05/15/22 200 lb (90.7 kg)  02/25/22 206 lb (93.4 kg)    BP 128/70   Pulse 84   Ht 6' (1.829 m)   Wt 214 lb (97.1 kg)   SpO2 96%   BMI 29.02 kg/m   Assessment and Plan: 1. Dyslipidemia Chronic.  Controlled.  Stable.  Asymptomatic.  Tolerating medication well.  Continue atorvastatin 80 mg once a day.  Will check lipid panel  for current level of LDL and CMP for transaminases. - atorvastatin (LIPITOR) 80 MG tablet; TAKE 1 TABLET(80 MG) BY MOUTH DAILY AT 6 PM  Dispense: 90 tablet; Refill: 1 - Lipid Panel With LDL/HDL Ratio - Comprehensive Metabolic Panel (CMET)  2. Insomnia, unspecified type Chronic.  Controlled.  Stable.  Patient is only taking a half a pill now and is getting good  results without daytime somnolence.  Continue trazodone 50 mg 1/2 to 1 tablet nightly. - traZODone (DESYREL) 50 MG tablet; Take 1 tablet by mouth daily  Dispense: 90 tablet; Refill: 1  3. Need for hepatitis C screening test Chronic.  Controlled.  Stable.  Patient has not been screened for hepatitis C and we will do one-time screening. - Hepatitis C Antibody     Otilio Miu, MD

## 2022-08-26 ENCOUNTER — Ambulatory Visit
Admission: RE | Admit: 2022-08-26 | Discharge: 2022-08-26 | Disposition: A | Discharge status: Home / Self Care | Payer: Medicare Other | Source: Ambulatory Visit | Attending: Oncology | Admitting: Oncology

## 2022-08-26 DIAGNOSIS — C642 Malignant neoplasm of left kidney, except renal pelvis: Secondary | ICD-10-CM | POA: Insufficient documentation

## 2022-08-26 DIAGNOSIS — K429 Umbilical hernia without obstruction or gangrene: Secondary | ICD-10-CM | POA: Diagnosis not present

## 2022-08-26 DIAGNOSIS — K8020 Calculus of gallbladder without cholecystitis without obstruction: Secondary | ICD-10-CM | POA: Diagnosis not present

## 2022-08-26 DIAGNOSIS — R911 Solitary pulmonary nodule: Secondary | ICD-10-CM | POA: Diagnosis not present

## 2022-08-26 MED ORDER — IOHEXOL 300 MG/ML  SOLN
80.0000 mL | Freq: Once | INTRAMUSCULAR | Status: AC | PRN
  Administered 2022-08-26: 80 mL via INTRAVENOUS

## 2022-09-01 ENCOUNTER — Encounter: Payer: Self-pay | Admitting: Oncology

## 2022-09-01 ENCOUNTER — Inpatient Hospital Stay: Payer: Medicare Other | Attending: Oncology | Admitting: Oncology

## 2022-09-01 VITALS — BP 149/70 | HR 79 | Resp 18 | Ht 72.0 in | Wt 216.0 lb

## 2022-09-01 DIAGNOSIS — Z85528 Personal history of other malignant neoplasm of kidney: Secondary | ICD-10-CM | POA: Insufficient documentation

## 2022-09-01 DIAGNOSIS — Z08 Encounter for follow-up examination after completed treatment for malignant neoplasm: Secondary | ICD-10-CM | POA: Insufficient documentation

## 2022-09-01 DIAGNOSIS — C642 Malignant neoplasm of left kidney, except renal pelvis: Secondary | ICD-10-CM

## 2022-09-01 NOTE — Progress Notes (Signed)
[place]  Telephone:(336) [phone] Fax:(336) [phone]  ID: [name] OB: [birth year]  MR#: [id number]  [id number]  Patient Care Team: Juline Patch, MD as PCP - General (Family Medicine) Hollice Espy, MD as Consulting Physician (Urology) Yolonda Kida, MD as Consulting Physician (Cardiology) Lloyd Huger, MD as Consulting Physician (Oncology) [name], RN as [place] [facility]  CHIEF COMPLAINT: Stage I left renal cell carcinoma.  INTERVAL HISTORY: Patient returns to clinic today for routine 28-monthevaluation and discussion of his imaging results.  He continues to feel well and remains asymptomatic. He has no neurologic complaints.  He denies any recent fevers or illnesses.  He has a good appetite and denies weight loss.  He has no chest pain, shortness of breath, cough, or hemoptysis.  He denies any nausea, vomiting, constipation, or diarrhea.  He has no urinary complaints.  Patient offers no specific complaints today.  REVIEW OF SYSTEMS:   Review of Systems  Constitutional: Negative.  Negative for fever, malaise/fatigue and weight loss.  Respiratory: Negative.  Negative for cough, hemoptysis and shortness of breath.   Cardiovascular: Negative.  Negative for chest pain and leg swelling.  Gastrointestinal: Negative.  Negative for abdominal pain.  Genitourinary: Negative.  Negative for dysuria.  Musculoskeletal: Negative.  Negative for back pain.  Skin: Negative.  Negative for rash.  Neurological: Negative.  Negative for dizziness, focal weakness, weakness and headaches.  Psychiatric/Behavioral: Negative.  The patient is not nervous/anxious.     As per HPI. Otherwise, a complete review of systems is negative.  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   Colon polyps    Depression    Elevated PSA    Enlarged prostate    GERD (gastroesophageal reflux disease)    Hyperlipidemia    Hypertension    Nephrolithiasis    NSTEMI (non-ST elevated myocardial infarction) ([place] 03/14/2018   a.) LHC 03/15/2018 --> 80% pLAD, 99% OM1, 100% pRCA --> [facility]; underwent 3v CABG (LIMA-LAD, SVG-OM, SVG-RI) on 03/25/2018.   Persistent atrial fibrillation (HCC)    a.) CHA2DS2-VASc = 4 (age x2, HTN, prior MI). b.) rate/rhythm maintained without pharmacological intervention; no daily anticoagulation.   Renal cell cancer, left ([place] 05/27/2021   a.) CT urogram --> 4.1 x 3.6 x 4.7 cm mass at LEFT kidney pole. b.) CNB (+) for RCC, conventional clear cell type, nuclear grade II. 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 d.) PET CT 07/31/2021 revealed FDG avid LEFT renal mass (SUV 4.12); scheduled for nephrectomy.   Right lower lobe pulmonary nodule 05/27/2021   a.) CT chest --> measured 9 mm.   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   a.) 3v CABG: LIMA-LAD, SVG-OM, SVG-RI   SVT (supraventricular tachycardia) 2009   a.) [place]   PAST SURGICAL HISTORY: Past Surgical History:  Procedure Laterality Date   COLONOSCOPY  CORONARY ARTERY BYPASS GRAFT N/A 03/22/2018   Procedure: CORONARY ARTERY BYPASS GRAFTING (CABG) x 3 WITH ENDOSCOPIC HARVESTING OF RIGHT GREATER SAPHENOUS VEIN;  Surgeon: Ivin Poot, MD;  Location: [place];  Service: Open Heart Surgery;  Laterality: N/A;   CORONARY STENT INTERVENTION N/A  03/16/2018   Procedure: CORONARY STENT INTERVENTION;  Surgeon: Yolonda Kida, MD;  Location: [place] [facility];  Service: Cardiovascular;  Laterality: N/A;   LAPAROSCOPIC NEPHRECTOMY, HAND ASSISTED Left 09/08/2021   Procedure: HAND ASSISTED LAPAROSCOPIC NEPHRECTOMY;  Surgeon: Hollice Espy, MD;  Location: [facility];  Service: Urology;  Laterality: Left;   LEFT ATRIAL APPENDAGE OCCLUSION N/A 03/22/2018   Procedure: LEFT ATRIAL APPENDAGE OCCLUSION USING 40 MM ATRICURE ATRICLIP;  Surgeon: Ivin Poot, MD;  Location: [place];  Service: Open Heart Surgery;  Laterality: N/A;   LEFT HEART CATH AND CORONARY ANGIOGRAPHY N/A 03/15/2018   Procedure: LEFT HEART CATH AND CORONARY ANGIOGRAPHY and PCI stent;  Surgeon: Yolonda Kida, MD;  Location: [place] [facility];  Service: Cardiovascular;  Laterality: N/A;   SVT ABLATION N/A 2009   Procedure: SVT ABLATION; Location: performed while living in [place].   TEE WITHOUT CARDIOVERSION N/A 03/22/2018   Procedure: TRANSESOPHAGEAL ECHOCARDIOGRAM (TEE);  Surgeon: Prescott Gum, Collier Salina, MD;  Location: [place];  Service: Open Heart Surgery;  Laterality: N/A;   TRANSURETHRAL RESECTION OF BLADDER TUMOR N/A 07/28/2021   Procedure: TRANSURETHRAL RESECTION OF BLADDER TUMOR (TURBT)WITH  GEMCITABINE;  Surgeon: Hollice Espy, MD;  Location: [facility];  Service: Urology;  Laterality: N/A;    FAMILY HISTORY: Family History  Problem Relation Age of Onset   Heart disease Mother    Heart disease Father    Arrhythmia Sister    Heart disease Sister     ADVANCED DIRECTIVES (Y/N):  N  HEALTH MAINTENANCE: Social History   Tobacco Use   Smoking status: Former    Packs/day: 1.00    Years: 25.00    Total pack years: 25.00    Types: Cigarettes    Quit date: 06/30/1987    Years since quitting: 35.1    Passive exposure: Never   Smokeless tobacco: Never  Vaping Use   Vaping Use: Never used  Substance Use Topics   Alcohol use: Not Currently   Drug use: Not  Currently     Colonoscopy:  PAP:  Bone density:  Lipid panel:  No Known Allergies  Current Outpatient Medications  Medication Sig Dispense Refill   aspirin EC 81 MG tablet Take 81 mg by mouth daily. Swallow whole.     atorvastatin (LIPITOR) 80 MG tablet TAKE 1 TABLET(80 MG) BY MOUTH DAILY AT 6 PM 90 tablet 1   calcium carbonate (TUMS - DOSED IN MG ELEMENTAL CALCIUM) 500 MG chewable tablet Chew 1 tablet by mouth as needed for indigestion or heartburn.     finasteride (PROSCAR) 5 MG tablet Take 1 tablet (5 mg total) by mouth daily. 90 tablet 1   Multiple Vitamins-Minerals (MULTIVITAMIN WITH MINERALS) tablet Take 1 tablet by mouth daily.     Omega-3 Fatty Acids (FISH OIL PO) Take 1 tablet by mouth daily.     tamsulosin (FLOMAX) 0.4 MG CAPS capsule TAKE 1 CAPSULE(0.4 MG) BY MOUTH DAILY 90 capsule 3   traZODone (DESYREL) 50 MG tablet Take 1 tablet by mouth daily 90 tablet 1   Vibegron (GEMTESA) 75 MG TABS Take 75 mg by mouth daily. 30 tablet 11   No current facility-administered medications for this visit.    OBJECTIVE:  Vitals:   09/01/22 1352  BP: (!) 149/70  Pulse: 79  Resp: 18  SpO2: 99%     Body mass index is 29.29 kg/m.    ECOG FS:0 - Asymptomatic  General: Well-developed, well-nourished, no acute distress. Eyes: Pink conjunctiva, anicteric sclera. HEENT: Normocephalic, moist mucous membranes. Lungs: No audible wheezing or coughing. Heart: Regular rate and rhythm. Abdomen: Soft, nontender, no obvious distention. Musculoskeletal: No edema, cyanosis, or clubbing. Neuro: Alert, answering all questions appropriately. Cranial nerves grossly intact. Skin: No rashes or petechiae noted. Psych: Normal affect.   LAB RESULTS:  Lab Results  Component Value Date   NA 132 (L) 08/18/2022   K 3.8 08/18/2022   CL 101 08/18/2022   CO2 24 08/18/2022   GLUCOSE 107 (H) 08/18/2022   BUN 27 (H) 08/18/2022   CREATININE 1.57 (H) 08/18/2022   CALCIUM 9.0 08/18/2022   PROT 7.0  08/18/2022   ALBUMIN 3.9 08/18/2022   AST 19 08/18/2022   ALT 16 08/18/2022   ALKPHOS 56 08/18/2022   BILITOT 0.7 08/18/2022   GFRNONAA 45 (L) 08/18/2022   GFRAA >60 04/25/2018    Lab Results  Component Value Date   WBC 11.6 (H) 09/09/2021   NEUTROABS 4.0 06/26/2021   HGB 12.6 (L) 09/09/2021   HCT 37.5 (L) 09/09/2021   MCV 84.7 09/09/2021   PLT 133 (L) 09/09/2021     STUDIES: CT CHEST ABDOMEN PELVIS W CONTRAST  Result Date: 08/26/2022 CLINICAL DATA:  Renal cell carcinoma history of left nephrectomy. * Tracking Code: BO * EXAM: CT CHEST, ABDOMEN, AND PELVIS WITH CONTRAST TECHNIQUE: Multidetector CT imaging of the chest, abdomen and pelvis was performed following the standard protocol during bolus administration of intravenous contrast. RADIATION DOSE REDUCTION: This exam was performed according to the departmental dose-optimization program which includes automated exposure control, adjustment of the mA and/or kV according to patient size and/or use of iterative reconstruction technique. CONTRAST:  55m OMNIPAQUE IOHEXOL 300 MG/ML  SOLN COMPARISON:  Multiple priors including most recent CT February 13, 2022. FINDINGS: CT CHEST FINDINGS Cardiovascular: Aortic atherosclerosis. Aortic atherosclerosis. Prior median sternotomy and CABG with dense calcifications of the native coronary vessels. Biatrial cardiac enlargement. No significant pericardial effusion/thickening. Mediastinum/Nodes: No suspicious thyroid nodule. No pathologically enlarged mediastinal, hilar or axillary lymph nodes. The esophagus is grossly unremarkable. Lungs/Pleura: Stable solid 9 mm right lower lobe pulmonary nodule on image 121/4. Stable 4 mm right lower lobe pulmonary nodule on image 134/4. No new suspicious pulmonary nodules or masses. Mild paraseptal emphysema. No pleural effusion no pneumothorax. Musculoskeletal: No aggressive lytic or blastic lesion of bone. Prior median sternotomy. Thoracic spondylosis. CT ABDOMEN PELVIS  FINDINGS Hepatobiliary: No suspicious hepatic lesion. Cholelithiasis without findings of acute cholecystitis. No biliary ductal dilation. Pancreas: No pancreatic ductal dilation or evidence of acute inflammation. Spleen: No splenomegaly. Adrenals/Urinary Tract: Bilateral adrenal glands appear normal. Prior left nephrectomy without suspicious enhancing soft tissue nodularity in the nephrectomy bed. Right kidney is without hydronephrosis or suspicious renal mass. Right kidney demonstrates appropriate enhancement and excretion of contrast material. Effacement of the urinary bladder by an enlarged prostate gland. Stomach/Bowel: No radiopaque enteric contrast material was administered. Stomach is minimally distended limiting evaluation. No pathologic dilation of small or large bowel. Normal appendix. Questionable short segment wall thickening of the rectum on image 67/6 and 114/2. Vascular/Lymphatic: Aortic atherosclerosis. Smooth IVC contours. No pathologically enlarged abdominal or pelvic lymph nodes. Reproductive: Dystrophic calcifications in an enlarged prostate gland. Other: Small amount of fluid anterior to the rectum on  image 108/2. No walled off fluid collections. Postsurgical change in the anterior abdominal wall. Small Richter type paraumbilical hernia contains fat and nonobstructed portion of small bowel. Musculoskeletal: No aggressive lytic or blastic lesion of bone. Multilevel degenerative changes spine. Degenerative change of the bilateral hips. Intramuscular lipoma in the right gluteal muscle on image 104/2. IMPRESSION: 1. Prior left nephrectomy without evidence of local recurrence. 2. Stable right lower lobe pulmonary nodules measuring up to 9 mm, favored benign but warrants continued attention on follow-up imaging. 3. No evidence of new or progressive disease in the chest, abdomen or pelvis. 4. Questionable short segment wall thickening of the rectum with a small amount of fluid anterior to the rectum,  nonspecific consider correlation with direct visualization. 5. Cholelithiasis without findings of acute cholecystitis. 6. Small Richter type paraumbilical hernia contains fat and nonobstructed portion of small bowel. 7. Aortic Atherosclerosis (ICD10-I70.0) and Emphysema (ICD10-J43.9). Electronically Signed   By: Dahlia Bailiff M.D.   On: 08/26/2022 13:38    ASSESSMENT: Stage I left renal cell carcinoma.  PLAN:    Stage I left renal cell carcinoma: Patient underwent left nephrectomy on September 08, 2021 confirming stage of disease.  No adjuvant treatment was necessary.  CT scan on August 26, 2022 reviewed independently and reported as above with no obvious evidence of recurrent or progressive disease.  Per NCCN guidelines, will continue to monitor annually for approximately 5 years.  No further intervention is needed.  Return to clinic in 1 year with repeat imaging and further evaluation.   Right lower lobe pulmonary nodule: Previously, lesion was noted to be PET negative.  CT scan results as above with unchanged size and nodule.  Repeat CT scan in 1 year along with abdomen and pelvis.  If nodule remains stable at that time, can discontinue chest imaging.   Renal insufficiency: Patient's most recent creatinine is 1.57.  Monitor. Hypertension: Patient's blood pressure is moderately elevated today.  Continue monitoring and treatment per primary care.   Patient expressed understanding and was in agreement with this plan. He also understands that He can call clinic at any time with any questions, concerns, or complaints.    Cancer Staging  Renal cell adenocarcinoma, left [facility]) Staging form: Kidney, AJCC 8th Edition - Clinical stage from 08/19/2021: Stage I (cT1b, cN0, cM0) - Signed by Lloyd Huger, MD on 08/19/2021 Stage prefix: Initial diagnosis   Lloyd Huger, MD   09/01/2022 2:51 PM

## 2022-09-10 DIAGNOSIS — N2581 Secondary hyperparathyroidism of renal origin: Secondary | ICD-10-CM | POA: Diagnosis not present

## 2022-09-10 DIAGNOSIS — I10 Essential (primary) hypertension: Secondary | ICD-10-CM | POA: Diagnosis not present

## 2022-09-10 DIAGNOSIS — N1832 Chronic kidney disease, stage 3b: Secondary | ICD-10-CM | POA: Diagnosis not present

## 2022-10-15 ENCOUNTER — Other Ambulatory Visit: Payer: Self-pay | Admitting: Urology

## 2022-10-15 DIAGNOSIS — R3915 Urgency of urination: Secondary | ICD-10-CM

## 2022-10-17 ENCOUNTER — Other Ambulatory Visit: Payer: Self-pay

## 2022-10-17 ENCOUNTER — Emergency Department: Payer: Medicare Other

## 2022-10-17 ENCOUNTER — Encounter: Payer: Self-pay | Admitting: Emergency Medicine

## 2022-10-17 ENCOUNTER — Emergency Department
Admission: EM | Admit: 2022-10-17 | Discharge: 2022-10-17 | Disposition: A | Discharge status: Home / Self Care | Payer: Medicare Other | Attending: Emergency Medicine | Admitting: Emergency Medicine

## 2022-10-17 DIAGNOSIS — Z23 Encounter for immunization: Secondary | ICD-10-CM | POA: Diagnosis not present

## 2022-10-17 DIAGNOSIS — I482 Chronic atrial fibrillation, unspecified: Secondary | ICD-10-CM | POA: Insufficient documentation

## 2022-10-17 DIAGNOSIS — Z951 Presence of aortocoronary bypass graft: Secondary | ICD-10-CM | POA: Diagnosis not present

## 2022-10-17 DIAGNOSIS — M4312 Spondylolisthesis, cervical region: Secondary | ICD-10-CM | POA: Diagnosis not present

## 2022-10-17 DIAGNOSIS — I2510 Atherosclerotic heart disease of native coronary artery without angina pectoris: Secondary | ICD-10-CM | POA: Diagnosis not present

## 2022-10-17 DIAGNOSIS — W01198A Fall on same level from slipping, tripping and stumbling with subsequent striking against other object, initial encounter: Secondary | ICD-10-CM | POA: Diagnosis not present

## 2022-10-17 DIAGNOSIS — W19XXXA Unspecified fall, initial encounter: Secondary | ICD-10-CM | POA: Diagnosis not present

## 2022-10-17 DIAGNOSIS — S0003XA Contusion of scalp, initial encounter: Secondary | ICD-10-CM | POA: Diagnosis not present

## 2022-10-17 DIAGNOSIS — S0083XA Contusion of other part of head, initial encounter: Secondary | ICD-10-CM | POA: Insufficient documentation

## 2022-10-17 DIAGNOSIS — I10 Essential (primary) hypertension: Secondary | ICD-10-CM | POA: Diagnosis not present

## 2022-10-17 DIAGNOSIS — R000 Tachycardia, unspecified: Secondary | ICD-10-CM | POA: Diagnosis not present

## 2022-10-17 DIAGNOSIS — R42 Dizziness and giddiness: Secondary | ICD-10-CM | POA: Diagnosis not present

## 2022-10-17 DIAGNOSIS — R519 Headache, unspecified: Secondary | ICD-10-CM | POA: Diagnosis not present

## 2022-10-17 DIAGNOSIS — I4891 Unspecified atrial fibrillation: Secondary | ICD-10-CM | POA: Diagnosis not present

## 2022-10-17 DIAGNOSIS — R410 Disorientation, unspecified: Secondary | ICD-10-CM | POA: Diagnosis not present

## 2022-10-17 LAB — BASIC METABOLIC PANEL
Anion gap: 6 (ref 5–15)
BUN: 30 mg/dL — ABNORMAL HIGH (ref 8–23)
CO2: 24 mmol/L (ref 22–32)
Calcium: 9.1 mg/dL (ref 8.9–10.3)
Chloride: 105 mmol/L (ref 98–111)
Creatinine, Ser: 1.77 mg/dL — ABNORMAL HIGH (ref 0.61–1.24)
GFR, Estimated: 39 mL/min — ABNORMAL LOW (ref >60)
Glucose, Bld: 108 mg/dL — ABNORMAL HIGH (ref 70–99)
Potassium: 4.2 mmol/L (ref 3.5–5.1)
Sodium: 135 mmol/L (ref 135–145)

## 2022-10-17 LAB — CBC WITH DIFFERENTIAL/PLATELET
Abs Immature Granulocytes: 0.07 10*3/uL (ref 0.00–0.07)
Basophils Absolute: 0 10*3/uL (ref 0.0–0.1)
Basophils Relative: 0 %
Eosinophils Absolute: 0.1 10*3/uL (ref 0.0–0.5)
Eosinophils Relative: 1 %
HCT: 38.5 % — ABNORMAL LOW (ref 39.0–52.0)
Hemoglobin: 13.1 g/dL (ref 13.0–17.0)
Immature Granulocytes: 1 %
Lymphocytes Relative: 11 %
Lymphs Abs: 1.1 10*3/uL (ref 0.7–4.0)
MCH: 28.4 pg (ref 26.0–34.0)
MCHC: 34 g/dL (ref 30.0–36.0)
MCV: 83.3 fL (ref 80.0–100.0)
Monocytes Absolute: 0.5 10*3/uL (ref 0.1–1.0)
Monocytes Relative: 5 %
Neutro Abs: 8.4 10*3/uL — ABNORMAL HIGH (ref 1.7–7.7)
Neutrophils Relative %: 82 %
Platelets: 185 10*3/uL (ref 150–400)
RBC: 4.62 MIL/uL (ref 4.22–5.81)
RDW: 12.7 % (ref 11.5–15.5)
WBC: 10.1 10*3/uL (ref 4.0–10.5)
nRBC: 0 % (ref 0.0–0.2)

## 2022-10-17 LAB — TROPONIN I (HIGH SENSITIVITY)
Troponin I (High Sensitivity): 20 ng/L — ABNORMAL HIGH (ref <18)
Troponin I (High Sensitivity): 31 ng/L — ABNORMAL HIGH (ref <18)

## 2022-10-17 MED ORDER — SODIUM CHLORIDE 0.9 % IV BOLUS
1000.0000 mL | Freq: Once | INTRAVENOUS | Status: AC
  Administered 2022-10-17: 1000 mL via INTRAVENOUS

## 2022-10-17 MED ORDER — TETANUS-DIPHTH-ACELL PERTUSSIS 5-2.5-18.5 LF-MCG/0.5 IM SUSY
0.5000 mL | PREFILLED_SYRINGE | Freq: Once | INTRAMUSCULAR | Status: AC
  Administered 2022-10-17: 0.5 mL via INTRAMUSCULAR
  Filled 2022-10-17: qty 0.5

## 2022-10-17 NOTE — ED Provider Notes (Signed)
[facility] Provider Note    Event Date/Time   First MD Initiated Contact with Patient 10/17/22 1130     (approximate)   History   Chief Complaint: Fall   HPI  David Mccullough is a 77 y.o. male with a history of CAD, CABG, RCC status post nephrectomy who comes to the ED after a fall.  He had walked 2 miles this morning with his spouse, and on returning back to their residential complex, he became momentarily lightheaded.  He grabbed onto a fence to steady himself but then lost his balance and fell forward.  He put his hands out to try and catch himself.  He did hit his face on the ground.  No loss of consciousness.  Denies any headache vision change paresthesias motor weakness or neck pain.  No other complaints.  No chest pain or shortness of breath.  Prior to this he was in his usual state of health and feeling well.  Normal oral intake.     Physical Exam   Triage Vital Signs: ED Triage Vitals  Enc Vitals Group     BP 10/17/22 1122 (!) 151/75     Pulse Rate 10/17/22 1122 79     Resp 10/17/22 1122 14     Temp 10/17/22 1122 98.2 F (36.8 C)     Temp src --      SpO2 10/17/22 1122 97 %     Weight 10/17/22 1119 200 lb (90.7 kg)     Height 10/17/22 1119 6' (1.829 m)     Head Circumference --      Peak Flow --      Pain Score --      Pain Loc --      Pain Edu? --      Excl. in GC? --     Most recent vital signs: Vitals:   10/17/22 1200 10/17/22 1230  BP: (!) 149/73 136/79  Pulse: 78 68  Resp: 14 14  Temp:    SpO2: 98% 99%    General: Awake, no distress.  CV:  Good peripheral perfusion.  Irregular rhythm, normal rate.  Normal distal pulses. Resp:  Normal effort.  Clear to auscultation bilaterally. Abd:  No distention.  Soft, nontender Other:  Scattered abrasions over the forehead and face including maxilla and lip.  No intraoral injury or dental injury.  No lacerations.  No septal hematoma, no Battle sign or raccoon eyes.  Intact extraocular  movements.   ED Results / Procedures / Treatments   Labs (all labs ordered are listed, but only abnormal results are displayed) Labs Reviewed  BASIC METABOLIC PANEL - Abnormal; Notable for the following components:      Result Value   Glucose, Bld 108 (*)    BUN 30 (*)    Creatinine, Ser 1.77 (*)    GFR, Estimated 39 (*)    All other components within normal limits  CBC WITH DIFFERENTIAL/PLATELET - Abnormal; Notable for the following components:   HCT 38.5 (*)    Neutro Abs 8.4 (*)    All other components within normal limits  TROPONIN I (HIGH SENSITIVITY) - Abnormal; Notable for the following components:   Troponin I (High Sensitivity) 20 (*)    All other components within normal limits  TROPONIN I (HIGH SENSITIVITY)     EKG Interpreted by me Atrial fibrillation rate of 91.  Normal axis, normal intervals.  Poor R wave progression.  Normal ST segments and T waves.  2  PVCs on the strip   RADIOLOGY CT head interpreted by me, negative for intracranial hemorrhage.  Radiology report reviewed.  CT cervical spine unremarkable.   PROCEDURES:  Procedures   MEDICATIONS ORDERED IN ED: Medications  sodium chloride 0.9 % bolus 1,000 mL (0 mLs Intravenous Stopped 10/17/22 1524)  Tdap (BOOSTRIX) injection 0.5 mL (0.5 mLs Intramuscular Given 10/17/22 1332)     IMPRESSION / MDM / ASSESSMENT AND PLAN / ED COURSE  I reviewed the triage vital signs and the nursing notes.  DDx: Dehydration, AKI, electrolyte abnormality, anemia, non-STEMI, C-spine fracture, intracranial hemorrhage.  Doubt acute arrhythmia, stroke, aortic dissection, PE, or sepsis  Patient's presentation is most consistent with acute presentation with potential threat to life or bodily function.  Patient presents with a mechanical fall.  Tetanus updated.  No wounds requiring repair though he does have scattered abrasions on hands and face.  CT imaging for head and neck are negative.  Labs are essentially normal.  Will  trend his troponin and check orthostatics.  If reassuring he can be discharged home.       FINAL CLINICAL IMPRESSION(S) / ED DIAGNOSES   Final diagnoses:  Contusion of face, initial encounter  Chronic atrial fibrillation     Rx / DC Orders   ED Discharge Orders     None        Note:  This document was prepared using Dragon voice recognition software and may include unintentional dictation errors.   Sharman Cheek, MD 10/17/22 [phone]

## 2022-10-17 NOTE — ED Triage Notes (Signed)
Pt to ER via EMS from home after a witnessed fall.  Per EMS pt was noted to stumble out of apartment and down sidewalk falling and striking face.  Pt has no memory of event.  Wife was witness, denies LOC, no thinners.  Pt has hx of CABG.  VSS per EMS, CBG 119

## 2022-10-17 NOTE — Discharge Instructions (Signed)
Drink plenty of fluids to stay well-hydrated.  Follow-up with your primary doctor for visit this week for checkup.  If you develop any new, worsening or expected symptoms call your doctor right away or come to the emergency department for reevaluation.

## 2022-10-19 ENCOUNTER — Telehealth: Payer: Self-pay

## 2022-10-19 ENCOUNTER — Ambulatory Visit
Admission: RE | Admit: 2022-10-19 | Discharge: 2022-10-19 | Disposition: A | Discharge status: Home / Self Care | Payer: Medicare Other | Source: Ambulatory Visit | Attending: Family Medicine | Admitting: Family Medicine

## 2022-10-19 ENCOUNTER — Ambulatory Visit
Admission: RE | Admit: 2022-10-19 | Discharge: 2022-10-19 | Disposition: A | Discharge status: Home / Self Care | Payer: Medicare Other | Attending: Family Medicine | Admitting: Family Medicine

## 2022-10-19 ENCOUNTER — Encounter: Payer: Self-pay | Admitting: Family Medicine

## 2022-10-19 ENCOUNTER — Ambulatory Visit (INDEPENDENT_AMBULATORY_CARE_PROVIDER_SITE_OTHER): Payer: Medicare Other | Admitting: Family Medicine

## 2022-10-19 ENCOUNTER — Ambulatory Visit: Payer: Self-pay

## 2022-10-19 VITALS — BP 120/78 | HR 74 | Ht 72.0 in | Wt 218.0 lb

## 2022-10-19 DIAGNOSIS — M1812 Unilateral primary osteoarthritis of first carpometacarpal joint, left hand: Secondary | ICD-10-CM | POA: Insufficient documentation

## 2022-10-19 DIAGNOSIS — M79642 Pain in left hand: Secondary | ICD-10-CM

## 2022-10-19 DIAGNOSIS — I4811 Longstanding persistent atrial fibrillation: Secondary | ICD-10-CM | POA: Diagnosis not present

## 2022-10-19 DIAGNOSIS — R55 Syncope and collapse: Secondary | ICD-10-CM | POA: Diagnosis not present

## 2022-10-19 DIAGNOSIS — W101XXA Fall (on)(from) sidewalk curb, initial encounter: Secondary | ICD-10-CM | POA: Diagnosis not present

## 2022-10-19 DIAGNOSIS — M7989 Other specified soft tissue disorders: Secondary | ICD-10-CM | POA: Diagnosis not present

## 2022-10-19 LAB — EKG 12-LEAD

## 2022-10-19 NOTE — Telephone Encounter (Signed)
  Chief Complaint: Fall -  Symptoms: facial injuries, Swollen left hand Frequency: Saturday Pertinent Negatives: Patient denies  Disposition: ED /[] Urgent Care (no appt availability in office) / Appointment(In office/virtual)/  [place] [facility]/ Home Care/ Refused Recommended Disposition /[] [place] [facility]/  Follow-up with PCP Additional Notes: Spoke with pt's wife. Pt fell on Saturday and was seen at ED. PT has multiple facial injuries. Since fall his left hand/wrist is swollen.  IV was place in left hand in ED.  Unsure if pt tried to break his fall with left hand.    Reason for Disposition  Can't use injured hand normally (e.g., make a fist, open fully, hold a glass of water)  Answer Assessment - Initial Assessment Questions 1. MECHANISM: "How did the injury happen?"     Fell and hit concrete steps 2. ONSET: "When did the injury happen?" (Minutes or hours ago)      Over the weekend 3. APPEARANCE of INJURY: "What does the injury look like?"      Swollen 4. SEVERITY: "Can you use the hand normally?" "Can you bend your fingers into a ball and then fully open them?"      5. SIZE: For cuts, bruises, or swelling, ask: "How large is it?" (e.g., inches or centimeters;  entire hand or wrist)      Swollen left hand 6. PAIN: "Is there pain?" If Yes, ask: "How bad is the pain?"  (Scale 1-10; or mild, moderate, severe)      7. TETANUS: For any breaks in the skin, ask: "When was the last tetanus booster?"      8. OTHER SYMPTOMS: "Do you have any other symptoms?"      Black eyes, multiple facial injuries  Protocols used: Hand and Wrist Injury-A-AH

## 2022-10-19 NOTE — Transitions of Care (Post Inpatient/ED Visit) (Signed)
   10/19/2022  Name: David Mccullough MRN: [id number] DOB: September 28, 1945  Today's TOC FU Call Status: Today's TOC FU Call Status:: Successful TOC FU Call Competed TOC FU Call Complete Date: 10/19/22  Transition Care Management Follow-up Telephone Call Date of Discharge: 10/17/22 Discharge Facility[facility]) Type of Discharge: Emergency Department Reason for ED Visit:  (fall) How have you been since you were released from the hospital?: Better Any questions or concerns?: Yes (swelling in left hand) Patient Questions/Concerns:: swelling in left hand Patient Questions/Concerns Addressed: Notified Provider of Patient Questions/Concerns  Items Reviewed: Did you receive and understand the discharge instructions provided?: Yes Medications obtained and verified?: Yes (Medications Reviewed) Any new allergies since your discharge?: No Dietary orders reviewed?: NA Do you have support at home?: Yes People in Home: spouse  Home Care and Equipment/Supplies: Were Home Health Services Ordered?: No Any new equipment or medical supplies ordered?: No  Functional Questionnaire: Do you need assistance with bathing/showering or dressing?: No Do you need assistance with meal preparation?: No Do you need assistance with eating?: No Do you have difficulty maintaining continence: No Do you need assistance with getting out of bed/getting out of a chair/moving?: No Do you have difficulty managing or taking your medications?: No  Follow up appointments reviewed: PCP Follow-up appointment confirmed?: Yes Date of PCP follow-up appointment?: 10/19/22 Follow-up Provider: Dr. Yetta Barre Specialist [facility] Follow-up appointment confirmed?: NA Do you need transportation to your follow-up appointment?: No Do you understand care options if your condition(s) worsen?: Yes-patient verbalized understanding    SIGNATURE Mariann Barter Jalil Lorusso, CMA

## 2022-10-19 NOTE — Progress Notes (Signed)
Date:  10/19/2022   Name:  David Mccullough   DOB:  02-05-1946   MRN:  [id number]   Chief Complaint: Fall [facility] follow up )  Patient is a 77 year old male who presents for a follow-up emergency room visit exam. The patient reports the following problems: Left hand pain with swelling and follow-up near syncope syncope episode.[facility] maintenance has been reviewed up-to-date    Fall The accident occurred 2 days ago. Fall occurred: after walking. The point of impact was the left wrist and face. The pain is present in the left hand. The pain is moderate. The symptoms are aggravated by pressure on injury. Associated symptoms include a loss of consciousness. Pertinent negatives include no abdominal pain, fever, numbness or tingling. Associated symptoms comments: Questionablem LOC. He has tried acetaminophen for the symptoms. The treatment provided no relief.  Hand Injury  The incident occurred 2 days ago. The incident occurred at home. The injury mechanism was a fall. The pain is present in the left hand. The quality of the pain is described as aching. Pertinent negatives include no chest pain, muscle weakness, numbness or tingling. He has tried nothing for the symptoms.  Loss of Consciousness This is a new problem. The current episode started in the past 7 days (saturday). There was no loss of consciousness (Some question as to page completely lost consciousness patient either has retrograde amnesia and does not remember striking the ground or there was a momentary loss of consciousness less than 1 minute.  I do think there was some confusion at the time.). Associated symptoms include confusion. Pertinent negatives include no abdominal pain, chest pain, diaphoresis, dizziness, fever, light-headedness, palpitations or slurred speech. He has tried nothing for the symptoms. The treatment provided moderate relief. His past medical history is significant for CAD. CABGx3    Lab Results  Component Value  Date   NA 135 10/17/2022   K 4.2 10/17/2022   CO2 24 10/17/2022   GLUCOSE 108 (H) 10/17/2022   BUN 30 (H) 10/17/2022   CREATININE 1.77 (H) 10/17/2022   CALCIUM 9.1 10/17/2022   EGFR 40 (L) 10/14/2021   GFRNONAA 39 (L) 10/17/2022   Lab Results  Component Value Date   CHOL 127 08/18/2022   HDL 34 (L) 08/18/2022   LDLCALC 76 08/18/2022   TRIG 87 08/18/2022   CHOLHDL 3.7 08/18/2022   Lab Results  Component Value Date   TSH 2.360 01/17/2019   Lab Results  Component Value Date   HGBA1C 5.2 03/17/2018   Lab Results  Component Value Date   WBC 10.1 10/17/2022   HGB 13.1 10/17/2022   HCT 38.5 (L) 10/17/2022   MCV 83.3 10/17/2022   PLT 185 10/17/2022   Lab Results  Component Value Date   ALT 16 08/18/2022   AST 19 08/18/2022   ALKPHOS 56 08/18/2022   BILITOT 0.7 08/18/2022   No results found for: "25OHVITD2", "25OHVITD3", "VD25OH"   Review of Systems  Constitutional:  Negative for diaphoresis and fever.  Eyes:  Negative for visual disturbance.  Respiratory:  Negative for cough, choking, chest tightness, shortness of breath and wheezing.   Cardiovascular:  Positive for syncope. Negative for chest pain, palpitations and leg swelling.  Gastrointestinal:  Negative for abdominal pain.  Neurological:  Positive for loss of consciousness. Negative for dizziness, tingling, light-headedness and numbness.  Psychiatric/Behavioral:  Positive for confusion.     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   NSTEMI (non-ST elevated myocardial infarction) 03/14/2018    No Known Allergies  Past Surgical History:  Procedure Laterality Date   COLONOSCOPY     CORONARY ARTERY BYPASS GRAFT N/A 03/22/2018   Procedure: CORONARY ARTERY BYPASS GRAFTING (CABG) x 3 WITH ENDOSCOPIC HARVESTING OF RIGHT GREATER SAPHENOUS VEIN;  Surgeon:  Kerin Perna, MD;  Location: [facility] OR;  Service: Open Heart Surgery;  Laterality: N/A;   CORONARY STENT INTERVENTION N/A 03/16/2018   Procedure: CORONARY STENT INTERVENTION;  Surgeon: Alwyn Pea, MD;  Location: [facility] INVASIVE CV LAB;  Service: Cardiovascular;  Laterality: N/A;   LAPAROSCOPIC NEPHRECTOMY, HAND ASSISTED Left 09/08/2021   Procedure: HAND ASSISTED LAPAROSCOPIC NEPHRECTOMY;  Surgeon: [name], MD;  Location: [facility] ORS;  Service: Urology;  Laterality: Left;   LEFT ATRIAL APPENDAGE OCCLUSION N/A 03/22/2018   Procedure: LEFT ATRIAL APPENDAGE OCCLUSION USING 40 MM ATRICURE ATRICLIP;  Surgeon: Kerin Perna, MD;  Location: [facility] OR;  Service: Open Heart Surgery;  Laterality: N/A;   LEFT HEART CATH AND CORONARY ANGIOGRAPHY N/A 03/15/2018   Procedure: LEFT HEART CATH AND CORONARY ANGIOGRAPHY and PCI stent;  Surgeon: Alwyn Pea, MD;  Location: [facility] INVASIVE CV LAB;  Service: Cardiovascular;  Laterality: N/A;   SVT ABLATION N/A 2009   Procedure: SVT ABLATION; Location: performed while living in [place].   TEE WITHOUT CARDIOVERSION N/A 03/22/2018   Procedure: TRANSESOPHAGEAL ECHOCARDIOGRAM (TEE);  Surgeon: Donata Clay, Theron Arista, MD;  Location: [facility] OR;  Service: Open Heart Surgery;  Laterality: N/A;   TRANSURETHRAL RESECTION OF BLADDER TUMOR N/A 07/28/2021   Procedure: TRANSURETHRAL RESECTION OF BLADDER TUMOR (TURBT)WITH  GEMCITABINE;  Surgeon: [name], MD;  Location: [facility] ORS;  Service: Urology;  Laterality: N/A;    Social History   Tobacco Use   Smoking status: Former    Packs/day: 1.00    Years: 25.00    Additional pack years: 0.00    Total pack years: 25.00    Types: Cigarettes    Quit date: 06/30/1987    Years since quitting: 35.3    Passive exposure: Never   Smokeless tobacco: Never  Vaping Use   Vaping Use: Never used  Substance Use Topics   Alcohol use: Not Currently   Drug use: Not Currently     Medication list has been reviewed and  updated.  Current Meds  Medication Sig   aspirin EC 81 MG tablet Take 81 mg by mouth daily. Swallow whole.   atorvastatin (LIPITOR) 80 MG tablet TAKE 1 TABLET(80 MG) BY MOUTH DAILY AT 6 PM   calcium carbonate (TUMS - DOSED IN MG ELEMENTAL CALCIUM) 500 MG chewable tablet Chew 1 tablet by mouth as needed for indigestion or heartburn.   finasteride (PROSCAR) 5 MG tablet Take 1 tablet (5 mg total) by mouth daily.   Multiple Vitamins-Minerals (MULTIVITAMIN WITH MINERALS) tablet Take 1 tablet by mouth daily.   Omega-3 Fatty Acids (FISH OIL PO) Take 1 tablet by mouth daily.   tamsulosin (FLOMAX) 0.4 MG CAPS capsule TAKE 1 CAPSULE(0.4 MG) BY MOUTH DAILY   traZODone (DESYREL) 50 MG tablet Take 1 tablet by mouth daily   Vibegron (GEMTESA) 75 MG TABS TAKE 1 TABLET BY MOUTH DAILY       10/19/2022    9:07 AM 08/18/2022    9:21 AM 02/13/2022    2:39 PM 08/07/2021    4:09 PM  GAD 7 : Generalized Anxiety Score  Nervous, Anxious, on Edge 0 0 0 0  Control/stop worrying 0 0 1 0  Worry too much - different things 0 0 1 0  Trouble relaxing 0 0 0 0  Restless 0 0 0 0  Easily annoyed or irritable 0 0 0 0  Afraid - awful might happen 0 0 1 0  Total GAD 7 Score 0 0 3 0  Anxiety Difficulty Not difficult at all Not difficult at all Not difficult at all Not difficult at all       10/19/2022    9:07 AM 08/18/2022    9:20 AM 02/13/2022    2:38 PM  Depression screen PHQ 2/9  Decreased Interest 1 0 0  Down, Depressed, Hopeless 1 0 1  PHQ - 2 Score 2 0 1  Altered sleeping 0 0 1  Tired, decreased energy 1 0 0  Change in appetite 0 0 0  Feeling bad or failure about yourself  0 0 0  Trouble concentrating 0 0 0  Moving slowly or fidgety/restless 0 0 0  Suicidal thoughts 0 0 0  PHQ-9 Score 3 0 2  Difficult doing work/chores Not difficult at all Not difficult at all Not difficult at all    BP Readings from Last 3 Encounters:  10/19/22 120/78  10/17/22 (!) 146/85  09/01/22 (!) 149/70    Physical  Exam Vitals and nursing note reviewed.  HENT:     Head: Normocephalic.     Right Ear: Tympanic membrane normal.     Left Ear: Tympanic membrane normal.     Nose: Nose normal. No congestion.     Mouth/Throat:     Mouth: Mucous membranes are moist.  Eyes:     Extraocular Movements:     Right eye: Normal extraocular motion.     Left eye: Normal extraocular motion.     Pupils: Pupils are equal, round, and reactive to light.     Comments: Periorbital bruising  Cardiovascular:     Rate and Rhythm: Bradycardia present. Rhythm irregular.     Pulses:          Radial pulses are 1+ on the right side and 1+ on the left side.     Heart sounds: Normal heart sounds, S1 normal and S2 normal. No murmur heard.    No systolic murmur is present.     No diastolic murmur is present.     No friction rub. No S3 or S4 sounds.  Pulmonary:     Breath sounds: No wheezing, rhonchi or rales.  Abdominal:     Tenderness: There is no abdominal tenderness.  Musculoskeletal:     Left hand: Swelling, tenderness and bony tenderness present. No deformity. Decreased range of motion. Normal pulse.     Cervical back: Normal range of motion and neck supple.     Comments: Tenderness of the third and fourth metacarpals.  There is no palpable deficit.  Neurological:     Mental Status: He is alert.     Cranial Nerves: Cranial nerves 2-12 are intact.     Sensory: Sensation is intact.     Motor: Motor function is intact.     Wt Readings from Last 3 Encounters:  10/19/22 218 lb (98.9 kg)  10/17/22 200 lb (90.7 kg)  09/01/22 216 lb (98 kg)    BP 120/78   Pulse 74   Ht 6' (1.829 m)   Wt 218 lb (98.9 kg)   SpO2 96%  BMI 29.57 kg/m   Assessment and Plan:  1. Hand pain, left Initial evaluation of left hand swelling without ecchymosis and tenderness in particular over the third and fourth metacarpals.  Patient is able to move all joints including PIP DIP and MP joints.  There is no tenderness of the left wrist.   Given the mechanism of injury and I am uncertain whether patient fell on outstretched hand or if it curled into a fist when he is hit the ground but we will obtain that x-ray to rule out fracture or dislocation.  In the meantime I have suggested localized ice over the swelling as well as 2 inch Ace wrap. - DG Hand Complete Left X-ray was reviewed with patient there is no fracture or dislocation and suggestion for pain has been for Tylenol and as a forementioned Ace wrap and localized ice. 2. Near syncope Patient had dizziness as he was near the end of his 1-1/2 to 2 mile walk and if then he he collapsed to the ground and fell face first with momentary loss of consciousness/perhaps confusion.  Patient was evaluated in the emergency room which it was noted that pulse rate was in the 60s with normal blood pressure.  Patient is on no rate lowering medication and heart rate has been in the 60s which is unusual for this age.  EKG was ordered with the following results computer rate 63 however when you look at the initial rate it is in the 40-50 rate which then compresses to a 80-100 rate.  My concern is that patient has an underlying bradycardia that goes into the 40 range which may affect his cardiac output and contribute to his dizziness particularly when trying to exercise such as walking.  EKG was read as followed: Rate is noted.  Irregular.  Intervals normal except there is no PR interval to indicate atrial fibrillation.  There is no bulge tried.  Suggesting LVH.  There is no ST-T wave changes to suggest ischemic concerns.  There is also noted an incomplete bundle branch block of the right.  My concern is that this may be something of a right concern resulting in a bradycardia which contributes to a near syncopal opportunity.  My concern that I discussed with the patient is that we need to have cardiology further evaluate given his history of coronary artery disease and chronic atrial fibrillation.  I have also  discussed the possibility that we may need to further evaluate rate concern with Holter monitoring. - EKG 12-Lead - Ambulatory referral to Cardiology  3. Longstanding persistent atrial fibrillation Chronic.  Persistent.  Stable.  As noted previously patient has a history of atrial fibrillation but is not on rate limiting no anticoagulation other than aspirin.  I do not think that patient's issue of syncope is related to her cerebrovascular concern and upon review of CT of the head noted no acute intracranial abnormality with no evidence of cranial fracture.  There was no subdural noted but there was a left frontal scalp hematoma which is consistent with the patient's injury.  Primary concern is if patient has relative bradycardia with variant ventricular rate but significant periods of time with a rate under 50. - EKG 12-Lead - Ambulatory referral to Cardiology    Elizabeth Sauer, MD

## 2022-10-20 DIAGNOSIS — R55 Syncope and collapse: Secondary | ICD-10-CM | POA: Diagnosis not present

## 2022-10-20 DIAGNOSIS — E782 Mixed hyperlipidemia: Secondary | ICD-10-CM | POA: Diagnosis not present

## 2022-10-20 DIAGNOSIS — R0602 Shortness of breath: Secondary | ICD-10-CM | POA: Diagnosis not present

## 2022-10-20 DIAGNOSIS — I2510 Atherosclerotic heart disease of native coronary artery without angina pectoris: Secondary | ICD-10-CM | POA: Diagnosis not present

## 2022-10-20 DIAGNOSIS — I214 Non-ST elevation (NSTEMI) myocardial infarction: Secondary | ICD-10-CM | POA: Diagnosis not present

## 2022-10-20 DIAGNOSIS — G4733 Obstructive sleep apnea (adult) (pediatric): Secondary | ICD-10-CM | POA: Diagnosis not present

## 2022-10-20 DIAGNOSIS — Z951 Presence of aortocoronary bypass graft: Secondary | ICD-10-CM | POA: Diagnosis not present

## 2022-10-20 DIAGNOSIS — R079 Chest pain, unspecified: Secondary | ICD-10-CM | POA: Diagnosis not present

## 2022-10-20 DIAGNOSIS — I10 Essential (primary) hypertension: Secondary | ICD-10-CM | POA: Diagnosis not present

## 2022-10-20 DIAGNOSIS — I4891 Unspecified atrial fibrillation: Secondary | ICD-10-CM | POA: Diagnosis not present

## 2022-10-20 DIAGNOSIS — I480 Paroxysmal atrial fibrillation: Secondary | ICD-10-CM | POA: Diagnosis not present

## 2022-10-21 ENCOUNTER — Ambulatory Visit (INDEPENDENT_AMBULATORY_CARE_PROVIDER_SITE_OTHER): Payer: Medicare Other

## 2022-10-21 ENCOUNTER — Other Ambulatory Visit: Payer: Self-pay | Admitting: Internal Medicine

## 2022-10-21 VITALS — Ht 72.0 in | Wt 218.0 lb

## 2022-10-21 DIAGNOSIS — I4891 Unspecified atrial fibrillation: Secondary | ICD-10-CM

## 2022-10-21 DIAGNOSIS — R079 Chest pain, unspecified: Secondary | ICD-10-CM

## 2022-10-21 DIAGNOSIS — R0602 Shortness of breath: Secondary | ICD-10-CM

## 2022-10-21 DIAGNOSIS — Z0000 Encounter for general adult medical examination without abnormal findings: Secondary | ICD-10-CM | POA: Diagnosis not present

## 2022-10-21 DIAGNOSIS — R55 Syncope and collapse: Secondary | ICD-10-CM

## 2022-10-21 NOTE — Progress Notes (Signed)
I connected with  Curt Bears on 10/21/22 by a audio enabled telemedicine application and verified that I am speaking with the correct person using two identifiers.  Patient Location: Home  Provider Location: Office/Clinic  I discussed the limitations of evaluation and management by telemedicine. The patient expressed understanding and agreed to proceed.  Subjective:   David Mccullough is a 77 y.o. male who presents for Medicare Annual/Subsequent preventive examination.  Review of Systems     Cardiac Risk Factors include: advanced age (>12men, >22 women);dyslipidemia;male gender     Objective:    There were no vitals filed for this visit. There is no height or weight on file to calculate BMI.     10/21/2022   10:32 AM 10/17/2022   11:20 AM 09/01/2022    1:49 PM 02/25/2022   10:30 AM 10/15/2021    9:45 AM 09/08/2021    7:46 AM 08/19/2021   11:32 AM  Advanced Directives  Does Patient Have a Medical Advance Directive? No No Yes Yes Yes Yes Yes  Type of Surveyor, minerals;Living will  Healthcare Power of [place];Living will Healthcare Power of [place];Living will Healthcare Power of [place];Living will  Does patient want to make changes to medical advance directive?   No - Patient declined   No - Guardian declined No - Patient declined  Copy of Healthcare Power of Attorney in Chart?   No - copy requested  No - copy requested No - copy requested No - copy requested  Would patient like information on creating a medical advance directive? No - Patient declined No - Patient declined No - Patient declined   No - Patient declined No - Patient declined    Current Medications (verified) Outpatient Encounter Medications as of 10/21/2022  Medication Sig   atorvastatin (LIPITOR) 80 MG tablet TAKE 1 TABLET(80 MG) BY MOUTH DAILY AT 6 PM   calcium carbonate (TUMS - DOSED IN MG ELEMENTAL CALCIUM) 500 MG chewable tablet Chew 1 tablet by mouth as needed for indigestion  or heartburn.   finasteride (PROSCAR) 5 MG tablet Take 1 tablet (5 mg total) by mouth daily.   Multiple Vitamins-Minerals (MULTIVITAMIN WITH MINERALS) tablet Take 1 tablet by mouth daily.   Omega-3 Fatty Acids (FISH OIL PO) Take 1 tablet by mouth daily.   tamsulosin (FLOMAX) 0.4 MG CAPS capsule TAKE 1 CAPSULE(0.4 MG) BY MOUTH DAILY   traZODone (DESYREL) 50 MG tablet Take 1 tablet by mouth daily   Vibegron (GEMTESA) 75 MG TABS TAKE 1 TABLET BY MOUTH DAILY   aspirin EC 81 MG tablet Take 81 mg by mouth daily. Swallow whole. (Patient not taking: Reported on 10/21/2022)   No facility-administered encounter medications on file as of 10/21/2022.    Allergies (verified) Patient has no known allergies.   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   Colon polyps    Depression    Elevated PSA    Enlarged prostate    GERD (gastroesophageal reflux disease)    Hyperlipidemia    Hypertension  Nephrolithiasis    NSTEMI (non-ST elevated myocardial infarction) 03/14/2018   a.) LHC 03/15/2018 --> 80% pLAD, 99% OM1, 100% pRCA --> failed PCI and Tx'd to [facility] for CVTS consult; underwent 3v CABG (LIMA-LAD, SVG-OM, SVG-RI) on 03/25/2018.   Persistent atrial fibrillation    a.) CHA2DS2-VASc = 4 (age x2, HTN, prior MI). b.) rate/rhythm maintained without pharmacological intervention; no daily anticoagulation.   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 b.) CNB (+) for RCC, conventional clear cell type, nuclear  grade II. 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 d.) PET CT 07/31/2021 revealed FDG avid LEFT renal mass (SUV 4.12); scheduled for nephrectomy.   Right lower lobe pulmonary nodule 05/27/2021   a.) CT chest --> measured 9 mm.   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   a.) 3v CABG: LIMA-LAD, SVG-OM, SVG-RI   SVT (supraventricular tachycardia) 2009   a.) s/p SVT ablation while living in [place]   Past Surgical History:  Procedure Laterality Date   COLONOSCOPY     CORONARY ARTERY BYPASS GRAFT N/A 03/22/2018   Procedure: CORONARY ARTERY BYPASS GRAFTING (CABG) x 3 WITH ENDOSCOPIC HARVESTING OF RIGHT GREATER SAPHENOUS VEIN;  Surgeon: Kerin Perna, MD;  Location: [facility] OR;  Service: Open Heart Surgery;  Laterality: N/A;   CORONARY STENT INTERVENTION N/A 03/16/2018   Procedure: CORONARY STENT INTERVENTION;  Surgeon: Alwyn Pea, MD;  Location: [facility] INVASIVE CV LAB;  Service: Cardiovascular;  Laterality: N/A;   LAPAROSCOPIC NEPHRECTOMY, HAND ASSISTED Left 09/08/2021   Procedure: HAND ASSISTED LAPAROSCOPIC NEPHRECTOMY;  Surgeon: [name], MD;  Location: [facility] ORS;  Service: Urology;  Laterality: Left;   LEFT ATRIAL APPENDAGE OCCLUSION N/A 03/22/2018   Procedure: LEFT ATRIAL APPENDAGE OCCLUSION USING 40 MM ATRICURE ATRICLIP;  Surgeon: Kerin Perna, MD;  Location: [facility];  Service: Open Heart Surgery;  Laterality: N/A;   LEFT HEART CATH AND CORONARY ANGIOGRAPHY N/A 03/15/2018   Procedure: LEFT HEART CATH AND CORONARY ANGIOGRAPHY and PCI stent;  Surgeon: Alwyn Pea, MD;  Location: [facility] INVASIVE CV LAB;  Service: Cardiovascular;  Laterality: N/A;   SVT ABLATION N/A 2009   Procedure: SVT ABLATION; Location: performed while living in [place].   TEE WITHOUT CARDIOVERSION N/A 03/22/2018   Procedure: TRANSESOPHAGEAL ECHOCARDIOGRAM (TEE);  Surgeon: Donata Clay, Theron Arista, MD;  Location: [facility] OR;  Service: Open Heart  Surgery;  Laterality: N/A;   TRANSURETHRAL RESECTION OF BLADDER TUMOR N/A 07/28/2021   Procedure: TRANSURETHRAL RESECTION OF BLADDER TUMOR (TURBT)WITH  GEMCITABINE;  Surgeon: [name], MD;  Location: [facility] ORS;  Service: Urology;  Laterality: N/A;   Family History  Problem Relation Age of Onset   Heart disease Mother    Heart disease Father    Arrhythmia Sister    Heart disease Sister    Social History   Socioeconomic History   Marital status: Married    Spouse name: Not on file   Number of children: 3   Years of education: Not on file   Highest education level: Not on file  Occupational History   Not on file  Tobacco Use   Smoking status: Former    Packs/day: 1.00    Years: 25.00    Additional pack years: 0.00    Total pack years: 25.00    Types: Cigarettes    Quit date: 06/30/1987    Years since quitting: 35.3  Passive exposure: Never   Smokeless tobacco: Never  Vaping Use   Vaping Use: Never used  Substance and Sexual Activity   Alcohol use: Not Currently   Drug use: Not Currently   Sexual activity: Not Currently  Other Topics Concern   Not on file  Social History Narrative   Not on file   Social Determinants of Health   Financial Resource Strain: Low Risk  (10/21/2022)   Overall Financial Resource Strain (CARDIA)    Difficulty of Paying Living Expenses: Not hard at all  Food Insecurity: No Food Insecurity (10/21/2022)   Hunger Vital Sign    Worried About Running Out of Food in the Last Year: Never true    Ran Out of Food in the Last Year: Never true  Transportation Needs: No Transportation Needs (10/21/2022)   PRAPARE - Administrator, Civil Service (Medical): No    Lack of Transportation (Non-Medical): No  Physical Activity: Insufficiently Active (10/21/2022)   Exercise Vital Sign    Days of Exercise per Week: 2 days    Minutes of Exercise per Session: 30 min  Stress: No Stress Concern Present (10/21/2022)   [employer] of  Occupational Health - Occupational Stress Questionnaire    Feeling of Stress : Not at all  Social Connections: Moderately Integrated (10/21/2022)   Social Connection and Isolation Panel [NHANES]    Frequency of Communication with Friends and Family: Twice a week    Frequency of Social Gatherings with Friends and Family: Once a week    Attends Religious Services: More than 4 times per year    Active Member of [employer] or Organizations: No    Attends Engineer, structural: Never    Marital Status: Married    Tobacco Counseling Counseling given: Not Answered   Clinical Intake:  Pre-visit preparation completed: Yes  Pain : No/denies pain     Nutritional Risks: None Diabetes: No  How often do you need to have someone help you when you read instructions, pamphlets, or other written materials from your doctor or pharmacy?: 1 - Never  Diabetic?no  Interpreter Needed?: No  Information entered by :: Kennedy Bucker, LPN   Activities of Daily Living    10/21/2022   10:33 AM  In your present state of health, do you have any difficulty performing the following activities:  Hearing? 0  Vision? 0  Difficulty concentrating or making decisions? 0  Walking or climbing stairs? 0  Dressing or bathing? 0  Doing errands, shopping? 0  Preparing Food and eating ? N  Using the Toilet? N  In the past six months, have you accidently leaked urine? N  Do you have problems with loss of bowel control? N  Managing your Medications? N  Managing your Finances? N  Housekeeping or managing your Housekeeping? N    Patient Care Team: Duanne Limerick, MD as PCP - General (Family Medicine) [name], MD as Consulting Physician (Urology) Alwyn Pea, MD as Consulting Physician (Cardiology) Jeralyn Ruths, MD as Consulting Physician (Oncology) Kemper Durie, RN as [facility] Care Management  Indicate any recent Medical Services you may have received from other  than [facility] providers in the past year (date may be approximate).     Assessment:   This is a routine wellness examination for [place].  Hearing/Vision screen Hearing Screening - Comments:: No aids Vision Screening - Comments:: Wears glasses- Dr.King  Dietary issues and exercise activities discussed: Current Exercise Habits: Home exercise routine, Type  of exercise: walking, Time (Minutes): 30, Frequency (Times/Week): 3, Weekly Exercise (Minutes/Week): 90, Intensity: Mild   Goals Addressed             This Visit's Progress    DIET - EAT MORE FRUITS AND VEGETABLES         Depression Screen    10/21/2022   10:31 AM 10/19/2022    9:07 AM 08/18/2022    9:20 AM 02/13/2022    2:38 PM 10/15/2021    9:43 AM 08/07/2021    4:09 PM 10/09/2020    8:48 AM  PHQ 2/9 Scores  PHQ - 2 Score 0 2 0 1 2 0 1  PHQ- 9 Score 0 3 0 2 5 0 1    Fall Risk    10/21/2022   10:33 AM 10/19/2022    9:08 AM 08/18/2022    9:21 AM 02/13/2022    2:38 PM 10/15/2021    9:45 AM  Fall Risk   Falls in the past year? 1 1 0 0 0  Number falls in past yr: 0 0 0 0 0  Injury with Fall? 0 1 0 0 0  Risk for fall due to : History of fall(s) No Fall Risks No Fall Risks No Fall Risks No Fall Risks  Follow up Falls evaluation completed;Falls prevention discussed Falls evaluation completed Falls evaluation completed Falls evaluation completed Falls prevention discussed    FALL RISK PREVENTION PERTAINING TO THE HOME:  Any stairs in or around the home? No  If so, are there any without handrails? No  Home free of loose throw rugs in walkways, pet beds, electrical cords, etc? Yes  Adequate lighting in your home to reduce risk of falls? Yes   ASSISTIVE DEVICES UTILIZED TO PREVENT FALLS:  Life alert? No  Use of a cane, walker or w/c? No  Grab bars in the bathroom? No  Shower chair or bench in shower? No  Elevated toilet seat or a handicapped toilet? No   Cognitive Function:        10/21/2022   10:36 AM  6CIT Screen   What Year? 0 points  What month? 0 points  What time? 3 points  Count back from 20 0 points  Months in reverse 0 points  Repeat phrase 0 points  Total Score 3 points    Immunizations Immunization History  Administered Date(s) Administered   Influenza, High Dose Seasonal PF 04/14/2018   Influenza-Unspecified 03/27/2019, 03/29/2021, 04/20/2022   PFIZER(Purple Top)SARS-COV-2 Vaccination 08/11/2019, 09/06/2019, 03/29/2020   Pneumococcal Conjugate-13 02/05/2015   Pneumococcal Polysaccharide-23 01/29/2020   Tdap 10/17/2022    TDAP status: Up to date  Flu Vaccine status: Up to date  Pneumococcal vaccine status: Up to date  Covid-19 vaccine status: Completed vaccines  Qualifies for Shingles Vaccine? Yes   Zostavax completed No   Shingrix Completed?: No.    Education has been provided regarding the importance of this vaccine. Patient has been advised to call insurance company to determine out of pocket expense if they have not yet received this vaccine. Advised may also receive vaccine at local pharmacy or Health Dept. Verbalized acceptance and understanding.  Screening Tests Health Maintenance  Topic Date Due   Zoster Vaccines- Shingrix (1 of 2) Never done   COVID-19 Vaccine (4 - 2023-24 season) 02/27/2022   INFLUENZA VACCINE  01/28/2023   Medicare Annual Wellness (AWV)  10/21/2023   DTaP/Tdap/Td (2 - Td or Tdap) 10/16/2032   Pneumonia Vaccine 51+ Years old  Completed   Hepatitis  C Screening  Completed   HPV VACCINES  Aged Out   COLONOSCOPY (Pts 45-41yrs Insurance coverage will need to be confirmed)  Discontinued    Health Maintenance  Health Maintenance Due  Topic Date Due   Zoster Vaccines- Shingrix (1 of 2) Never done   COVID-19 Vaccine (4 - 2023-24 season) 02/27/2022   States had Shingles shot but doesn't know when  Colorectal cancer screening: No longer required.   Lung Cancer Screening: (Low Dose CT Chest recommended if Age 10-80 years, 30 pack-year currently  smoking OR have quit w/in 15years.) does not qualify.    Additional Screening:  Hepatitis C Screening: does qualify; Completed 08/18/22  Vision Screening: Recommended annual ophthalmology exams for early detection of glaucoma and other disorders of the eye. Is the patient up to date with their annual eye exam?  Yes  Who is the provider or what is the name of the office in which the patient attends annual eye exams? Dr.King If pt is not established with a provider, would they like to be referred to a provider to establish care? No .   Dental Screening: Recommended annual dental exams for proper oral hygiene  Community Resource Referral / Chronic Care Management: CRR required this visit?  No   CCM required this visit?  No      Plan:     I have personally reviewed and noted the following in the patient's chart:   Medical and social history Use of alcohol, tobacco or illicit drugs  Current medications and supplements including opioid prescriptions. Patient is not currently taking opioid prescriptions. Functional ability and status Nutritional status Physical activity Advanced directives List of other physicians Hospitalizations, surgeries, and ER visits in previous 12 months Vitals Screenings to include cognitive, depression, and falls Referrals and appointments  In addition, I have reviewed and discussed with patient certain preventive protocols, quality metrics, and best practice recommendations. A written personalized care plan for preventive services as well as general preventive health recommendations were provided to patient.     Hal Hope, LPN   10/05/8117   Nurse Notes: none

## 2022-10-21 NOTE — Patient Instructions (Signed)
David Mccullough , Thank you for taking time to come for your Medicare Wellness Visit. I appreciate your ongoing commitment to your health goals. Please review the following plan we discussed and let me know if I can assist you in the future.   These are the goals we discussed:  Goals      DIET - EAT MORE FRUITS AND VEGETABLES     DIET - INCREASE WATER INTAKE     Recommend drinking 6-8 glasses of water per day      Increase physical activity     Recommend increasing physical activity to at least 3 days per week        This is a list of the screening recommended for you and due dates:  Health Maintenance  Topic Date Due   Zoster (Shingles) Vaccine (1 of 2) Never done   COVID-19 Vaccine (4 - 2023-24 season) 02/27/2022   Flu Shot  01/28/2023   Medicare Annual Wellness Visit  10/21/2023   DTaP/Tdap/Td vaccine (2 - Td or Tdap) 10/16/2032   Pneumonia Vaccine  Completed   Hepatitis C Screening: USPSTF Recommendation to screen - Ages 83-79 yo.  Completed   HPV Vaccine  Aged Out   Colon Cancer Screening  Discontinued    Advanced directives: no  Conditions/risks identified: none  Next appointment: Follow up in one year for your annual wellness visit. 10/27/23 @ 10:15 am by phone  Preventive Care 65 Years and Older, Male  Preventive care refers to lifestyle choices and visits with your health care provider that can promote health and wellness. What does preventive care include? A yearly physical exam. This is also called an annual well check. Dental exams once or twice a year. Routine eye exams. Ask your health care provider how often you should have your eyes checked. Personal lifestyle choices, including: Daily care of your teeth and gums. Regular physical activity. Eating a healthy diet. Avoiding tobacco and drug use. Limiting alcohol use. Practicing safe sex. Taking low doses of aspirin every day. Taking vitamin and mineral supplements as recommended by your health care  provider. What happens during an annual well check? The services and screenings done by your health care provider during your annual well check will depend on your age, overall health, lifestyle risk factors, and family history of disease. Counseling  Your health care provider may ask you questions about your: Alcohol use. Tobacco use. Drug use. Emotional well-being. Home and relationship well-being. Sexual activity. Eating habits. History of falls. Memory and ability to understand (cognition). Work and work Astronomer. Screening  You may have the following tests or measurements: Height, weight, and BMI. Blood pressure. Lipid and cholesterol levels. These may be checked every 5 years, or more frequently if you are over 50 years old. Skin check. Lung cancer screening. You may have this screening every year starting at age 82 if you have a 30-pack-year history of smoking and currently smoke or have quit within the past 15 years. Fecal occult blood test (FOBT) of the stool. You may have this test every year starting at age 23. Flexible sigmoidoscopy or colonoscopy. You may have a sigmoidoscopy every 5 years or a colonoscopy every 10 years starting at age 67. Prostate cancer screening. Recommendations will vary depending on your family history and other risks. Hepatitis C blood test. Hepatitis B blood test. Sexually transmitted disease (STD) testing. Diabetes screening. This is done by checking your blood sugar (glucose) after you have not eaten for a while (fasting). You may  have this done every 1-3 years. Abdominal aortic aneurysm (AAA) screening. You may need this if you are a current or former smoker. Osteoporosis. You may be screened starting at age 38 if you are at high risk. Talk with your health care provider about your test results, treatment options, and if necessary, the need for more tests. Vaccines  Your health care provider may recommend certain vaccines, such  as: Influenza vaccine. This is recommended every year. Tetanus, diphtheria, and acellular pertussis (Tdap, Td) vaccine. You may need a Td booster every 10 years. Zoster vaccine. You may need this after age 86. Pneumococcal 13-valent conjugate (PCV13) vaccine. One dose is recommended after age 60. Pneumococcal polysaccharide (PPSV23) vaccine. One dose is recommended after age 91. Talk to your health care provider about which screenings and vaccines you need and how often you need them. This information is not intended to replace advice given to you by your health care provider. Make sure you discuss any questions you have with your health care provider. Document Released: 07/12/2015 Document Revised: 03/04/2016 Document Reviewed: 04/16/2015 Elsevier Interactive Patient Education  2017 [place] Prevention in the Home Falls can cause injuries. They can happen to people of all ages. There are many things you can do to make your home safe and to help prevent falls. What can I do on the outside of my home? Regularly fix the edges of walkways and driveways and fix any cracks. Remove anything that might make you trip as you walk through a door, such as a raised step or threshold. Trim any bushes or trees on the path to your home. Use bright outdoor lighting. Clear any walking paths of anything that might make someone trip, such as rocks or tools. Regularly check to see if handrails are loose or broken. Make sure that both sides of any steps have handrails. Any raised decks and porches should have guardrails on the edges. Have any leaves, snow, or ice cleared regularly. Use sand or salt on walking paths during winter. Clean up any spills in your garage right away. This includes oil or grease spills. What can I do in the bathroom? Use night lights. Install grab bars by the toilet and in the tub and shower. Do not use towel bars as grab bars. Use non-skid mats or decals in the tub or  shower. If you need to sit down in the shower, use a plastic, non-slip stool. Keep the floor dry. Clean up any water that spills on the floor as soon as it happens. Remove soap buildup in the tub or shower regularly. Attach bath mats securely with double-sided non-slip rug tape. Do not have throw rugs and other things on the floor that can make you trip. What can I do in the bedroom? Use night lights. Make sure that you have a light by your bed that is easy to reach. Do not use any sheets or blankets that are too big for your bed. They should not hang down onto the floor. Have a firm chair that has side arms. You can use this for support while you get dressed. Do not have throw rugs and other things on the floor that can make you trip. What can I do in the kitchen? Clean up any spills right away. Avoid walking on wet floors. Keep items that you use a lot in easy-to-reach places. If you need to reach something above you, use a strong step stool that has a grab bar. Keep electrical cords  out of the way. Do not use floor polish or wax that makes floors slippery. If you must use wax, use non-skid floor wax. Do not have throw rugs and other things on the floor that can make you trip. What can I do with my stairs? Do not leave any items on the stairs. Make sure that there are handrails on both sides of the stairs and use them. Fix handrails that are broken or loose. Make sure that handrails are as long as the stairways. Check any carpeting to make sure that it is firmly attached to the stairs. Fix any carpet that is loose or worn. Avoid having throw rugs at the top or bottom of the stairs. If you do have throw rugs, attach them to the floor with carpet tape. Make sure that you have a light switch at the top of the stairs and the bottom of the stairs. If you do not have them, ask someone to add them for you. What else can I do to help prevent falls? Wear shoes that: Do not have high heels. Have  rubber bottoms. Are comfortable and fit you well. Are closed at the toe. Do not wear sandals. If you use a stepladder: Make sure that it is fully opened. Do not climb a closed stepladder. Make sure that both sides of the stepladder are locked into place. Ask someone to hold it for you, if possible. Clearly mark and make sure that you can see: Any grab bars or handrails. First and last steps. Where the edge of each step is. Use tools that help you move around (mobility aids) if they are needed. These include: Canes. Walkers. Scooters. Crutches. Turn on the lights when you go into a dark area. Replace any light bulbs as soon as they burn out. Set up your furniture so you have a clear path. Avoid moving your furniture around. If any of your floors are uneven, fix them. If there are any pets around you, be aware of where they are. Review your medicines with your doctor. Some medicines can make you feel dizzy. This can increase your chance of falling. Ask your doctor what other things that you can do to help prevent falls. This information is not intended to replace advice given to you by your health care provider. Make sure you discuss any questions you have with your health care provider. Document Released: 04/11/2009 Document Revised: 11/21/2015 Document Reviewed: 07/20/2014 Elsevier Interactive Patient Education  2017 [employer].

## 2022-10-23 DIAGNOSIS — R42 Dizziness and giddiness: Secondary | ICD-10-CM | POA: Diagnosis not present

## 2022-10-23 DIAGNOSIS — R55 Syncope and collapse: Secondary | ICD-10-CM | POA: Diagnosis not present

## 2022-10-23 DIAGNOSIS — Z8679 Personal history of other diseases of the circulatory system: Secondary | ICD-10-CM | POA: Diagnosis not present

## 2022-10-26 ENCOUNTER — Other Ambulatory Visit: Payer: Self-pay | Admitting: Student

## 2022-10-26 DIAGNOSIS — I480 Paroxysmal atrial fibrillation: Secondary | ICD-10-CM | POA: Diagnosis not present

## 2022-10-26 DIAGNOSIS — R55 Syncope and collapse: Secondary | ICD-10-CM | POA: Diagnosis not present

## 2022-10-26 DIAGNOSIS — G4719 Other hypersomnia: Secondary | ICD-10-CM | POA: Diagnosis not present

## 2022-10-26 DIAGNOSIS — G479 Sleep disorder, unspecified: Secondary | ICD-10-CM | POA: Diagnosis not present

## 2022-10-26 DIAGNOSIS — J439 Emphysema, unspecified: Secondary | ICD-10-CM | POA: Diagnosis not present

## 2022-10-26 DIAGNOSIS — R0602 Shortness of breath: Secondary | ICD-10-CM | POA: Diagnosis not present

## 2022-10-30 DIAGNOSIS — R55 Syncope and collapse: Secondary | ICD-10-CM | POA: Diagnosis not present

## 2022-10-30 DIAGNOSIS — I4891 Unspecified atrial fibrillation: Secondary | ICD-10-CM | POA: Diagnosis not present

## 2022-10-31 ENCOUNTER — Ambulatory Visit
Admission: RE | Admit: 2022-10-31 | Discharge: 2022-10-31 | Disposition: A | Discharge status: Home / Self Care | Payer: Medicare Other | Source: Ambulatory Visit | Attending: Student | Admitting: Student

## 2022-10-31 DIAGNOSIS — R55 Syncope and collapse: Secondary | ICD-10-CM | POA: Diagnosis not present

## 2022-11-06 ENCOUNTER — Ambulatory Visit (INDEPENDENT_AMBULATORY_CARE_PROVIDER_SITE_OTHER): Payer: Medicare Other | Admitting: Urology

## 2022-11-06 ENCOUNTER — Other Ambulatory Visit
Admission: RE | Admit: 2022-11-06 | Discharge: 2022-11-06 | Disposition: A | Discharge status: Home / Self Care | Payer: Medicare Other | Attending: Urology | Admitting: Urology

## 2022-11-06 ENCOUNTER — Other Ambulatory Visit: Payer: Self-pay

## 2022-11-06 VITALS — BP 149/87 | HR 84

## 2022-11-06 DIAGNOSIS — H2513 Age-related nuclear cataract, bilateral: Secondary | ICD-10-CM | POA: Diagnosis not present

## 2022-11-06 DIAGNOSIS — Z8551 Personal history of malignant neoplasm of bladder: Secondary | ICD-10-CM

## 2022-11-06 DIAGNOSIS — N138 Other obstructive and reflux uropathy: Secondary | ICD-10-CM

## 2022-11-06 DIAGNOSIS — C642 Malignant neoplasm of left kidney, except renal pelvis: Secondary | ICD-10-CM

## 2022-11-06 DIAGNOSIS — H43813 Vitreous degeneration, bilateral: Secondary | ICD-10-CM | POA: Diagnosis not present

## 2022-11-06 DIAGNOSIS — Z85528 Personal history of other malignant neoplasm of kidney: Secondary | ICD-10-CM | POA: Diagnosis not present

## 2022-11-06 DIAGNOSIS — C679 Malignant neoplasm of bladder, unspecified: Secondary | ICD-10-CM

## 2022-11-06 NOTE — Progress Notes (Unsigned)
   11/06/22 CC:  Chief Complaint  Patient presents with   Cysto    Cystoscopy       HPI: David Mccullough is a 77 y.o. male  with a personal history of renal cell carcinoma of left kidney, malignant neoplasm of urinary bladder,urinary urgency, incomplete bladder emptying, elevated PSA, microscopic hematuria, and pulmonary nodule. He presents today for a cystostopy.   Renal biopsy on 06/26/2021 surgical pathology revealed renal cell carcinoma, conventional clear-cell type, nuclear grade 2.   He is s/p TURBT with instillation of intravesical chemotherapy on 07/28/2021.Intraoperative findings: Flat broad-based superficial appearing 1.5 cm papillary left posterior bladder wall tumor consistent with transitional cell carcinoma.  No other lesions in the bladder identified.  Mildly trabeculated bladder.   Surgical pathology of bladder specimen consistent with  non-invasive papillary urothelial carcinoma, low grade.   He is s/p hand-assisted laparoscopic left radical nephrectomy on 09/08/2021.    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 [id number].  He is being followed with serial imaging with Dr. Orlie Dakin.  CT from 2/14 ago was negative for any evidence of recurrence.  Nonspecific pulmonary nodule also being followed- stable.  NED. A&Ox3.   No respiratory distress   Abd soft, NT, ND Normal phallus with bilateral descended testicles  Cystoscopy Procedure Note  Patient identification was confirmed, informed consent was obtained, and patient was prepped using Betadine solution.  Lidocaine jelly was administered per urethral meatus.     Pre-Procedure: - Inspection reveals a normal caliber ureteral meatus.  Procedure: The flexible cystoscope was introduced without difficulty - No urethral strictures/lesions are present. - Enlarged prostate bilobar coaptation  - Normal bladder neck - Bilateral ureteral orifices identified - Bladder mucosa  reveals no  ulcers, tumors, or lesions - No bladder stones - No trabeculation - Previous TURBT site appreciated, on the left lateral bladder wall.  In the very center of this area, there is some newly appreciated very subtle texture changes but no overt tumor.   Retroflexion shows ned    Post-Procedure: - Patient tolerated the procedure well   Assessment/ Plan:  Bladder cancer TCC  -No obvious disease today although concerned that there may be some early recurrence in the middle of the stellate scar.  Will plan for closer follow-up, cystoscopy in 4 months to reassess.  He is agreeable this plan.  2. Renal cell carcinoma  - Will undergo surveillance imaging with Dr Orlie Dakin at 6 month interval     3. BPH with outlet obstruction  -Complaining of increased frequency today.  Will have him return specifically to address these concerns along with UA/IPSS/PVR.  Follow-up cystoscopy in 4  months    David Scotland, MD

## 2022-11-11 ENCOUNTER — Other Ambulatory Visit: Payer: Self-pay | Admitting: Urology

## 2022-11-11 DIAGNOSIS — R3915 Urgency of urination: Secondary | ICD-10-CM

## 2022-11-12 DIAGNOSIS — I214 Non-ST elevation (NSTEMI) myocardial infarction: Secondary | ICD-10-CM | POA: Diagnosis not present

## 2022-11-12 DIAGNOSIS — R55 Syncope and collapse: Secondary | ICD-10-CM | POA: Diagnosis not present

## 2022-11-12 DIAGNOSIS — R079 Chest pain, unspecified: Secondary | ICD-10-CM | POA: Diagnosis not present

## 2022-11-12 DIAGNOSIS — I6523 Occlusion and stenosis of bilateral carotid arteries: Secondary | ICD-10-CM | POA: Diagnosis not present

## 2022-11-12 DIAGNOSIS — I4891 Unspecified atrial fibrillation: Secondary | ICD-10-CM | POA: Diagnosis not present

## 2022-11-13 ENCOUNTER — Ambulatory Visit: Payer: Medicare Other | Admitting: Urology

## 2022-11-16 ENCOUNTER — Ambulatory Visit: Payer: Medicare Other | Admitting: Urology

## 2022-11-17 ENCOUNTER — Telehealth (HOSPITAL_COMMUNITY): Payer: Self-pay | Admitting: *Deleted

## 2022-11-17 ENCOUNTER — Encounter (HOSPITAL_COMMUNITY): Payer: Self-pay

## 2022-11-17 NOTE — Telephone Encounter (Signed)
Attempted to call patient regarding upcoming cardiac MRI appointment. Left message on voicemail with name and callback number  [name] RN Navigator Cardiac Imaging [facility] [phone] Office [phone] Cell  

## 2022-11-18 ENCOUNTER — Other Ambulatory Visit: Payer: Self-pay | Admitting: Internal Medicine

## 2022-11-18 ENCOUNTER — Ambulatory Visit
Admission: RE | Admit: 2022-11-18 | Discharge: 2022-11-18 | Disposition: A | Discharge status: Home / Self Care | Payer: Medicare Other | Source: Ambulatory Visit | Attending: Internal Medicine | Admitting: Internal Medicine

## 2022-11-18 DIAGNOSIS — R079 Chest pain, unspecified: Secondary | ICD-10-CM | POA: Insufficient documentation

## 2022-11-18 DIAGNOSIS — R0602 Shortness of breath: Secondary | ICD-10-CM | POA: Diagnosis not present

## 2022-11-18 DIAGNOSIS — I4891 Unspecified atrial fibrillation: Secondary | ICD-10-CM | POA: Insufficient documentation

## 2022-11-18 DIAGNOSIS — R55 Syncope and collapse: Secondary | ICD-10-CM | POA: Insufficient documentation

## 2022-11-18 MED ORDER — GADOBUTROL 1 MMOL/ML IV SOLN
13.0000 mL | Freq: Once | INTRAVENOUS | Status: AC | PRN
  Administered 2022-11-18: 13 mL via INTRAVENOUS

## 2022-11-27 ENCOUNTER — Ambulatory Visit: Payer: Medicare Other | Admitting: Urology

## 2022-12-07 DIAGNOSIS — I480 Paroxysmal atrial fibrillation: Secondary | ICD-10-CM | POA: Diagnosis not present

## 2022-12-07 DIAGNOSIS — R0602 Shortness of breath: Secondary | ICD-10-CM | POA: Diagnosis not present

## 2022-12-07 DIAGNOSIS — G479 Sleep disorder, unspecified: Secondary | ICD-10-CM | POA: Diagnosis not present

## 2022-12-07 DIAGNOSIS — R911 Solitary pulmonary nodule: Secondary | ICD-10-CM | POA: Diagnosis not present

## 2022-12-07 DIAGNOSIS — R0609 Other forms of dyspnea: Secondary | ICD-10-CM | POA: Diagnosis not present

## 2022-12-07 DIAGNOSIS — E663 Overweight: Secondary | ICD-10-CM | POA: Diagnosis not present

## 2022-12-09 ENCOUNTER — Ambulatory Visit (INDEPENDENT_AMBULATORY_CARE_PROVIDER_SITE_OTHER): Payer: Medicare Other | Admitting: Urology

## 2022-12-09 VITALS — BP 175/92 | HR 81 | Ht 72.0 in | Wt 213.5 lb

## 2022-12-09 DIAGNOSIS — Z01818 Encounter for other preprocedural examination: Secondary | ICD-10-CM | POA: Diagnosis not present

## 2022-12-09 DIAGNOSIS — N401 Enlarged prostate with lower urinary tract symptoms: Secondary | ICD-10-CM

## 2022-12-09 DIAGNOSIS — Z8546 Personal history of malignant neoplasm of prostate: Secondary | ICD-10-CM | POA: Diagnosis not present

## 2022-12-09 DIAGNOSIS — R350 Frequency of micturition: Secondary | ICD-10-CM

## 2022-12-09 DIAGNOSIS — Z8551 Personal history of malignant neoplasm of bladder: Secondary | ICD-10-CM | POA: Diagnosis not present

## 2022-12-09 LAB — URINALYSIS, COMPLETE
Bilirubin, UA: NEGATIVE
Glucose, UA: NEGATIVE
Ketones, UA: NEGATIVE
Leukocytes,UA: NEGATIVE
Nitrite, UA: NEGATIVE
Protein,UA: NEGATIVE
Specific Gravity, UA: 1.01 (ref 1.005–1.030)
Urobilinogen, Ur: 0.2 mg/dL (ref 0.2–1.0)
pH, UA: 6.5 (ref 5.0–7.5)

## 2022-12-09 LAB — MICROSCOPIC EXAMINATION: Bacteria, UA: NONE SEEN

## 2022-12-09 LAB — BLADDER SCAN AMB NON-IMAGING: Scan Result: 84

## 2022-12-09 NOTE — Progress Notes (Signed)
I,Amy L Pierron,acting as a scribe for [name], MD.,have documented all relevant documentation on the behalf of [name], MD,as directed by  [name], MD while in the presence of [name], MD.  12/09/2022 10:43 AM   David Mccullough 1946-03-05 [id number]  Referring provider: Duanne Limerick, MD [address] [place],  [place] [address]  Chief Complaint  Patient presents with   Urinary Frequency    HPI: 77 year-old male with a personal history of prostate cancer and renal cell carcinoma returns today for follow-up regarding his urinary frequency.  He had a cystoscopy on 11/06/2022 that showed prostamegaly, no obvious tumor recurrence. Estimated prostate volume, based on CT scan, dimensions of his prostate are 6.3 by 5.7 by 5.2 cm. Calculated volume of 97 cc's.  He's undergoing surveillance imaging for his renal cell carcinoma with Dr. Orlie Dakin.   He continues to have significant issues with urinary frequency. Managed on Gemtesa, Flomax, and Finasteride.   He doesn't think the Leslye Peer has improved anything since he started taking it. He is unhappy with his urinary symptoms and ready to do something to improve them.    IPSS     Row Name 12/09/22 0900         International Prostate Symptom Score   How often have you had the sensation of not emptying your bladder? About half the time     How often have you had to urinate less than every two hours? More than half the time     How often have you found you stopped and started again several times when you urinated? About half the time     How often have you found it difficult to postpone urination? Almost always     How often have you had a weak urinary stream? About half the time     How often have you had to strain to start urination? Less than half the time     How many times did you typically get up at night to urinate? 2 Times     Total IPSS Score 22       Quality of Life due to urinary symptoms    If you were to spend the rest of your life with your urinary condition just the way it is now how would you feel about that? Terrible            Score:  1-7 Mild 8-19 Moderate 20-35 Severe Results for orders placed or performed in visit on 12/09/22  BLADDER SCAN AMB NON-IMAGING  Result Value Ref Range   Scan Result 84 ml     PMH: Past Medical History:  Diagnosis Date   Aortic atherosclerosis (HCC)    Bladder tumor 07/03/2021   a.) cystoscopy revealed 1.5 cm relatively flat papillary tumor lateral to UO (LEFT); felt to be consistent with TCC   CAD (coronary artery disease) 03/15/2018   a.) NSTEMI 03/14/2018 --> LHC --> EF 50% with inferior HK; 80% pLAD, 99% OM1, 100% pRCA --> failed PCI (TIMI-1 reduced to TIMI-0); transferred to [facility] and underwent 3v CABG (LIMA-LAD, SVG-OM, SVG-RI) on 03/22/2018.   Colon polyps    Depression    Elevated PSA    Enlarged prostate    GERD (gastroesophageal reflux disease)    Hyperlipidemia    Hypertension    Nephrolithiasis    NSTEMI (non-ST elevated myocardial infarction) (HCC) 03/14/2018   a.) LHC 03/15/2018 --> 80% pLAD, 99% OM1, 100% pRCA --> failed PCI and  Tx'd to [facility] for CVTS consult; underwent 3v CABG (LIMA-LAD, SVG-OM, SVG-RI) on 03/25/2018.   Persistent atrial fibrillation (HCC)    a.) CHA2DS2-VASc = 4 (age x2, HTN, prior MI). b.) rate/rhythm maintained without pharmacological intervention; no daily anticoagulation.   Renal cell cancer, left (HCC) 05/27/2021   a.) CT urogram --> 4.1 x 3.6 x 4.7 cm mass at LEFT kidney pole. b.) CNB (+) for RCC, conventional clear cell type, nuclear grade II. 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 d.) PET CT 07/31/2021 revealed FDG avid LEFT renal mass (SUV 4.12); scheduled for nephrectomy.   Right lower lobe pulmonary nodule 05/27/2021   a.) CT chest --> measured 9 mm.   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    a.) 3v CABG: LIMA-LAD, SVG-OM, SVG-RI   SVT (supraventricular tachycardia) 2009   a.) s/p SVT ablation while living in [place]    Surgical History: Past Surgical History:  Procedure Laterality Date   COLONOSCOPY     CORONARY ARTERY BYPASS GRAFT N/A 03/22/2018   Procedure: CORONARY ARTERY BYPASS GRAFTING (CABG) x 3 WITH ENDOSCOPIC HARVESTING OF RIGHT GREATER SAPHENOUS VEIN;  Surgeon: Kerin Perna, MD;  Location: [facility] OR;  Service: Open Heart Surgery;  Laterality: N/A;   CORONARY STENT INTERVENTION N/A 03/16/2018   Procedure: CORONARY STENT INTERVENTION;  Surgeon: Alwyn Pea, MD;  Location: [facility] INVASIVE CV LAB;  Service: Cardiovascular;  Laterality: N/A;   LAPAROSCOPIC NEPHRECTOMY, HAND ASSISTED Left 09/08/2021   Procedure: HAND ASSISTED LAPAROSCOPIC NEPHRECTOMY;  Surgeon: [name], MD;  Location: [facility] ORS;  Service: Urology;  Laterality: Left;   LEFT ATRIAL APPENDAGE OCCLUSION N/A 03/22/2018   Procedure: LEFT ATRIAL APPENDAGE OCCLUSION USING 40 MM ATRICURE ATRICLIP;  Surgeon: Kerin Perna, MD;  Location[facility] OR;  Service: Open Heart Surgery;  Laterality: N/A;   LEFT HEART CATH AND CORONARY ANGIOGRAPHY N/A 03/15/2018   Procedure: LEFT HEART CATH AND CORONARY ANGIOGRAPHY and PCI stent;  Surgeon: Alwyn Pea, MD;  Location: [facility] INVASIVE CV LAB;  Service: Cardiovascular;  Laterality: N/A;   SVT ABLATION N/A 2009   Procedure: SVT ABLATION; Location: performed while living in [place].   TEE WITHOUT CARDIOVERSION N/A 03/22/2018   Procedure: TRANSESOPHAGEAL ECHOCARDIOGRAM (TEE);  Surgeon: Donata Clay, Theron Arista, MD;  Location: [facility] OR;  Service: Open Heart Surgery;  Laterality: N/A;   TRANSURETHRAL RESECTION OF BLADDER TUMOR N/A 07/28/2021   Procedure: TRANSURETHRAL RESECTION OF BLADDER TUMOR (TURBT)WITH  GEMCITABINE;  Surgeon: [name], MD;  Location: [facility] ORS;  Service: Urology;  Laterality: N/A;    Home Medications:  Allergies as of 12/09/2022   No Known Allergies       Medication List        Accurate as of December 09, 2022 10:43 AM. If you have any questions, ask your nurse or doctor.          STOP taking these medications    calcium carbonate 500 MG chewable tablet Commonly known as: TUMS - dosed in mg elemental calcium       TAKE these medications    apixaban 5 MG Tabs tablet Commonly known as: ELIQUIS Take by mouth.   atorvastatin 80 MG tablet Commonly known as: LIPITOR TAKE 1 TABLET(80 MG) BY MOUTH DAILY AT 6 PM   finasteride 5 MG tablet Commonly known as: PROSCAR Take 1 tablet (5 mg total) by mouth daily.   FISH OIL PO Take 1 tablet by mouth daily.  Gemtesa 75 MG Tabs Generic drug: Vibegron TAKE 1 TABLET BY MOUTH DAILY   multivitamin with minerals tablet Take 1 tablet by mouth daily.   tamsulosin 0.4 MG Caps capsule Commonly known as: FLOMAX TAKE 1 CAPSULE(0.4 MG) BY MOUTH DAILY   traZODone 50 MG tablet Commonly known as: DESYREL Take 1 tablet by mouth daily        Family History: Family History  Problem Relation Age of Onset   Heart disease Mother    Heart disease Father    Arrhythmia Sister    Heart disease Sister     Social History:  reports that he quit smoking about 35 years ago. His smoking use included cigarettes. He has a 25.00 pack-year smoking history. He has never been exposed to tobacco smoke. He has never used smokeless tobacco. He reports that he does not currently use alcohol. He reports that he does not currently use drugs.   Physical Exam: BP (!) 175/92   Pulse 81   Ht 6' (1.829 m)   Wt 213 lb 8 oz (96.8 kg)   BMI 28.96 kg/m   Constitutional:  Alert and oriented, No acute distress. HEENT: [place] AT, moist mucus membranes.  Trachea midline, no masses. Neurologic: Grossly intact, no focal deficits, moving all 4 extremities. Psychiatric: Normal mood and affect.   Assessment & Plan:    Urinary frequency  - We discussed alternatives including TURP vs. holmium laser enucleation of  the prostate vs. greenlight laser ablation. Differences between the surgical procedures were discussed as well as the risks and benefits of each.  He is most interested in HoLEP.  - We discussed the common postoperative course following holep including need for overnight Foley catheter, temporary worsening of irritative voiding symptoms, and occasional stress incontinence which typically lasts up to 6 months but can persist.  We discussed retrograde ejaculation and damage to surrounding structures including the urinary sphincter. Other uncommon complications including hematuria and urinary tract infection.  - He understands all of the above and is willing to proceed as planned. We reviewed the surgery in detail today including the preoperative, intraoperative, and postoperative course.  This will most likely be an outpatient procedure pending the degree of post op hematuria.  He will go home with catheter for a few days post op and will either be taught how to remove his own catheter or return to the office for catheter removal.  - Risk of bleeding, infection, damage surrounding structures, injury to the bladder/ urethral, bladder neck contracture, ureteral stricture, retrograde ejaculation, stress/ urge incontinence, exacerbation of irritative voiding symptoms were all discussed in detail.    Micah Flesher over doing Kegel exercises to start strengthening the pelvic floor prior to and after surgery. He will be able to start coming off his medications.  - He will discuss this with his cardiologist, Dr. [name], to get a medical clearance regarding his Afib and blood thinners.   - Did a pre-op urine today.   2. Prostate cancer  - Enlarged prostate. Plan for HoLep procedure. Details above.  No follow-ups on file.   [facility] [address], [address] [place], [place] [address] [phone]

## 2022-12-09 NOTE — Patient Instructions (Signed)

## 2022-12-09 NOTE — Progress Notes (Signed)
David Mccullough presents for an office visit. BP today is _175/92__________. He is not on BP medication. Greater than 140/90. Provider  notified. Pt advised to__f/u with PCP____________. Pt voiced understanding.

## 2022-12-10 ENCOUNTER — Other Ambulatory Visit: Payer: Self-pay | Admitting: Urology

## 2022-12-10 ENCOUNTER — Telehealth: Payer: Self-pay

## 2022-12-10 DIAGNOSIS — G4733 Obstructive sleep apnea (adult) (pediatric): Secondary | ICD-10-CM | POA: Diagnosis not present

## 2022-12-10 DIAGNOSIS — N401 Enlarged prostate with lower urinary tract symptoms: Secondary | ICD-10-CM

## 2022-12-10 NOTE — Progress Notes (Signed)
  Phone Number: [phone] for Surgical Coordinator Fax Number: [phone]  REQUEST FOR SURGICAL CLEARANCE       Date: Date: 12/10/22  Faxed to: Dr. [name], MD  Surgeon: Dr. [name], MD     Date of Surgery: 01/18/2023  Operation: Holmium Laser Enucleation of the Prostate  Anesthesia Type: General   Diagnosis: Benign Prostatic Hyperplasia with Urinary Obstruction  Patient Requires:   Cardiac / Vascular Clearance : Yes  Reason: Would like for patient to hold Eliquis prior to procedure.     Risk Assessment:    Low   []       Moderate   []     High   []           This patient is optimized for surgery  YES []       NO   []    I recommend further assessment/workup prior to surgery. YES []      NO  []   Appointment scheduled for: _______________________   Further recommendations: ____________________________________     Physician Signature:__________________________________   Printed Name: ________________________________________   Date: _________________

## 2022-12-10 NOTE — Addendum Note (Signed)
Addended by: Letta Kocher A on: 12/10/2022 02:01 PM   Modules accepted: Orders

## 2022-12-10 NOTE — Telephone Encounter (Signed)
I spoke with Mrs. David Mccullough, (Patient's wife). We have discussed possible surgery dates and Monday July 22nd, 2024 was agreed upon by all parties. Patient given information about surgery date, what to expect pre-operatively and post operatively.  We discussed that a Pre-Admission Testing office will be calling to set up the pre-op visit that will take place prior to surgery, and that these appointments are typically done over the phone with a Pre-Admissions RN. Informed patient that our office will communicate any additional care to be provided after surgery. Patients questions or concerns were discussed during our call. Advised to call our office should there be any additional information, questions or concerns that arise. Patient verbalized understanding.

## 2022-12-10 NOTE — Progress Notes (Signed)
   [facility] Surgical Posting Form  Surgery Date: Date: 01/18/2023  Surgeon: Dr. [name], MD  Inpt ( No  )   Outpt (Yes)   Obs ( No  )   Diagnosis: N40.1, N13.8 Benign Prostatic Hyperplasia with Urinary Obstruction  -CPT: 16109  Surgery: Holmium Laser Enucleation of the Prostate  Stop Anticoagulations: Yes and also hold ASA  Cardiac/Medical/Pulmonary Clearance needed: yes  Clearance needed from Dr: [name], MD  Clearance request sent on: Date: 12/10/22  *Orders entered into EPIC  Date: 12/10/22   *Case booked in EPIC  Date: 12/10/22  *Notified pt of Surgery: Date: 12/10/22  PRE-OP UA & CX: no  *Placed into Prior Authorization Work [place] Date: 12/10/22  Assistant/laser/rep:No

## 2022-12-10 NOTE — Progress Notes (Signed)
Surgical Physician Order Form [facility]  * Scheduling expectation : Next Available  *Length of Case:   *Clearance needed: yes- Dr. [name]  *Anticoagulation Instructions: Hold all anticoagulants  *Aspirin Instructions: Hold Aspirin  *Post-op visit Date/Instructions:   2 day for VT, 6 weeks IPSS/ PVR  *Diagnosis: BPH w/urinary obstruction  *Procedure:     HOLEP (72536)   Additional orders: N/A  -Admit type: OUTpatient  -Anesthesia: General  -VTE Prophylaxis Standing Order SCD's       Other:   -Standing Lab Orders Per Anesthesia    Lab other: None  -Standing Test orders EKG/Chest x-ray per Anesthesia       Test other:   - Medications:  Ancef 2gm IV  -Other orders:  N/A

## 2022-12-14 ENCOUNTER — Other Ambulatory Visit: Payer: Self-pay | Admitting: Urology

## 2022-12-14 DIAGNOSIS — R3915 Urgency of urination: Secondary | ICD-10-CM

## 2022-12-16 DIAGNOSIS — Z951 Presence of aortocoronary bypass graft: Secondary | ICD-10-CM | POA: Diagnosis not present

## 2022-12-16 DIAGNOSIS — Z87898 Personal history of other specified conditions: Secondary | ICD-10-CM | POA: Diagnosis not present

## 2022-12-16 DIAGNOSIS — Z79899 Other long term (current) drug therapy: Secondary | ICD-10-CM | POA: Diagnosis not present

## 2022-12-16 DIAGNOSIS — G4733 Obstructive sleep apnea (adult) (pediatric): Secondary | ICD-10-CM | POA: Diagnosis not present

## 2022-12-16 DIAGNOSIS — I10 Essential (primary) hypertension: Secondary | ICD-10-CM | POA: Diagnosis not present

## 2022-12-16 DIAGNOSIS — I214 Non-ST elevation (NSTEMI) myocardial infarction: Secondary | ICD-10-CM | POA: Diagnosis not present

## 2022-12-16 DIAGNOSIS — I4891 Unspecified atrial fibrillation: Secondary | ICD-10-CM | POA: Diagnosis not present

## 2022-12-16 DIAGNOSIS — Z01818 Encounter for other preprocedural examination: Secondary | ICD-10-CM | POA: Diagnosis not present

## 2022-12-16 DIAGNOSIS — I2510 Atherosclerotic heart disease of native coronary artery without angina pectoris: Secondary | ICD-10-CM | POA: Diagnosis not present

## 2022-12-16 DIAGNOSIS — I429 Cardiomyopathy, unspecified: Secondary | ICD-10-CM | POA: Diagnosis not present

## 2022-12-16 DIAGNOSIS — N183 Chronic kidney disease, stage 3 unspecified: Secondary | ICD-10-CM | POA: Diagnosis not present

## 2022-12-16 DIAGNOSIS — E782 Mixed hyperlipidemia: Secondary | ICD-10-CM | POA: Diagnosis not present

## 2022-12-17 DIAGNOSIS — Z8679 Personal history of other diseases of the circulatory system: Secondary | ICD-10-CM | POA: Diagnosis not present

## 2022-12-17 DIAGNOSIS — R55 Syncope and collapse: Secondary | ICD-10-CM | POA: Diagnosis not present

## 2022-12-17 DIAGNOSIS — R42 Dizziness and giddiness: Secondary | ICD-10-CM | POA: Diagnosis not present

## 2022-12-28 DIAGNOSIS — Z79899 Other long term (current) drug therapy: Secondary | ICD-10-CM | POA: Diagnosis not present

## 2022-12-28 DIAGNOSIS — I2510 Atherosclerotic heart disease of native coronary artery without angina pectoris: Secondary | ICD-10-CM | POA: Diagnosis not present

## 2022-12-28 DIAGNOSIS — E782 Mixed hyperlipidemia: Secondary | ICD-10-CM | POA: Diagnosis not present

## 2023-01-08 ENCOUNTER — Encounter
Admission: RE | Admit: 2023-01-08 | Discharge: 2023-01-08 | Disposition: A | Discharge status: Home / Self Care | Payer: Medicare Other | Source: Ambulatory Visit | Attending: Urology | Admitting: Urology

## 2023-01-08 ENCOUNTER — Other Ambulatory Visit: Payer: Self-pay

## 2023-01-08 HISTORY — DX: Sleep apnea, unspecified: G47.30

## 2023-01-08 NOTE — Patient Instructions (Addendum)
Your procedure is scheduled on: 01/18/23 - Monday Report to the Registration Desk on the 1st floor of the [facility]. To find out your arrival time, please call [phone] between 1PM - 3PM on: 01/15/23 Friday If your arrival time is 6:00 am, do not arrive before that time as the [facility] entrance doors do not open until 6:00 am.  REMEMBER: Instructions that are not followed completely may result in serious medical risk, up to and including death; or upon the discretion of your surgeon and anesthesiologist your surgery may need to be rescheduled.  Do not eat food or drink any liquids after midnight the night before surgery.  No gum chewing or hard candies.  One week prior to surgery: Stop 0n 01/11/23, Anti-inflammatories (NSAIDS) such as Advil, Aleve, Ibuprofen, Motrin, Naproxen, Naprosyn and Aspirin based products such as Excedrin, Goody's Powder, BC Powder. You may take Tylenol if needed for pain up until the day of surgery.  Stop on 01/11/23, ANY OVER THE COUNTER supplements until after surgery : Omega-3 Fatty Acids ,MULTIVITAMIN .    apixaban (ELIQUIS ) Hold 2 days 01/16/23.   TAKE ONLY THESE MEDICATIONS THE MORNING OF SURGERY WITH A SIP OF WATER:  amiodarone (PACERONE)  finasteride (PROSCAR)  GEMTESA     No Alcohol for 24 hours before or after surgery.  No Smoking including e-cigarettes for 24 hours before surgery.  No chewable tobacco products for at least 6 hours before surgery.  No nicotine patches on the day of surgery.  Do not use any "recreational" drugs for at least a week (preferably 2 weeks) before your surgery.  Please be advised that the combination of cocaine and anesthesia may have negative outcomes, up to and including death. If you test positive for cocaine, your surgery will be cancelled.  On the morning of surgery brush your teeth with toothpaste and water, you may rinse your mouth with mouthwash if you wish. Do not swallow any toothpaste or  mouthwash.  Do not wear jewelry, make-up, hairpins, clips or nail polish.  Do not wear lotions, powders, or perfumes.   Do not shave body hair from the neck down 48 hours before surgery.  Contact lenses, hearing aids and dentures may not be worn into surgery.  Do not bring valuables to the hospital. [facility] is not responsible for any missing/lost belongings or valuables.   Notify your doctor if there is any change in your medical condition (cold, fever, infection).  Wear comfortable clothing (specific to your surgery type) to the hospital.  After surgery, you can help prevent lung complications by doing breathing exercises.  Take deep breaths and cough every 1-2 hours. Your doctor may order a device called an Incentive Spirometer to help you take deep breaths. When coughing or sneezing, hold a pillow firmly against your incision with both hands. This is called "splinting." Doing this helps protect your incision. It also decreases belly discomfort.  If you are being admitted to the hospital overnight, leave your suitcase in the car. After surgery it may be brought to your room.  In case of increased patient census, it may be necessary for you, the patient, to continue your postoperative care in the Same Day Surgery department.  If you are being discharged the day of surgery, you will not be allowed to drive home. You will need a responsible individual to drive you home and stay with you for 24 hours after surgery.   If you are taking public transportation, you will need  to have a responsible individual with you.  Please call the Pre-admissions Testing Dept. at [phone] if you have any questions about these instructions.  Surgery Visitation Policy:  Patients having surgery or a procedure may have two visitors.  Children under the age of 76 must have an adult with them who is not the patient.  Inpatient Visitation:    Visiting hours are 7 a.m. to 8 p.m. Up to four  visitors are allowed at one time in a patient room. The visitors may rotate out with other people during the day.  One visitor age 68 or older may stay with the patient overnight and must be in the room by 8 p.m.

## 2023-01-11 ENCOUNTER — Telehealth: Payer: Self-pay

## 2023-01-11 NOTE — Telephone Encounter (Signed)
Call left on triage line 7/12 349p  pts wife???  Pt is having a surg on 7/22 by [facility]. States anesthesia was suppose to call by 3pm and no one has called. She wants to know time of surgery and where the surgery will be preformed.   Pt states anesthesia did call him on Friday. He is still unsure of the time of surg.   Informed pt that he will need to call pre admit on 7/19 between 1-3pm to get time. He asked me to send him a message with the number.  My chart message sent.

## 2023-01-14 DIAGNOSIS — I10 Essential (primary) hypertension: Secondary | ICD-10-CM | POA: Diagnosis not present

## 2023-01-14 DIAGNOSIS — N1832 Chronic kidney disease, stage 3b: Secondary | ICD-10-CM | POA: Diagnosis not present

## 2023-01-14 DIAGNOSIS — N2581 Secondary hyperparathyroidism of renal origin: Secondary | ICD-10-CM | POA: Diagnosis not present

## 2023-01-15 ENCOUNTER — Encounter: Payer: Self-pay | Admitting: Urology

## 2023-01-15 NOTE — Progress Notes (Signed)
Perioperative / Anesthesia Services  Pre-Admission Testing Clinical Review / Preoperative Anesthesia Consult  Date: 01/15/23  Patient Demographics:  Name: David Mccullough DOB:   June 26, 1946 MRN:   [id number]  Planned Surgical Procedure(s):    Case: [id number] Date/Time: 01/18/23 1006   Procedure: HOLEP-LASER ENUCLEATION OF THE PROSTATE WITH MORCELLATION   Anesthesia type: General   Pre-op diagnosis: Benign Prostatic Hyperplasia with Urinary Obstruction   Location: [facility] OR ROOM 10 / [facility]S FOR AN[facility]   Surgeons: [name], MD     NOTE: Available PAT nursing documentation and vital signs have been reviewed. Clinical nursing staff has updated patient's PMH/PSHx, current medication list, and drug allergies/intolerances to ensure comprehensive history available to assist in medical decision making as it pertains to the aforementioned surgical procedure and anticipated anesthetic course. Extensive review of available clinical information personally performed. [facility] [facility] and PSHx updated with any diagnoses/procedures that  may have been inadvertently omitted during his intake with the pre-admission testing department's nursing staff.  Clinical Discussion:  David Mccullough is a 77 y.o. male who is submitted for pre-surgical anesthesia review and clearance prior to him undergoing the above procedure. Patient is a Former Smoker (25 pack years; quit 06/1987). Pertinent PMH includes: CAD (s/p CABG), NSTEMI, atrial fibrillation/flutter (s/p ablation), IRBBB, cardiomyopathy, mild cardiomegaly, SVT, aortic atherosclerosis, HTN, HLD, CKD-III, RIGHT solitary pulmonary nodule, GERD (uses CaCO3 tablets PRN), hiatal hernia, papillary bladder tumor, LEFT renal mass (Bx proven RCC; s/p LEFT nephrectomy), BPH, nephrolithiasis, cervical spinal stenosis (C6-C7), lumbar DDD, depression, insomnia.   Patient is followed by cardiology David Pares, MD). He was last seen in the cardiology clinic on  12/16/2022; notes reviewed. At the time of his clinic visit, patient doing well overall from a cardiovascular perspective. Patient complained of being "weak and tired". He was experiencing episodes of mild exertional dyspnea. Patient denied any chest pain, PND, orthopnea, palpitations, significant peripheral edema, vertiginous symptoms, or presyncope/syncope. Patient with a past medical history significant for cardiovascular diagnoses. Documented physical exam was grossly benign, providing no evidence of acute exacerbation and/or decompensation of the patient's known cardiovascular conditions.  PMH significant for atrial flutter. He underwent an ablation procedure while living in [place] in approximately 2009. Since ablation, patient denies recurrence.    Patient suffered an NSTEMI on 03/14/2018. He underwent a diagnostic left heart catheterization on 03/15/2018 that revealed an ef of 50% with inferior hypokinesis. There was multivessel CAD noted; 80% pLAD, 99% OM1, and 100% pRCA. PCI was attempted, however failed. Initially there was TIMI-1 flow, however during interventional attempt, flow degraded to TIMI-0. Patient was transferred to [facility] for consultation with CVTS regarding high risk PCI versus surgical revascularization.     Patient ultimately underwent a 3 vessel CABG on 03/22/2018. LIMA-LAD, SVG-OM, and SVG-RI bypass grafts were placed.    Last TTE was performed on 11/12/2022 revealing a mildly left ventricular systolic function with an EF of 45%. There was global hypokinesis. Diastolic function parameters normal. There was moderate biatrial and mild right ventricular enlargement. Moderate mitral and tricuspid, in addition to trivial aortic and pulmonic, valve regurgitation observed. All transvalvular gradients were noted to be normal providing no evidence suggestive of valvular stenosis. RVSP 33.4 mmHg. Aorta normal in size with no evidence of aneurysmal dilatation.   Myocardial perfusion  imaging study performed on 11/12/2022 demonstrated an mildly left ventricular systolic function with an LVEF of 44%. Inferolateral basal walls. Stress imaging revealed moderate inferolateral basal wall defect with no significant areas of redistribution or reversible ischemia.  Baseline ECG revealed atrial fibrillation. Study determined to be intermediate risk.   Cardiac MRI performed on 11/18/2022 revealing reduced left ventricular systolic function with an LVEF of 41%. There was global hypokinesis, in addition to akinesis of the inferolateral wall. Near transmural LGE in the basal inferior and inferolateral left ventricular myocardium comprising about 10 grams (`8%) of the total myocardial mass. Finding consistent with prior myocardial infarction in the basal inferior and inferolateral left ventricular myocardium.   Patient with an atrial fibrillation diagnosis; CHA2DS2-VASc Score = 5 (age x 2, HTN, prior MI, vascular disease history). Cardiac rate and rhythm had been being maintained intrinsically without pharmacological intervention. Following recent event monitor study and cardiac MRI, the decision was made to start patient on antiarrythmic therapy using amiodarone. He was also started on oral DOAC using apixaban. Blood pressure well controlled at 122/76 mmHg on currently prescribed ARB (losartan) monotherapy. He is on atorvastatin + omega-3 fatty acid capsule daily for his HLD diagnosis and further ASCVD prevention. Patient is not diabetic. Patient does not have an OSAH diagnosis. Patient is able to complete all of his  ADL/IADLs without cardiovascular limitation.  Per the [facility], patient is able to achieve at least 4 METS of physical activity without experiencing any significant degree of angina/anginal equivalent symptoms. No other changes were made to his medication regimen during his visit with cardiology.  Patient scheduled to follow-up with outpatient cardiology in 2 months or sooner if  needed.  David Mccullough is scheduled for an HOLEP-LASER ENUCLEATION OF THE PROSTATE WITH MORCELLATION on 01/18/2023 with Dr. [name], MD.  Given patient's past medical history significant for cardiovascular diagnoses, presurgical cardiac clearance was sought by the PAT team. Per cardiology, "this patient is optimized for surgery and may proceed with the planned procedural course with a ACCEPTABLE risk of significant perioperative cardiovascular complications".    Again, this patient is on daily DOAC therapy. He has been instructed on recommendations for holding his apixaban for 2 days prior to his procedure with plans to restart as soon as postoperative bleeding risk felt to be minimized by his attending surgeon. The patient has been instructed that his last dose of his apixaban should be on 01/15/2023.  Patient denies previous perioperative complications with anesthesia in the past. In review of the available records, it is noted that patient underwent a general anesthetic course here at [facility] (ASA III) in 08/2021 without documented complications.      12/09/2022   10:23 AM 12/09/2022    9:20 AM 11/06/2022    2:01 PM  Vitals with BMI  Height  6\' 0"    Weight  213 lbs 8 oz   BMI  28.95   Systolic 175 160 782  Diastolic 92 79 87  Pulse 81 69 84    Providers/Specialists:   NOTE: Primary physician provider listed below. Patient may have been seen by APP or partner within same practice.   PROVIDER ROLE / SPECIALTY LAST David Bitter, MD Urology (Surgeon) 12/09/2022  David Limerick, MD Primary Care Provider 10/19/2022  Rudean Hitt, MD Cardiology 12/16/2022  Mady Haagensen, MD Nephrology 01/14/2023  Ned Clines, MD Pulmonary Medicine  12/07/2022  Gerarda Fraction, MD Medical Oncology 09/01/2022   Allergies:  Patient has no known allergies.  Current Home Medications:   No current facility-administered medications for this  encounter.    amiodarone (PACERONE) 200 MG tablet   apixaban (ELIQUIS) 5 MG TABS tablet   atorvastatin (LIPITOR) 80 MG tablet  finasteride (PROSCAR) 5 MG tablet   GEMTESA 75 MG TABS   losartan (COZAAR) 25 MG tablet   Multiple Vitamins-Minerals (MULTIVITAMIN WITH MINERALS) tablet   Omega-3 Fatty Acids (FISH OIL PO)   tamsulosin (FLOMAX) 0.4 MG CAPS capsule   traZODone (DESYREL) 50 MG tablet   History:   Past Medical History:  Diagnosis Date   Aortic atherosclerosis (HCC)    Bladder tumor 07/03/2021   a.) cystoscopy revealed 1.5 cm relatively flat papillary tumor lateral to UO (LEFT); felt to be consistent with RCC   BPH (benign prostatic hyperplasia)    CAD (coronary artery disease) 03/15/2018   a.) NSTEMI 03/14/2018 --> LHC --> EF 50% with inf HK; 80% pLAD, 99% OM1, 100% pRCA --> failed PCI (TIMI-1 reduced to TIMI-0); transferred to [facility] and underwent 3v CABG (LIMA-LAD, SVG-OM, SVG-RI) on 03/22/2018; b.) cMRI 11/18/2022: EF 41%, glob HK, basal and inferolateral wall AK. Near transmural LGE in basal inferior and inferolat LV myocardium (10 g; ~8% myocardial mass) c/w prior infarct   Cardiomyopathy (HCC) 03/15/2018   a.) LHC 03/15/2018: EF 50%; b.) TTE 03/17/2018: EF 55-60%; c.) TEE 03/22/2018: 50-55%; d.) TTE 11/12/2022: EF 45%; e.) cMRI 11/18/2022: EF 41%   Cervical spinal stenosis    a.) C6-C7   CKD (chronic kidney disease), stage III (HCC)    Colon polyps    DDD (degenerative disc disease), lumbar    Depression    Elevated PSA    Enlarged prostate    GERD (gastroesophageal reflux disease)    Hiatal hernia    Hyperlipidemia    Hypertension    Incomplete right bundle branch block (RBBB)    Insomnia    a.) takes trazodone PRN   Long term current use of amiodarone    Long term current use of anticoagulant    a.) apixaban   Mild cardiomegaly    Nephrolithiasis    NSTEMI (non-ST elevated myocardial infarction) (HCC) 03/14/2018   a.) LHC 03/15/2018 --> 80% pLAD, 99%  OM1, 100% pRCA --> failed PCI and Tx'd to [facility] for CVTS consult; underwent 3v CABG (LIMA-LAD, SVG-OM, SVG-RI) on 03/25/2018.   Persistent atrial fibrillation (HCC)    a.) CHA2DS2-VASc = 5 (age x2, HTN, prior MI, vascular disease history). 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 b.) CNB (+) for RCC, conventional clear cell type, nuclear grade II. 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 d.) PET CT 07/31/2021 revealed FDG avid LEFT renal mass (SUV 4.12); e.) s/p LEFT nephrectomy 09/08/2021 -> pathology (+) for stage 1 RCC   Right lower lobe pulmonary nodule 05/27/2021   a.) CT chest --> measured 9 mm.   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   a.) 3v CABG: LIMA-LAD, SVG-OM, SVG-RI   Sleep apnea    SVT (supraventricular tachycardia) 2009   a.) s/p SVT ablation while living in [place]   Umbilical hernia    Past Surgical History:  Procedure Laterality Date   COLONOSCOPY     CORONARY ARTERY BYPASS GRAFT N/A 03/22/2018   Procedure: CORONARY ARTERY BYPASS GRAFTING (CABG) x 3 WITH ENDOSCOPIC HARVESTING OF RIGHT GREATER SAPHENOUS VEIN;  Surgeon: Kerin Perna, MD;  Location: [facility] OR;  Service: Open Heart Surgery;  Laterality: N/A;   CORONARY STENT INTERVENTION N/A 03/16/2018   Procedure:  CORONARY STENT INTERVENTION;  Surgeon: Alwyn Pea, MD;  Location: [facility] INVASIVE CV LAB;  Service: Cardiovascular;  Laterality: N/A;   LAPAROSCOPIC NEPHRECTOMY, HAND ASSISTED Left 09/08/2021   Procedure: HAND ASSISTED LAPAROSCOPIC NEPHRECTOMY;  Surgeon: [name], MD;  Location: [facility] ORS;  Service: Urology;  Laterality: Left;   LEFT ATRIAL APPENDAGE OCCLUSION N/A 03/22/2018   Procedure: LEFT ATRIAL APPENDAGE OCCLUSION USING 40 MM ATRICURE ATRICLIP;  Surgeon: Kerin Perna, MD;  Location: [facility] OR;  Service: Open Heart Surgery;  Laterality: N/A;   LEFT HEART CATH AND CORONARY ANGIOGRAPHY N/A 03/15/2018   Procedure: LEFT HEART CATH AND CORONARY ANGIOGRAPHY and PCI stent;  Surgeon: Alwyn Pea, MD;  Location: [facility] INVASIVE CV LAB;  Service: Cardiovascular;  Laterality: N/A;   SVT ABLATION N/A 2009   Procedure: SVT ABLATION; Location: performed while living in [place].   TEE WITHOUT CARDIOVERSION N/A 03/22/2018   Procedure: TRANSESOPHAGEAL ECHOCARDIOGRAM (TEE);  Surgeon: Donata Clay, Theron Arista, MD;  Location: [facility] OR;  Service: Open Heart Surgery;  Laterality: N/A;   TRANSURETHRAL RESECTION OF BLADDER TUMOR N/A 07/28/2021   Procedure: TRANSURETHRAL RESECTION OF BLADDER TUMOR (TURBT)WITH  GEMCITABINE;  Surgeon: [name], MD;  Location: [facility] ORS;  Service: Urology;  Laterality: N/A;   Family History  Problem Relation Age of Onset   Heart disease Mother    Heart disease Father    Arrhythmia Sister    Heart disease Sister    Social History   Tobacco Use   Smoking status: Former    Current packs/day: 0.00    Average packs/day: 1 pack/day for 25.0 years (25.0 ttl pk-yrs)    Types: Cigarettes    Start date: 06/29/1962    Quit date: 06/30/1987    Years since quitting: 35.5    Passive exposure: Never   Smokeless tobacco: Never  Vaping Use   Vaping status: Never Used  Substance Use Topics   Alcohol use: Not Currently   Drug use: Not Currently    Pertinent Clinical Results:  LABS:  Lab Results  Component Value Date   WBC 10.1 10/17/2022   HGB 13.1 10/17/2022   HCT 38.5 (L) 10/17/2022   MCV 83.3 10/17/2022   PLT 185 10/17/2022   Lab Results  Component Value Date   NA 135 10/17/2022   K 4.2 10/17/2022   CO2 24 10/17/2022   GLUCOSE 108 (H) 10/17/2022   BUN 30 (H) 10/17/2022   CREATININE 1.77 (H) 10/17/2022   CALCIUM 9.1 10/17/2022   EGFR 40 (L) 10/14/2021   GFRNONAA 39 (L) 10/17/2022   Component Date Value Ref Range Status    Specific Gravity, UA 12/09/2022 1.010  1.005 - 1.030 Final   pH, UA 12/09/2022 6.5  5.0 - 7.5 Final   Color, UA 12/09/2022 Yellow  Yellow Final   Appearance Ur 12/09/2022 Clear  Clear Final   Leukocytes,UA 12/09/2022 Negative  Negative Final   Protein,UA 12/09/2022 Negative  Negative/Trace Final   Glucose, UA 12/09/2022 Negative  Negative Final   Ketones, UA 12/09/2022 Negative  Negative Final   RBC, UA 12/09/2022 Trace (A)  Negative Final   Bilirubin, UA 12/09/2022 Negative  Negative Final   Urobilinogen, Ur 12/09/2022 0.2  0.2 - 1.0 mg/dL Final   Nitrite, UA 40/98/1191 Negative  Negative Final   Microscopic Examination 12/09/2022 See below:   Final   Scan Result 12/09/2022 84 ml   Final   WBC, UA 12/09/2022 0-5  0 - 5 /hpf Final   RBC, Urine 12/09/2022 0-2  0 - 2 /hpf Final   Epithelial Cells (non renal) 12/09/2022 0-10  0 - 10 /hpf Final   Bacteria, UA 12/09/2022 None seen  None seen/Few Final    ECG: Date: 10/20/2022 Time ECG obtained: 0935 AM Rate: 69 bpm Rhythm: atrial fibrillation; IRBBB Axis (leads I and aVF): Normal Intervals: QRS 106 ms. QTc 462 ms. ST segment and T wave changes: Inferior ST depression. Evidence of age undetermined inferior and anterior infarcts present.  Comparison: Similar to previous tracing obtained on 10/17/2022 NOTE: Tracing obtained at [facility]; unable for review. Above based on cardiologist's interpretation.    IMAGING / PROCEDURES: MR CARDIAC MORPHOLOGY W WO CONTRAST performed on 11/18/2022 Normal LV size, mild to moderately reduced systolic function. LVEF 41%. Subendocardial, near transmural LGE/scar in the basal inferior and inferolateral left ventricular myocardium. LGE/Scar comprises 10g (about 8%) of total myocardial mass. Moderately reduced RV function. No significant valvular abnormalities. 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  CT CERVICAL SPINE WO CONTRAST  performed on 10/17/2022 No acute intracranial abnormality. Left frontal scalp hematoma without evidence of underlying calvarial fracture. No acute fracture or traumatic malalignment in the cervical spine. Likely moderate spinal canal stenosis at C6-C7.  NM PET IMAGE RESTAGE (PS) SKULL BASE TO THIGH performed on 07/31/2021 There is no significant FDG uptake associated with the right lower lobe lung nodule. Given the small size of this nodule (8 mm) continued surveillance is recommended to confirm stability of this nodule as certain low-grade pulmonary neoplasms as well as renal cell carcinomas may exhibit low level FDG uptake on PET-CT. Left kidney mass exhibits mild to moderate increased uptake within SUV max of 4.12. No signs of tracer avid nodal metastasis or today we will count 20 scheduled solid organ metastasis within the abdomen or pelvis   CT CHEST WITH CONTRAST performed on 06/13/2021 Solitary RIGHT lower lobe pulmonary nodule. Indeterminate finding in patient with LEFT renal mass. LEFT renal mass not imaged. Post CABG anatomy.   CT HEMATURIA WORKUP performed on 05/27/2021 4.7 cm enhancing mass of the left kidney lower pole with morphologic characteristics most compatible with renal cell carcinoma. No adenopathy or tumor thrombus in the left renal vein. 9 x 6 x 8 mm solid right lower lobe nodule. Fleischner criteria do not apply given the presence of the left renal mass. Completion imaging of the chest may be warranted in the context of staging workup. Surveillance of the dominant right lower lobe nodule is suggested. Bilateral single nonobstructive renal calculi. Tiny bladder calculi are noted. Prostatomegaly. Small type 1 hiatal hernia.  Mild cardiomegaly.  Cholelithiasis.  Prominent stool throughout the colon favors constipation.  Aortic atherosclerosis Lower lumbar spondylosis and degenerative disc disease.  Small umbilical hernia contains adipose tissue.   TRANSTHORACIC  ECHOCARDIOGRAM performed on 03/24/2018 Left ventricle: The cavity size was normal. There was mild concentric hypertrophy. Systolic function was normal. The estimated ejection fraction was in the range of 55% to 60%. Wall motion was normal; there were no regional wall motion  abnormalities. Left ventricular diastolic function parameters were normal.  Mitral valve: There was mild regurgitation. Valve area by pressure half-time: 1.98 cm^2.  Left atrium: The atrium was moderately dilated.  Right ventricle: The cavity size was normal. Wall thickness was normal. Systolic function was normal.  Pulmonary arteries: Systolic pressure was mildly increased. PA peak pressure: 37 mm Hg (S).  Pericardium, extracardiac: A mild pericardial effusion was identified lateral to the left ventricle. Features were  not consistent with tamponade physiology.   CORONARY ARTERY BYPASS GRAFTING PROCEDURE performed on 03/22/2018 3 vessel CABG procedure LIMA-LAD SVG-OM SVG-RI   LEFT HEART CATHETERIZATION AND CORONARY ANGIOGRAPHY performed on 03/15/2018 EF 50% Inferior hypokinesis Multi-vessel CAD 80% proximal LAD 99% OM1 100% proximal RCA Attempted PCI and recommendations Unsuccessful PCI and stent of OM1 failure across the lesion with wire Consider transfer the patient to tertiary care center for possible attempted intervention Patient vessel went from TIMI I to TIMI 0 flow Patient remained hemodynamically stable Anticoagulation with Angiomax was discontinued he has been loaded with aspirin and Plavix       Impression and Plan:  Prathik Aman has been referred for pre-anesthesia review and clearance prior to him undergoing the planned anesthetic and procedural courses. Available labs, pertinent testing, and imaging results were personally reviewed by me in preparation for upcoming operative/procedural course. [facility] medical record has been updated following extensive record review and patient interview with  PAT staff.   This patient has been appropriately cleared by cardiology with an overall ACCEPTABLE risk of experiencing significant perioperative cardiovascular complications. Based on clinical review performed today (01/15/23), barring any significant acute changes in the patient's overall condition, it is anticipated that he will be able to proceed with the planned surgical intervention. Any acute changes in clinical condition may necessitate his procedure being postponed and/or cancelled. Patient will meet with anesthesia team (MD and/or CRNA) on the day of his procedure for preoperative evaluation/assessment. Questions regarding anesthetic course will be fielded at that time.   Pre-surgical instructions were reviewed with the patient during his PAT appointment, and questions were fielded to satisfaction by PAT clinical staff. He has been instructed on which medications that he will need to hold prior to surgery, as well as the ones that have been deemed safe/appropriate to take on the day of his procedure. As part of the general education provided by PAT, patient made aware both verbally and in writing, that he would need to abstain from the use of any illegal substances during his perioperative course.  He was advised that failure to follow the provided instructions could necessitate case cancellation or result in serious perioperative complications up to and including death. Patient encouraged to contact PAT and/or his surgeon's office to discuss any questions or concerns that may arise prior to surgery; verbalized understanding.   Quentin Mulling, MSN, APRN, FNP-C, CEN [facility]  Peri-operative Services Nurse Practitioner Phone: [phone] Fax: [phone] 01/15/23 11:08 AM  NOTE: This note has been prepared using Dragon dictation software. Despite my best ability to proofread, there is always the potential that unintentional transcriptional errors may still occur from this  process.

## 2023-01-18 ENCOUNTER — Ambulatory Visit: Payer: Medicare Other | Admitting: Urgent Care

## 2023-01-18 ENCOUNTER — Other Ambulatory Visit: Payer: Self-pay

## 2023-01-18 ENCOUNTER — Encounter: Payer: Self-pay | Admitting: Urology

## 2023-01-18 ENCOUNTER — Encounter
Admission: RE | Disposition: A | Discharge status: Home / Self Care | Payer: Self-pay | Source: Home / Self Care | Attending: Urology

## 2023-01-18 ENCOUNTER — Ambulatory Visit
Admission: RE | Admit: 2023-01-18 | Discharge: 2023-01-18 | Disposition: A | Discharge status: Home / Self Care | Payer: Medicare Other | Attending: Urology | Admitting: Urology

## 2023-01-18 DIAGNOSIS — N401 Enlarged prostate with lower urinary tract symptoms: Secondary | ICD-10-CM | POA: Diagnosis not present

## 2023-01-18 DIAGNOSIS — I252 Old myocardial infarction: Secondary | ICD-10-CM | POA: Diagnosis not present

## 2023-01-18 DIAGNOSIS — E785 Hyperlipidemia, unspecified: Secondary | ICD-10-CM | POA: Insufficient documentation

## 2023-01-18 DIAGNOSIS — Z7901 Long term (current) use of anticoagulants: Secondary | ICD-10-CM | POA: Insufficient documentation

## 2023-01-18 DIAGNOSIS — I2510 Atherosclerotic heart disease of native coronary artery without angina pectoris: Secondary | ICD-10-CM | POA: Insufficient documentation

## 2023-01-18 DIAGNOSIS — N3289 Other specified disorders of bladder: Secondary | ICD-10-CM | POA: Diagnosis not present

## 2023-01-18 DIAGNOSIS — I130 Hypertensive heart and chronic kidney disease with heart failure and stage 1 through stage 4 chronic kidney disease, or unspecified chronic kidney disease: Secondary | ICD-10-CM | POA: Insufficient documentation

## 2023-01-18 DIAGNOSIS — N138 Other obstructive and reflux uropathy: Secondary | ICD-10-CM | POA: Diagnosis not present

## 2023-01-18 DIAGNOSIS — Z8546 Personal history of malignant neoplasm of prostate: Secondary | ICD-10-CM | POA: Insufficient documentation

## 2023-01-18 DIAGNOSIS — Z951 Presence of aortocoronary bypass graft: Secondary | ICD-10-CM | POA: Diagnosis not present

## 2023-01-18 DIAGNOSIS — I509 Heart failure, unspecified: Secondary | ICD-10-CM | POA: Insufficient documentation

## 2023-01-18 DIAGNOSIS — Z85528 Personal history of other malignant neoplasm of kidney: Secondary | ICD-10-CM | POA: Insufficient documentation

## 2023-01-18 DIAGNOSIS — N183 Chronic kidney disease, stage 3 unspecified: Secondary | ICD-10-CM | POA: Diagnosis not present

## 2023-01-18 DIAGNOSIS — Z87891 Personal history of nicotine dependence: Secondary | ICD-10-CM | POA: Diagnosis not present

## 2023-01-18 DIAGNOSIS — I4819 Other persistent atrial fibrillation: Secondary | ICD-10-CM | POA: Insufficient documentation

## 2023-01-18 DIAGNOSIS — Z79899 Other long term (current) drug therapy: Secondary | ICD-10-CM | POA: Diagnosis not present

## 2023-01-18 HISTORY — DX: Benign prostatic hyperplasia without lower urinary tract symptoms: N40.0

## 2023-01-18 HISTORY — PX: HOLEP-LASER ENUCLEATION OF THE PROSTATE WITH MORCELLATION: SHX6641

## 2023-01-18 HISTORY — DX: Umbilical hernia without obstruction or gangrene: K42.9

## 2023-01-18 HISTORY — DX: Unspecified right bundle-branch block: I45.10

## 2023-01-18 HISTORY — DX: Other long term (current) drug therapy: Z79.899

## 2023-01-18 HISTORY — DX: Cardiomegaly: I51.7

## 2023-01-18 HISTORY — DX: Chronic kidney disease, stage 3 unspecified: N18.30

## 2023-01-18 HISTORY — DX: Other intervertebral disc degeneration, lumbar region without mention of lumbar back pain or lower extremity pain: M51.369

## 2023-01-18 HISTORY — DX: Insomnia, unspecified: G47.00

## 2023-01-18 HISTORY — DX: Long term (current) use of anticoagulants: Z79.01

## 2023-01-18 HISTORY — DX: Other intervertebral disc degeneration, lumbar region: M51.36

## 2023-01-18 HISTORY — DX: Spinal stenosis, cervical region: M48.02

## 2023-01-18 HISTORY — DX: Diaphragmatic hernia without obstruction or gangrene: K44.9

## 2023-01-18 SURGERY — ENUCLEATION, PROSTATE, USING LASER, WITH MORCELLATION
Anesthesia: General | Site: Prostate

## 2023-01-18 MED ORDER — ONDANSETRON HCL 4 MG/2ML IJ SOLN
INTRAMUSCULAR | Status: AC
  Filled 2023-01-18: qty 2

## 2023-01-18 MED ORDER — DEXAMETHASONE SODIUM PHOSPHATE 10 MG/ML IJ SOLN
INTRAMUSCULAR | Status: DC | PRN
  Administered 2023-01-18: 10 mg via INTRAVENOUS

## 2023-01-18 MED ORDER — OXYBUTYNIN CHLORIDE 5 MG PO TABS: 5.0000 mg | ORAL_TABLET | Freq: Three times a day (TID) | ORAL | 0 refills | Status: DC | PRN

## 2023-01-18 MED ORDER — ORAL CARE MOUTH RINSE: 15.0000 mL | Freq: Once | OROMUCOSAL | Status: AC

## 2023-01-18 MED ORDER — PROPOFOL 10 MG/ML IV BOLUS
INTRAVENOUS | Status: DC | PRN
  Administered 2023-01-18: 160 mg via INTRAVENOUS

## 2023-01-18 MED ORDER — OXYCODONE HCL 5 MG/5ML PO SOLN: 5.0000 mg | Freq: Once | ORAL | Status: DC | PRN

## 2023-01-18 MED ORDER — CHLORHEXIDINE GLUCONATE 0.12 % MT SOLN
OROMUCOSAL | Status: AC
  Filled 2023-01-18: qty 15

## 2023-01-18 MED ORDER — EPHEDRINE 5 MG/ML INJ
INTRAVENOUS | Status: AC
  Filled 2023-01-18: qty 5

## 2023-01-18 MED ORDER — CEFAZOLIN SODIUM-DEXTROSE 2-4 GM/100ML-% IV SOLN
2.0000 g | INTRAVENOUS | Status: AC
  Administered 2023-01-18: 2 g via INTRAVENOUS

## 2023-01-18 MED ORDER — FENTANYL CITRATE (PF) 100 MCG/2ML IJ SOLN
INTRAMUSCULAR | Status: AC
  Filled 2023-01-18: qty 2

## 2023-01-18 MED ORDER — GLYCOPYRROLATE 0.2 MG/ML IJ SOLN
INTRAMUSCULAR | Status: DC | PRN
  Administered 2023-01-18 (×2): .1 mg via INTRAVENOUS

## 2023-01-18 MED ORDER — PHENYLEPHRINE 80 MCG/ML (10ML) SYRINGE FOR IV PUSH (FOR BLOOD PRESSURE SUPPORT)
PREFILLED_SYRINGE | INTRAVENOUS | Status: AC
  Filled 2023-01-18: qty 10

## 2023-01-18 MED ORDER — EPHEDRINE SULFATE (PRESSORS) 50 MG/ML IJ SOLN
INTRAMUSCULAR | Status: DC | PRN
  Administered 2023-01-18 (×2): 5 mg via INTRAVENOUS

## 2023-01-18 MED ORDER — ROCURONIUM BROMIDE 100 MG/10ML IV SOLN
INTRAVENOUS | Status: DC | PRN
  Administered 2023-01-18: 50 mg via INTRAVENOUS
  Administered 2023-01-18: 20 mg via INTRAVENOUS
  Administered 2023-01-18: 10 mg via INTRAVENOUS

## 2023-01-18 MED ORDER — FAMOTIDINE 20 MG PO TABS
20.0000 mg | ORAL_TABLET | Freq: Once | ORAL | Status: AC
  Administered 2023-01-18: 20 mg via ORAL

## 2023-01-18 MED ORDER — HYDROCODONE-ACETAMINOPHEN 5-325 MG PO TABS: 1.0000 | ORAL_TABLET | Freq: Four times a day (QID) | ORAL | 0 refills | Status: DC | PRN

## 2023-01-18 MED ORDER — PROPOFOL 10 MG/ML IV BOLUS
INTRAVENOUS | Status: AC
  Filled 2023-01-18: qty 20

## 2023-01-18 MED ORDER — OXYCODONE HCL 5 MG PO TABS: 5.0000 mg | ORAL_TABLET | Freq: Once | ORAL | Status: DC | PRN

## 2023-01-18 MED ORDER — FAMOTIDINE 20 MG PO TABS
ORAL_TABLET | ORAL | Status: AC
  Filled 2023-01-18: qty 1

## 2023-01-18 MED ORDER — SUGAMMADEX SODIUM 200 MG/2ML IV SOLN
INTRAVENOUS | Status: DC | PRN
  Administered 2023-01-18: 200 mg via INTRAVENOUS

## 2023-01-18 MED ORDER — FUROSEMIDE 10 MG/ML IJ SOLN
INTRAMUSCULAR | Status: AC
  Filled 2023-01-18: qty 4

## 2023-01-18 MED ORDER — FENTANYL CITRATE (PF) 100 MCG/2ML IJ SOLN
INTRAMUSCULAR | Status: DC | PRN
  Administered 2023-01-18 (×2): 50 ug via INTRAVENOUS

## 2023-01-18 MED ORDER — STERILE WATER FOR IRRIGATION IR SOLN
Status: DC | PRN
  Administered 2023-01-18: 1000 mL

## 2023-01-18 MED ORDER — CEFAZOLIN SODIUM-DEXTROSE 2-4 GM/100ML-% IV SOLN
INTRAVENOUS | Status: AC
  Filled 2023-01-18: qty 100

## 2023-01-18 MED ORDER — LIDOCAINE HCL (CARDIAC) PF 100 MG/5ML IV SOSY
PREFILLED_SYRINGE | INTRAVENOUS | Status: DC | PRN
  Administered 2023-01-18: 100 mg via INTRAVENOUS

## 2023-01-18 MED ORDER — SODIUM CHLORIDE 0.9 % IR SOLN
Status: DC | PRN
  Administered 2023-01-18 (×9): 3000 mL

## 2023-01-18 MED ORDER — DEXAMETHASONE SODIUM PHOSPHATE 10 MG/ML IJ SOLN
INTRAMUSCULAR | Status: AC
  Filled 2023-01-18: qty 1

## 2023-01-18 MED ORDER — ONDANSETRON HCL 4 MG/2ML IJ SOLN
INTRAMUSCULAR | Status: DC | PRN
  Administered 2023-01-18: 4 mg via INTRAVENOUS

## 2023-01-18 MED ORDER — FUROSEMIDE 10 MG/ML IJ SOLN
INTRAMUSCULAR | Status: DC | PRN
  Administered 2023-01-18: 10 mg via INTRAMUSCULAR

## 2023-01-18 MED ORDER — LACTATED RINGERS IV SOLN: INTRAVENOUS | Status: DC

## 2023-01-18 MED ORDER — FENTANYL CITRATE (PF) 100 MCG/2ML IJ SOLN: 25.0000 ug | INTRAMUSCULAR | Status: DC | PRN

## 2023-01-18 MED ORDER — LIDOCAINE HCL (PF) 2 % IJ SOLN
INTRAMUSCULAR | Status: AC
  Filled 2023-01-18: qty 5

## 2023-01-18 MED ORDER — CHLORHEXIDINE GLUCONATE 0.12 % MT SOLN
15.0000 mL | Freq: Once | OROMUCOSAL | Status: AC
  Administered 2023-01-18: 15 mL via OROMUCOSAL

## 2023-01-18 MED ORDER — CEFAZOLIN SODIUM-DEXTROSE 1-4 GM/50ML-% IV SOLN
INTRAVENOUS | Status: AC
  Filled 2023-01-18: qty 50

## 2023-01-18 SURGICAL SUPPLY — 36 items
ADAPTER IRRIG TUBE 2 SPIKE SOL (ADAPTER) ×2 IMPLANT
ADPR TBG 2 SPK PMP STRL ASCP (ADAPTER) ×2
BAG DRN LRG CPC RND TRDRP CNTR (MISCELLANEOUS)
BAG DRN RND TRDRP ANRFLXCHMBR (UROLOGICAL SUPPLIES)
BAG URINE DRAIN 2000ML AR STRL (UROLOGICAL SUPPLIES) IMPLANT
BAG URO DRAIN 4000ML (MISCELLANEOUS) IMPLANT
CATH FOL 2WAY LX 20X30 (CATHETERS) IMPLANT
CATH FOL 2WAY LX 22X30 (CATHETERS) IMPLANT
CATH FOLEY 3WAY 30CC 22FR (CATHETERS) IMPLANT
CATH URETL OPEN END 4X70 (CATHETERS) ×1 IMPLANT
CONTAINER COLLECT MORCELLATR (MISCELLANEOUS) ×1 IMPLANT
DRAPE 3/4 80X56 (DRAPES) ×1 IMPLANT
DRAPE UTILITY 15X26 TOWEL STRL (DRAPES) IMPLANT
FIBER LASER MOSES 550 DFL (Laser) ×1 IMPLANT
FILTER OVERFLOW MORCELLATOR (FILTER) ×1 IMPLANT
GLOVE BIO SURGEON STRL SZ 6.5 (GLOVE) ×2 IMPLANT
GOWN STRL REUS W/ TWL LRG LVL3 (GOWN DISPOSABLE) ×2 IMPLANT
GOWN STRL REUS W/TWL LRG LVL3 (GOWN DISPOSABLE) ×2
HOLDER FOLEY CATH W/STRAP (MISCELLANEOUS) ×1 IMPLANT
IV NS IRRIG 3000ML ARTHROMATIC (IV SOLUTION) ×4 IMPLANT
KIT TURNOVER CYSTO (KITS) ×1 IMPLANT
MBRN O SEALING YLW 17 FOR INST (MISCELLANEOUS) ×1
MEMBRANE SLNG YLW 17 FOR INST (MISCELLANEOUS) ×1 IMPLANT
MORCELLATOR COLLECT CONTAINER (MISCELLANEOUS) ×1
MORCELLATOR OVERFLOW FILTER (FILTER) ×1
MORCELLATOR ROTATION 4.75 335 (MISCELLANEOUS) ×1 IMPLANT
PACK CYSTO AR (MISCELLANEOUS) ×1 IMPLANT
SET CYSTO W/LG BORE CLAMP LF (SET/KITS/TRAYS/PACK) IMPLANT
SET IRRIG Y TYPE TUR BLADDER L (SET/KITS/TRAYS/PACK) ×1 IMPLANT
SLEEVE PROTECTION STRL DISP (MISCELLANEOUS) ×2 IMPLANT
SURGILUBE 2OZ TUBE FLIPTOP (MISCELLANEOUS) ×1 IMPLANT
SYR TOOMEY IRRIG 70ML (MISCELLANEOUS) ×1
SYRINGE TOOMEY IRRIG 70ML (MISCELLANEOUS) ×1 IMPLANT
TUBE PUMP MORCELLATOR PIRANHA (TUBING) ×1 IMPLANT
WATER STERILE IRR 1000ML POUR (IV SOLUTION) ×1 IMPLANT
WATER STERILE IRR 500ML POUR (IV SOLUTION) ×1 IMPLANT

## 2023-01-18 NOTE — Transfer of Care (Signed)
Immediate Anesthesia Transfer of Care Note  Patient: David Mccullough  Procedure(s) Performed: HOLEP-LASER ENUCLEATION OF THE PROSTATE WITH MORCELLATION (Prostate)  Patient Location: PACU  Anesthesia Type:General  Level of Consciousness: drowsy  Airway & Oxygen Therapy: Patient Spontanous Breathing and Patient connected to face mask oxygen  Post-op Assessment: Report given to RN and Post -op Vital signs reviewed and stable  Post vital signs: Reviewed and stable  Last Vitals:  Vitals Value Taken Time  BP 129/67 01/18/23 1146  Temp    Pulse 73 01/18/23 1148  Resp 16 01/18/23 1148  SpO2 100 % 01/18/23 1148  Vitals shown include unfiled device data.  Last Pain:  Vitals:   01/18/23 0837  TempSrc: Temporal  PainSc: 0-No pain         Complications: No notable events documented.

## 2023-01-18 NOTE — Anesthesia Postprocedure Evaluation (Signed)
Anesthesia Post Note  Patient: David Mccullough  Procedure(s) Performed: HOLEP-LASER ENUCLEATION OF THE PROSTATE WITH MORCELLATION (Prostate)  Patient location during evaluation: PACU Anesthesia Type: General Level of consciousness: awake and alert Pain management: pain level controlled Vital Signs Assessment: post-procedure vital signs reviewed and stable Respiratory status: spontaneous breathing, nonlabored ventilation, respiratory function stable and patient connected to nasal cannula oxygen Cardiovascular status: blood pressure returned to baseline and stable Postop Assessment: no apparent nausea or vomiting Anesthetic complications: no  No notable events documented.   Last Vitals:  Vitals:   01/18/23 1200 01/18/23 1215  BP: 113/65 125/68  Pulse: 69 67  Resp: 13 10  Temp:    SpO2: 99% 100%    Last Pain:  Vitals:   01/18/23 1215  TempSrc:   PainSc: 0-No pain                 Stephanie Coup

## 2023-01-18 NOTE — Anesthesia Preprocedure Evaluation (Signed)
Anesthesia Evaluation  Patient identified by MRN, date of birth, ID band Patient awake    Reviewed: Allergy & Precautions, NPO status , Patient's Chart, lab work & pertinent test results  History of Anesthesia Complications Negative for: history of anesthetic complications  Airway Mallampati: III  TM Distance: >3 FB Neck ROM: full  Mouth opening: Limited Mouth Opening  Dental  (+) Chipped, Dental Advidsory Given   Pulmonary neg pulmonary ROS, neg sleep apnea, neg COPD, Patient abstained from smoking.Not current smoker, former smoker   Pulmonary exam normal breath sounds clear to auscultation       Cardiovascular Exercise Tolerance: Good hypertension, Pt. on medications + CAD, + Past MI, + CABG and +CHF  + dysrhythmias Atrial Fibrillation  Rhythm:Regular Rate:Normal - Systolic murmurs TRANSTHORACIC ECHOCARDIOGRAM performed on 03/24/2018 1. Left ventricle: The cavity size was normal. There was mild concentric hypertrophy. Systolic function was normal. Theestimated ejection fraction was in the range of 55% to 60%. Wall motion was normal; there were no regional wall motion  abnormalities. Left ventricular diastolic function parameters were normal.  2. Mitral valve: There was mild regurgitation. Valve area by pressure half-time: 1.98 cm^2.  3. Left atrium: The atrium was moderately dilated.  4. Right ventricle: The cavity size was normal. Wall thickness was normal. Systolic function was normal.  5. Pulmonary arteries: Systolic pressure was mildly increased. PA peak pressure: 37 mm Hg (S).  6. Pericardium, extracardiac: A mild pericardial effusion was identified lateral to the left ventricle. Features were not consistent with tamponade physiology.  CORONARY ARTERY BYPASS GRAFTING PROCEDURE performed on 03/22/2018 1. 3 vessel CABG procedure  LIMA-LAD  SVG-OM  SVG-RI   Neuro/Psych    Depression    negative neurological ROS  negative  psych ROS   GI/Hepatic Neg liver ROS,GERD  Controlled,,  Endo/Other  negative endocrine ROSneg diabetes    Renal/GU Renal disease     Musculoskeletal   Abdominal   Peds  Hematology negative hematology ROS (+)   Anesthesia Other Findings Note from Quentin Mulling:  David Mccullough is a 77 y.o. male who is submitted for pre-surgical anesthesia review and clearance prior to him undergoing the above procedure. Patient is a Former Smoker (25 pack years; quit 06/1987). Pertinent PMH includes: CAD (s/p CABG), NSTEMI, atrial fibrillation/flutter (s/p ablation), IRBBB, cardiomyopathy, mild cardiomegaly, SVT, aortic atherosclerosis, HTN, HLD, CKD-III, RIGHT solitary pulmonary nodule, GERD (uses CaCO3 tablets PRN), hiatal hernia, papillary bladder tumor, LEFT renal mass (Bx proven RCC; s/p LEFT nephrectomy), BPH, nephrolithiasis, cervical spinal stenosis (C6-C7), lumbar DDD, depression, insomnia.    Patient is followed by cardiology [name], MD). He was last seen in the cardiology clinic on 12/16/2022; notes reviewed. At the time of his clinic visit, patient doing well overall from a cardiovascular perspective. Patient complained of being "weak and tired". He was experiencing episodes of mild exertional dyspnea. Patient denied any chest pain, PND, orthopnea, palpitations, significant peripheral edema, vertiginous symptoms, or presyncope/syncope. Patient with a past medical history significant for cardiovascular diagnoses. Documented physical exam was grossly benign, providing no evidence of acute exacerbation and/or decompensation of the patient's known cardiovascular conditions.    PMH significant for atrial flutter. He underwent an ablation procedure while living in [place] in approximately 2009. Since ablation, patient denies recurrence.     Patient suffered an NSTEMI on 03/14/2018. He underwent a diagnostic left heart catheterization on 03/15/2018 that revealed an ef of 50% with inferior  hypokinesis. There was multivessel CAD noted; 80% pLAD, 99% OM1, and 100% pRCA. PCI  was attempted, however failed. Initially there was TIMI-1 flow, however during interventional attempt, flow degraded to TIMI-0. Patient was transferred to [facility] for consultation with CVTS regarding high risk PCI versus surgical revascularization.      Patient ultimately underwent a 3 vessel CABG on 03/22/2018. LIMA-LAD, SVG-OM, and SVG-RI bypass grafts were placed.     Last TTE was performed on 11/12/2022 revealing a mildly left ventricular systolic function with an EF of 45%. There was global hypokinesis. Diastolic function parameters normal. There was moderate biatrial and mild right ventricular enlargement. Moderate mitral and tricuspid, in addition to trivial aortic and pulmonic, valve regurgitation observed. All transvalvular gradients were noted to be normal providing no evidence suggestive of valvular stenosis. RVSP 33.4 mmHg. Aorta normal in size with no evidence of aneurysmal dilatation.     Myocardial perfusion imaging study performed on 11/12/2022 demonstrated an mildly left ventricular systolic function with an LVEF of 44%. Inferolateral basal walls. Stress imaging revealed moderate inferolateral basal wall defect with no significant areas of redistribution or reversible ischemia. Baseline ECG revealed atrial fibrillation. Study determined to be intermediate risk.     Cardiac MRI performed on 11/18/2022 revealing reduced left ventricular systolic function with an LVEF of 41%. There was global hypokinesis, in addition to akinesis of the inferolateral wall. Near transmural LGE in the basal inferior and inferolateral left ventricular myocardium comprising about 10 grams (`8%) of the total myocardial mass. Finding consistent with prior myocardial infarction in the basal inferior and inferolateral left ventricular myocardium.    Patient with an atrial fibrillation diagnosis; CHA2DS2-VASc Score = 5 (age x 2,  HTN, prior MI, vascular disease history). Cardiac rate and rhythm had been being maintained intrinsically without pharmacological intervention. Following recent event monitor study and cardiac MRI, the decision was made to start patient on antiarrythmic therapy using amiodarone. He was also started on oral DOAC using apixaban. Blood pressure well controlled at 122/76 mmHg on currently prescribed ARB (losartan) monotherapy. He is on atorvastatin + omega-3 fatty acid capsule daily for his HLD diagnosis and further ASCVD prevention. Patient is not diabetic. Patient does not have an OSAH diagnosis. Patient is able to complete all of his  ADL/IADLs without cardiovascular limitation.  Per the [facility], patient is able to achieve at least 4 METS of physical activity without experiencing any significant degree of angina/anginal equivalent symptoms. No other changes were made to his medication regimen during his visit with cardiology.  Patient scheduled to follow-up with outpatient cardiology in 2 months or sooner if needed.   David Mccullough is scheduled for an HOLEP-LASER ENUCLEATION OF THE PROSTATE WITH MORCELLATION on 01/18/2023 with Dr. [name], MD.  Given patient's past medical history significant for cardiovascular diagnoses, presurgical cardiac clearance was sought by the PAT team. Per cardiology, "this patient is optimized for surgery and may proceed with the planned procedural course with a ACCEPTABLE risk of significant perioperative cardiovascular complications".     Again, this patient is on daily DOAC therapy. He has been instructed on recommendations for holding his apixaban for 2 days prior to his procedure with plans to restart as soon as postoperative bleeding risk felt to be minimized by his attending surgeon. The patient has been instructed that his last dose of his apixaban should be on 01/15/2023.   Patient denies previous perioperative complications with anesthesia in the past. In review of the  available records, it is noted that patient underwent a general anesthetic course here at [facility] (ASA  III) in 08/2021 without documented complications.      Past Medical History: No date: Aortic atherosclerosis (HCC) 07/03/2021: Bladder tumor     Comment:  a.) cystoscopy revealed 1.5 cm relatively flat papillary              tumor lateral to UO (LEFT); felt to be consistent with               RCC No date: BPH (benign prostatic hyperplasia) 03/15/2018: CAD (coronary artery disease)     Comment:  a.) NSTEMI 03/14/2018 --> LHC --> EF 50% with inf HK;               80% pLAD, 99% OM1, 100% pRCA --> failed PCI (TIMI-1               reduced to TIMI-0); transferred to [facility] and               underwent 3v CABG (LIMA-LAD, SVG-OM, SVG-RI) on               03/22/2018; b.) cMRI 11/18/2022: EF 41%, glob HK, basal               and inferolateral wall AK. Near transmural LGE in basal               inferior and inferolat LV myocardium (10 g; ~8%               myocardial mass) c/w prior infarct 03/15/2018: Cardiomyopathy (HCC)     Comment:  a.) LHC 03/15/2018: EF 50%; b.) TTE 03/17/2018: EF               55-60%; c.) TEE 03/22/2018: 50-55%; d.) TTE 11/12/2022:               EF 45%; e.) cMRI 11/18/2022: EF 41% No date: Cervical spinal stenosis     Comment:  a.) C6-C7 No date: CKD (chronic kidney disease), stage III (HCC) No date: Colon polyps No date: DDD (degenerative disc disease), lumbar No date: Depression No date: Elevated PSA No date: Enlarged prostate No date: GERD (gastroesophageal reflux disease) No date: Hiatal hernia No date: Hyperlipidemia No date: Hypertension No date: Incomplete right bundle branch block (RBBB) No date: Insomnia     Comment:  a.) takes trazodone PRN No date: Long term current use of amiodarone No date: Long term current use of anticoagulant     Comment:  a.) apixaban No date: Mild cardiomegaly No date:  Nephrolithiasis 03/14/2018: NSTEMI (non-ST elevated myocardial infarction) (HCC)     Comment:  a.) LHC 03/15/2018 --> 80% pLAD, 99% OM1, 100% pRCA -->               failed PCI and Tx'd to [facility] for CVTS consult;               underwent 3v CABG (LIMA-LAD, SVG-OM, SVG-RI) on               03/25/2018. No date: Persistent atrial fibrillation (HCC)     Comment:  a.) CHA2DS2-VASc = 5 (age x2, HTN, prior MI, vascular               disease history). b.) rate/rhythm maintained on oral               amiodarone; chronically anticoagulated using apixaban 05/27/2021: Renal cell carcinoma, left (HCC)     Comment:  a.) CT urogram --> 4.1 x 3.6 x 4.7 cm mass at  LEFT               kidney pole. b.) CNB (+) for RCC, conventional clear cell              type, nuclear grade II. 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 d.) PET CT               07/31/2021 revealed FDG avid LEFT renal mass (SUV 4.12);               e.) s/p LEFT nephrectomy 09/08/2021 -> pathology (+) for               stage 1 RCC 05/27/2021: Right lower lobe pulmonary nodule     Comment:  a.) CT chest --> measured 9 mm. 03/22/2018: S/P CABG x 3     Comment:  a.) 3v CABG: LIMA-LAD, SVG-OM, SVG-RI No date: Sleep apnea 2009: SVT (supraventricular tachycardia)     Comment:  a.) s/p SVT ablation while living in [place] No date: Umbilical hernia  Past Surgical History: No date: COLONOSCOPY 03/22/2018: CORONARY ARTERY BYPASS GRAFT; N/A     Comment:  Procedure: CORONARY ARTERY BYPASS GRAFTING (CABG) x 3               WITH ENDOSCOPIC HARVESTING OF RIGHT GREATER SAPHENOUS               VEIN;  Surgeon: Kerin Perna, MD;  Location: [facility] OR;                Service: Open Heart Surgery;  Laterality: N/A; 03/16/2018: CORONARY STENT INTERVENTION; N/A     Comment:  Procedure: CORONARY STENT INTERVENTION;  Surgeon:                Alwyn Pea, MD;  Location: [facility] INVASIVE CV LAB;               Service: Cardiovascular;  Laterality: N/A; 09/08/2021: LAPAROSCOPIC NEPHRECTOMY, HAND ASSISTED; Left     Comment:  Procedure: HAND ASSISTED LAPAROSCOPIC NEPHRECTOMY;                Surgeon: [name], MD;  Location: [facility] ORS;                Service: Urology;  Laterality: Left; 03/22/2018: LEFT ATRIAL APPENDAGE OCCLUSION; N/A     Comment:  Procedure: LEFT ATRIAL APPENDAGE OCCLUSION USING 40 MM               ATRICURE ATRICLIP;  Surgeon: Kerin Perna, MD;                Location: [facility] OR;  Service: Open Heart Surgery;                Laterality: N/A; 03/15/2018: LEFT HEART CATH AND CORONARY ANGIOGRAPHY; N/A     Comment:  Procedure: LEFT HEART CATH AND CORONARY ANGIOGRAPHY and               PCI stent;  Surgeon: Alwyn Pea, MD;  Location:               [facility] INVASIVE CV LAB;  Service: Cardiovascular;                Laterality: N/A; 2009: SVT  ABLATION; N/A     Comment:  Procedure: SVT ABLATION; Location: performed while               living in [place]. 03/22/2018: TEE WITHOUT CARDIOVERSION; N/A     Comment:  Procedure: TRANSESOPHAGEAL ECHOCARDIOGRAM (TEE);                Surgeon: Donata Clay, Theron Arista, MD;  Location: [facility] OR;                Service: Open Heart Surgery;  Laterality: N/A; 07/28/2021: TRANSURETHRAL RESECTION OF BLADDER TUMOR; N/A     Comment:  Procedure: TRANSURETHRAL RESECTION OF BLADDER TUMOR               (TURBT)WITH  GEMCITABINE;  Surgeon: [name], MD;               Location: [facility] ORS;  Service: Urology;  Laterality: N/A;  BMI    Body Mass Index: 29.02 kg/m      Reproductive/Obstetrics negative OB ROS                             Anesthesia Physical Anesthesia Plan  ASA: 3  Anesthesia Plan: General   Post-op Pain Management:    Induction: Intravenous  PONV Risk Score and Plan: 3 and Ondansetron, Dexamethasone and Treatment may vary due to age or  medical condition  Airway Management Planned: Oral ETT  Additional Equipment: None  Intra-op Plan:   Post-operative Plan: Extubation in OR  Informed Consent: I have reviewed the patients History and Physical, chart, labs and discussed the procedure including the risks, benefits and alternatives for the proposed anesthesia with the patient or authorized representative who has indicated his/her understanding and acceptance.     Dental advisory given  Plan Discussed with: CRNA and Surgeon  Anesthesia Plan Comments: (Patient consented for risks of anesthesia including but not limited to:  - adverse reactions to medications - damage to eyes, teeth, lips or other oral mucosa - nerve damage due to positioning  - sore throat or hoarseness - Damage to heart, brain, nerves, lungs, other parts of body or loss of life  Patient voiced understanding.)       Anesthesia Quick Evaluation

## 2023-01-18 NOTE — Op Note (Signed)
Date of procedure: 01/18/23  Preoperative diagnosis:  BPH with BOO  Postoperative diagnosis:  same   Procedure: HoLEP with morcellation  Surgeon: [name], MD  Anesthesia: General  Complications: None  Intraoperative findings: Bilobar coaptation.  Bladder unremarkable other than for trabeculation and stellate scar from previous TUR site.  No bladder cancer recurrence noted today.  EBL: Minimal  Specimens: Prostate chips  Drains: 20 French two-way Foley catheter with 50 cc in the balloon  Indication: David Mccullough is a 77 y.o. patient with refractory BPH symptoms.  After reviewing the management options for treatment, he elected to proceed with the above surgical procedure(s). We have discussed the potential benefits and risks of the procedure, side effects of the proposed treatment, the likelihood of the patient achieving the goals of the procedure, and any potential problems that might occur during the procedure or recuperation. Informed consent has been obtained.  Description of procedure:  The patient was taken to the operating room and general anesthesia was induced.  The patient was placed in the dorsal lithotomy position, prepped and draped in the usual sterile fashion, and preoperative antibiotics were administered. A preoperative time-out was performed.     A 26 French resectoscope sheath using a blunt angled obturator was introduced without difficulty into the bladder.  The bladder was carefully inspected and noted to be moderately trabeculated.  There is an elevated bladder neck with a very small intravesical component.  The trigone was able to be visualized with some manipulation and the UOs were relatively close bladder neck itself.  The prostatic fossa had significant trilobar coaptation with greater than 5 cm prostatic length.  A 500 m laser fiber was then brought in and using settings of 2 J's and60 Hz, 1 incision was created at the 6:00  of the bladder neck down  to the level of the bladder neck/capsular fibers.  The incision was carried down caudally meeting in the midline just above the verumontanum.     Next, a semilunar incision was created at the prostatic apex on the left side again freeing up the adenoma from the underlying capsule.  Care was taken to avoid any resection past the verumontanum.  This incision was carried around laterally and cranially towards the bladder neck.  Ultimately, I was able to complete the anterior commissure mucosa and the adenoma into the bladder creating a widely patent prostatic fossa.     Next, the same similar incision was created at the right prostatic apex.  This adenoma however ended up being enucleated and more of a piece wise fashion freeing up a large BPH nodules from the capsular fibers.  Once this was completed and cleared from the bladder neck, the prostatic fossa was noted to be widely patent.  Hemostasis was achieved using hemostatic fiber settings.  Bilateral UOs were visualized and free of any injury.  Finally, the 90 French resectoscope was exchanged for nephroscope and using the Piranha handpiece morcellator, the bladder was distended in each of the prostate chips were evacuated.  The bladder was irrigated several times and smaller jchips were clear for the bladder.  This point time, there were no residual fibers appreciated in the bladder.  Hemostasis was adequate.  10 mg of IV Lasix was administered to help with postoperative diuresis.  A 20 French two-way Foley catheter was then inserted over a catheter guide with 50 cc in the balloon.  The catheter irrigated easily and well.  Patient was then clean and dry, repositioned supine position, reversed  from anesthesia, taken to PACU in stable condition.   Plan: Patient will return to the office in 2 for voiding trial.  May resume his Eliquis tomorrow if his urine is clear.    [name], M.D.

## 2023-01-18 NOTE — Anesthesia Procedure Notes (Addendum)
Procedure Name: Intubation Date/Time: 01/18/2023 9:48 AM  Performed by: Elisabeth Pigeon, CRNAPre-anesthesia Checklist: Patient identified, Emergency Drugs available, Suction available and Patient being monitored Patient Re-evaluated:Patient Re-evaluated prior to induction Oxygen Delivery Method: Circle system utilized Preoxygenation: Pre-oxygenation with 100% oxygen Induction Type: IV induction Ventilation: Mask ventilation without difficulty Laryngoscope Size: McGraph and 4 Tube type: Oral Tube size: 7.0 mm Number of attempts: 1 Airway Equipment and Method: Stylet and Oral airway Placement Confirmation: ETT inserted through vocal cords under direct vision, positive ETCO2 and breath sounds checked- equal and bilateral Secured at: 22 cm Tube secured with: Tape Dental Injury: Teeth and Oropharynx as per pre-operative assessment

## 2023-01-18 NOTE — H&P (Signed)
01/18/23  RRR CTAB  David Mccullough [birth year] [id number]   Referring provider: Duanne Limerick, MD [address] [place],  [place] [address]      Chief Complaint  Patient presents with   Urinary Frequency      HPI: 77 year-old male with a personal history of prostate cancer and renal cell carcinoma returns today for follow-up regarding his urinary frequency.   He had a cystoscopy on 11/06/2022 that showed prostamegaly, no obvious tumor recurrence. Estimated prostate volume, based on CT scan, dimensions of his prostate are 6.3 by 5.7 by 5.2 cm. Calculated volume of 97 cc's.   He's undergoing surveillance imaging for his renal cell carcinoma with Dr. Orlie Dakin.    He continues to have significant issues with urinary frequency. Managed on Gemtesa, Flomax, and Finasteride.    He doesn't think the Leslye Peer has improved anything since he started taking it. He is unhappy with his urinary symptoms and ready to do something to improve them.      IPSS       Row Name 12/09/22 0900                   International Prostate Symptom Score    How often have you had the sensation of not emptying your bladder? About half the time        How often have you had to urinate less than every two hours? More than half the time        How often have you found you stopped and started again several times when you urinated? About half the time        How often have you found it difficult to postpone urination? Almost always        How often have you had a weak urinary stream? About half the time        How often have you had to strain to start urination? Less than half the time        How many times did you typically get up at night to urinate? 2 Times        Total IPSS Score 22               Quality of Life due to urinary symptoms    If you were to spend the rest of your life with your urinary condition just the way it is now how would you feel about that? 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 and underwent 3v CABG (LIMA-LAD, SVG-OM, SVG-RI) on 03/22/2018.   Colon polyps     Depression     Elevated PSA     Enlarged prostate  GERD (gastroesophageal reflux disease)     Hyperlipidemia     Hypertension     Nephrolithiasis     NSTEMI (non-ST elevated myocardial infarction) (HCC) 03/14/2018    a.) LHC 03/15/2018 --> 80% pLAD, 99% OM1, 100% pRCA --> failed PCI and Tx'd to [facility] for CVTS consult; underwent 3v CABG (LIMA-LAD, SVG-OM, SVG-RI) on 03/25/2018.   Persistent atrial fibrillation (HCC)      a.) CHA2DS2-VASc = 4 (age x2, HTN, prior MI). b.) rate/rhythm maintained without pharmacological intervention; no daily anticoagulation.   Renal cell cancer, left (HCC) 05/27/2021    a.) CT urogram --> 4.1 x 3.6 x 4.7 cm mass at LEFT kidney pole. b.) CNB (+) for RCC, conventional clear cell type, nuclear grade II. 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 d.) PET CT 07/31/2021 revealed FDG avid LEFT renal mass (SUV 4.12); scheduled for nephrectomy.   Right lower lobe pulmonary nodule 05/27/2021    a.) CT chest --> measured 9 mm.   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    a.) 3v CABG: LIMA-LAD, SVG-OM, SVG-RI   SVT (supraventricular tachycardia) 2009    a.) s/p SVT ablation while living in [place]           Surgical History:      Past Surgical History:  Procedure Laterality Date   COLONOSCOPY       CORONARY ARTERY BYPASS GRAFT N/A 03/22/2018    Procedure: CORONARY ARTERY BYPASS GRAFTING (CABG) x 3 WITH ENDOSCOPIC HARVESTING OF RIGHT GREATER SAPHENOUS VEIN;  Surgeon: Kerin Perna, MD;  Location: [facility] OR;  Service: Open Heart Surgery;  Laterality: N/A;   CORONARY STENT INTERVENTION N/A 03/16/2018    Procedure: CORONARY STENT INTERVENTION;  Surgeon: Alwyn Pea, MD;  Location: [facility] INVASIVE CV LAB;  Service: Cardiovascular;  Laterality: N/A;   LAPAROSCOPIC NEPHRECTOMY, HAND ASSISTED Left 09/08/2021    Procedure: HAND ASSISTED LAPAROSCOPIC NEPHRECTOMY;  Surgeon: [name], MD;  Location: [facility] ORS;  Service: Urology;  Laterality: Left;   LEFT ATRIAL APPENDAGE OCCLUSION N/A 03/22/2018    Procedure: LEFT ATRIAL APPENDAGE OCCLUSION USING 40 MM ATRICURE ATRICLIP;  Surgeon: Kerin Perna, MD;  Location: [facility] OR;  Service: Open Heart Surgery;  Laterality: N/A;   LEFT HEART CATH AND CORONARY ANGIOGRAPHY N/A 03/15/2018    Procedure: LEFT HEART CATH AND CORONARY ANGIOGRAPHY and PCI stent;  Surgeon: Alwyn Pea, MD;  Location: [facility] INVASIVE CV LAB;  Service: Cardiovascular;  Laterality: N/A;   SVT ABLATION N/A 2009    Procedure: SVT ABLATION; Location: performed while living in [place].   TEE WITHOUT CARDIOVERSION N/A 03/22/2018    Procedure: TRANSESOPHAGEAL ECHOCARDIOGRAM (TEE);  Surgeon: Donata Clay, Theron Arista, MD;  Location: [facility] OR;  Service: Open Heart Surgery;  Laterality: N/A;   TRANSURETHRAL RESECTION OF BLADDER TUMOR N/A 07/28/2021    Procedure: TRANSURETHRAL RESECTION OF BLADDER TUMOR (TURBT)WITH  GEMCITABINE;  Surgeon: [name], MD;  Location: [facility] ORS;  Service: Urology;  Laterality: N/A;          Home Medications:  Allergies as of 12/09/2022   No Known Allergies         Medication List           Accurate as of December 09, 2022 10:43 AM. If you have any  questions, ask your nurse or doctor.              STOP taking these medications     calcium  carbonate 500 MG chewable tablet Commonly known as: TUMS - dosed in mg elemental calcium           TAKE these medications     apixaban 5 MG Tabs tablet Commonly known as: ELIQUIS Take by mouth.    atorvastatin 80 MG tablet Commonly known as: LIPITOR TAKE 1 TABLET(80 MG) BY MOUTH DAILY AT 6 PM    finasteride 5 MG tablet Commonly known as: PROSCAR Take 1 tablet (5 mg total) by mouth daily.    FISH OIL PO Take 1 tablet by mouth daily.    Gemtesa 75 MG Tabs Generic drug: Vibegron TAKE 1 TABLET BY MOUTH DAILY    multivitamin with minerals tablet Take 1 tablet by mouth daily.    tamsulosin 0.4 MG Caps capsule Commonly known as: FLOMAX TAKE 1 CAPSULE(0.4 MG) BY MOUTH DAILY    traZODone 50 MG tablet Commonly known as: DESYREL Take 1 tablet by mouth daily             Family History:      Family History  Problem Relation Age of Onset   Heart disease Mother     Heart disease Father     Arrhythmia Sister     Heart disease Sister            Social History:  reports that he quit smoking about 35 years ago. His smoking use included cigarettes. He has a 25.00 pack-year smoking history. He has never been exposed to tobacco smoke. He has never used smokeless tobacco. He reports that he does not currently use alcohol. He reports that he does not currently use drugs.     Physical Exam: BP (!) 175/92   Pulse 81   Ht 6' (1.829 m)   Wt 213 lb 8 oz (96.8 kg)   BMI 28.96 kg/m   Constitutional:  Alert and oriented, No acute distress. HEENT: [place] AT, moist mucus membranes.  Trachea midline, no masses. Neurologic: Grossly intact, no focal deficits, moving all 4 extremities. Psychiatric: Normal mood and affect.     Assessment & Plan:     Urinary frequency  - We discussed alternatives including TURP vs. holmium laser enucleation of the prostate vs. greenlight laser ablation.  Differences between the surgical procedures were discussed as well as the risks and benefits of each.  He is most interested in HoLEP.  - We discussed the common postoperative course following holep including need for overnight Foley catheter, temporary worsening of irritative voiding symptoms, and occasional stress incontinence which typically lasts up to 6 months but can persist.  We discussed retrograde ejaculation and damage to surrounding structures including the urinary sphincter. Other uncommon complications including hematuria and urinary tract infection.  - He understands all of the above and is willing to proceed as planned. We reviewed the surgery in detail today including the preoperative, intraoperative, and postoperative course.  This will most likely be an outpatient procedure pending the degree of post op hematuria.  He will go home with catheter for a few days post op and will either be taught how to remove his own catheter or return to the office for catheter removal.  - Risk of bleeding, infection, damage surrounding structures, injury to the bladder/ urethral, bladder neck contracture, ureteral stricture, retrograde ejaculation, stress/ urge incontinence, exacerbation of irritative voiding symptoms were all discussed in detail.    Micah Flesher over doing Kegel exercises to start strengthening the pelvic floor prior to and after surgery. He will be able to  start coming off his medications.  - He will discuss this with his cardiologist, Dr. [name], to get a medical clearance regarding his Afib and blood thinners.   - Did a pre-op urine today.    2. Prostate cancer  - Enlarged prostate. Plan for HoLep procedure. Details above.   No follow-ups on file.     [facility] [address], [address] [place], [place] [address] [phone]

## 2023-01-18 NOTE — Discharge Instructions (Addendum)
Holmium Laser Enucleation of the Prostate (HoLEP)  HoLEP is a treatment for men with benign prostatic hyperplasia (BPH). The laser surgery removed blockages of urine flow, and is done without any incisions on the body.     What is HoLEP?  HoLEP is a type of laser surgery used to treat obstruction (blockage) of urine flow as a result of benign prostatic hyperplasia (BPH). In men with BPH, the prostate gland is not cancerous, but has become enlarged. An enlarged prostate can result in a number of urinary tract symptoms such as weak urinary stream, difficulty in starting urination, inability to urinate, frequent urination, or getting up at night to urinate.  HoLEP was developed in the 1990's as a more effective and less expensive surgical option for BPH, compared to other surgical options such as laser vaporization(PVP/greenlight laser), transurethral resection of the prostate(TURP), and open simple prostatectomy.   What happens during a HoLEP?  HoLEP requires general anesthesia ("asleep" throughout the procedure).   An antibiotic is given to reduce the risk of infection  A surgical instrument called a resectoscope is inserted through the urethra (the tube that carries urine from the bladder). The resectoscope has a camera that allows the surgeon to view the internal structure of the prostate gland, and to see where the incisions are being made during surgery.  The laser is inserted into the resectoscope and is used to enucleate (free up) the enlarged prostate tissue from the capsule (outer shell) and then to seal up any blood vessels. The tissue that has been removed is pushed back into the bladder.  A morcellator is placed through the resectoscope, and is used to suction out the prostate tissue that has been pushed into the bladder.  When the prostate tissue has been removed, the resectoscope is removed, and a foley catheter is placed to allow healing and drain the urine from the  bladder.     What happens after a HoLEP?  More than 90% of patients go home the same day a few hours after surgery. Less than 10% will be admitted to the hospital overnight for observation to monitor the urine, or if they have other medical problems.  Fluid is flushed through the catheter for about 1 hour after surgery to clear any blood from the urine. It is normal to have some blood in the urine after surgery. The need for blood transfusion is extremely rare.  Eating and drinking are permitted after the procedure once the patient has fully awakened from anesthesia.  The catheter is usually removed 2-3 days after surgery- the patient will come to clinic to have the catheter removed and make sure they can urinate on their own.  It is very important to drink lots of fluids after surgery for one week to keep the bladder flushed.  At first, there may be some burning with urination, but this typically improved within a few hours to days. Most patients do not have a significant amount of pain, and narcotic pain medications are rarely needed.  Symptoms of urinary frequency, urgency, and even leakage are NORMAL for the first few weeks after surgery as the bladder adjusts after having to work hard against blockage from the prostate for many years. This will improve, but can sometimes take several months.  The use of pelvic floor exercises (Kegel exercises) can help improve problems with urinary incontinence.   After catheter removal, patients will be seen at 6 weeks and 6 months for symptom check  No heavy lifting for   at least 2-3 weeks after surgery, however patients can walk and do light activities the first day after surgery. Return to work time depends on occupation.    What are the advantages of HoLEP?  HoLEP has been studied in many different parts of the world and has been shown to be a safe and effective procedure. Although there are many types of BPH surgeries available, HoLEP offers a  unique advantage in being able to remove a large amount of tissue without any incisions on the body, even in very large prostates, while decreasing the risk of bleeding and providing tissue for pathology (to look for cancer). This decreases the need for blood transfusions during surgery, minimizes hospital stay, and reduces the risk of needing repeat treatment.  What are the side effects of HoLEP?  Temporary burning and bleeding during urination. Some blood may be seen in the urine for weeks after surgery and is part of the healing process.  Urinary incontinence (inability to control urine flow) is expected in all patients immediately after surgery and they should wear pads for the first few days/weeks. This typically improves over the course of several weeks. Performing Kegel exercises can help decrease leakage from stress maneuvers such as coughing, sneezing, or lifting. The rate of long term leakage is very low. Patients may also have leakage with urgency and this may be treated with medication. The risk of urge incontinence can be dependent on several factors including age, prostate size, symptoms, and other medical problems.  Retrograde ejaculation or "backwards ejaculation." In 75% of cases, the patient will not see any fluid during ejaculation after surgery.  Erectile function is generally not significantly affected.   What are the risks of HoLEP?  Injury to the urethra or development of scar tissue at a later date  Injury to the capsule of the prostate (typically treated with longer catheterization).  Injury to the bladder or ureteral orifices (where the urine from the kidney drains out)  Infection of the bladder, testes, or kidneys  Return of urinary obstruction at a later date requiring another operation (<2%)  Need for blood transfusion or re-operation due to bleeding  Failure to relieve all symptoms and/or need for prolonged catheterization after surgery  5-15% of patients are  found to have previously undiagnosed prostate cancer in their specimen. Prostate cancer can be treated after HoLEP.  Standard risks of anesthesia including blood clots, heart attacks, etc  When should I call my doctor?  Fever over 101.3 degrees  Inability to urinate, or large blood clots in the urine  AMBULATORY SURGERY  DISCHARGE INSTRUCTIONS   The drugs that you were given will stay in your system until tomorrow so for the next 24 hours you should not:  Drive an automobile Make any legal decisions Drink any alcoholic beverage   You may resume regular meals tomorrow.  Today it is better to start with liquids and gradually work up to solid foods.  You may eat anything you prefer, but it is better to start with liquids, then soup and crackers, and gradually work up to solid foods.   Please notify your doctor immediately if you have any unusual bleeding, trouble breathing, redness and pain at the surgery site, drainage, fever, or pain not relieved by medication.    Additional Instructions:        Please contact your physician with any problems or Same Day Surgery at [phone], Monday through Friday 6 am to 4 pm, or [facility] [place] Main number   at [phone].  

## 2023-01-19 ENCOUNTER — Encounter: Payer: Self-pay | Admitting: Urology

## 2023-01-20 ENCOUNTER — Ambulatory Visit (INDEPENDENT_AMBULATORY_CARE_PROVIDER_SITE_OTHER): Payer: Medicare Other | Admitting: Physician Assistant

## 2023-01-20 ENCOUNTER — Encounter: Payer: Medicare Other | Admitting: Physician Assistant

## 2023-01-20 ENCOUNTER — Encounter: Payer: Self-pay | Admitting: Physician Assistant

## 2023-01-20 VITALS — Ht 72.0 in | Wt 214.0 lb

## 2023-01-20 DIAGNOSIS — N401 Enlarged prostate with lower urinary tract symptoms: Secondary | ICD-10-CM

## 2023-01-20 DIAGNOSIS — N138 Other obstructive and reflux uropathy: Secondary | ICD-10-CM | POA: Diagnosis not present

## 2023-01-20 LAB — BLADDER SCAN AMB NON-IMAGING: PVR: 77 WU

## 2023-01-20 NOTE — Progress Notes (Signed)
Catheter Removal  Patient is present today for a catheter removal.  50ml of water was drained from the balloon. A 20FR foley cath was removed from the bladder, no complications were noted. Patient tolerated well.  Performed by: Carman Ching, PA-C   Follow up: RTC this afternoon for PVR

## 2023-01-20 NOTE — Progress Notes (Signed)
Afternoon follow-up  Patient returned to clinic this afternoon for PVR. He has been pushing fluids. He has been able to urinate. He has not had urinary leakage. PVR 77mL.  Results for orders placed or performed in visit on 01/20/23  BLADDER SCAN AMB NON-IMAGING  Result Value Ref Range   PVR 77.0 WU    Voiding trial passed. Counseled patient on normal postoperative findings including dysuria, gross hematuria, and urinary urgency/leakage. Written and verbal resources provided today. Surgical pathology pending; will defer to Dr. Apolinar Junes to share results when available.   Follow up: Return in about 6 weeks (around 03/03/2023) for Postop f/u with Dr. Apolinar Junes.

## 2023-01-20 NOTE — Patient Instructions (Signed)

## 2023-01-21 ENCOUNTER — Other Ambulatory Visit: Payer: Self-pay | Admitting: Urology

## 2023-01-21 DIAGNOSIS — R3915 Urgency of urination: Secondary | ICD-10-CM

## 2023-01-21 NOTE — Telephone Encounter (Signed)
Should patient continue this medication post surgery?

## 2023-02-01 DIAGNOSIS — G4733 Obstructive sleep apnea (adult) (pediatric): Secondary | ICD-10-CM | POA: Diagnosis not present

## 2023-02-01 DIAGNOSIS — R918 Other nonspecific abnormal finding of lung field: Secondary | ICD-10-CM | POA: Diagnosis not present

## 2023-02-01 DIAGNOSIS — I480 Paroxysmal atrial fibrillation: Secondary | ICD-10-CM | POA: Diagnosis not present

## 2023-02-16 ENCOUNTER — Ambulatory Visit: Payer: Medicare Other | Admitting: Family Medicine

## 2023-02-16 DIAGNOSIS — I10 Essential (primary) hypertension: Secondary | ICD-10-CM | POA: Diagnosis not present

## 2023-02-16 DIAGNOSIS — I2510 Atherosclerotic heart disease of native coronary artery without angina pectoris: Secondary | ICD-10-CM | POA: Diagnosis not present

## 2023-02-16 DIAGNOSIS — E782 Mixed hyperlipidemia: Secondary | ICD-10-CM | POA: Diagnosis not present

## 2023-02-16 DIAGNOSIS — Z951 Presence of aortocoronary bypass graft: Secondary | ICD-10-CM | POA: Diagnosis not present

## 2023-02-16 DIAGNOSIS — R55 Syncope and collapse: Secondary | ICD-10-CM | POA: Diagnosis not present

## 2023-02-16 DIAGNOSIS — N183 Chronic kidney disease, stage 3 unspecified: Secondary | ICD-10-CM | POA: Diagnosis not present

## 2023-02-16 DIAGNOSIS — G4733 Obstructive sleep apnea (adult) (pediatric): Secondary | ICD-10-CM | POA: Diagnosis not present

## 2023-02-16 DIAGNOSIS — I214 Non-ST elevation (NSTEMI) myocardial infarction: Secondary | ICD-10-CM | POA: Diagnosis not present

## 2023-02-16 DIAGNOSIS — I4891 Unspecified atrial fibrillation: Secondary | ICD-10-CM | POA: Diagnosis not present

## 2023-02-16 DIAGNOSIS — R0602 Shortness of breath: Secondary | ICD-10-CM | POA: Diagnosis not present

## 2023-02-16 DIAGNOSIS — I480 Paroxysmal atrial fibrillation: Secondary | ICD-10-CM | POA: Diagnosis not present

## 2023-02-16 DIAGNOSIS — I429 Cardiomyopathy, unspecified: Secondary | ICD-10-CM | POA: Diagnosis not present

## 2023-02-16 MED ORDER — GEMTESA 75 MG PO TABS
1.0000 | ORAL_TABLET | Freq: Every day | ORAL | Status: DC
Start: 2023-02-16 — End: 2023-04-23

## 2023-02-16 NOTE — Addendum Note (Signed)
Addended by: Sueanne Margarita on: 02/16/2023 02:21 PM   Modules accepted: Orders

## 2023-02-16 NOTE — Telephone Encounter (Signed)
Pt calling asking if he should continue the gemtesa, per previous msg ok to continue, samples left at [facility] front desk for patient pick-up

## 2023-02-18 ENCOUNTER — Ambulatory Visit: Payer: Medicare Other | Admitting: Family Medicine

## 2023-03-04 ENCOUNTER — Ambulatory Visit (INDEPENDENT_AMBULATORY_CARE_PROVIDER_SITE_OTHER): Payer: Medicare Other | Admitting: Family Medicine

## 2023-03-04 ENCOUNTER — Encounter: Payer: Self-pay | Admitting: Family Medicine

## 2023-03-04 VITALS — BP 118/64 | HR 50 | Ht 72.0 in | Wt 217.0 lb

## 2023-03-04 DIAGNOSIS — G4700 Insomnia, unspecified: Secondary | ICD-10-CM

## 2023-03-04 DIAGNOSIS — E785 Hyperlipidemia, unspecified: Secondary | ICD-10-CM | POA: Diagnosis not present

## 2023-03-04 DIAGNOSIS — Z23 Encounter for immunization: Secondary | ICD-10-CM | POA: Diagnosis not present

## 2023-03-04 MED ORDER — TRAZODONE HCL 50 MG PO TABS
ORAL_TABLET | ORAL | 1 refills | Status: DC
Start: 2023-03-04 — End: 2023-08-20

## 2023-03-04 MED ORDER — ATORVASTATIN CALCIUM 80 MG PO TABS
ORAL_TABLET | ORAL | 1 refills | Status: DC
Start: 2023-03-04 — End: 2023-08-20

## 2023-03-04 NOTE — Progress Notes (Signed)
Date:  03/04/2023   Name:  David Mccullough   DOB:  December 03, 1945   MRN:  [id number]   Chief Complaint: Insomnia, Hyperlipidemia, and flu vacc need  Insomnia Primary symptoms: no fragmented sleep, no sleep disturbance, no difficulty falling asleep, no somnolence, no frequent awakening.   The current episode started more than one month. The problem occurs nightly. Past treatments include medication. The treatment provided moderate relief.  Hyperlipidemia This is a chronic problem. The current episode started more than 1 year ago. The problem is controlled. Recent lipid tests were reviewed and are normal. He has no history of chronic renal disease, diabetes, hypothyroidism, liver disease, obesity or nephrotic syndrome. There are no known factors aggravating his hyperlipidemia. Pertinent negatives include no chest pain, focal sensory loss, focal weakness, leg pain, myalgias or shortness of breath. Current antihyperlipidemic treatment includes statins. The current treatment provides moderate improvement of lipids. There are no compliance problems.  Risk factors for coronary artery disease include dyslipidemia.    Lab Results  Component Value Date   NA 135 10/17/2022   K 4.2 10/17/2022   CO2 24 10/17/2022   GLUCOSE 108 (H) 10/17/2022   BUN 30 (H) 10/17/2022   CREATININE 1.77 (H) 10/17/2022   CALCIUM 9.1 10/17/2022   EGFR 40 (L) 10/14/2021   GFRNONAA 39 (L) 10/17/2022   Lab Results  Component Value Date   CHOL 127 08/18/2022   HDL 34 (L) 08/18/2022   LDLCALC 76 08/18/2022   TRIG 87 08/18/2022   CHOLHDL 3.7 08/18/2022   Lab Results  Component Value Date   TSH 2.360 01/17/2019   Lab Results  Component Value Date   HGBA1C 5.2 03/17/2018   Lab Results  Component Value Date   WBC 10.1 10/17/2022   HGB 13.1 10/17/2022   HCT 38.5 (L) 10/17/2022   MCV 83.3 10/17/2022   PLT 185 10/17/2022   Lab Results  Component Value Date   ALT 16 08/18/2022   AST 19 08/18/2022   ALKPHOS 56  08/18/2022   BILITOT 0.7 08/18/2022   No results found for: "25OHVITD2", "25OHVITD3", "VD25OH"   Review of Systems  Constitutional:  Negative for chills and fever.  HENT:  Negative for drooling, ear discharge, ear pain and sore throat.   Respiratory:  Negative for cough, shortness of breath and wheezing.   Cardiovascular:  Negative for chest pain, palpitations and leg swelling.  Gastrointestinal:  Positive for constipation. Negative for abdominal pain, blood in stool, diarrhea and nausea.  Endocrine: Negative for polydipsia.  Genitourinary:  Negative for dysuria, frequency, hematuria and urgency.  Musculoskeletal:  Negative for back pain, myalgias and neck pain.  Skin:  Negative for rash.  Allergic/Immunologic: Negative for environmental allergies.  Neurological:  Negative for dizziness, focal weakness and headaches.  Hematological:  Does not bruise/bleed easily.  Psychiatric/Behavioral:  Negative for sleep disturbance and suicidal ideas. The patient has insomnia. The patient is not nervous/anxious.     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   NSTEMI (non-ST elevated myocardial infarction) (HCC) 03/14/2018    No Known Allergies  Past Surgical History:  Procedure Laterality Date   COLONOSCOPY     CORONARY ARTERY BYPASS GRAFT N/A 03/22/2018   Procedure: CORONARY ARTERY BYPASS GRAFTING (CABG) x 3 WITH ENDOSCOPIC HARVESTING OF RIGHT GREATER SAPHENOUS VEIN;  Surgeon:  Kerin Perna, MD;  Location: [facility] OR;  Service: Open Heart Surgery;  Laterality: N/A;   CORONARY STENT INTERVENTION N/A 03/16/2018   Procedure: CORONARY STENT INTERVENTION;  Surgeon: Alwyn Pea, MD;  Location: [facility] INVASIVE [facility] LAB;  Service: Cardiovascular;  Laterality: N/A;   HOLEP-LASER ENUCLEATION OF THE PROSTATE WITH MORCELLATION N/A  01/18/2023   Procedure: HOLEP-LASER ENUCLEATION OF THE PROSTATE WITH MORCELLATION;  Surgeon: [name], MD;  Location: [facility] ORS;  Service: Urology;  Laterality: N/A;   LAPAROSCOPIC NEPHRECTOMY, HAND ASSISTED Left 09/08/2021   Procedure: HAND ASSISTED LAPAROSCOPIC NEPHRECTOMY;  Surgeon: [name], MD;  Location: [facility] ORS;  Service: Urology;  Laterality: Left;   LEFT ATRIAL APPENDAGE OCCLUSION N/A 03/22/2018   Procedure: LEFT ATRIAL APPENDAGE OCCLUSION USING 40 MM ATRICURE ATRICLIP;  Surgeon: Kerin Perna, MD;  Location: [facility] OR;  Service: Open Heart Surgery;  Laterality: N/A;   LEFT HEART CATH AND CORONARY ANGIOGRAPHY N/A 03/15/2018   Procedure: LEFT HEART CATH AND CORONARY ANGIOGRAPHY and PCI stent;  Surgeon: Alwyn Pea, MD;  Location: [facility] CV LAB;  Service: Cardiovascular;  Laterality: N/A;   SVT ABLATION N/A 2009   Procedure: SVT ABLATION; Location: performed while living in [place].   TEE WITHOUT CARDIOVERSION N/A 03/22/2018   Procedure: TRANSESOPHAGEAL ECHOCARDIOGRAM (TEE);  Surgeon: Donata Clay, Theron Arista, MD;  Location: [facility] OR;  Service: Open Heart Surgery;  Laterality: N/A;   TRANSURETHRAL RESECTION OF BLADDER TUMOR N/A 07/28/2021   Procedure: TRANSURETHRAL RESECTION OF BLADDER TUMOR (TURBT)WITH  GEMCITABINE;  Surgeon: [name], MD;  Location: [facility] ORS;  Service: Urology;  Laterality: N/A;    Social History   Tobacco Use   Smoking status: Former    Current packs/day: 0.00    Average packs/day: 1 pack/day for 25.0 years (25.0 ttl pk-yrs)    Types: Cigarettes    Start date: 06/29/1962    Quit date: 06/30/1987    Years since quitting: 35.7    Passive exposure: Never   Smokeless tobacco: Never  Vaping Use   Vaping status: Never Used  Substance Use Topics   Alcohol use: Not Currently   Drug use: Not Currently     Medication list has been reviewed and updated.  Current Meds  Medication Sig   amiodarone (PACERONE) 200 MG tablet Take 200 mg by mouth  daily.   apixaban (ELIQUIS) 5 MG TABS tablet Take by mouth 2 (two) times daily.   atorvastatin (LIPITOR) 80 MG tablet TAKE 1 TABLET(80 MG) BY MOUTH DAILY AT 6 PM   losartan (COZAAR) 25 MG tablet Take 25 mg by mouth daily.   Multiple Vitamins-Minerals (MULTIVITAMIN WITH MINERALS) tablet Take 1 tablet by mouth daily.   Omega-3 Fatty Acids (FISH OIL PO) Take 1 tablet by mouth daily.   traZODone (DESYREL) 50 MG tablet Take 1 tablet by mouth daily   Vibegron (GEMTESA) 75 MG TABS Take 1 tablet (75 mg total) by mouth daily.       03/04/2023    1:13 PM 10/19/2022    9:07 AM 08/18/2022    9:21 AM 02/13/2022    2:39 PM  GAD 7 : Generalized Anxiety Score  Nervous, Anxious, on Edge 0 0 0 0  Control/stop worrying 0 0 0 1  Worry too much - different things 0 0 0 1  Trouble relaxing 0 0 0 0  Restless 0 0 0 0  Easily annoyed or irritable 0 0 0 0  Afraid - awful might happen 0 0 0 1  Total GAD  7 Score 0 0 0 3  Anxiety Difficulty Not difficult at all Not difficult at all Not difficult at all Not difficult at all       03/04/2023    1:12 PM 10/21/2022   10:31 AM 10/19/2022    9:07 AM  Depression screen PHQ 2/9  Decreased Interest 0 0 1  Down, Depressed, Hopeless 0 0 1  PHQ - 2 Score 0 0 2  Altered sleeping 0 0 0  Tired, decreased energy 0 0 1  Change in appetite 0 0 0  Feeling bad or failure about yourself  0 0 0  Trouble concentrating 0 0 0  Moving slowly or fidgety/restless 0 0 0  Suicidal thoughts 0 0 0  PHQ-9 Score 0 0 3  Difficult doing work/chores Not difficult at all Not difficult at all Not difficult at all    BP Readings from Last 3 Encounters:  03/04/23 118/64  01/18/23 128/66  12/09/22 (!) 175/92    Physical Exam Vitals and nursing note reviewed.  HENT:     Head: Normocephalic.     Right Ear: Tympanic membrane and external ear normal.     Left Ear: Tympanic membrane and external ear normal.     Nose: Nose normal.     Mouth/Throat:     Pharynx: No oropharyngeal exudate or  posterior oropharyngeal erythema.  Eyes:     General: No scleral icterus.       Right eye: No discharge.        Left eye: No discharge.     Conjunctiva/sclera: Conjunctivae normal.     Pupils: Pupils are equal, round, and reactive to light.  Neck:     Thyroid: No thyromegaly.     Vascular: No JVD.     Trachea: No tracheal deviation.  Cardiovascular:     Rate and Rhythm: Normal rate and regular rhythm.     Heart sounds: Normal heart sounds. No murmur heard.    No friction rub. No gallop.  Pulmonary:     Effort: No respiratory distress.     Breath sounds: Normal breath sounds. No wheezing, rhonchi or rales.  Abdominal:     General: Bowel sounds are normal.     Palpations: Abdomen is soft. There is no mass.     Tenderness: There is no abdominal tenderness. There is no guarding or rebound.  Musculoskeletal:        General: No tenderness. Normal range of motion.     Cervical back: Normal range of motion and neck supple.  Lymphadenopathy:     Cervical: No cervical adenopathy.  Skin:    General: Skin is warm.     Findings: No rash.  Neurological:     Mental Status: He is alert.     Deep Tendon Reflexes: Reflexes are normal and symmetric.     Wt Readings from Last 3 Encounters:  03/04/23 217 lb (98.4 kg)  01/20/23 214 lb (97.1 kg)  01/18/23 214 lb (97.1 kg)    BP 118/64   Pulse (!) 50   Ht 6' (1.829 m)   Wt 217 lb (98.4 kg)   SpO2 95%   BMI 29.43 kg/m   Assessment and Plan:  1. Dyslipidemia Chronic.  Controlled.  Stable.  Asymptomatic.  Tolerating medication well.  Continue atorvastatin 80 mg once a day.  Recheck of previous lipid panel is acceptable and will recheck in 6 months. - atorvastatin (LIPITOR) 80 MG tablet; TAKE 1 TABLET(80 MG) BY MOUTH DAILY AT 6 PM  Dispense: 90 tablet; Refill: 1  2. Insomnia, unspecified type Chronic.  Controlled.  Stable.  Patient has recently began CPAP and he is doing well with this however feels that he would like to continue the  trazodone 1/2 tablet as needed at night.  Will recheck and 6 months. - traZODone (DESYREL) 50 MG tablet; Take 1 tablet by mouth daily  Dispense: 90 tablet; Refill: 1  3. Influenza vaccination administered at current visit Discussed and administered - Flu Vaccine Trivalent High Dose (Fluad)    Elizabeth Sauer, MD

## 2023-03-05 ENCOUNTER — Ambulatory Visit (INDEPENDENT_AMBULATORY_CARE_PROVIDER_SITE_OTHER): Payer: Medicare Other | Admitting: Urology

## 2023-03-05 ENCOUNTER — Other Ambulatory Visit: Payer: Self-pay

## 2023-03-05 ENCOUNTER — Other Ambulatory Visit
Admission: RE | Admit: 2023-03-05 | Discharge: 2023-03-05 | Disposition: A | Discharge status: Home / Self Care | Payer: Medicare Other | Attending: Urology | Admitting: Urology

## 2023-03-05 VITALS — BP 150/81 | HR 72 | Ht 72.0 in | Wt 217.1 lb

## 2023-03-05 DIAGNOSIS — Z8551 Personal history of malignant neoplasm of bladder: Secondary | ICD-10-CM | POA: Diagnosis not present

## 2023-03-05 DIAGNOSIS — R3915 Urgency of urination: Secondary | ICD-10-CM

## 2023-03-05 DIAGNOSIS — N138 Other obstructive and reflux uropathy: Secondary | ICD-10-CM

## 2023-03-05 DIAGNOSIS — R3129 Other microscopic hematuria: Secondary | ICD-10-CM

## 2023-03-05 DIAGNOSIS — N401 Enlarged prostate with lower urinary tract symptoms: Secondary | ICD-10-CM | POA: Diagnosis not present

## 2023-03-05 DIAGNOSIS — Z87438 Personal history of other diseases of male genital organs: Secondary | ICD-10-CM

## 2023-03-05 DIAGNOSIS — N4289 Other specified disorders of prostate: Secondary | ICD-10-CM

## 2023-03-05 DIAGNOSIS — R8281 Pyuria: Secondary | ICD-10-CM

## 2023-03-05 LAB — URINALYSIS, COMPLETE (UACMP) WITH MICROSCOPIC
Bilirubin Urine: NEGATIVE
Glucose, UA: NEGATIVE mg/dL
Ketones, ur: NEGATIVE mg/dL
Nitrite: NEGATIVE
Specific Gravity, Urine: 1.015 (ref 1.005–1.030)
Squamous Epithelial / HPF: NONE SEEN /HPF (ref 0–5)
pH: 6.5 (ref 5.0–8.0)

## 2023-03-05 LAB — BLADDER SCAN AMB NON-IMAGING: Scan Result: 78

## 2023-03-05 NOTE — Progress Notes (Signed)
Marcelle Overlie Plume,acting as a scribe for [name], MD.,have documented all relevant documentation on the behalf of [name], MD,as directed by  [name], MD while in the presence of [name], MD.  03/05/2023 2:03 PM   Curt Bears [birth year] [id number]  Referring provider: Duanne Limerick, MD [address] [place],  [place] [address]  Chief Complaint  Patient presents with   Post-op Follow-up    HPI: 77 year-old male who presents today for a post-op follow up following HoLEP.   He was taken to the operating room on 01/18/2023 to address his enlarged prostate refractory urinary symptoms. His preoperative urinary prostate volume was 97 cc's. He also has multiple other medical issues including renal cell carcinoma as well as a history of bladder cancer, for which he will have a cystoscopy due in a few months. He was previously managed on Flomax, gemtesa, and finasteride. He is now off of Flomax and finasteride, but continues on gemtesa.  Surgical pathology is consistent a rare focus of atypical glands, not diagnostic for prostate cancer along with BPH. There were 28 grams of tissue.  His past PSA was in 03/2021 and it was 4.41.  Today, his urinary symptoms are significantly improved. He reports a recent episode of hematuria and occasional urinary incontinence.  Results for orders placed or performed in visit on 03/05/23  Bladder Scan (Post Void Residual) in office  Result Value Ref Range   Scan Result 78 ml   Results for orders placed or performed during the hospital encounter of 03/05/23  Urinalysis, Complete w Microscopic -  Result Value Ref Range   Color, Urine YELLOW YELLOW   APPearance CLEAR CLEAR   Specific Gravity, Urine 1.015 1.005 - 1.030   pH 6.5 5.0 - 8.0   Glucose, UA NEGATIVE NEGATIVE mg/dL   Hgb urine dipstick MODERATE (A) NEGATIVE   Bilirubin Urine NEGATIVE NEGATIVE   Ketones, ur NEGATIVE NEGATIVE mg/dL   Protein, ur TRACE (A)  NEGATIVE mg/dL   Nitrite NEGATIVE NEGATIVE   Leukocytes,Ua TRACE (A) NEGATIVE   Squamous Epithelial / HPF NONE SEEN 0 - 5 /HPF   WBC, UA 6-10 0 - 5 WBC/hpf   RBC / HPF 21-50 0 - 5 RBC/hpf   Bacteria, UA MANY (A) NONE SEEN   Budding Yeast PRESENT     IPSS     Row Name 03/05/23 1300         International Prostate Symptom Score   How often have you had the sensation of not emptying your bladder? Not at All     How often have you had to urinate less than every two hours? Less than 1 in 5 times     How often have you found you stopped and started again several times when you urinated? Not at All     How often have you found it difficult to postpone urination? About half the time     How often have you had a weak urinary stream? Not at All     How often have you had to strain to start urination? Not at All     How many times did you typically get up at night to urinate? 2 Times     Total IPSS Score 6       Quality of Life due to urinary symptoms   If you were to spend the rest of your life with your urinary condition just the way it is now how would you  feel about that? Mostly Satisfied              Score:  1-7 Mild 8-19 Moderate 20-35 Severe    PMH: Past Medical History:  Diagnosis Date   Aortic atherosclerosis (HCC)    Bladder tumor 07/03/2021   a.) cystoscopy revealed 1.5 cm relatively flat papillary tumor lateral to UO (LEFT); felt to be consistent with RCC   BPH (benign prostatic hyperplasia)    CAD (coronary artery disease) 03/15/2018   a.) NSTEMI 03/14/2018 --> LHC --> EF 50% with inf HK; 80% pLAD, 99% OM1, 100% pRCA --> failed PCI (TIMI-1 reduced to TIMI-0); transferred to [facility] and underwent 3v CABG (LIMA-LAD, SVG-OM, SVG-RI) on 03/22/2018; b.) cMRI 11/18/2022: EF 41%, glob HK, basal and inferolateral wall AK. Near transmural LGE in basal inferior and inferolat LV myocardium (10 g; ~8% myocardial mass) c/w prior infarct   Cardiomyopathy (HCC) 03/15/2018    a.) LHC 03/15/2018: EF 50%; b.) TTE 03/17/2018: EF 55-60%; c.) TEE 03/22/2018: 50-55%; d.) TTE 11/12/2022: EF 45%; e.) cMRI 11/18/2022: EF 41%   Cervical spinal stenosis    a.) C6-C7   CKD (chronic kidney disease), stage III (HCC)    Colon polyps    DDD (degenerative disc disease), lumbar    Depression    Elevated PSA    Enlarged prostate    GERD (gastroesophageal reflux disease)    Hiatal hernia    Hyperlipidemia    Hypertension    Incomplete right bundle branch block (RBBB)    Insomnia    a.) takes trazodone PRN   Long term current use of amiodarone    Long term current use of anticoagulant    a.) apixaban   Mild cardiomegaly    Nephrolithiasis    NSTEMI (non-ST elevated myocardial infarction) (HCC) 03/14/2018   a.) LHC 03/15/2018 --> 80% pLAD, 99% OM1, 100% pRCA --> failed PCI and Tx'd to [facility] for CVTS consult; underwent 3v CABG (LIMA-LAD, SVG-OM, SVG-RI) on 03/25/2018.   Persistent atrial fibrillation (HCC)    a.) CHA2DS2-VASc = 5 (age x2, HTN, prior MI, vascular disease history). 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 b.) CNB (+) for RCC, conventional clear cell type, nuclear grade II. 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 d.) PET CT 07/31/2021 revealed FDG avid LEFT renal mass (SUV 4.12); e.) s/p LEFT nephrectomy 09/08/2021 -> pathology (+) for stage 1 RCC   Right lower lobe pulmonary nodule 05/27/2021   a.) CT chest --> measured 9 mm.   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   a.) 3v CABG: LIMA-LAD, SVG-OM, SVG-RI   Sleep apnea    SVT (supraventricular tachycardia) 2009   a.) s/p SVT ablation while living in [place]   Umbilical hernia     Surgical History: Past Surgical History:  Procedure Laterality Date   COLONOSCOPY      CORONARY ARTERY BYPASS GRAFT N/A 03/22/2018   Procedure: CORONARY ARTERY BYPASS GRAFTING (CABG) x 3 WITH ENDOSCOPIC HARVESTING OF RIGHT GREATER SAPHENOUS VEIN;  Surgeon: Kerin Perna, MD;  Location: [facility] OR;  Service: Open Heart Surgery;  Laterality: N/A;   CORONARY STENT INTERVENTION N/A 03/16/2018   Procedure: CORONARY STENT INTERVENTION;  Surgeon: Alwyn Pea, MD;  Location: [facility];  Service: Cardiovascular;  Laterality:  N/A;   HOLEP-LASER ENUCLEATION OF THE PROSTATE WITH MORCELLATION N/A 01/18/2023   Procedure: HOLEP-LASER ENUCLEATION OF THE PROSTATE WITH MORCELLATION;  Surgeon: [name], MD;  Location: [facility] ORS;  Service: Urology;  Laterality: N/A;   LAPAROSCOPIC NEPHRECTOMY, HAND ASSISTED Left 09/08/2021   Procedure: HAND ASSISTED LAPAROSCOPIC NEPHRECTOMY;  Surgeon: [name], MD;  Location: [facility] ORS;  Service: Urology;  Laterality: Left;   LEFT ATRIAL APPENDAGE OCCLUSION N/A 03/22/2018   Procedure: LEFT ATRIAL APPENDAGE OCCLUSION USING 40 MM ATRICURE ATRICLIP;  Surgeon: Kerin Perna, MD;  Location: [facility] OR;  Service: Open Heart Surgery;  Laterality: N/A;   LEFT HEART CATH AND CORONARY ANGIOGRAPHY N/A 03/15/2018   Procedure: LEFT HEART CATH AND CORONARY ANGIOGRAPHY and PCI stent;  Surgeon: Alwyn Pea, MD;  Location: [facility];  Service: Cardiovascular;  Laterality: N/A;   SVT ABLATION N/A 2009   Procedure: SVT ABLATION; Location: performed while living in [place].   TEE WITHOUT CARDIOVERSION N/A 03/22/2018   Procedure: TRANSESOPHAGEAL ECHOCARDIOGRAM (TEE);  Surgeon: Donata Clay, Theron Arista, MD;  Location: [facility] OR;  Service: Open Heart Surgery;  Laterality: N/A;   TRANSURETHRAL RESECTION OF BLADDER TUMOR N/A 07/28/2021   Procedure: TRANSURETHRAL RESECTION OF BLADDER TUMOR (TURBT)WITH  GEMCITABINE;  Surgeon: [name], MD;  Location: [facility] ORS;  Service: Urology;  Laterality: N/A;    Home Medications:  Allergies as of 03/05/2023   No Known  Allergies      Medication List        Accurate as of March 05, 2023  2:03 PM. If you have any questions, ask your nurse or doctor.          amiodarone 200 MG tablet Commonly known as: PACERONE Take 200 mg by mouth daily.   apixaban 5 MG Tabs tablet Commonly known as: ELIQUIS Take by mouth 2 (two) times daily.   atorvastatin 80 MG tablet Commonly known as: LIPITOR TAKE 1 TABLET(80 MG) BY MOUTH DAILY AT 6 PM   FISH OIL PO Take 1 tablet by mouth daily.   Gemtesa 75 MG Tabs Generic drug: Vibegron Take 1 tablet (75 mg total) by mouth daily.   losartan 25 MG tablet Commonly known as: COZAAR Take 25 mg by mouth daily.   multivitamin with minerals tablet Take 1 tablet by mouth daily.   traZODone 50 MG tablet Commonly known as: DESYREL Take 1 tablet by mouth daily        Family History: Family History  Problem Relation Age of Onset   Heart disease Mother    Heart disease Father    Arrhythmia Sister    Heart disease Sister     Social History:  reports that he quit smoking about 35 years ago. His smoking use included cigarettes. He started smoking about 60 years ago. He has a 25 pack-year smoking history. He has never been exposed to tobacco smoke. He has never used smokeless tobacco. He reports that he does not currently use alcohol. He reports that he does not currently use drugs.   Physical Exam: BP (!) 150/81   Pulse 72   Ht 6' (1.829 m)   Wt 217 lb 2 oz (98.5 kg)   BMI 29.45 kg/m   Constitutional:  Alert and oriented, No acute distress. HEENT: [place] AT, moist mucus membranes.  Trachea midline, no masses. Neurologic: Grossly intact, no focal deficits, moving all 4 extremities. Psychiatric: Normal mood and affect.   Assessment & Plan:    1. BPH with outlet obstruction - Status post  HoLEP - He is doing really well. He is off BPH medications. - Continues to have some urgency - Monitor symptoms and consider discontinuing Gemtese in a month if  symptoms improve.  2. History of bladder cancer - Scheduled for cystoscopy in October/November.  3. Atypical prostate tissue - Not diagnostic of prostate cancer - Check another PSA prior to his next cystoscopy for a new baseline  4. Pyuria - Urinalysis today still shows some WBC, RBC, and bacteria, although he is asymptomatic - We will send a culture to rule out infection  Return for cystoscopy.  I have reviewed the above documentation for accuracy and completeness, and I agree with the above.   [name], MD   [facility] [address] [place], [place] [address] [phone]

## 2023-03-06 LAB — URINE CULTURE: Culture: NO GROWTH

## 2023-03-30 ENCOUNTER — Telehealth: Payer: Self-pay | Admitting: Urology

## 2023-03-30 NOTE — Telephone Encounter (Signed)
Patient called to request refill for [facility]. He requested refill be sent to [facility] instead of [facility].

## 2023-03-30 NOTE — Telephone Encounter (Signed)
Patient was given samples at his last office visit, is it ok to send medication in? Cysto is scheduled for 05/07/23

## 2023-03-30 NOTE — Telephone Encounter (Signed)
My plan was for him to use Gemtesa for about a month specifically for management of his postoperative urinary urgency frequency symptoms and then wean off after that.  I like him to try without the medication and see how it goes.  If he absolutely feels like he needs it after being off it about a week, we can prescribe it at that point.  [name], MD

## 2023-03-30 NOTE — Telephone Encounter (Signed)
Called patient and left detailed message with this information

## 2023-04-07 DIAGNOSIS — R918 Other nonspecific abnormal finding of lung field: Secondary | ICD-10-CM | POA: Diagnosis not present

## 2023-04-07 DIAGNOSIS — R0602 Shortness of breath: Secondary | ICD-10-CM | POA: Diagnosis not present

## 2023-04-07 DIAGNOSIS — G4733 Obstructive sleep apnea (adult) (pediatric): Secondary | ICD-10-CM | POA: Diagnosis not present

## 2023-04-14 DIAGNOSIS — G4733 Obstructive sleep apnea (adult) (pediatric): Secondary | ICD-10-CM | POA: Diagnosis not present

## 2023-04-14 DIAGNOSIS — Z951 Presence of aortocoronary bypass graft: Secondary | ICD-10-CM | POA: Diagnosis not present

## 2023-04-14 DIAGNOSIS — I4891 Unspecified atrial fibrillation: Secondary | ICD-10-CM | POA: Diagnosis not present

## 2023-04-14 DIAGNOSIS — E782 Mixed hyperlipidemia: Secondary | ICD-10-CM | POA: Diagnosis not present

## 2023-04-14 DIAGNOSIS — N183 Chronic kidney disease, stage 3 unspecified: Secondary | ICD-10-CM | POA: Diagnosis not present

## 2023-04-14 DIAGNOSIS — Z8679 Personal history of other diseases of the circulatory system: Secondary | ICD-10-CM | POA: Diagnosis not present

## 2023-04-14 DIAGNOSIS — I429 Cardiomyopathy, unspecified: Secondary | ICD-10-CM | POA: Diagnosis not present

## 2023-04-14 DIAGNOSIS — I214 Non-ST elevation (NSTEMI) myocardial infarction: Secondary | ICD-10-CM | POA: Diagnosis not present

## 2023-04-14 DIAGNOSIS — Z9889 Other specified postprocedural states: Secondary | ICD-10-CM | POA: Diagnosis not present

## 2023-04-14 DIAGNOSIS — I2510 Atherosclerotic heart disease of native coronary artery without angina pectoris: Secondary | ICD-10-CM | POA: Diagnosis not present

## 2023-04-14 DIAGNOSIS — I10 Essential (primary) hypertension: Secondary | ICD-10-CM | POA: Diagnosis not present

## 2023-04-15 ENCOUNTER — Ambulatory Visit: Payer: Medicare Other | Admitting: Family Medicine

## 2023-04-16 ENCOUNTER — Ambulatory Visit (INDEPENDENT_AMBULATORY_CARE_PROVIDER_SITE_OTHER): Payer: Medicare Other

## 2023-04-16 ENCOUNTER — Ambulatory Visit
Admission: EM | Admit: 2023-04-16 | Discharge: 2023-04-16 | Disposition: A | Discharge status: Home / Self Care | Payer: Medicare Other | Attending: Family Medicine | Admitting: Family Medicine

## 2023-04-16 DIAGNOSIS — S2241XA Multiple fractures of ribs, right side, initial encounter for closed fracture: Secondary | ICD-10-CM | POA: Diagnosis not present

## 2023-04-16 DIAGNOSIS — R0782 Intercostal pain: Secondary | ICD-10-CM | POA: Diagnosis not present

## 2023-04-16 DIAGNOSIS — W19XXXA Unspecified fall, initial encounter: Secondary | ICD-10-CM

## 2023-04-16 MED ORDER — OXYCODONE-ACETAMINOPHEN 5-325 MG PO TABS: 1.0000 | ORAL_TABLET | Freq: Three times a day (TID) | ORAL | 0 refills | Status: DC | PRN

## 2023-04-16 NOTE — Discharge Instructions (Addendum)
You have multiple rib fractures.  Do deep breathing exercises 10 deep breathes every hour while awake. This helps prevent pneumonia, which is a common complication of a broken rib. Try to cough several times a day, holding a pillow against the injured area.  Stop by the pharmacy to pick up your pain medication. Follow up with your primary care provider.  Go to the emergency room if your pain suddenly gets worse, you have chest pain or difficulty breathing.

## 2023-04-16 NOTE — ED Triage Notes (Signed)
Pt c/o Fall on 04/13/23  Pt states that he tripped  Pt denies any bruising, SOB, Shoulder, hip, or arm pain.  Pt states that it is painful to take deep breathes.

## 2023-04-16 NOTE — ED Provider Notes (Signed)
[facility]    CSN: [id number] Arrival date & time: 04/16/23  0809      History   Chief Complaint Chief Complaint  Patient presents with   Fall    HPI  HPI David Mccullough is a 77 y.o. male.   David Mccullough presents for right side pain after a fall on Tuesday.  He fell onto his right side after tripping into [place] where they [place] on cars. He needed help getting up.  There was no loss of consciousness. He hit hit head on the right side but denies headache, extremity weakness, numbness, tingling, chest pain or shortness of breath.    When he cough or sneezing he has increased right side pain.  Took tylenol (1-2 pills a day). Did not hear any pop or abnormal sounds with his injury.       Past Medical History:  Diagnosis Date   Aortic atherosclerosis (HCC)    Bladder tumor 07/03/2021   a.) cystoscopy revealed 1.5 cm relatively flat papillary tumor lateral to UO (LEFT); felt to be consistent with RCC   BPH (benign prostatic hyperplasia)    CAD (coronary artery disease) 03/15/2018   a.) NSTEMI 03/14/2018 --> LHC --> EF 50% with inf HK; 80% pLAD, 99% OM1, 100% pRCA --> failed PCI (TIMI-1 reduced to TIMI-0); transferred to [facility] and underwent 3v CABG (LIMA-LAD, SVG-OM, SVG-RI) on 03/22/2018; b.) cMRI 11/18/2022: EF 41%, glob HK, basal and inferolateral wall AK. Near transmural LGE in basal inferior and inferolat LV myocardium (10 g; ~8% myocardial mass) c/w prior infarct   Cardiomyopathy (HCC) 03/15/2018   a.) LHC 03/15/2018: EF 50%; b.) TTE 03/17/2018: EF 55-60%; c.) TEE 03/22/2018: 50-55%; d.) TTE 11/12/2022: EF 45%; e.) cMRI 11/18/2022: EF 41%   Cervical spinal stenosis    a.) C6-C7   CKD (chronic kidney disease), stage III (HCC)    Colon polyps    DDD (degenerative disc disease), lumbar    Depression    Elevated PSA    Enlarged prostate    GERD (gastroesophageal reflux disease)    Hiatal hernia    Hyperlipidemia    Hypertension    Incomplete right bundle branch  block (RBBB)    Insomnia    a.) takes trazodone PRN   Long term current use of amiodarone    Long term current use of anticoagulant    a.) apixaban   Mild cardiomegaly    Nephrolithiasis    NSTEMI (non-ST elevated myocardial infarction) (HCC) 03/14/2018   a.) LHC 03/15/2018 --> 80% pLAD, 99% OM1, 100% pRCA --> failed PCI and Tx'd to [facility] for CVTS consult; underwent 3v CABG (LIMA-LAD, SVG-OM, SVG-RI) on 03/25/2018.   Persistent atrial fibrillation (HCC)    a.) CHA2DS2-VASc = 5 (age x2, HTN, prior MI, vascular disease history). 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 b.) CNB (+) for RCC, conventional clear cell type, nuclear grade II. 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 d.) PET CT 07/31/2021 revealed FDG avid LEFT renal mass (SUV 4.12); e.) s/p LEFT nephrectomy 09/08/2021 -> pathology (+) for stage 1 RCC   Right lower lobe pulmonary nodule 05/27/2021   a.) CT chest --> measured 9 mm.   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   NSTEMI (non-ST elevated myocardial infarction) (HCC) 03/14/2018    Past Surgical History:  Procedure Laterality Date   COLONOSCOPY     CORONARY ARTERY BYPASS GRAFT N/A 03/22/2018   Procedure: CORONARY ARTERY BYPASS GRAFTING (CABG) x 3 WITH ENDOSCOPIC HARVESTING OF RIGHT GREATER SAPHENOUS VEIN;  Surgeon: Kerin Perna, MD;  Location: [facility]  OR;  Service: Open Heart Surgery;  Laterality: N/A;   CORONARY STENT INTERVENTION N/A 03/16/2018   Procedure: CORONARY STENT INTERVENTION;  Surgeon: Alwyn Pea, MD;  Location: [facility];  Service: Cardiovascular;  Laterality: N/A;   HOLEP-LASER ENUCLEATION OF THE PROSTATE WITH MORCELLATION N/A 01/18/2023   Procedure: HOLEP-LASER ENUCLEATION OF THE PROSTATE WITH MORCELLATION;  Surgeon: [name], MD;  Location: [facility] ORS;  Service: Urology;  Laterality: N/A;   LAPAROSCOPIC NEPHRECTOMY, HAND ASSISTED Left 09/08/2021   Procedure: HAND ASSISTED LAPAROSCOPIC NEPHRECTOMY;  Surgeon: [name], MD;  Location: [facility] ORS;  Service: Urology;  Laterality: Left;   LEFT ATRIAL APPENDAGE OCCLUSION N/A 03/22/2018   Procedure: LEFT ATRIAL APPENDAGE OCCLUSION USING 40 MM ATRICURE ATRICLIP;  Surgeon: Kerin Perna, MD;  Location: [facility] OR;  Service: Open Heart Surgery;  Laterality: N/A;   LEFT HEART CATH AND CORONARY ANGIOGRAPHY N/A 03/15/2018   Procedure: LEFT HEART CATH AND CORONARY ANGIOGRAPHY and PCI stent;  Surgeon: Alwyn Pea, MD;  Location: [facility];  Service: Cardiovascular;  Laterality: N/A;   SVT ABLATION N/A 2009   Procedure: SVT ABLATION; Location: performed while living in [place].   TEE WITHOUT CARDIOVERSION N/A 03/22/2018   Procedure: TRANSESOPHAGEAL ECHOCARDIOGRAM (TEE);  Surgeon: Donata Clay, Theron Arista, MD;  Location: [facility] OR;  Service: Open Heart Surgery;  Laterality: N/A;   TRANSURETHRAL RESECTION OF BLADDER TUMOR N/A 07/28/2021   Procedure: TRANSURETHRAL RESECTION OF BLADDER TUMOR (TURBT)WITH  GEMCITABINE;  Surgeon: [name], MD;  Location: [facility] ORS;  Service: Urology;  Laterality: N/A;       Home Medications    Prior to Admission medications   Medication Sig Start Date End Date Taking? Authorizing Provider  amiodarone (PACERONE) 200 MG tablet Take 200 mg by mouth daily.   Yes [provider]  apixaban (ELIQUIS) 5 MG TABS tablet Take  by mouth 2 (two) times daily. 10/20/22  Yes [provider]  atorvastatin (LIPITOR) 80 MG tablet TAKE 1 TABLET(80 MG) BY MOUTH DAILY AT 6 PM 03/04/23  Yes Duanne Limerick, MD  losartan (COZAAR) 25 MG tablet Take 25 mg by mouth daily.   Yes [provider]  Multiple Vitamins-Minerals (MULTIVITAMIN WITH MINERALS) tablet Take 1 tablet by mouth daily.   Yes [provider]  Omega-3 Fatty Acids (FISH OIL PO) Take 1 tablet by mouth daily.   Yes [provider]  oxyCODONE-acetaminophen (PERCOCET/ROXICET) 5-325 MG tablet Take 1 tablet by mouth every 8 (eight) hours as needed for severe pain (pain score 7-10). 04/16/23  Yes Katha Cabal, DO  traZODone (DESYREL) 50 MG tablet Take 1 tablet by mouth daily 03/04/23  Yes Duanne Limerick, MD  Vibegron (GEMTESA) 75 MG TABS Take 1 tablet (75 mg total) by mouth  daily. 02/16/23  Yes [name], MD    Family History Family History  Problem Relation Age of Onset   Heart disease Mother    Heart disease Father    Arrhythmia Sister    Heart disease Sister     Social History Social History   Tobacco Use   Smoking status: Former    Current packs/day: 0.00    Average packs/day: 1 pack/day for 25.0 years (25.0 ttl pk-yrs)    Types: Cigarettes    Start date: 06/29/1962    Quit date: 06/30/1987    Years since quitting: 35.8    Passive exposure: Never   Smokeless tobacco: Never  Vaping Use   Vaping status: Never Used  Substance Use Topics   Alcohol use: Not Currently   Drug use: Not Currently     Allergies   Patient has no known allergies.   Review of Systems Review of Systems: :negative unless otherwise stated in HPI.      Physical Exam Triage Vital Signs ED Triage Vitals  Encounter Vitals Group     BP --      Systolic BP Percentile --      Diastolic BP Percentile --      Pulse --      Resp --      Temp --      Temp src --      SpO2 --      Weight 04/16/23 0821 215 lb (97.5 kg)     Height 04/16/23  0821 6' (1.829 m)     Head Circumference --      Peak Flow --      Pain Score 04/16/23 0820 10     Pain Loc --      Pain Education --      Exclude from Growth Chart --    No data found.  Updated Vital Signs BP (!) 168/65 (BP Location: Left Arm)   Pulse (!) 57   Temp 98 F (36.7 C) (Oral)   Ht 6' (1.829 m)   Wt 97.5 kg   SpO2 99%   BMI 29.16 kg/m   Visual Acuity Right Eye Distance:   Left Eye Distance:   Bilateral Distance:    Right Eye Near:   Left Eye Near:    Bilateral Near:     Physical Exam GEN: pleasant elderly male, in no acute distress  HENT: Moist mucous membranes, abrasion of the right forehead, no hematomas or skull deformity CV: regular rhythm, bradycardic RESP: no increased work of breathing, clear to ascultation bilaterally ABD: anterior upper right abdominal tenderness, no bruising, no CVA tenderness  MSK: good ROM of upper and lower extremities  SKIN: warm, dry, no rash on visible skin NEURO: alert, moves all extremities appropriately     UC Treatments / Results  Labs (all labs ordered are listed, but only abnormal results are displayed) Labs Reviewed - No data to display  EKG   Radiology DG Ribs Unilateral W/Chest Right  Result Date: 04/16/2023 CLINICAL DATA:  Right rib pain after fall. EXAM: RIGHT RIBS AND CHEST - 3+ VIEW COMPARISON:  April 25, 2018. FINDINGS: Mildly displaced fractures are seen involving the anterior portions of the right eighth and ninth ribs. There is no evidence of pneumothorax or pleural effusion. Both lungs are clear. Heart size and mediastinal contours are within normal limits. IMPRESSION: Mildly displaced right eighth and ninth rib fractures. Electronically Signed   By: Lupita Raider M.D.   On: 04/16/2023 10:00  Procedures Procedures (including critical care time)  Medications Ordered in UC Medications - No data to display  Initial Impression / Assessment and Plan / UC Course  I have reviewed the triage  vital signs and the nursing notes.  Pertinent labs & imaging results that were available during my care of the patient were reviewed by me and considered in my medical decision making (see chart for details).      Pt is a 77 y.o.  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  On exam, pt has tenderness at  concerning for rib fractures.   Obtained right rib series with chest shoulder plain films.  Personally interpreted by me were remarkable for multiple fractures 7th, 8th and possibly 10th but chest without pleural effusion or pneumothorax.  Radiologist report reviewed and notes anterior right eighth and ninth rib fractures are mildly displaced.    Patient to gradually return to normal activities, as tolerated and continue ordinary activities within the limits permitted by pain. Prescribed Percocet  for pain. Advised to take deep breathes every hour to prevent pneumonia. Understanding voiced.  He does take Eliquis and I worry about a possible hematoma here as well.    Patient to follow up with his primary care provider.  Return and ED precautions given. Understanding voiced. Discussed MDM, treatment plan and plan for follow-up with patient who agrees with plan.   Final Clinical Impressions(s) / UC Diagnoses   Final diagnoses:  Fall, initial encounter  Closed fracture of multiple ribs of right side, initial encounter     Discharge Instructions      You have multiple rib fractures.  Do deep breathing exercises 10 deep breathes every hour while awake. This helps prevent pneumonia, which is a common complication of a broken rib. Try to cough several times a day, holding a pillow against the injured area.  Stop by the pharmacy to pick up your pain medication. Follow up with your primary care provider.  Go to the emergency room if your pain suddenly gets worse, you have chest pain or difficulty breathing.        ED Prescriptions      Medication Sig Dispense Auth. Provider   oxyCODONE-acetaminophen (PERCOCET/ROXICET) 5-325 MG tablet Take 1 tablet by mouth every 8 (eight) hours as needed for severe pain (pain score 7-10). 12 tablet Faline Langer, DO      I have reviewed the PDMP during this encounter.   Katha Cabal, DO 04/16/23 1655

## 2023-04-19 NOTE — Plan of Care (Signed)
[facility] Tonsillectomy/Adenoidectomy, Postoperative PEDS care plan entered in error.

## 2023-05-03 MED ORDER — SODIUM CHLORIDE 0.9 % IV SOLN: INTRAVENOUS | Status: DC

## 2023-05-04 ENCOUNTER — Encounter: Payer: Self-pay | Admitting: Internal Medicine

## 2023-05-04 ENCOUNTER — Ambulatory Visit: Payer: Medicare Other | Admitting: General Practice

## 2023-05-04 ENCOUNTER — Encounter
Admission: RE | Disposition: A | Discharge status: Home / Self Care | Payer: Self-pay | Source: Home / Self Care | Attending: Internal Medicine

## 2023-05-04 ENCOUNTER — Ambulatory Visit
Admission: RE | Admit: 2023-05-04 | Discharge: 2023-05-04 | Disposition: A | Discharge status: Home / Self Care | Payer: Medicare Other | Attending: Internal Medicine | Admitting: Internal Medicine

## 2023-05-04 DIAGNOSIS — Z7901 Long term (current) use of anticoagulants: Secondary | ICD-10-CM | POA: Insufficient documentation

## 2023-05-04 DIAGNOSIS — I2510 Atherosclerotic heart disease of native coronary artery without angina pectoris: Secondary | ICD-10-CM | POA: Diagnosis not present

## 2023-05-04 DIAGNOSIS — Z01810 Encounter for preprocedural cardiovascular examination: Secondary | ICD-10-CM | POA: Diagnosis not present

## 2023-05-04 DIAGNOSIS — N183 Chronic kidney disease, stage 3 unspecified: Secondary | ICD-10-CM | POA: Diagnosis not present

## 2023-05-04 DIAGNOSIS — R001 Bradycardia, unspecified: Secondary | ICD-10-CM | POA: Insufficient documentation

## 2023-05-04 DIAGNOSIS — I480 Paroxysmal atrial fibrillation: Secondary | ICD-10-CM | POA: Diagnosis not present

## 2023-05-04 DIAGNOSIS — Z79899 Other long term (current) drug therapy: Secondary | ICD-10-CM | POA: Insufficient documentation

## 2023-05-04 DIAGNOSIS — Z87891 Personal history of nicotine dependence: Secondary | ICD-10-CM | POA: Insufficient documentation

## 2023-05-04 DIAGNOSIS — I252 Old myocardial infarction: Secondary | ICD-10-CM | POA: Insufficient documentation

## 2023-05-04 DIAGNOSIS — I4891 Unspecified atrial fibrillation: Secondary | ICD-10-CM | POA: Diagnosis not present

## 2023-05-04 DIAGNOSIS — I255 Ischemic cardiomyopathy: Secondary | ICD-10-CM | POA: Insufficient documentation

## 2023-05-04 DIAGNOSIS — I4819 Other persistent atrial fibrillation: Secondary | ICD-10-CM

## 2023-05-04 DIAGNOSIS — G4733 Obstructive sleep apnea (adult) (pediatric): Secondary | ICD-10-CM | POA: Insufficient documentation

## 2023-05-04 DIAGNOSIS — I509 Heart failure, unspecified: Secondary | ICD-10-CM | POA: Diagnosis not present

## 2023-05-04 DIAGNOSIS — I129 Hypertensive chronic kidney disease with stage 1 through stage 4 chronic kidney disease, or unspecified chronic kidney disease: Secondary | ICD-10-CM | POA: Diagnosis not present

## 2023-05-04 DIAGNOSIS — I130 Hypertensive heart and chronic kidney disease with heart failure and stage 1 through stage 4 chronic kidney disease, or unspecified chronic kidney disease: Secondary | ICD-10-CM | POA: Diagnosis not present

## 2023-05-04 DIAGNOSIS — Z951 Presence of aortocoronary bypass graft: Secondary | ICD-10-CM | POA: Insufficient documentation

## 2023-05-04 DIAGNOSIS — E782 Mixed hyperlipidemia: Secondary | ICD-10-CM | POA: Diagnosis not present

## 2023-05-04 DIAGNOSIS — E669 Obesity, unspecified: Secondary | ICD-10-CM | POA: Insufficient documentation

## 2023-05-04 HISTORY — PX: CARDIOVERSION: SHX1299

## 2023-05-04 SURGERY — CARDIOVERSION
Anesthesia: Monitor Anesthesia Care

## 2023-05-04 SURGERY — CARDIOVERSION
Anesthesia: General

## 2023-05-04 MED ORDER — PROPOFOL 10 MG/ML IV BOLUS
INTRAVENOUS | Status: AC
  Filled 2023-05-04: qty 20

## 2023-05-04 MED ORDER — LIDOCAINE HCL (PF) 2 % IJ SOLN
INTRAMUSCULAR | Status: AC
Start: 2023-05-04 — End: ?
  Filled 2023-05-04: qty 5

## 2023-05-04 MED ORDER — PROPOFOL 10 MG/ML IV BOLUS
INTRAVENOUS | Status: DC | PRN
  Administered 2023-05-04: 60 mg via INTRAVENOUS

## 2023-05-04 NOTE — Anesthesia Preprocedure Evaluation (Signed)
Anesthesia Evaluation  Patient identified by MRN, date of birth, ID band Patient awake    Reviewed: Allergy & Precautions, NPO status , Patient's Chart, lab work & pertinent test results  History of Anesthesia Complications Negative for: history of anesthetic complications  Airway Mallampati: III  TM Distance: >3 FB Neck ROM: full  Mouth opening: Limited Mouth Opening  Dental  (+) Chipped, Dental Advidsory Given   Pulmonary neg pulmonary ROS, neg sleep apnea, neg COPD, Patient abstained from smoking.Not current smoker, former smoker   Pulmonary exam normal breath sounds clear to auscultation       Cardiovascular Exercise Tolerance: Good hypertension, Pt. on medications + CAD, + Past MI, + CABG and +CHF  + dysrhythmias Atrial Fibrillation  Rhythm:Regular Rate:Normal - Systolic murmurs TRANSTHORACIC ECHOCARDIOGRAM performed on 03/24/2018 1. Left ventricle: The cavity size was normal. There was mild concentric hypertrophy. Systolic function was normal. Theestimated ejection fraction was in the range of 55% to 60%. Wall motion was normal; there were no regional wall motion  abnormalities. Left ventricular diastolic function parameters were normal.  2. Mitral valve: There was mild regurgitation. Valve area by pressure half-time: 1.98 cm^2.  3. Left atrium: The atrium was moderately dilated.  4. Right ventricle: The cavity size was normal. Wall thickness was normal. Systolic function was normal.  5. Pulmonary arteries: Systolic pressure was mildly increased. PA peak pressure: 37 mm Hg (S).  6. Pericardium, extracardiac: A mild pericardial effusion was identified lateral to the left ventricle. Features were not consistent with tamponade physiology.  CORONARY ARTERY BYPASS GRAFTING PROCEDURE performed on 03/22/2018 1. 3 vessel CABG procedure  LIMA-LAD  SVG-OM  SVG-RI   Neuro/Psych  PSYCHIATRIC DISORDERS  Depression    negative  neurological ROS     GI/Hepatic Neg liver ROS,GERD  Controlled,,  Endo/Other  negative endocrine ROSneg diabetes    Renal/GU Renal disease     Musculoskeletal   Abdominal   Peds  Hematology negative hematology ROS (+)   Anesthesia Other Findings Note from Quentin Mulling:  Hassani Sliney is a 77 y.o. male who is submitted for pre-surgical anesthesia review and clearance prior to him undergoing the above procedure. Patient is a Former Smoker (25 pack years; quit 06/1987). Pertinent PMH includes: CAD (s/p CABG), NSTEMI, atrial fibrillation/flutter (s/p ablation), IRBBB, cardiomyopathy, mild cardiomegaly, SVT, aortic atherosclerosis, HTN, HLD, CKD-III, RIGHT solitary pulmonary nodule, GERD (uses CaCO3 tablets PRN), hiatal hernia, papillary bladder tumor, LEFT renal mass (Bx proven RCC; s/p LEFT nephrectomy), BPH, nephrolithiasis, cervical spinal stenosis (C6-C7), lumbar DDD, depression, insomnia.    Patient is followed by cardiology [name], MD). He was last seen in the cardiology clinic on 12/16/2022; notes reviewed. At the time of his clinic visit, patient doing well overall from a cardiovascular perspective. Patient complained of being "weak and tired". He was experiencing episodes of mild exertional dyspnea. Patient denied any chest pain, PND, orthopnea, palpitations, significant peripheral edema, vertiginous symptoms, or presyncope/syncope. Patient with a past medical history significant for cardiovascular diagnoses. Documented physical exam was grossly benign, providing no evidence of acute exacerbation and/or decompensation of the patient's known cardiovascular conditions.    PMH significant for atrial flutter. He underwent an ablation procedure while living in [place] in approximately 2009. Since ablation, patient denies recurrence.     Patient suffered an NSTEMI on 03/14/2018. He underwent a diagnostic left heart catheterization on 03/15/2018 that revealed an ef of 50% with inferior  hypokinesis. There was multivessel CAD noted; 80% pLAD, 99% OM1, and 100% pRCA. PCI was  attempted, however failed. Initially there was TIMI-1 flow, however during interventional attempt, flow degraded to TIMI-0. Patient was transferred to [facility] for consultation with CVTS regarding high risk PCI versus surgical revascularization.      Patient ultimately underwent a 3 vessel CABG on 03/22/2018. LIMA-LAD, SVG-OM, and SVG-RI bypass grafts were placed.     Last TTE was performed on 11/12/2022 revealing a mildly left ventricular systolic function with an EF of 45%. There was global hypokinesis. Diastolic function parameters normal. There was moderate biatrial and mild right ventricular enlargement. Moderate mitral and tricuspid, in addition to trivial aortic and pulmonic, valve regurgitation observed. All transvalvular gradients were noted to be normal providing no evidence suggestive of valvular stenosis. RVSP 33.4 mmHg. Aorta normal in size with no evidence of aneurysmal dilatation.     Myocardial perfusion imaging study performed on 11/12/2022 demonstrated an mildly left ventricular systolic function with an LVEF of 44%. Inferolateral basal walls. Stress imaging revealed moderate inferolateral basal wall defect with no significant areas of redistribution or reversible ischemia. Baseline ECG revealed atrial fibrillation. Study determined to be intermediate risk.     Cardiac MRI performed on 11/18/2022 revealing reduced left ventricular systolic function with an LVEF of 41%. There was global hypokinesis, in addition to akinesis of the inferolateral wall. Near transmural LGE in the basal inferior and inferolateral left ventricular myocardium comprising about 10 grams (`8%) of the total myocardial mass. Finding consistent with prior myocardial infarction in the basal inferior and inferolateral left ventricular myocardium.    Patient with an atrial fibrillation diagnosis; CHA2DS2-VASc Score = 5 (age x 2,  HTN, prior MI, vascular disease history). Cardiac rate and rhythm had been being maintained intrinsically without pharmacological intervention. Following recent event monitor study and cardiac MRI, the decision was made to start patient on antiarrythmic therapy using amiodarone. He was also started on oral DOAC using apixaban. Blood pressure well controlled at 122/76 mmHg on currently prescribed ARB (losartan) monotherapy. He is on atorvastatin + omega-3 fatty acid capsule daily for his HLD diagnosis and further ASCVD prevention. Patient is not diabetic. Patient does not have an OSAH diagnosis. Patient is able to complete all of his  ADL/IADLs without cardiovascular limitation.  Per the [facility], patient is able to achieve at least 4 METS of physical activity without experiencing any significant degree of angina/anginal equivalent symptoms. No other changes were made to his medication regimen during his visit with cardiology.  Patient scheduled to follow-up with outpatient cardiology in 2 months or sooner if needed.   Kaysin Brock is scheduled for an HOLEP-LASER ENUCLEATION OF THE PROSTATE WITH MORCELLATION on 01/18/2023 with Dr. [name], MD.  Given patient's past medical history significant for cardiovascular diagnoses, presurgical cardiac clearance was sought by the PAT team. Per cardiology, "this patient is optimized for surgery and may proceed with the planned procedural course with a ACCEPTABLE risk of significant perioperative cardiovascular complications".     Again, this patient is on daily DOAC therapy. He has been instructed on recommendations for holding his apixaban for 2 days prior to his procedure with plans to restart as soon as postoperative bleeding risk felt to be minimized by his attending surgeon. The patient has been instructed that his last dose of his apixaban should be on 01/15/2023.   Patient denies previous perioperative complications with anesthesia in the past. In review of the  available records, it is noted that patient underwent a general anesthetic course here at [facility] (ASA III)  in 08/2021 without documented complications.      Past Medical History: No date: Aortic atherosclerosis (HCC) 07/03/2021: Bladder tumor     Comment:  a.) cystoscopy revealed 1.5 cm relatively flat papillary              tumor lateral to UO (LEFT); felt to be consistent with               RCC No date: BPH (benign prostatic hyperplasia) 03/15/2018: CAD (coronary artery disease)     Comment:  a.) NSTEMI 03/14/2018 --> LHC --> EF 50% with inf HK;               80% pLAD, 99% OM1, 100% pRCA --> failed PCI (TIMI-1               reduced to TIMI-0); transferred to [facility] and               underwent 3v CABG (LIMA-LAD, SVG-OM, SVG-RI) on               03/22/2018; b.) cMRI 11/18/2022: EF 41%, glob HK, basal               and inferolateral wall AK. Near transmural LGE in basal               inferior and inferolat LV myocardium (10 g; ~8%               myocardial mass) c/w prior infarct 03/15/2018: Cardiomyopathy (HCC)     Comment:  a.) LHC 03/15/2018: EF 50%; b.) TTE 03/17/2018: EF               55-60%; c.) TEE 03/22/2018: 50-55%; d.) TTE 11/12/2022:               EF 45%; e.) cMRI 11/18/2022: EF 41% No date: Cervical spinal stenosis     Comment:  a.) C6-C7 No date: CKD (chronic kidney disease), stage III (HCC) No date: Colon polyps No date: DDD (degenerative disc disease), lumbar No date: Depression No date: Elevated PSA No date: Enlarged prostate No date: GERD (gastroesophageal reflux disease) No date: Hiatal hernia No date: Hyperlipidemia No date: Hypertension No date: Incomplete right bundle branch block (RBBB) No date: Insomnia     Comment:  a.) takes trazodone PRN No date: Long term current use of amiodarone No date: Long term current use of anticoagulant     Comment:  a.) apixaban No date: Mild cardiomegaly No date:  Nephrolithiasis 03/14/2018: NSTEMI (non-ST elevated myocardial infarction) (HCC)     Comment:  a.) LHC 03/15/2018 --> 80% pLAD, 99% OM1, 100% pRCA -->               failed PCI and Tx'd to [facility] for CVTS consult;               underwent 3v CABG (LIMA-LAD, SVG-OM, SVG-RI) on               03/25/2018. No date: Persistent atrial fibrillation (HCC)     Comment:  a.) CHA2DS2-VASc = 5 (age x2, HTN, prior MI, vascular               disease history). b.) rate/rhythm maintained on oral               amiodarone; chronically anticoagulated using apixaban 05/27/2021: Renal cell carcinoma, left (HCC)     Comment:  a.) CT urogram --> 4.1 x 3.6 x 4.7 cm mass at LEFT  kidney pole. b.) CNB (+) for RCC, conventional clear cell              type, nuclear grade II. 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 d.) PET CT               07/31/2021 revealed FDG avid LEFT renal mass (SUV 4.12);               e.) s/p LEFT nephrectomy 09/08/2021 -> pathology (+) for               stage 1 RCC 05/27/2021: Right lower lobe pulmonary nodule     Comment:  a.) CT chest --> measured 9 mm. 03/22/2018: S/P CABG x 3     Comment:  a.) 3v CABG: LIMA-LAD, SVG-OM, SVG-RI No date: Sleep apnea 2009: SVT (supraventricular tachycardia)     Comment:  a.) s/p SVT ablation while living in [place] No date: Umbilical hernia  Past Surgical History: No date: COLONOSCOPY 03/22/2018: CORONARY ARTERY BYPASS GRAFT; N/A     Comment:  Procedure: CORONARY ARTERY BYPASS GRAFTING (CABG) x 3               WITH ENDOSCOPIC HARVESTING OF RIGHT GREATER SAPHENOUS               VEIN;  Surgeon: Kerin Perna, MD;  Location: [facility] OR;                Service: Open Heart Surgery;  Laterality: N/A; 03/16/2018: CORONARY STENT INTERVENTION; N/A     Comment:  Procedure: CORONARY STENT INTERVENTION;  Surgeon:                Alwyn Pea, MD;  Location: [facility] INVASIVE CV LAB;               Service: Cardiovascular;  Laterality: N/A; 09/08/2021: LAPAROSCOPIC NEPHRECTOMY, HAND ASSISTED; Left     Comment:  Procedure: HAND ASSISTED LAPAROSCOPIC NEPHRECTOMY;                Surgeon: [name], MD;  Location: [facility] ORS;                Service: Urology;  Laterality: Left; 03/22/2018: LEFT ATRIAL APPENDAGE OCCLUSION; N/A     Comment:  Procedure: LEFT ATRIAL APPENDAGE OCCLUSION USING 40 MM               ATRICURE ATRICLIP;  Surgeon: Kerin Perna, MD;                Location: [facility] OR;  Service: Open Heart Surgery;                Laterality: N/A; 03/15/2018: LEFT HEART CATH AND CORONARY ANGIOGRAPHY; N/A     Comment:  Procedure: LEFT HEART CATH AND CORONARY ANGIOGRAPHY and               PCI stent;  Surgeon: Alwyn Pea, MD;  Location:               [facility] INVASIVE CV LAB;  Service: Cardiovascular;                Laterality: N/A; 2009: SVT ABLATION; N/A     Comment:  Procedure: SVT ABLATION; Location: performed while  living in [place]. 03/22/2018: TEE WITHOUT CARDIOVERSION; N/A     Comment:  Procedure: TRANSESOPHAGEAL ECHOCARDIOGRAM (TEE);                Surgeon: Donata Clay, Theron Arista, MD;  Location: [facility] OR;                Service: Open Heart Surgery;  Laterality: N/A; 07/28/2021: TRANSURETHRAL RESECTION OF BLADDER TUMOR; N/A     Comment:  Procedure: TRANSURETHRAL RESECTION OF BLADDER TUMOR               (TURBT)WITH  GEMCITABINE;  Surgeon: [name], MD;               Location: [facility] ORS;  Service: Urology;  Laterality: N/A;  BMI    Body Mass Index: 29.02 kg/m      Reproductive/Obstetrics negative OB ROS                             Anesthesia Physical Anesthesia Plan  ASA: 3  Anesthesia Plan: General   Post-op Pain Management:    Induction: Intravenous  PONV Risk Score and Plan: 3 and Ondansetron, Dexamethasone and Treatment may vary due to age or  medical condition  Airway Management Planned: Natural Airway and Nasal Cannula  Additional Equipment: None  Intra-op Plan:   Post-operative Plan: Extubation in OR  Informed Consent: I have reviewed the patients History and Physical, chart, labs and discussed the procedure including the risks, benefits and alternatives for the proposed anesthesia with the patient or authorized representative who has indicated his/her understanding and acceptance.     Dental Advisory Given  Plan Discussed with: Anesthesiologist, CRNA and Surgeon  Anesthesia Plan Comments: (Patient consented for risks of anesthesia including but not limited to:  - adverse reactions to medications - risk of airway placement if required - damage to eyes, teeth, lips or other oral mucosa - nerve damage due to positioning  - sore throat or hoarseness - Damage to heart, brain, nerves, lungs, other parts of body or loss of life  Patient voiced understanding and assent.)       Anesthesia Quick Evaluation

## 2023-05-04 NOTE — Transfer of Care (Signed)
Immediate Anesthesia Transfer of Care Note  Patient: David Mccullough  Procedure(s) Performed: CARDIOVERSION  Patient Location: PACU  Anesthesia Type:General  Level of Consciousness: awake, alert , and oriented  Airway & Oxygen Therapy: Patient Spontanous Breathing  Post-op Assessment: Report given to RN and Post -op Vital signs reviewed and stable  Post vital signs: Reviewed and stable  Last Vitals:  Vitals Value Taken Time  BP 129/76 05/04/23 0748  Temp    Pulse 52 05/04/23 0749  Resp 20 05/04/23 0749  SpO2 91 % 05/04/23 0749  Vitals shown include unfiled device data.  Last Pain:  Vitals:   05/04/23 0748  TempSrc:   PainSc: 0-No pain         Complications: No notable events documented.

## 2023-05-05 NOTE — Anesthesia Postprocedure Evaluation (Signed)
Anesthesia Post Note  Patient: David Mccullough  Procedure(s) Performed: CARDIOVERSION  Patient location during evaluation: PACU Anesthesia Type: General Level of consciousness: awake and alert Pain management: pain level controlled Vital Signs Assessment: post-procedure vital signs reviewed and stable Respiratory status: spontaneous breathing, nonlabored ventilation, respiratory function stable and patient connected to nasal cannula oxygen Cardiovascular status: blood pressure returned to baseline and stable Postop Assessment: no apparent nausea or vomiting Anesthetic complications: no  No notable events documented.   Last Vitals:  Vitals:   05/04/23 0815 05/04/23 0830  BP: (!) 117/59 (!) 122/59  Pulse: (!) 42 (!) 45  Resp: 11 13  Temp:    SpO2: 100% 97%    Last Pain:  Vitals:   05/04/23 0830  TempSrc:   PainSc: 0-No pain                 Stephanie Coup

## 2023-05-06 ENCOUNTER — Other Ambulatory Visit: Payer: Self-pay

## 2023-05-06 DIAGNOSIS — R3129 Other microscopic hematuria: Secondary | ICD-10-CM

## 2023-05-07 ENCOUNTER — Other Ambulatory Visit: Payer: Medicare Other | Admitting: Urology

## 2023-05-07 ENCOUNTER — Ambulatory Visit (INDEPENDENT_AMBULATORY_CARE_PROVIDER_SITE_OTHER): Payer: Medicare Other | Admitting: Urology

## 2023-05-07 ENCOUNTER — Other Ambulatory Visit
Admission: RE | Admit: 2023-05-07 | Discharge: 2023-05-07 | Disposition: A | Discharge status: Home / Self Care | Payer: Medicare Other | Attending: Urology | Admitting: Urology

## 2023-05-07 VITALS — BP 147/82 | HR 68 | Ht 72.0 in | Wt 220.0 lb

## 2023-05-07 DIAGNOSIS — R3129 Other microscopic hematuria: Secondary | ICD-10-CM | POA: Diagnosis not present

## 2023-05-07 DIAGNOSIS — C679 Malignant neoplasm of bladder, unspecified: Secondary | ICD-10-CM

## 2023-05-07 DIAGNOSIS — Z8551 Personal history of malignant neoplasm of bladder: Secondary | ICD-10-CM

## 2023-05-07 DIAGNOSIS — C642 Malignant neoplasm of left kidney, except renal pelvis: Secondary | ICD-10-CM

## 2023-05-07 DIAGNOSIS — N401 Enlarged prostate with lower urinary tract symptoms: Secondary | ICD-10-CM | POA: Insufficient documentation

## 2023-05-07 DIAGNOSIS — Z85528 Personal history of other malignant neoplasm of kidney: Secondary | ICD-10-CM | POA: Diagnosis not present

## 2023-05-07 DIAGNOSIS — N3941 Urge incontinence: Secondary | ICD-10-CM

## 2023-05-07 DIAGNOSIS — N138 Other obstructive and reflux uropathy: Secondary | ICD-10-CM | POA: Diagnosis not present

## 2023-05-07 LAB — URINALYSIS, COMPLETE (UACMP) WITH MICROSCOPIC
Bilirubin Urine: NEGATIVE
Glucose, UA: NEGATIVE mg/dL
Hgb urine dipstick: NEGATIVE
Ketones, ur: NEGATIVE mg/dL
Leukocytes,Ua: NEGATIVE
Nitrite: NEGATIVE
Protein, ur: NEGATIVE mg/dL
RBC / HPF: NONE SEEN RBC/hpf (ref 0–5)
Specific Gravity, Urine: 1.015 (ref 1.005–1.030)
Squamous Epithelial / HPF: NONE SEEN /[HPF] (ref 0–5)
pH: 5.5 (ref 5.0–8.0)

## 2023-05-07 LAB — PSA: Prostatic Specific Antigen: 2.17 ng/mL (ref 0.00–4.00)

## 2023-05-07 MED ORDER — TROSPIUM CHLORIDE 20 MG PO TABS: 20.0000 mg | ORAL_TABLET | Freq: Two times a day (BID) | ORAL | 11 refills | Status: DC

## 2023-05-07 NOTE — Progress Notes (Signed)
   05/07/23 CC:  Chief Complaint  Patient presents with   Cysto     HPI: David Mccullough is a 77 y.o. male  with a personal history of renal cell carcinoma of left kidney, malignant neoplasm of urinary bladder,urinary urgency, incomplete bladder emptying, elevated PSA, microscopic hematuria, and pulmonary nodule. He presents today for a cystostopy.   Renal biopsy on 06/26/2021 surgical pathology revealed renal cell carcinoma, conventional clear-cell type, nuclear grade 2.   He is s/p TURBT with instillation of intravesical chemotherapy on 07/28/2021.Intraoperative findings: Flat broad-based superficial appearing 1.5 cm papillary left posterior bladder wall tumor consistent with transitional cell carcinoma.  No other lesions in the bladder identified.  Mildly trabeculated bladder.   Surgical pathology of bladder specimen consistent with  non-invasive papillary urothelial carcinoma, low grade.   He is s/p hand-assisted laparoscopic left radical nephrectomy on 09/08/2021.    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 [id number].  He is being followed with serial imaging with Dr. Orlie Dakin.  CT from 2/14 ago was negative for any evidence of recurrence.  Nonspecific pulmonary nodule also being followed- stable.  S/p HoLEP.  Improved stream but still has urgency/ frequency and urge incontinence.    NED. A&Ox3.   No respiratory distress   Abd soft, NT, ND Normal phallus with bilateral descended testicles  Cystoscopy Procedure Note  Patient identification was confirmed, informed consent was obtained, and patient was prepped using Betadine solution.  Lidocaine jelly was administered per urethral meatus.     Pre-Procedure: - Inspection reveals a normal caliber ureteral meatus.  Procedure: The flexible cystoscope was introduced without difficulty - No urethral strictures/lesions are present. - HoLEP defect with sparing at apex - Normal bladder neck -  Bilateral ureteral orifices identified - Bladder mucosa  reveals no ulcers, tumors, or lesions - No bladder stones - Mile trabeculation with wide mouth diverticulum at dome - Previous TURBT site appreciated, on the left lateral bladder wall.   Retroflexion shows HoLEP defect   Post-Procedure: - Patient tolerated the procedure well   Assessment/ Plan:  Bladder cancer TCC  -No obvious disease today  -cysto in 6 mo  2. Renal cell carcinoma  - Will undergo surveillance imaging with Dr Orlie Dakin at 6 month interval, scheduled 3/25   3. Urge incontinence -Failed gemtesa.  Will try sanctura 20 mg bid, will let us know if effective or not  4. BPH with atypical prostate tissue -s/p HoLEP -PSA today for new base line    Follow-up cystoscopy in 6  months   [name], MD

## 2023-05-13 DIAGNOSIS — G4733 Obstructive sleep apnea (adult) (pediatric): Secondary | ICD-10-CM | POA: Diagnosis not present

## 2023-05-13 DIAGNOSIS — E782 Mixed hyperlipidemia: Secondary | ICD-10-CM | POA: Diagnosis not present

## 2023-05-13 DIAGNOSIS — R0602 Shortness of breath: Secondary | ICD-10-CM | POA: Diagnosis not present

## 2023-05-13 DIAGNOSIS — I4891 Unspecified atrial fibrillation: Secondary | ICD-10-CM | POA: Diagnosis not present

## 2023-05-13 DIAGNOSIS — I2510 Atherosclerotic heart disease of native coronary artery without angina pectoris: Secondary | ICD-10-CM | POA: Diagnosis not present

## 2023-05-13 DIAGNOSIS — R55 Syncope and collapse: Secondary | ICD-10-CM | POA: Diagnosis not present

## 2023-05-13 DIAGNOSIS — I214 Non-ST elevation (NSTEMI) myocardial infarction: Secondary | ICD-10-CM | POA: Diagnosis not present

## 2023-05-13 DIAGNOSIS — Z9889 Other specified postprocedural states: Secondary | ICD-10-CM | POA: Diagnosis not present

## 2023-05-13 DIAGNOSIS — Z951 Presence of aortocoronary bypass graft: Secondary | ICD-10-CM | POA: Diagnosis not present

## 2023-05-13 DIAGNOSIS — I10 Essential (primary) hypertension: Secondary | ICD-10-CM | POA: Diagnosis not present

## 2023-05-13 DIAGNOSIS — N183 Chronic kidney disease, stage 3 unspecified: Secondary | ICD-10-CM | POA: Diagnosis not present

## 2023-05-13 DIAGNOSIS — I429 Cardiomyopathy, unspecified: Secondary | ICD-10-CM | POA: Diagnosis not present

## 2023-05-16 NOTE — CV Procedure (Signed)
Electrical Cardioversion Procedure Note   Procedure: Electrical Cardioversion Indications:  Atrial Fibrillation  Procedure Details Consent: Risks of procedure as well as the alternatives and risks of each were explained to the (patient/caregiver).  Consent for procedure obtained. Time Out: Verified patient identification, verified procedure, site/side was marked, verified correct patient position, special equipment/implants available, medications/allergies/relevent history reviewed, required imaging and test results available.  Performed  Patient placed on cardiac monitor, pulse oximetry, supplemental oxygen as necessary.  Sedation given:  Propropol  as per anesth. Pacer pads placed anterior and posterior chest.  Cardioverted 1 time(s).  Cardioverted at 120J.  Evaluation Findings: Post procedure EKG shows: NSR Complications: None Patient did tolerate procedure well.   David Mccullough 05/04/23 0745

## 2023-05-20 DIAGNOSIS — N2581 Secondary hyperparathyroidism of renal origin: Secondary | ICD-10-CM | POA: Diagnosis not present

## 2023-05-20 DIAGNOSIS — I10 Essential (primary) hypertension: Secondary | ICD-10-CM | POA: Diagnosis not present

## 2023-05-20 DIAGNOSIS — N1832 Chronic kidney disease, stage 3b: Secondary | ICD-10-CM | POA: Diagnosis not present

## 2023-06-01 DIAGNOSIS — Z951 Presence of aortocoronary bypass graft: Secondary | ICD-10-CM | POA: Diagnosis not present

## 2023-06-01 DIAGNOSIS — I5022 Chronic systolic (congestive) heart failure: Secondary | ICD-10-CM | POA: Insufficient documentation

## 2023-07-06 NOTE — Progress Notes (Signed)
 Established Patient Visit   Chief Complaint: Chief Complaint  Patient presents with  . Follow-up    3 months   Date of Service: 07/07/2023 Date of Birth: 1946/04/02 PCP: Joshua Cathryne Rodney, MD  History of Present Illness: David Mccullough is a 78 y.o.male patient who presents for a 3 month follow up. PMH significant for atrial fibrillation, coronary artery disease, sleep apnea, cardiomyopathy.  Today, pt says that he is doing well. Denies any recent palpitations, swelling in feet, or SOB. EKG was administered today at wife's request. No changes were made.   Visit Summaries: (05/13/23) Patient was seen by me for a follow up and presented with hyperlipidemia. Reduced Lipitor  to 40 mg a day for treatment of hyperlipidemia. Follow up with nephrology about Chronic renal sufficiency stage III. Follow up with heart failure clinic.    Past Medical and Surgical History  Past Medical History Past Medical History:  Diagnosis Date  . Atrial fibrillation (CMS/HHS-HCC) 10/17/22  . Coronary artery disease   . History of cancer    Blader  cancer  . Sleep apnea 10/20/22    Past Surgical History He has a past surgical history that includes Coronary artery bypass graft (4/19).   Medications and Allergies  Current Medications  Current Outpatient Medications  Medication Sig Dispense Refill  . AMIOdarone  (PACERONE ) 100 MG tablet Take 1 tablet (100 mg total) by mouth once daily 90 tablet 3  . apixaban (ELIQUIS) 5 mg tablet Take 1 tablet (5 mg total) by mouth every 12 (twelve) hours 60 tablet 11  . atorvastatin  (LIPITOR ) 40 MG tablet Take 1 tablet (40 mg total) by mouth once daily 90 tablet 3  . losartan (COZAAR) 25 MG tablet TAKE 1 TABLET DAILY 90 tablet 3  . multivitamin tablet Take 1 tablet by mouth once daily    . omega-3 fatty acids-fish oil 360-1,200 mg Cap Take by mouth    . traZODone  (DESYREL ) 50 MG tablet Take by mouth    . trospium  (SANCTURA ) 20 mg tablet Take 20 mg by mouth every other day     . mirabegron  (MYRBETRIQ ) 50 mg ER tablet Take by mouth (Patient not taking: Reported on 07/07/2023)     No current facility-administered medications for this visit.    Allergies: Patient has no known allergies.  Social and Family History  Social History  reports that he quit smoking about 45 years ago. His smoking use included cigarettes. He started smoking about 65 years ago. He has a 20 pack-year smoking history. He has never used smokeless tobacco. He reports current alcohol use of about 1.0 standard drink of alcohol per week. He reports that he does not use drugs.  Family History No family history on file.  Review of Systems   Pertinent positives and negatives are mentioned above in HPI and all other systems are negative.  Physical Examination   Vitals:BP (!) 150/76 (BP Location: Left upper arm, Patient Position: Sitting, BP Cuff Size: Large Adult)   Pulse 65   Resp 16   Ht 182.9 cm (6')   Wt 100.7 kg (222 lb)   SpO2 97%   BMI 30.11 kg/m  Ht:182.9 cm (6') Wt:100.7 kg (222 lb) ADJ:Anib surface area is 2.26 meters squared. Body mass index is 30.11 kg/m.  HEENT: Pupils equally reactive to light and accomodation  Neck: Supple without thyromegaly, carotid pulses 2+ Lungs: clear to auscultation bilaterally; no wheezes, rales, rhonchi Heart: Regular rate and rhythm.  No gallops, murmurs or rub Abdomen: soft nontender, nondistended,  with normal bowel sounds Extremities: no cyanosis, clubbing, or edema Peripheral Pulses: 2+ in all extremities, 2+ femoral pulses bilaterally Neurologic: Alert and oriented X3; speech intact; face symmetrical; moves all extremities well  Cardiovascular Studies:    Echocardiogram 2D complete: 11/12/2022 INTERPRETATION MILD LV SYSTOLIC DYSFUNCTION WITH AN ESTIMATED EF = 40-45 % NORMAL RIGHT VENTRICULAR SYSTOLIC FUNCTION MODERATE TRICUSPID AND MITRAL VALVE INSUFFICIENCY TRACE AORTIC VALVE INSUFFICIENCY NO VALVULAR STENOSIS MILD LV  ENLARGEMENT MILD RV ENLARGEMENT MODERATE BIATRIAL ENLARGEMENT  NM Myocardial Perfusion SPECT multiple (stress and rest): 11/12/2022 FINDINGS: Regional wall motion:  demonstrates  hypokinesis of the inf/lateral basal walls . The overall quality of the study is good.   Artifacts noted: no Left ventricular cavity: normal.   Perfusion Analysis:  SPECT images demonstrate moderate perfusion abnormality of moderate intensity is present in the inferior lateral basal region on the stress images. Defect type:  Mixed   IMPRESSION: Mildly abnormal marker perfusion scan history of inferior lateral basal persistent defect no significant areas of redistribution or reversible ischemia ejection fraction was mildly diminished at 44% with inferior lateral basal hypokinesis.  This is an intermediate scan but no clear evidence of reversible ischemia recommend medical therapy unless symptoms persist or worsen  Cardiac Catheterization:   Holter:  Cardiac CT Scan: 10/20/2022 Scan on file  Cardiac MRI: 10/20/2022 Scan on file  Assessment   78 y.o. male with history of atrial fibrillation status post ablation and recent cardioversion  Atrial fibrillation paroxysmal  Hyperlipidemia  Hypertension  Multivessel coronary disease Chronic renal sufficiency stage III  Heart failure  History of smoking Status post recent cardioversion Status post recent ablation for A-fib  Plan   Atrial fibrillation paroxysmal status post cardioversion continue amiodarone  Eliquis patient EKG today still suggest sinus rhythm.  Continue amiodarone  Eliquis for now Hyperlipidemia continue Lipitor  therapy for lipid management reduce Lipitor  to 40 mg a day lipid and liver studies as per primary patient also on omega-3 fatty acids Hypertension reasonably controlled continue losartan therapy Multivessel coronary disease coronary artery surgery x 3 stable continue statin losartan stable no recent anginal symptoms Chronic renal  sufficiency stage III outpatient follow-up with nephrology Have the patient follow-up heart failure clinic EF around 40 to 45% Status post cardioversion for atrial fibrillation back in sinus rhythm now feels reasonably well From a history of smoking advised patient to continue to refrain from tobacco abuse  Follow up in 6 months  This note is partially written by David Mccullough, in the presence of and acting as the scribe of Dr. Cara Mccullough.      David Mccullough  I have reviewed, edited and added to the note to reflect my best personal medical judgment.  Attestation Statement:   I personally performed the service. (TP)  David JONETTA LOVELACE, MD  [facility] [place], [place] Ph:  [phone] Fax:  [phone] This note was generated in part with voice recognition software, Dragon.  I apologize for any typographical errors that were not detected and corrected from this process.  They are unintentional.

## 2023-07-07 DIAGNOSIS — I10 Essential (primary) hypertension: Secondary | ICD-10-CM | POA: Diagnosis not present

## 2023-07-07 DIAGNOSIS — Z8679 Personal history of other diseases of the circulatory system: Secondary | ICD-10-CM | POA: Diagnosis not present

## 2023-07-07 DIAGNOSIS — Z9889 Other specified postprocedural states: Secondary | ICD-10-CM | POA: Diagnosis not present

## 2023-07-07 DIAGNOSIS — Z951 Presence of aortocoronary bypass graft: Secondary | ICD-10-CM | POA: Diagnosis not present

## 2023-07-07 DIAGNOSIS — I214 Non-ST elevation (NSTEMI) myocardial infarction: Secondary | ICD-10-CM | POA: Diagnosis not present

## 2023-07-07 DIAGNOSIS — I429 Cardiomyopathy, unspecified: Secondary | ICD-10-CM | POA: Diagnosis not present

## 2023-07-07 DIAGNOSIS — I5022 Chronic systolic (congestive) heart failure: Secondary | ICD-10-CM | POA: Diagnosis not present

## 2023-07-07 DIAGNOSIS — I2510 Atherosclerotic heart disease of native coronary artery without angina pectoris: Secondary | ICD-10-CM | POA: Diagnosis not present

## 2023-07-07 DIAGNOSIS — N183 Chronic kidney disease, stage 3 unspecified: Secondary | ICD-10-CM | POA: Diagnosis not present

## 2023-07-07 DIAGNOSIS — E782 Mixed hyperlipidemia: Secondary | ICD-10-CM | POA: Diagnosis not present

## 2023-07-07 DIAGNOSIS — G4733 Obstructive sleep apnea (adult) (pediatric): Secondary | ICD-10-CM | POA: Diagnosis not present

## 2023-07-07 DIAGNOSIS — I4891 Unspecified atrial fibrillation: Secondary | ICD-10-CM | POA: Diagnosis not present

## 2023-07-22 ENCOUNTER — Ambulatory Visit: Payer: Medicare Other | Admitting: Family Medicine

## 2023-08-12 DIAGNOSIS — I272 Pulmonary hypertension, unspecified: Secondary | ICD-10-CM | POA: Diagnosis not present

## 2023-08-12 DIAGNOSIS — R0609 Other forms of dyspnea: Secondary | ICD-10-CM | POA: Diagnosis not present

## 2023-08-12 DIAGNOSIS — J439 Emphysema, unspecified: Secondary | ICD-10-CM | POA: Diagnosis not present

## 2023-08-12 DIAGNOSIS — G4733 Obstructive sleep apnea (adult) (pediatric): Secondary | ICD-10-CM | POA: Diagnosis not present

## 2023-08-20 ENCOUNTER — Other Ambulatory Visit
Admission: RE | Admit: 2023-08-20 | Discharge: 2023-08-20 | Disposition: A | Discharge status: Home / Self Care | Payer: Medicare Other | Attending: Family Medicine | Admitting: Family Medicine

## 2023-08-20 ENCOUNTER — Ambulatory Visit: Payer: Medicare Other | Admitting: Family Medicine

## 2023-08-20 ENCOUNTER — Telehealth: Payer: Self-pay

## 2023-08-20 ENCOUNTER — Encounter: Payer: Self-pay | Admitting: Family Medicine

## 2023-08-20 VITALS — BP 132/68 | HR 65 | Ht 72.0 in | Wt 225.6 lb

## 2023-08-20 DIAGNOSIS — E785 Hyperlipidemia, unspecified: Secondary | ICD-10-CM | POA: Insufficient documentation

## 2023-08-20 DIAGNOSIS — G4700 Insomnia, unspecified: Secondary | ICD-10-CM | POA: Diagnosis not present

## 2023-08-20 LAB — COMPREHENSIVE METABOLIC PANEL
ALT: 18 U/L (ref 0–44)
AST: 25 U/L (ref 15–41)
Albumin: 4 g/dL (ref 3.5–5.0)
Alkaline Phosphatase: 39 U/L (ref 38–126)
Anion gap: 8 (ref 5–15)
BUN: 25 mg/dL — ABNORMAL HIGH (ref 8–23)
CO2: 24 mmol/L (ref 22–32)
Calcium: 9.3 mg/dL (ref 8.9–10.3)
Chloride: 101 mmol/L (ref 98–111)
Creatinine, Ser: 1.85 mg/dL — ABNORMAL HIGH (ref 0.61–1.24)
GFR, Estimated: 37 mL/min — ABNORMAL LOW (ref >60)
Glucose, Bld: 98 mg/dL (ref 70–99)
Potassium: 4.1 mmol/L (ref 3.5–5.1)
Sodium: 133 mmol/L — ABNORMAL LOW (ref 135–145)
Total Bilirubin: 0.8 mg/dL (ref 0.0–1.2)
Total Protein: 6.9 g/dL (ref 6.5–8.1)

## 2023-08-20 LAB — LIPID PANEL
Cholesterol: 181 mg/dL (ref 0–200)
HDL: 41 mg/dL (ref >40)
LDL Cholesterol: 102 mg/dL — ABNORMAL HIGH (ref 0–99)
Total CHOL/HDL Ratio: 4.4 {ratio}
Triglycerides: 190 mg/dL — ABNORMAL HIGH (ref <150)
VLDL: 38 mg/dL (ref 0–40)

## 2023-08-20 MED ORDER — TRAZODONE HCL 50 MG PO TABS: ORAL_TABLET | ORAL | 1 refills | Status: DC

## 2023-08-20 MED ORDER — ATORVASTATIN CALCIUM 80 MG PO TABS: ORAL_TABLET | ORAL | 1 refills | Status: DC

## 2023-08-20 NOTE — Telephone Encounter (Signed)
Patient was seen for visit this AM.      Copied from CRM [id number]. Topic: Clinical - Request for Lab/Test Order >> Aug 19, 2023 12:00 PM Elle L wrote: Reason for CRM: The patient states he had a urinalysis done at his Urologist and there was bacteria in his urine. He is requesting to see if he could have a urinalysis done before his appointment on 2/21. I did confirm that he is not having symptoms at this time. The patient's call back number is [phone].

## 2023-08-20 NOTE — Progress Notes (Signed)
Date:  08/20/2023   Name:  David Mccullough   DOB:  [birth year]   MRN:  [id number]   Chief Complaint: Dyslipidemia (Patient presents today for medication refill on his dyslipidemia. He has been taking his medication as directed and does not have any concerns for today's visit.)  Hyperlipidemia This is a chronic problem. The current episode started more than 1 year ago. The problem is controlled. Recent lipid tests were reviewed and are normal. Exacerbating diseases include obesity. He has no history of chronic renal disease, diabetes, hypothyroidism, liver disease or nephrotic syndrome. There are no known factors aggravating his hyperlipidemia. Pertinent negatives include no chest pain, focal sensory loss, focal weakness, leg pain, myalgias or shortness of breath. Current antihyperlipidemic treatment includes statins. The current treatment provides moderate improvement of lipids. There are no compliance problems.  Risk factors for coronary artery disease include dyslipidemia.  Insomnia Primary symptoms: no fragmented sleep, no sleep disturbance, no difficulty falling asleep, no somnolence, no frequent awakening, no premature morning awakening, no malaise/fatigue, no napping.   The current episode started more than one month. The onset quality is undetermined. The problem occurs intermittently. The problem has been gradually improving since onset. Past treatments include medication (trazadone).    Lab Results  Component Value Date   NA 135 10/17/2022   K 4.2 10/17/2022   CO2 24 10/17/2022   GLUCOSE 108 (H) 10/17/2022   BUN 30 (H) 10/17/2022   CREATININE 1.77 (H) 10/17/2022   CALCIUM 9.1 10/17/2022   EGFR 40 (L) 10/14/2021   GFRNONAA 39 (L) 10/17/2022   Lab Results  Component Value Date   CHOL 127 08/18/2022   HDL 34 (L) 08/18/2022   LDLCALC 76 08/18/2022   TRIG 87 08/18/2022   CHOLHDL 3.7 08/18/2022   Lab Results  Component Value Date   TSH 2.360 01/17/2019   Lab Results   Component Value Date   HGBA1C 5.2 03/17/2018   Lab Results  Component Value Date   WBC 10.1 10/17/2022   HGB 13.1 10/17/2022   HCT 38.5 (L) 10/17/2022   MCV 83.3 10/17/2022   PLT 185 10/17/2022   Lab Results  Component Value Date   ALT 16 08/18/2022   AST 19 08/18/2022   ALKPHOS 56 08/18/2022   BILITOT 0.7 08/18/2022   No results found for: "25OHVITD2", "25OHVITD3", "VD25OH"   Review of Systems  Constitutional:  Negative for chills, fever, malaise/fatigue and unexpected weight change.  HENT:  Negative for congestion.   Eyes:  Negative for visual disturbance.  Respiratory:  Negative for chest tightness, shortness of breath and wheezing.   Cardiovascular:  Negative for chest pain, palpitations and leg swelling.  Gastrointestinal:  Negative for abdominal distention.  Genitourinary:  Negative for difficulty urinating.  Musculoskeletal:  Negative for myalgias.  Neurological:  Negative for focal weakness.  Psychiatric/Behavioral:  Negative for sleep disturbance. The patient has insomnia.     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   NSTEMI (non-ST elevated myocardial infarction) (HCC) 03/14/2018    Not on File  Past Surgical History:  Procedure Laterality Date   CARDIOVERSION N/A 05/04/2023   Procedure: CARDIOVERSION;  Surgeon: Alwyn Pea, MD;  Location: [facility] ORS;  Service: Cardiovascular;  Laterality: N/A;   COLONOSCOPY     CORONARY ARTERY BYPASS GRAFT N/A 03/22/2018  Procedure: CORONARY ARTERY BYPASS GRAFTING (CABG) x 3 WITH ENDOSCOPIC HARVESTING OF RIGHT GREATER SAPHENOUS VEIN;  Surgeon: Kerin Perna, MD;  Location: [facility] OR;  Service: Open Heart Surgery;  Laterality: N/A;   CORONARY STENT INTERVENTION N/A 03/16/2018   Procedure: CORONARY STENT INTERVENTION;  Surgeon: Alwyn Pea,  MD;  Location: [facility] INVASIVE [facility];  Service: Cardiovascular;  Laterality: N/A;   HOLEP-LASER ENUCLEATION OF THE PROSTATE WITH MORCELLATION N/A 01/18/2023   Procedure: HOLEP-LASER ENUCLEATION OF THE PROSTATE WITH MORCELLATION;  Surgeon: [name], MD;  Location: [facility] ORS;  Service: Urology;  Laterality: N/A;   LAPAROSCOPIC NEPHRECTOMY, HAND ASSISTED Left 09/08/2021   Procedure: HAND ASSISTED LAPAROSCOPIC NEPHRECTOMY;  Surgeon: [name], MD;  Location: [facility] ORS;  Service: Urology;  Laterality: Left;   LEFT ATRIAL APPENDAGE OCCLUSION N/A 03/22/2018   Procedure: LEFT ATRIAL APPENDAGE OCCLUSION USING 40 MM ATRICURE ATRICLIP;  Surgeon: Kerin Perna, MD;  Location: [facility];  Service: Open Heart Surgery;  Laterality: N/A;   LEFT HEART CATH AND CORONARY ANGIOGRAPHY N/A 03/15/2018   Procedure: LEFT HEART CATH AND CORONARY ANGIOGRAPHY and PCI stent;  Surgeon: Alwyn Pea, MD;  Location: [facility];  Service: Cardiovascular;  Laterality: N/A;   SVT ABLATION N/A 2009   Procedure: SVT ABLATION; Location: performed while living in [place].   TEE WITHOUT CARDIOVERSION N/A 03/22/2018   Procedure: TRANSESOPHAGEAL ECHOCARDIOGRAM (TEE);  Surgeon: Donata Clay, Theron Arista, MD;  Location: [facility] OR;  Service: Open Heart Surgery;  Laterality: N/A;   TRANSURETHRAL RESECTION OF BLADDER TUMOR N/A 07/28/2021   Procedure: TRANSURETHRAL RESECTION OF BLADDER TUMOR (TURBT)WITH  GEMCITABINE;  Surgeon: [name], MD;  Location: [facility] ORS;  Service: Urology;  Laterality: N/A;    Social History   Tobacco Use   Smoking status: Former    Current packs/day: 0.00    Average packs/day: 1 pack/day for 25.0 years (25.0 ttl pk-yrs)    Types: Cigarettes    Start date: 06/29/1962    Quit date: 06/30/1987    Years since quitting: 36.1    Passive exposure: Never   Smokeless tobacco: Never  Vaping Use   Vaping status: Never Used  Substance Use Topics   Alcohol use: Not Currently   Drug use: Not Currently      Medication list has been reviewed and updated.  Current Meds  Medication Sig   amiodarone (PACERONE) 200 MG tablet Take 200 mg by mouth daily.   apixaban (ELIQUIS) 5 MG TABS tablet Take 5 mg by mouth 2 (two) times daily.   atorvastatin (LIPITOR) 80 MG tablet TAKE 1 TABLET(80 MG) BY MOUTH DAILY AT 6 PM   losartan (COZAAR) 25 MG tablet Take 25 mg by mouth daily.   Multiple Vitamins-Minerals (MULTIVITAMIN WITH MINERALS) tablet Take 1 tablet by mouth daily.   Omega-3 Fatty Acids (FISH OIL PO) Take 1 capsule by mouth daily.   traZODone (DESYREL) 50 MG tablet Take 1 tablet by mouth daily (Patient taking differently: Take 25 mg by mouth at bedtime. Take 1 tablet by mouth daily)       08/20/2023   10:12 AM 03/04/2023    1:13 PM 10/19/2022    9:07 AM 08/18/2022    9:21 AM  GAD 7 : Generalized Anxiety Score  Nervous, Anxious, on Edge 0 0 0 0  Control/stop worrying 0 0 0 0  Worry too much - different things 0 0 0 0  Trouble relaxing 0 0 0 0  Restless 0 0 0 0  Easily annoyed  or irritable 0 0 0 0  Afraid - awful might happen 0 0 0 0  Total GAD 7 Score 0 0 0 0  Anxiety Difficulty Not difficult at all Not difficult at all Not difficult at all Not difficult at all       08/20/2023   10:11 AM 03/04/2023    1:12 PM 10/21/2022   10:31 AM  Depression screen PHQ 2/9  Decreased Interest 0 0 0  Down, Depressed, Hopeless 0 0 0  PHQ - 2 Score 0 0 0  Altered sleeping 0 0 0  Tired, decreased energy 0 0 0  Change in appetite 1 0 0  Feeling bad or failure about yourself  0 0 0  Trouble concentrating 0 0 0  Moving slowly or fidgety/restless 0 0 0  Suicidal thoughts 0 0 0  PHQ-9 Score 1 0 0  Difficult doing work/chores Not difficult at all Not difficult at all Not difficult at all    BP Readings from Last 3 Encounters:  08/20/23 132/68  05/07/23 (!) 147/82  05/04/23 (!) 122/59    Physical Exam Vitals and nursing note reviewed.  Constitutional:      Appearance: He is well-developed.   HENT:     Head: Normocephalic and atraumatic.     Right Ear: Tympanic membrane, ear canal and external ear normal.     Left Ear: Tympanic membrane, ear canal and external ear normal.     Nose: Nose normal. No congestion or rhinorrhea.     Mouth/Throat:     Mouth: Mucous membranes are moist.     Dentition: Normal dentition.  Eyes:     General: Lids are normal. No scleral icterus.    Conjunctiva/sclera: Conjunctivae normal.     Pupils: Pupils are equal, round, and reactive to light.  Neck:     Thyroid: No thyromegaly.     Vascular: No carotid bruit, hepatojugular reflux or JVD.     Trachea: No tracheal deviation.  Cardiovascular:     Rate and Rhythm: Normal rate and regular rhythm.     Heart sounds: Normal heart sounds. No murmur heard.    No friction rub. No gallop.  Pulmonary:     Effort: Pulmonary effort is normal.     Breath sounds: Normal breath sounds. No wheezing, rhonchi or rales.  Abdominal:     General: Bowel sounds are normal.     Palpations: Abdomen is soft. There is no hepatomegaly, splenomegaly or mass.     Tenderness: There is no abdominal tenderness.     Hernia: There is no hernia in the left inguinal area.  Musculoskeletal:        General: Normal range of motion.     Cervical back: Normal range of motion and neck supple.  Lymphadenopathy:     Cervical: No cervical adenopathy.  Skin:    General: Skin is warm and dry.     Findings: No rash.  Neurological:     Mental Status: He is alert and oriented to person, place, and time.     Sensory: No sensory deficit.     Deep Tendon Reflexes: Reflexes are normal and symmetric.  Psychiatric:        Mood and Affect: Mood is not anxious or depressed.     Wt Readings from Last 3 Encounters:  08/20/23 225 lb 9.6 oz (102.3 kg)  05/07/23 220 lb (99.8 kg)  05/04/23 218 lb (98.9 kg)    BP 132/68   Pulse 65   Ht  6' (1.829 m)   Wt 225 lb 9.6 oz (102.3 kg)   SpO2 98%   BMI 30.60 kg/m   Assessment and Plan:  1.  Dyslipidemia (Primary) Chronic.  Controlled.  Stable.  Asymptomatic.  Patient is experiencing no myalgias or muscle weakness.  Tolerating atorvastatin 80 mg well.  Will continue with current dosing.  Will check CMP for hepatic concerns and lipid panel for current level of control of LDL.  Will recheck patient in 6 months. - atorvastatin (LIPITOR) 80 MG tablet; TAKE 1 TABLET(80 MG) BY MOUTH DAILY AT 6 PM  Dispense: 90 tablet; Refill: 1 - Comprehensive metabolic panel - Lipid Panel With LDL/HDL Ratio  2. Insomnia, unspecified type Chronic.  Controlled.  Stable.  Patient has excellent results with trazodone 50 mg he takes one half to initiate and if he wakes up early he will take the other half.  Continue trazodone 50 mg 1/2 tablet twice a night as needed.  Will recheck patient in 6 months. - traZODone (DESYREL) 50 MG tablet; Take 1 tablet by mouth daily  Dispense: 90 tablet; Refill: 1    Elizabeth Sauer, MD

## 2023-08-21 ENCOUNTER — Encounter: Payer: Self-pay | Admitting: Family Medicine

## 2023-08-30 ENCOUNTER — Inpatient Hospital Stay: Payer: Medicare Other | Attending: Oncology

## 2023-08-30 ENCOUNTER — Ambulatory Visit
Admission: RE | Admit: 2023-08-30 | Discharge: 2023-08-30 | Disposition: A | Discharge status: Home / Self Care | Payer: Medicare Other | Source: Ambulatory Visit | Attending: Oncology | Admitting: Oncology

## 2023-08-30 DIAGNOSIS — Z85528 Personal history of other malignant neoplasm of kidney: Secondary | ICD-10-CM | POA: Diagnosis not present

## 2023-08-30 DIAGNOSIS — I1310 Hypertensive heart and chronic kidney disease without heart failure, with stage 1 through stage 4 chronic kidney disease, or unspecified chronic kidney disease: Secondary | ICD-10-CM | POA: Insufficient documentation

## 2023-08-30 DIAGNOSIS — C649 Malignant neoplasm of unspecified kidney, except renal pelvis: Secondary | ICD-10-CM | POA: Diagnosis not present

## 2023-08-30 DIAGNOSIS — Z87891 Personal history of nicotine dependence: Secondary | ICD-10-CM | POA: Insufficient documentation

## 2023-08-30 DIAGNOSIS — Z79899 Other long term (current) drug therapy: Secondary | ICD-10-CM | POA: Diagnosis not present

## 2023-08-30 DIAGNOSIS — I700 Atherosclerosis of aorta: Secondary | ICD-10-CM | POA: Diagnosis not present

## 2023-08-30 DIAGNOSIS — N183 Chronic kidney disease, stage 3 unspecified: Secondary | ICD-10-CM | POA: Insufficient documentation

## 2023-08-30 DIAGNOSIS — C642 Malignant neoplasm of left kidney, except renal pelvis: Secondary | ICD-10-CM | POA: Diagnosis not present

## 2023-08-30 DIAGNOSIS — Z905 Acquired absence of kidney: Secondary | ICD-10-CM | POA: Insufficient documentation

## 2023-08-30 DIAGNOSIS — K8020 Calculus of gallbladder without cholecystitis without obstruction: Secondary | ICD-10-CM | POA: Diagnosis not present

## 2023-08-30 LAB — CBC WITH DIFFERENTIAL/PLATELET
Abs Immature Granulocytes: 0.04 10*3/uL (ref 0.00–0.07)
Basophils Absolute: 0 10*3/uL (ref 0.0–0.1)
Basophils Relative: 1 %
Eosinophils Absolute: 0.2 10*3/uL (ref 0.0–0.5)
Eosinophils Relative: 3 %
HCT: 38.4 % — ABNORMAL LOW (ref 39.0–52.0)
Hemoglobin: 13.2 g/dL (ref 13.0–17.0)
Immature Granulocytes: 1 %
Lymphocytes Relative: 24 %
Lymphs Abs: 1.5 10*3/uL (ref 0.7–4.0)
MCH: 30.6 pg (ref 26.0–34.0)
MCHC: 34.4 g/dL (ref 30.0–36.0)
MCV: 88.9 fL (ref 80.0–100.0)
Monocytes Absolute: 0.3 10*3/uL (ref 0.1–1.0)
Monocytes Relative: 5 %
Neutro Abs: 4.2 10*3/uL (ref 1.7–7.7)
Neutrophils Relative %: 66 %
Platelets: 153 10*3/uL (ref 150–400)
RBC: 4.32 MIL/uL (ref 4.22–5.81)
RDW: 13 % (ref 11.5–15.5)
WBC: 6.3 10*3/uL (ref 4.0–10.5)
nRBC: 0 % (ref 0.0–0.2)

## 2023-08-30 LAB — CMP (CANCER CENTER ONLY)
ALT: 19 U/L (ref 0–44)
AST: 25 U/L (ref 15–41)
Albumin: 4.1 g/dL (ref 3.5–5.0)
Alkaline Phosphatase: 40 U/L (ref 38–126)
Anion gap: 7 (ref 5–15)
BUN: 22 mg/dL (ref 8–23)
CO2: 27 mmol/L (ref 22–32)
Calcium: 9.3 mg/dL (ref 8.9–10.3)
Chloride: 104 mmol/L (ref 98–111)
Creatinine: 2.04 mg/dL — ABNORMAL HIGH (ref 0.61–1.24)
GFR, Estimated: 33 mL/min — ABNORMAL LOW (ref >60)
Glucose, Bld: 100 mg/dL — ABNORMAL HIGH (ref 70–99)
Potassium: 4.2 mmol/L (ref 3.5–5.1)
Sodium: 138 mmol/L (ref 135–145)
Total Bilirubin: 0.7 mg/dL (ref 0.0–1.2)
Total Protein: 6.8 g/dL (ref 6.5–8.1)

## 2023-08-30 MED ORDER — IOHEXOL 300 MG/ML  SOLN
75.0000 mL | Freq: Once | INTRAMUSCULAR | Status: AC | PRN
  Administered 2023-08-30: 75 mL via INTRAVENOUS

## 2023-09-01 ENCOUNTER — Other Ambulatory Visit: Payer: Medicare Other

## 2023-09-07 ENCOUNTER — Inpatient Hospital Stay (HOSPITAL_BASED_OUTPATIENT_CLINIC_OR_DEPARTMENT_OTHER): Payer: Medicare Other | Admitting: Oncology

## 2023-09-07 ENCOUNTER — Encounter: Payer: Self-pay | Admitting: Oncology

## 2023-09-07 VITALS — BP 140/80 | HR 66 | Temp 98.1°F | Resp 16 | Ht 72.0 in | Wt 222.0 lb

## 2023-09-07 DIAGNOSIS — Z79899 Other long term (current) drug therapy: Secondary | ICD-10-CM | POA: Diagnosis not present

## 2023-09-07 DIAGNOSIS — C642 Malignant neoplasm of left kidney, except renal pelvis: Secondary | ICD-10-CM

## 2023-09-07 DIAGNOSIS — I1310 Hypertensive heart and chronic kidney disease without heart failure, with stage 1 through stage 4 chronic kidney disease, or unspecified chronic kidney disease: Secondary | ICD-10-CM | POA: Diagnosis not present

## 2023-09-07 DIAGNOSIS — Z87891 Personal history of nicotine dependence: Secondary | ICD-10-CM | POA: Diagnosis not present

## 2023-09-07 DIAGNOSIS — Z905 Acquired absence of kidney: Secondary | ICD-10-CM | POA: Diagnosis not present

## 2023-09-07 DIAGNOSIS — Z85528 Personal history of other malignant neoplasm of kidney: Secondary | ICD-10-CM | POA: Diagnosis not present

## 2023-09-07 DIAGNOSIS — N183 Chronic kidney disease, stage 3 unspecified: Secondary | ICD-10-CM | POA: Diagnosis not present

## 2023-09-07 NOTE — Progress Notes (Unsigned)
 [facility]  [phone] [phone]  ID: David Mccullough OB: 10-13-1945  MR#: [id number]  AOZ#:[id number]  Patient Care Team: Duanne Limerick, MD as PCP - General (Family Medicine) [name], MD as Consulting Physician (Urology) Alwyn Pea, MD as Consulting Physician (Cardiology) Jeralyn Ruths, MD as Consulting Physician (Oncology)  CHIEF COMPLAINT: Stage I left renal cell carcinoma.  INTERVAL HISTORY: Patient returns to clinic today for yearly evaluation and discussion of his imaging results.  He continues to feel well and remains asymptomatic.  He has no neurologic complaints.  He denies any recent fevers or illnesses.  He has a good appetite and denies weight loss.  He has no chest pain, shortness of breath, cough, or hemoptysis.  He denies any nausea, vomiting, constipation, or diarrhea.  He has no urinary complaints.  Patient offers no specific complaints today.  REVIEW OF SYSTEMS:   Review of Systems  Constitutional: Negative.  Negative for fever, malaise/fatigue and weight loss.  Respiratory: Negative.  Negative for cough, hemoptysis and shortness of breath.   Cardiovascular: Negative.  Negative for chest pain and leg swelling.  Gastrointestinal: Negative.  Negative for abdominal pain.  Genitourinary: Negative.  Negative for dysuria.  Musculoskeletal: Negative.  Negative for back pain.  Skin: Negative.  Negative for rash.  Neurological: Negative.  Negative for dizziness, focal weakness, weakness and headaches.  Psychiatric/Behavioral: Negative.  The patient is not nervous/anxious.     As per HPI. Otherwise, a complete review of systems is negative.  PAST MEDICAL HISTORY: Past Medical History:  Diagnosis Date   Aortic atherosclerosis (HCC)    Bladder tumor 07/03/2021   a.) cystoscopy revealed 1.5 cm relatively flat papillary tumor lateral to UO (LEFT); felt to be consistent with RCC   BPH (benign prostatic  hyperplasia)    CAD (coronary artery disease) 03/15/2018   a.) NSTEMI 03/14/2018 --> LHC --> EF 50% with inf HK; 80% pLAD, 99% OM1, 100% pRCA --> failed PCI (TIMI-1 reduced to TIMI-0); transferred to [facility] and underwent 3v CABG (LIMA-LAD, SVG-OM, SVG-RI) on 03/22/2018; b.) cMRI 11/18/2022: EF 41%, glob HK, basal and inferolateral wall AK. Near transmural LGE in basal inferior and inferolat LV myocardium (10 g; ~8% myocardial mass) c/w prior infarct   Cardiomyopathy (HCC) 03/15/2018   a.) LHC 03/15/2018: EF 50%; b.) TTE 03/17/2018: EF 55-60%; c.) TEE 03/22/2018: 50-55%; d.) TTE 11/12/2022: EF 45%; e.) cMRI 11/18/2022: EF 41%   Cervical spinal stenosis    a.) C6-C7   CKD (chronic kidney disease), stage III (HCC)    Colon polyps    DDD (degenerative disc disease), lumbar    Depression    Elevated PSA    Enlarged prostate    GERD (gastroesophageal reflux disease)    Hiatal hernia    Hyperlipidemia    Hypertension    Incomplete right bundle branch block (RBBB)    Insomnia    a.) takes trazodone PRN   Long term current use of amiodarone    Long term current use of anticoagulant    a.) apixaban   Mild cardiomegaly    Nephrolithiasis    NSTEMI (non-ST elevated myocardial infarction) (HCC) 03/14/2018   a.) LHC 03/15/2018 --> 80% pLAD, 99% OM1, 100% pRCA --> failed PCI and Tx'd to [facility] for CVTS consult; underwent 3v CABG (LIMA-LAD, SVG-OM, SVG-RI) on 03/25/2018.   Persistent atrial fibrillation (HCC)    a.) CHA2DS2-VASc = 5 (age x2, HTN, prior MI, vascular disease history). b.) rate/rhythm maintained on oral  amiodarone; chronically anticoagulated using apixaban   Renal cell carcinoma, left (HCC) 05/27/2021   a.) CT urogram --> 4.1 x 3.6 x 4.7 cm mass at LEFT kidney pole. b.) CNB (+) for RCC, conventional clear cell type, nuclear grade II. 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 d.) PET CT  07/31/2021 revealed FDG avid LEFT renal mass (SUV 4.12); e.) s/p LEFT nephrectomy 09/08/2021 -> pathology (+) for stage 1 RCC   Right lower lobe pulmonary nodule 05/27/2021   a.) CT chest --> measured 9 mm.   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   a.) 3v CABG: LIMA-LAD, SVG-OM, SVG-RI   Sleep apnea    SVT (supraventricular tachycardia) (HCC) 2009   a.) s/p SVT ablation while living in [place]   Umbilical hernia     PAST SURGICAL HISTORY: Past Surgical History:  Procedure Laterality Date   CARDIOVERSION N/A 05/04/2023   Procedure: CARDIOVERSION;  Surgeon: Alwyn Pea, MD;  Location: [facility] ORS;  Service: Cardiovascular;  Laterality: N/A;   COLONOSCOPY     CORONARY ARTERY BYPASS GRAFT N/A 03/22/2018   Procedure: CORONARY ARTERY BYPASS GRAFTING (CABG) x 3 WITH ENDOSCOPIC HARVESTING OF RIGHT GREATER SAPHENOUS VEIN;  Surgeon: Kerin Perna, MD;  Location: [facility] OR;  Service: Open Heart Surgery;  Laterality: N/A;   CORONARY STENT INTERVENTION N/A 03/16/2018   Procedure: CORONARY STENT INTERVENTION;  Surgeon: Alwyn Pea, MD;  Location: [facility] INVASIVE CV LAB;  Service: Cardiovascular;  Laterality: N/A;   HOLEP-LASER ENUCLEATION OF THE PROSTATE WITH MORCELLATION N/A 01/18/2023   Procedure: HOLEP-LASER ENUCLEATION OF THE PROSTATE WITH MORCELLATION;  Surgeon: [name], MD;  Location: [facility] ORS;  Service: Urology;  Laterality: N/A;   LAPAROSCOPIC NEPHRECTOMY, HAND ASSISTED Left 09/08/2021   Procedure: HAND ASSISTED LAPAROSCOPIC NEPHRECTOMY;  Surgeon: [name], MD;  Location: [facility] ORS;  Service: Urology;  Laterality: Left;   LEFT ATRIAL APPENDAGE OCCLUSION N/A 03/22/2018   Procedure: LEFT ATRIAL APPENDAGE OCCLUSION USING 40 MM ATRICURE ATRICLIP;  Surgeon: Kerin Perna, MD;  Location: [facility] OR;  Service: Open Heart Surgery;  Laterality: N/A;   LEFT HEART CATH AND CORONARY ANGIOGRAPHY N/A 03/15/2018   Procedure: LEFT HEART CATH AND CORONARY ANGIOGRAPHY and PCI stent;  Surgeon: Alwyn Pea, MD;  Location: [facility] INVASIVE CV LAB;  Service: Cardiovascular;  Laterality: N/A;   SVT ABLATION N/A 2009   Procedure: SVT ABLATION; Location: performed while living in [place].   TEE WITHOUT CARDIOVERSION N/A 03/22/2018   Procedure: TRANSESOPHAGEAL ECHOCARDIOGRAM (TEE);  Surgeon: Donata Clay, Theron Arista, MD;  Location: [facility] OR;  Service: Open Heart Surgery;  Laterality: N/A;   TRANSURETHRAL RESECTION OF BLADDER TUMOR N/A 07/28/2021   Procedure: TRANSURETHRAL RESECTION OF BLADDER TUMOR (TURBT)WITH  GEMCITABINE;  Surgeon: [name], MD;  Location: [facility] ORS;  Service: Urology;  Laterality: N/A;    FAMILY HISTORY: Family History  Problem Relation Age of Onset   Heart disease Mother    Heart disease Father    Arrhythmia Sister    Heart disease Sister     ADVANCED DIRECTIVES (Y/N):  N  HEALTH MAINTENANCE: Social History   Tobacco Use   Smoking status: Former    Current packs/day: 0.00    Average packs/day: 1 pack/day for 25.0 years (25.0 ttl pk-yrs)    Types: Cigarettes    Start date: 06/29/1962    Quit date: 06/30/1987    Years since quitting: 36.2    Passive exposure: Never   Smokeless  tobacco: Never  Vaping Use   Vaping status: Never Used  Substance Use Topics   Alcohol use: Not Currently   Drug use: Not Currently     Colonoscopy:  PAP:  Bone density:  Lipid panel:  No Known Allergies  Current Outpatient Medications  Medication Sig Dispense Refill   amiodarone (PACERONE) 200 MG tablet Take 200 mg by mouth daily.     apixaban (ELIQUIS) 5 MG TABS tablet Take 5 mg by mouth 2 (two) times daily.     atorvastatin (LIPITOR) 80 MG tablet TAKE 1 TABLET(80 MG) BY MOUTH DAILY AT 6 PM 90 tablet 1   losartan (COZAAR) 25 MG tablet Take 25 mg by mouth daily.     Multiple Vitamins-Minerals (MULTIVITAMIN WITH MINERALS) tablet Take 1 tablet by mouth daily.     Omega-3 Fatty Acids (FISH OIL PO) Take 1 capsule by mouth daily.     traZODone (DESYREL) 50 MG tablet Take 1 tablet by  mouth daily 90 tablet 1   trospium (SANCTURA) 20 MG tablet Take 1 tablet (20 mg total) by mouth 2 (two) times daily. (Patient not taking: Reported on 09/07/2023) 30 tablet 11   No current facility-administered medications for this visit.    OBJECTIVE: Vitals:   09/07/23 1305  BP: (!) 140/80  Pulse: 66  Resp: 16  Temp: 98.1 F (36.7 C)  SpO2: 99%     Body mass index is 30.11 kg/m.    ECOG FS:0 - Asymptomatic  General: Well-developed, well-nourished, no acute distress. Eyes: Pink conjunctiva, anicteric sclera. HEENT: Normocephalic, moist mucous membranes. Lungs: No audible wheezing or coughing. Heart: Regular rate and rhythm. Abdomen: Soft, nontender, no obvious distention. Musculoskeletal: No edema, cyanosis, or clubbing. Neuro: Alert, answering all questions appropriately. Cranial nerves grossly intact. Skin: No rashes or petechiae noted. Psych: Normal affect.  LAB RESULTS:  Lab Results  Component Value Date   NA 138 08/30/2023   K 4.2 08/30/2023   CL 104 08/30/2023   CO2 27 08/30/2023   GLUCOSE 100 (H) 08/30/2023   BUN 22 08/30/2023   CREATININE 2.04 (H) 08/30/2023   CALCIUM 9.3 08/30/2023   PROT 6.8 08/30/2023   ALBUMIN 4.1 08/30/2023   AST 25 08/30/2023   ALT 19 08/30/2023   ALKPHOS 40 08/30/2023   BILITOT 0.7 08/30/2023   GFRNONAA 33 (L) 08/30/2023   GFRAA >60 04/25/2018    Lab Results  Component Value Date   WBC 6.3 08/30/2023   NEUTROABS 4.2 08/30/2023   HGB 13.2 08/30/2023   HCT 38.4 (L) 08/30/2023   MCV 88.9 08/30/2023   PLT 153 08/30/2023     STUDIES: CT CHEST ABDOMEN PELVIS W CONTRAST Result Date: 08/30/2023 CLINICAL DATA:  Follow-up renal cell carcinoma, pulmonary nodule. * Tracking Code: BO *. EXAM: CT CHEST, ABDOMEN, AND PELVIS WITH CONTRAST TECHNIQUE: Multidetector CT imaging of the chest, abdomen and pelvis was performed following the standard protocol during bolus administration of intravenous contrast. RADIATION DOSE REDUCTION: This exam  was performed according to the departmental dose-optimization program which includes automated exposure control, adjustment of the mA and/or kV according to patient size and/or use of iterative reconstruction technique. CONTRAST:  75mL OMNIPAQUE IOHEXOL 300 MG/ML  SOLN COMPARISON:  Multiple priors including CT August 26, 2022 FINDINGS: CT CHEST FINDINGS Cardiovascular: Aortic atherosclerosis. Prior median sternotomy and CABG. No central pulmonary embolus on this nondedicated study. Biatrial cardiac enlargement. No significant pericardial effusion or thickening. Mediastinum/Nodes: No suspicious thyroid nodule. No pathologically enlarged mediastinal, hilar or axillary lymph nodes. Gas  fluid level in the esophagus. Lungs/Pleura: Stable solid 9 mm right lower lobe pulmonary nodule on image 115/4. Stable 4 mm right lower lobe pulmonary nodule on image 131/4. No new suspicious pulmonary nodules or masses. Musculoskeletal: No aggressive lytic or blastic lesion of bone. Prior median sternotomy. CT ABDOMEN PELVIS FINDINGS Hepatobiliary: No suspicious hepatic lesion. Cholelithiasis. No biliary ductal dilation. Pancreas: No pancreatic ductal dilation or evidence of acute inflammation. Spleen: No splenomegaly. Adrenals/Urinary Tract: Bilateral adrenal glands appear normal. Left kidney surgically absent without suspicious enhancing nodularity in the surgical bed. Right kidney is unremarkable without hydronephrosis or solid renal mass. Urinary bladder is unremarkable for degree of distension. Stomach/Bowel: Stomach is unremarkable for degree of distension. No evidence of bowel obstruction. Colonic stool burden compatible with constipation. Vascular/Lymphatic: Aortic atherosclerosis. Smooth IVC contours. The portal, splenic and superior mesenteric veins are patent. No pathologically enlarged abdominal or pelvic lymph nodes. Reproductive: TURP defect in the prostate gland. Other: No significant abdominopelvic free fluid.  Musculoskeletal: Right gluteal intramuscular lipoma. No aggressive lytic or blastic lesion of bone. IMPRESSION: 1. Stable examination status post left nephrectomy without evidence of local recurrence or new/progressive disease in the chest abdomen or pelvis. 2. Stable solid 9 mm and 4 mm right lower lobe pulmonary nodules, favored benign. 3. Gas fluid level in the esophagus, correlate for gastroesophageal reflux. 4. Cholelithiasis. 5. Colonic stool burden compatible with constipation. 6.  Aortic Atherosclerosis (ICD10-I70.0). Electronically Signed   By: Maudry Mayhew M.D.   On: 08/30/2023 14:34    ASSESSMENT: Stage I left renal cell carcinoma.  PLAN:    Stage I left renal cell carcinoma: Patient underwent left nephrectomy on September 08, 2021 confirming stage of disease.  No adjuvant treatment was necessary.  His most recent imaging with CT scan on August 30, 2023 reviewed independently and reported as above with no obvious evidence of recurrent or progressive disease.    Per NCCN guidelines, will continue to monitor annually for approximately 5 years.  No further intervention is needed.  Return to clinic in 1 year with repeat imaging and further evaluation.   Right lower lobe pulmonary nodule: Benign.  No further imaging necessary. Renal insufficiency: Patient's status post nephrectomy.  His creatinine has trended up to 2.04.  Continue follow-up with nephrology.   Hypertension: Chronic and unchanged.  Continue monitoring and treatment per primary care.   Patient expressed understanding and was in agreement with this plan. He also understands that He can call clinic at any time with any questions, concerns, or complaints.    Cancer Staging  Renal cell adenocarcinoma, left [facility]) Staging form: Kidney, AJCC 8th Edition - Clinical stage from 08/19/2021: Stage I (cT1b, cN0, cM0) - Signed by Jeralyn Ruths, MD on 08/19/2021 Stage prefix: Initial diagnosis   Jeralyn Ruths, MD   09/07/2023 1:19  PM

## 2023-09-07 NOTE — Progress Notes (Unsigned)
 Would like recommendations for constipation.

## 2023-09-20 DIAGNOSIS — D631 Anemia in chronic kidney disease: Secondary | ICD-10-CM | POA: Diagnosis not present

## 2023-09-20 DIAGNOSIS — I10 Essential (primary) hypertension: Secondary | ICD-10-CM | POA: Diagnosis not present

## 2023-09-20 DIAGNOSIS — Z905 Acquired absence of kidney: Secondary | ICD-10-CM | POA: Diagnosis not present

## 2023-09-20 DIAGNOSIS — N2581 Secondary hyperparathyroidism of renal origin: Secondary | ICD-10-CM | POA: Diagnosis not present

## 2023-09-20 DIAGNOSIS — N1832 Chronic kidney disease, stage 3b: Secondary | ICD-10-CM | POA: Diagnosis not present

## 2023-10-20 ENCOUNTER — Encounter: Payer: Self-pay | Admitting: Emergency Medicine

## 2023-10-27 ENCOUNTER — Ambulatory Visit: Payer: Medicare Other | Admitting: Emergency Medicine

## 2023-10-27 VITALS — Ht 72.0 in | Wt 220.0 lb

## 2023-10-27 DIAGNOSIS — Z0000 Encounter for general adult medical examination without abnormal findings: Secondary | ICD-10-CM | POA: Diagnosis not present

## 2023-10-27 NOTE — Patient Instructions (Addendum)
 David Mccullough , Thank you for taking time to come for your Medicare Wellness Visit. I appreciate your ongoing commitment to your health goals. Please review the following plan we discussed and let me know if I can assist you in the future.   Referrals/Orders/Follow-Ups/Clinician Recommendations: Keep up the good work!  This is a list of the screening recommended for you and due dates:  Health Maintenance  Topic Date Due   Zoster (Shingles) Vaccine (1 of 2) Never done   COVID-19 Vaccine (4 - 2024-25 season) 02/28/2023   Flu Shot  01/28/2024   Medicare Annual Wellness Visit  10/26/2024   DTaP/Tdap/Td vaccine (3 - Td or Tdap) 10/16/2032   Pneumonia Vaccine  Completed   Hepatitis C Screening  Completed   HPV Vaccine  Aged Out   Meningitis B Vaccine  Aged Out   Colon Cancer Screening  Discontinued    Advanced directives: (Copy Requested) Please bring a copy of your health care power of attorney and living will to the office to be added to your chart at your convenience. You can mail to [facility] [address] [place], [place] [address] or email to [email or web address]  Next Medicare Annual Wellness Visit scheduled for next year: Yes, 11/01/24 @ 10:10am (phone visit)

## 2023-10-27 NOTE — Progress Notes (Signed)
 Subjective:   David Mccullough is a 78 y.o. who presents for a Medicare Wellness preventive visit.  Visit Complete: Virtual I connected with  Javier Meter on 10/27/23 by a audio enabled telemedicine application and verified that I am speaking with the correct person using two identifiers.  Patient Location: Home  Provider Location: Home Office  I discussed the limitations of evaluation and management by telemedicine. The patient expressed understanding and agreed to proceed.  Vital Signs: Because this visit was a virtual/telehealth visit, some criteria may be missing or patient reported. Any vitals not documented were not able to be obtained and vitals that have been documented are patient reported.  VideoDeclined- This patient declined Librarian, academic. Therefore the visit was completed with audio only.  Persons Participating in Visit: Patient.  AWV Questionnaire: No: Patient Medicare AWV questionnaire was not completed prior to this visit.  Cardiac Risk Factors include: advanced age (>64men, >94 women);male gender;Other (see comment), Risk factor comments: CAD, OSA (cpap)     Objective:    Today's Vitals   10/27/23 1006  Weight: 220 lb (99.8 kg)  Height: 6' (1.829 m)   Body mass index is 29.84 kg/m.     10/27/2023   10:20 AM 09/07/2023    1:08 PM 04/16/2023    8:22 AM 01/18/2023    8:39 AM 01/08/2023    4:08 PM 10/21/2022   10:32 AM 10/17/2022   11:20 AM  Advanced Directives  Does Patient Have a Medical Advance Directive? Yes Yes Yes No No No No  Type of Estate agent of [place];Living will Healthcare Power of [place];Living will Healthcare Power of [place];Living will      Does patient want to make changes to medical advance directive? No - Patient declined        Copy of Healthcare Power of Attorney in Chart? No - copy requested        Would patient like information on creating a medical advance directive?    No -  Patient declined  No - Patient declined No - Patient declined    Current Medications (verified) Outpatient Encounter Medications as of 10/27/2023  Medication Sig   amiodarone  (PACERONE ) 100 MG tablet Take 100 mg by mouth.   apixaban (ELIQUIS) 5 MG TABS tablet Take 5 mg by mouth 2 (two) times daily.   atorvastatin  (LIPITOR ) 40 MG tablet Take 40 mg by mouth daily.   losartan (COZAAR) 25 MG tablet Take 25 mg by mouth daily.   Multiple Vitamins-Minerals (MULTIVITAMIN WITH MINERALS) tablet Take 1 tablet by mouth daily.   Omega-3 Fatty Acids (FISH OIL PO) Take 1 capsule by mouth daily.   traZODone  (DESYREL ) 50 MG tablet Take 1 tablet by mouth daily   amiodarone  (PACERONE ) 200 MG tablet Take 200 mg by mouth daily. (Patient not taking: Reported on 10/27/2023)   atorvastatin  (LIPITOR ) 80 MG tablet TAKE 1 TABLET(80 MG) BY MOUTH DAILY AT 6 PM (Patient not taking: Reported on 10/27/2023)   trospium  (SANCTURA ) 20 MG tablet Take 1 tablet (20 mg total) by mouth 2 (two) times daily. (Patient not taking: Reported on 08/20/2023)   No facility-administered encounter medications on file as of 10/27/2023.    Allergies (verified) Patient has no known allergies.   History: Past Medical History:  Diagnosis Date   Aortic atherosclerosis (HCC)    Bladder tumor 07/03/2021   a.) cystoscopy revealed 1.5 cm relatively flat papillary tumor lateral to UO (LEFT); felt to be consistent with RCC  BPH (benign prostatic hyperplasia)    CAD (coronary artery disease) 03/15/2018   a.) NSTEMI 03/14/2018 --> LHC --> EF 50% with inf HK; 80% pLAD, 99% OM1, 100% pRCA --> failed PCI (TIMI-1 reduced to TIMI-0); transferred to [facility] and underwent 3v CABG (LIMA-LAD, SVG-OM, SVG-RI) on 03/22/2018; b.) cMRI 11/18/2022: EF 41%, glob HK, basal and inferolateral wall AK. Near transmural LGE in basal inferior and inferolat LV myocardium (10 g; ~8% myocardial mass) c/w prior infarct   Cardiomyopathy (HCC) 03/15/2018   a.) LHC 03/15/2018:  EF 50%; b.) TTE 03/17/2018: EF 55-60%; c.) TEE 03/22/2018: 50-55%; d.) TTE 11/12/2022: EF 45%; e.) cMRI 11/18/2022: EF 41%   Cervical spinal stenosis    a.) C6-C7   CKD (chronic kidney disease), stage III (HCC)    Colon polyps    DDD (degenerative disc disease), lumbar    Depression    Elevated PSA    Enlarged prostate    GERD (gastroesophageal reflux disease)    Hiatal hernia    Hyperlipidemia    Hypertension    Incomplete right bundle branch block (RBBB)    Insomnia    a.) takes trazodone  PRN   Long term current use of amiodarone     Long term current use of anticoagulant    a.) apixaban   Mild cardiomegaly    Nephrolithiasis    NSTEMI (non-ST elevated myocardial infarction) (HCC) 03/14/2018   a.) LHC 03/15/2018 --> 80% pLAD, 99% OM1, 100% pRCA --> failed PCI and Tx'd to [facility] for [facility]; underwent 3v CABG (LIMA-LAD, SVG-OM, SVG-RI) on 03/25/2018.   Persistent atrial fibrillation (HCC)    a.) CHA2DS2-VASc = 5 (age x2, HTN, prior MI, vascular disease history). 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 b.) CNB (+) for RCC, conventional clear cell type, nuclear grade II. 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 d.) PET CT 07/31/2021 revealed FDG avid LEFT renal mass (SUV 4.12); e.) s/p LEFT nephrectomy 09/08/2021 -> pathology (+) for stage 1 RCC   Right lower lobe pulmonary nodule 05/27/2021   a.) CT chest --> measured 9 mm.   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   a.) 3v CABG: LIMA-LAD, SVG-OM, SVG-RI   Sleep apnea    SVT (supraventricular tachycardia) (HCC) 2009   a.) s/p SVT ablation while living in [place]    Umbilical hernia    Past Surgical History:  Procedure Laterality Date   CARDIOVERSION N/A 05/04/2023   Procedure:  CARDIOVERSION;  Surgeon: Antonette Batters, MD;  Location: [facility] ORS;  Service: Cardiovascular;  Laterality: N/A;   COLONOSCOPY     CORONARY ARTERY BYPASS GRAFT N/A 03/22/2018   Procedure: CORONARY ARTERY BYPASS GRAFTING (CABG) x 3 WITH ENDOSCOPIC HARVESTING OF RIGHT GREATER SAPHENOUS VEIN;  Surgeon: Heriberto London, MD;  Location: [facility] OR;  Service: Open Heart Surgery;  Laterality: N/A;   CORONARY STENT INTERVENTION N/A 03/16/2018   Procedure: CORONARY STENT INTERVENTION;  Surgeon: Antonette Batters, MD;  Location: [facility] INVASIVE CV LAB;  Service: Cardiovascular;  Laterality: N/A;   HOLEP-LASER ENUCLEATION OF THE PROSTATE WITH MORCELLATION N/A 01/18/2023   Procedure: HOLEP-LASER ENUCLEATION OF THE PROSTATE WITH MORCELLATION;  Surgeon: Dustin Gimenez, MD;  Location: [facility] ORS;  Service: Urology;  Laterality: N/A;   LAPAROSCOPIC NEPHRECTOMY, HAND ASSISTED Left 09/08/2021  Procedure: HAND ASSISTED LAPAROSCOPIC NEPHRECTOMY;  Surgeon: Dustin Gimenez, MD;  Location: [facility] ORS;  Service: Urology;  Laterality: Left;   LEFT ATRIAL APPENDAGE OCCLUSION N/A 03/22/2018   Procedure: LEFT ATRIAL APPENDAGE OCCLUSION USING 40 MM ATRICURE ATRICLIP;  Surgeon: Heriberto London, MD;  Location: [facility] OR;  Service: Open Heart Surgery;  Laterality: N/A;   LEFT HEART CATH AND CORONARY ANGIOGRAPHY N/A 03/15/2018   Procedure: LEFT HEART CATH AND CORONARY ANGIOGRAPHY and PCI stent;  Surgeon: Antonette Batters, MD;  Location: [facility] INVASIVE CV LAB;  Service: Cardiovascular;  Laterality: N/A;   SVT ABLATION N/A 2009   Procedure: SVT ABLATION; Location: performed while living in [place] .   TEE WITHOUT CARDIOVERSION N/A 03/22/2018   Procedure: TRANSESOPHAGEAL ECHOCARDIOGRAM (TEE);  Surgeon: Matt Song, Donata Fryer, MD;  Location: [facility] OR;  Service: Open Heart Surgery;  Laterality: N/A;   TRANSURETHRAL RESECTION OF BLADDER TUMOR N/A 07/28/2021   Procedure: TRANSURETHRAL RESECTION OF BLADDER TUMOR (TURBT)WITH  GEMCITABINE ;  Surgeon: Dustin Gimenez, MD;  Location: [facility] ORS;  Service: Urology;  Laterality: N/A;   Family History  Problem Relation Age of Onset   Heart disease Mother    Heart disease Father    Arrhythmia Sister    Heart disease Sister    Social History   Socioeconomic History   Marital status: Married    Spouse name: Ivin Marrow   Number of children: 3   Years of education: Not on file   Highest education level: Not on file  Occupational History   Not on file  Tobacco Use   Smoking status: Former    Current packs/day: 0.00    Average packs/day: 1 pack/day for 25.0 years (25.0 ttl pk-yrs)    Types: Cigarettes    Start date: 06/29/1962    Quit date: 06/30/1987    Years since quitting: 36.3    Passive exposure: Never   Smokeless tobacco: Never  Vaping Use   Vaping status: Never Used  Substance and Sexual Activity   Alcohol use: Yes    Comment: 1 glass of wine once every 2 weeks   Drug use: Not Currently   Sexual activity: Not Currently  Other Topics Concern   Not on file  Social History Narrative   10/27/23 works part time for [employer] (2 days a week)   Social Drivers of Corporate investment banker Strain: Low Risk  (10/27/2023)   Overall Financial Resource Strain (CARDIA)    Difficulty of Paying Living Expenses: Not hard at all  Food Insecurity: No Food Insecurity (10/27/2023)   Hunger Vital Sign    Worried About Running Out of Food in the Last Year: Never true    Ran Out of Food in the Last Year: Never true  Transportation Needs: No Transportation Needs (10/27/2023)   PRAPARE - Administrator, Civil Service (Medical): No    Lack of Transportation (Non-Medical): No  Physical Activity: Insufficiently Active (10/27/2023)   Exercise Vital Sign    Days of Exercise per Week: 3 days    Minutes of Exercise per Session: 30 min  Stress: No Stress Concern Present (10/27/2023)   [employer] of Occupational Health - Occupational Stress Questionnaire    Feeling of Stress : Only a little   Social Connections: Socially Integrated (10/27/2023)   Social Connection and Isolation Panel [NHANES]    Frequency of Communication with Friends and Family: Three times a week    Frequency of Social Gatherings with Friends and Family: Three times a week  Attends Religious Services: More than 4 times per year    Active Member of Clubs or Organizations: Yes    Attends Banker Meetings: More than 4 times per year    Marital Status: Married    Tobacco Counseling Counseling given: Not Answered    Clinical Intake:  Pre-visit preparation completed: Yes  Pain : No/denies pain     BMI - recorded: 29.84 Nutritional Status: BMI 25 -29 Overweight Nutritional Risks: None Diabetes: No  Lab Results  Component Value Date   HGBA1C 5.2 03/17/2018     How often do you need to have someone help you when you read instructions, pamphlets, or other written materials from your doctor or pharmacy?: 1 - Never  Interpreter Needed?: No  Information entered by :: Jaunita Messier, CMA   Activities of Daily Living     10/27/2023   10:08 AM 05/04/2023    7:13 AM  In your present state of health, do you have any difficulty performing the following activities:  Hearing? 1 0  Comment has hearing aids, but doesn't wear   Vision? 0 0  Difficulty concentrating or making decisions? 0 0  Walking or climbing stairs? 1   Comment less stamina   Dressing or bathing? 0   Doing errands, shopping? 0   Preparing Food and eating ? N   Using the Toilet? N   In the past six months, have you accidently leaked urine? Y   Comment sometimes, sees urology   Do you have problems with loss of bowel control? N   Managing your Medications? N   Managing your Finances? N   Housekeeping or managing your Housekeeping? N     Patient Care Team: Clarise Crooks, MD as PCP - General (Family Medicine) Dustin Gimenez, MD as Consulting Physician (Urology) Antonette Batters, MD as Consulting Physician  (Cardiology) Shellie Dials, MD as Consulting Physician (Oncology) Rosa College, MD as Consulting Physician (Ophthalmology) Lateef, Munsoor, MD (Nephrology) Adelaida Holts, MD as Referring Physician (Pulmonary Disease)  Indicate any recent Medical Services you may have received from other than [facility] providers in the past year (date may be approximate).     Assessment:   This is a routine wellness examination for [place].  Hearing/Vision screen Hearing Screening - Comments:: Has hearing aids, but doesn't use Vision Screening - Comments:: Gets eye exams, Dr. Dusty Gin, [place]   Goals Addressed               This Visit's Progress     Increase physical activity (pt-stated)        Exercise more and eat healthier       Depression Screen     10/27/2023   10:17 AM 08/20/2023   10:11 AM 03/04/2023    1:12 PM 10/21/2022   10:31 AM 10/19/2022    9:07 AM 08/18/2022    9:20 AM 02/13/2022    2:38 PM  PHQ 2/9 Scores  PHQ - 2 Score 0 0 0 0 2 0 1  PHQ- 9 Score 0 1 0 0 3 0 2    Fall Risk     10/27/2023   10:21 AM 08/20/2023   10:11 AM 03/04/2023    1:12 PM 10/21/2022   10:33 AM 10/19/2022    9:08 AM  Fall Risk   Falls in the past year? 0 0 0 1 1  Number falls in past yr: 0 0 0 0 0  Injury with Fall? 0  0  0 1  Risk for fall due to : No Fall Risks No Fall Risks No Fall Risks History of fall(s) No Fall Risks  Follow up Falls prevention discussed;Falls evaluation completed Falls evaluation completed Falls evaluation completed Falls evaluation completed;Falls prevention discussed Falls evaluation completed    MEDICARE RISK AT HOME:  Medicare Risk at Home Any stairs in or around the home?: Yes If so, are there any without handrails?: Yes Home free of loose throw rugs in walkways, pet beds, electrical cords, etc?: Yes Adequate lighting in your home to reduce risk of falls?: Yes Life alert?: No Use of a cane, walker or w/c?: No Grab bars in the bathroom?: No Shower  chair or bench in shower?: No Elevated toilet seat or a handicapped toilet?: No  TIMED UP AND GO:  Was the test performed?  No  Cognitive Function: 6CIT completed        10/27/2023   10:22 AM 10/21/2022   10:36 AM  6CIT Screen  What Year? 0 points 0 points  What month? 0 points 0 points  What time? 0 points 3 points  Count back from 20 0 points 0 points  Months in reverse 0 points 0 points  Repeat phrase 0 points 0 points  Total Score 0 points 3 points    Immunizations Immunization History  Administered Date(s) Administered   Fluad Trivalent(High Dose 65+) 03/04/2023   Hepatitis A, Ped/Adol-2 Dose 12/17/1946   Hepatitis B, PED/ADOLESCENT 12-30-1945   Influenza, High Dose Seasonal PF 04/14/2018   Influenza-Unspecified 03/27/2019, 03/29/2021, 04/20/2022   MMR 11/05/1987   PFIZER(Purple Top)SARS-COV-2 Vaccination 08/11/2019, 09/06/2019, 03/29/2020   Pneumococcal Conjugate-13 02/05/2015   Pneumococcal Polysaccharide-23 01/29/2020   Td 06/16/2013   Tdap 10/17/2022   Varicella 11/05/1987    Screening Tests Health Maintenance  Topic Date Due   Zoster Vaccines- Shingrix (1 of 2) Never done   COVID-19 Vaccine (4 - 2024-25 season) 02/28/2023   INFLUENZA VACCINE  01/28/2024   Medicare Annual Wellness (AWV)  10/26/2024   DTaP/Tdap/Td (3 - Td or Tdap) 10/16/2032   Pneumonia Vaccine 68+ Years old  Completed   Hepatitis C Screening  Completed   HPV VACCINES  Aged Out   Meningococcal B Vaccine  Aged Out   Colonoscopy  Discontinued    Health Maintenance  Health Maintenance Due  Topic Date Due   Zoster Vaccines- Shingrix (1 of 2) Never done   COVID-19 Vaccine (4 - 2024-25 season) 02/28/2023   Health Maintenance Items Addressed: See Nurse Notes  Additional Screening:  Vision Screening: Recommended annual ophthalmology exams for early detection of glaucoma and other disorders of the eye.  Dental Screening: Recommended annual dental exams for proper oral  hygiene  Community Resource Referral / Chronic Care Management: CRR required this visit?  No   CCM required this visit?  No     Plan:     I have personally reviewed and noted the following in the patient's chart:   Medical and social history Use of alcohol, tobacco or illicit drugs  Current medications and supplements including opioid prescriptions. Patient is not currently taking opioid prescriptions. Functional ability and status Nutritional status Physical activity Advanced directives List of other physicians Hospitalizations, surgeries, and ER visits in previous 12 months Vitals Screenings to include cognitive, depression, and falls Referrals and appointments  In addition, I have reviewed and discussed with patient certain preventive protocols, quality metrics, and best practice recommendations. A written personalized care plan for preventive services as well as general preventive health  recommendations were provided to patient.     Jaunita Messier, CMA   10/27/2023   After Visit Summary: (MyChart) Due to this being a telephonic visit, the after visit summary with patients personalized plan was offered to patient via MyChart   Notes: Please refer to Routing Comments.

## 2023-11-05 ENCOUNTER — Other Ambulatory Visit
Admission: RE | Admit: 2023-11-05 | Discharge: 2023-11-05 | Disposition: A | Discharge status: Home / Self Care | Attending: Urology | Admitting: Urology

## 2023-11-05 ENCOUNTER — Ambulatory Visit (INDEPENDENT_AMBULATORY_CARE_PROVIDER_SITE_OTHER): Payer: Self-pay | Admitting: Urology

## 2023-11-05 ENCOUNTER — Other Ambulatory Visit: Payer: Self-pay

## 2023-11-05 VITALS — BP 149/73 | HR 57 | Ht 72.0 in | Wt 224.2 lb

## 2023-11-05 DIAGNOSIS — C679 Malignant neoplasm of bladder, unspecified: Secondary | ICD-10-CM | POA: Diagnosis not present

## 2023-11-05 DIAGNOSIS — R3129 Other microscopic hematuria: Secondary | ICD-10-CM | POA: Insufficient documentation

## 2023-11-05 DIAGNOSIS — Z8551 Personal history of malignant neoplasm of bladder: Secondary | ICD-10-CM | POA: Diagnosis not present

## 2023-11-05 DIAGNOSIS — N138 Other obstructive and reflux uropathy: Secondary | ICD-10-CM

## 2023-11-05 DIAGNOSIS — C642 Malignant neoplasm of left kidney, except renal pelvis: Secondary | ICD-10-CM

## 2023-11-05 DIAGNOSIS — N401 Enlarged prostate with lower urinary tract symptoms: Secondary | ICD-10-CM | POA: Diagnosis not present

## 2023-11-05 LAB — URINALYSIS, COMPLETE (UACMP) WITH MICROSCOPIC
Bacteria, UA: NONE SEEN
Bilirubin Urine: NEGATIVE
Glucose, UA: NEGATIVE mg/dL
Hgb urine dipstick: NEGATIVE
Ketones, ur: NEGATIVE mg/dL
Leukocytes,Ua: NEGATIVE
Nitrite: NEGATIVE
Protein, ur: NEGATIVE mg/dL
Specific Gravity, Urine: 1.01 (ref 1.005–1.030)
pH: 6.5 (ref 5.0–8.0)

## 2023-11-05 NOTE — Progress Notes (Signed)
   11/05/23 CC:  Chief Complaint  Patient presents with   Cysto     HPI: David Mccullough is a 78 y.o. male  with a personal history of renal cell carcinoma of left kidney, malignant neoplasm of urinary bladder,urinary urgency, incomplete bladder emptying, elevated PSA, microscopic hematuria, and pulmonary nodule. He presents today for a cystostopy.   Renal biopsy on 06/26/2021 surgical pathology revealed renal cell carcinoma, conventional clear-cell type, nuclear grade 2.   He is s/p TURBT with instillation of intravesical chemotherapy on 07/28/2021.Intraoperative findings: Flat broad-based superficial appearing 1.5 cm papillary left posterior bladder wall tumor consistent with transitional cell carcinoma.  No other lesions in the bladder identified.  Mildly trabeculated bladder.   Surgical pathology of bladder specimen consistent with  non-invasive papillary urothelial carcinoma, low grade.   He is s/p hand-assisted laparoscopic left radical nephrectomy on 09/08/2021.   Followed annually by Dr. Adrian Alba, last imaging 08/2023 negative for recurrence.    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 pT1bNxMo.    S/p HoLEP, off all meds doing very well.  Occasional urgency but no other complaints.  No incontinence.    NED. A&Ox3.   No respiratory distress   Abd soft, NT, ND Normal phallus with bilateral descended testicles  Cystoscopy Procedure Note  Patient identification was confirmed, informed consent was obtained, and patient was prepped using Betadine solution.  Lidocaine  jelly was administered per urethral meatus.     Pre-Procedure: - Inspection reveals a normal caliber ureteral meatus.  Procedure: The flexible cystoscope was introduced without difficulty - No urethral strictures/lesions are present. - HoLEP defect with sparing at apex - Normal bladder neck - Bilateral ureteral orifices identified - Bladder mucosa  reveals no ulcers,  tumors, or lesions - No bladder stones - Mild trabeculation with diverticulum at dome - Previous TURBT site appreciated, on the left lateral bladder wall.   Retroflexion shows HoLEP defect   Post-Procedure: - Patient tolerated the procedure well   Assessment/ Plan:  Bladder cancer TCC  -No obvious disease today  -cysto 1 year  2. Renal cell carcinoma  - Will undergo surveillance imaging with Dr Adrian Alba  3. Urge incontinence -Improving, off meds  4. BPH -s/p HoLEP   Follow-up cystoscopy in 12  months   Dustin Gimenez, MD

## 2023-11-26 DIAGNOSIS — H2513 Age-related nuclear cataract, bilateral: Secondary | ICD-10-CM | POA: Diagnosis not present

## 2023-11-26 DIAGNOSIS — H18009 Unspecified corneal deposit, unspecified eye: Secondary | ICD-10-CM | POA: Diagnosis not present

## 2023-11-26 DIAGNOSIS — H43813 Vitreous degeneration, bilateral: Secondary | ICD-10-CM | POA: Diagnosis not present

## 2023-12-20 DIAGNOSIS — Z9889 Other specified postprocedural states: Secondary | ICD-10-CM | POA: Diagnosis not present

## 2023-12-20 DIAGNOSIS — I2510 Atherosclerotic heart disease of native coronary artery without angina pectoris: Secondary | ICD-10-CM | POA: Diagnosis not present

## 2023-12-20 DIAGNOSIS — I429 Cardiomyopathy, unspecified: Secondary | ICD-10-CM | POA: Diagnosis not present

## 2023-12-20 DIAGNOSIS — N183 Chronic kidney disease, stage 3 unspecified: Secondary | ICD-10-CM | POA: Diagnosis not present

## 2023-12-20 DIAGNOSIS — Z8679 Personal history of other diseases of the circulatory system: Secondary | ICD-10-CM | POA: Diagnosis not present

## 2023-12-20 DIAGNOSIS — I4891 Unspecified atrial fibrillation: Secondary | ICD-10-CM | POA: Diagnosis not present

## 2023-12-20 DIAGNOSIS — I214 Non-ST elevation (NSTEMI) myocardial infarction: Secondary | ICD-10-CM | POA: Diagnosis not present

## 2023-12-20 DIAGNOSIS — I10 Essential (primary) hypertension: Secondary | ICD-10-CM | POA: Diagnosis not present

## 2023-12-20 DIAGNOSIS — Z951 Presence of aortocoronary bypass graft: Secondary | ICD-10-CM | POA: Diagnosis not present

## 2023-12-20 DIAGNOSIS — G4733 Obstructive sleep apnea (adult) (pediatric): Secondary | ICD-10-CM | POA: Diagnosis not present

## 2023-12-20 DIAGNOSIS — R0602 Shortness of breath: Secondary | ICD-10-CM | POA: Diagnosis not present

## 2023-12-20 DIAGNOSIS — I5022 Chronic systolic (congestive) heart failure: Secondary | ICD-10-CM | POA: Diagnosis not present

## 2024-01-05 DIAGNOSIS — C4359 Malignant melanoma of other part of trunk: Secondary | ICD-10-CM | POA: Diagnosis not present

## 2024-01-05 DIAGNOSIS — D485 Neoplasm of uncertain behavior of skin: Secondary | ICD-10-CM | POA: Diagnosis not present

## 2024-01-05 DIAGNOSIS — D225 Melanocytic nevi of trunk: Secondary | ICD-10-CM | POA: Diagnosis not present

## 2024-01-24 DIAGNOSIS — Z905 Acquired absence of kidney: Secondary | ICD-10-CM | POA: Diagnosis not present

## 2024-01-24 DIAGNOSIS — N2581 Secondary hyperparathyroidism of renal origin: Secondary | ICD-10-CM | POA: Diagnosis not present

## 2024-01-24 DIAGNOSIS — I10 Essential (primary) hypertension: Secondary | ICD-10-CM | POA: Diagnosis not present

## 2024-01-24 DIAGNOSIS — N1832 Chronic kidney disease, stage 3b: Secondary | ICD-10-CM | POA: Diagnosis not present

## 2024-01-24 DIAGNOSIS — D631 Anemia in chronic kidney disease: Secondary | ICD-10-CM | POA: Diagnosis not present

## 2024-01-24 DIAGNOSIS — C439 Malignant melanoma of skin, unspecified: Secondary | ICD-10-CM | POA: Diagnosis not present

## 2024-01-26 DIAGNOSIS — C4359 Malignant melanoma of other part of trunk: Secondary | ICD-10-CM | POA: Diagnosis not present

## 2024-01-28 DIAGNOSIS — C4359 Malignant melanoma of other part of trunk: Secondary | ICD-10-CM | POA: Diagnosis not present

## 2024-01-28 DIAGNOSIS — D0359 Melanoma in situ of other part of trunk: Secondary | ICD-10-CM | POA: Diagnosis not present

## 2024-02-09 DIAGNOSIS — R918 Other nonspecific abnormal finding of lung field: Secondary | ICD-10-CM | POA: Diagnosis not present

## 2024-02-09 DIAGNOSIS — R0602 Shortness of breath: Secondary | ICD-10-CM | POA: Diagnosis not present

## 2024-02-09 DIAGNOSIS — G4733 Obstructive sleep apnea (adult) (pediatric): Secondary | ICD-10-CM | POA: Diagnosis not present

## 2024-02-09 DIAGNOSIS — I272 Pulmonary hypertension, unspecified: Secondary | ICD-10-CM | POA: Diagnosis not present

## 2024-02-09 NOTE — Progress Notes (Signed)
 Surgical Oncology Post op Visit  Date of Visit:  02/16/2024  Melanoma History: [id number], Stage IA  Melanoma of mid upper back s/p WLE on 01/28/24.  Molecular Testing: n/a  Referring/Comanaging: Dermatology: Shasta Ranger, MD  Current Status: David Mccullough, a 78 y.o. male returns for a postop follow-up visit after WLE of the mid upper back.  He has had no concerning symptomatology. He states that the area has drained a little bit but denies any fever or chills. He states that he has been feeling well.    Physical Examination:   VS:  BP 127/71 (BP Location: Left upper arm, Patient Position: Sitting, BP Cuff Size: Adult)   Pulse 61   Temp 36.2 C (97.2 F) (Oral)   Resp 18   Ht 182.9 cm (6' 0.01)   Wt 98.8 kg (217 lb 13 oz)   SpO2 99%   BMI 29.53 kg/m .  The patient is well appearing and in no distress.  The incision is healing there is a soft fluctuant bump in the middle of the incision with surrounding erythema, tender to palpation.  See clinic photo   Pathology: DIAGNOSIS  A.  Mid upper back, excision of skin:   FOCAL RESIDUAL ATYPICAL JUNCTIONAL MELANOCYTIC PROLIFERATION ASSOCIATED WITH HEALING SURGICAL WOUND, CONSISTENT WITH TRAILING EDGE OF MELANOMA IN-SITU, MARGINS ARE NEGATIVE FOR MELANOMA. SEE NOTE.    NOTE: An incidental seborrheic keratosis is noted. Additionally, there is an incidental atypical compound melanocytic nevus with moderate atypia, for which the margins are negative. Immunohistochemical studies have been performed following histomorphologic examination to determine the nature of this melanocytic proliferation (A11). Oren 1/Ki-67 shows no significant proliferation in the dermal melanocytes and shows prominent single cell growth with isolated upward migration in the epidermal component.  PRAME immunostain is interpreted as negative.    Immunohistochemical studies were also performed to evaluate the density of melanocytes and determine the presence of  residual melanocytic proliferation (A5-A6). Mart-1 shows an area of increased density of single melanocytes with occasional upward migration adjacent to the healing surgical wound (prior biopsy site). PRAME immunostain highlights a subset of the junctional melanocytes. Overall, in the context of this melanoma excision, the junctional melanocytic proliferation is consistent with the trailing edge of melanoma in-situ. No definitive residual invasive melanoma is seen in these sections. The margins are negative for melanoma. Refer to prior biopsy report ([id number]) for complete melanoma staging parameters.     Electronically signed by Elaina Delon Browning, MD on 02/07/2024 at 1001 EDT    Assessment:   78 y.o. 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 He does not currently have a follow-up scheduled with dermatology. We reviewed his pathology.   No diagnosis found.   Plan: Keflex  sent to his pharmacy to treat local skin infection, he will plan to send us  a photo in one week to check on infection.  1.  We will plan to see him back in PRN clinical follow-up.  2.  We recommended continuing follow-up with Dr. Ranger of Dermatology, patient will reach out to schedule an appointment.  3.  He was instructed to contact us  regarding any questions or concerning symptomatology.   I spent a total of 15 minutes in both face-to-face and non-face-to-face activities, excluding procedures performed, for this visit on the date of this encounter.    Attestation Statement:   I personally performed the service, non-incident to. [facility])   MARY CATHERINE [place],  PA

## 2024-02-10 ENCOUNTER — Encounter: Payer: Self-pay | Admitting: Student

## 2024-02-10 ENCOUNTER — Ambulatory Visit (INDEPENDENT_AMBULATORY_CARE_PROVIDER_SITE_OTHER): Admitting: Student

## 2024-02-10 VITALS — BP 120/74 | HR 64 | Ht 72.0 in | Wt 218.5 lb

## 2024-02-10 DIAGNOSIS — N2581 Secondary hyperparathyroidism of renal origin: Secondary | ICD-10-CM

## 2024-02-10 DIAGNOSIS — G4700 Insomnia, unspecified: Secondary | ICD-10-CM | POA: Diagnosis not present

## 2024-02-10 DIAGNOSIS — C642 Malignant neoplasm of left kidney, except renal pelvis: Secondary | ICD-10-CM | POA: Diagnosis not present

## 2024-02-10 DIAGNOSIS — G4733 Obstructive sleep apnea (adult) (pediatric): Secondary | ICD-10-CM | POA: Insufficient documentation

## 2024-02-10 DIAGNOSIS — I5022 Chronic systolic (congestive) heart failure: Secondary | ICD-10-CM

## 2024-02-10 DIAGNOSIS — Z8551 Personal history of malignant neoplasm of bladder: Secondary | ICD-10-CM | POA: Diagnosis not present

## 2024-02-10 DIAGNOSIS — I2510 Atherosclerotic heart disease of native coronary artery without angina pectoris: Secondary | ICD-10-CM

## 2024-02-10 DIAGNOSIS — I480 Paroxysmal atrial fibrillation: Secondary | ICD-10-CM

## 2024-02-10 DIAGNOSIS — N1832 Chronic kidney disease, stage 3b: Secondary | ICD-10-CM | POA: Diagnosis not present

## 2024-02-10 MED ORDER — TRAZODONE HCL 50 MG PO TABS: ORAL_TABLET | ORAL | 1 refills | Status: DC

## 2024-02-10 NOTE — Assessment & Plan Note (Signed)
 Sleeping well with trazodone  50 mg daily. Denies dizziness or balance with this. Refilled this today.

## 2024-02-10 NOTE — Patient Instructions (Addendum)
 Please pick up vitamin D 2000 units once a day.  Please go to the nephrology office to get repeat labs in 2 weeks, please call them if you have questions regarding this. Please ask the nephrologist if you can restart taking spironolactone 25 mg daily after you getting your repeat labs Please increase atorvastatin to 40 mg daily

## 2024-02-10 NOTE — Progress Notes (Signed)
 Established Patient Office Visit  Subjective   Patient ID: David Mccullough, male    DOB: [birth year]  Age: 78 y.o. MRN: [id number]  Chief Complaint  Patient presents with   Transitions Of Care    Patient is here to transition from Dr. 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 Joshua recently retired. Feeling at baseline today, would like refills on trazodone, feels this is working well for sleep. Reports not increase in LE swelling, chest pain, or DOE.   Patient Active Problem List   Diagnosis Date Noted   CAD, multiple vessel 02/11/2024   CKD (chronic kidney disease) stage 3, GFR 30-59 ml/min (HCC) 02/11/2024   Secondary hyperparathyroidism (HCC) 02/11/2024   History of bladder cancer 02/11/2024   Atrial fibrillation (HCC) 02/11/2024   Insomnia 02/10/2024   OSA (obstructive sleep apnea) 02/10/2024   Heart failure with mildly reduced ejection fraction (HCC) 06/01/2023   Renal cell adenocarcinoma, left (HCC) 08/18/2021   Elevated PSA 08/02/2018   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      ROS Refer to HPI    Objective:     BP 120/74   Pulse 64   Ht 6' (1.829 m)   Wt 218 lb 8 oz (99.1 kg)   SpO2 97%   BMI 29.63 kg/m  BP Readings from Last 3 Encounters:  02/10/24 120/74  11/05/23 (!) 149/73  09/07/23 (!) 140/80    Physical Exam Constitutional:      Comments: Chronically ill appearing  HENT:     Mouth/Throat:     Mouth: Mucous membranes are moist.     Pharynx: Oropharynx is  clear.  Cardiovascular:     Rate and Rhythm: Normal rate and regular rhythm.     Pulses: Normal pulses.     Heart sounds: No murmur heard. Pulmonary:     Breath sounds: Rhonchi (intermittent, expiratory) present. No rales.     Comments: Increased effort, pursed lip breathing Abdominal:     General: Abdomen is flat. Bowel sounds are normal. There is no distension.     Palpations: Abdomen is soft.     Tenderness: There is no abdominal tenderness.  Musculoskeletal:        General: Normal range of motion.     Right lower leg: Edema (1+ to mid shin) present.     Left lower leg: Edema (1+ to mid shin) present.  Skin:    General: Skin is warm and dry.     Capillary Refill: Capillary refill takes less than 2 seconds.  Neurological:     General: No focal deficit present.     Mental Status: He is alert and oriented to person, place, and time.  Psychiatric:        Mood and Affect: Mood normal.        Behavior: Behavior normal.        02/10/2024   10:01 AM 10/27/2023   10:17 AM 08/20/2023   10:11 AM  Depression screen PHQ 2/9  Decreased Interest 0 0 0  Down, Depressed, Hopeless 0  0 0  PHQ - 2 Score 0 0 0  Altered sleeping 0 0 0  Tired, decreased energy 0 0 0  Change in appetite 0 0 1  Feeling bad or failure about yourself  0 0 0  Trouble concentrating 0 0 0  Moving slowly or fidgety/restless 0 0 0  Suicidal thoughts 0 0 0  PHQ-9 Score 0 0 1  Difficult doing work/chores Not difficult at all Not difficult at all Not difficult at all       02/10/2024   10:01 AM 08/20/2023   10:12 AM 03/04/2023    1:13 PM 10/19/2022    9:07 AM  GAD 7 : Generalized Anxiety Score  Nervous, Anxious, on Edge 0 0 0 0  Control/stop worrying 0 0 0 0  Worry too much - different things 0 0 0 0  Trouble relaxing 0 0 0 0  Restless 0 0 0 0  Easily annoyed or irritable 0 0 0 0  Afraid - awful might happen 0 0 0 0  Total GAD 7 Score 0 0 0 0  Anxiety Difficulty Not difficult at all Not difficult at all Not  difficult at all Not difficult at all    No results found for any visits on 02/10/24.  Last CBC Lab Results  Component Value Date   WBC 6.3 08/30/2023   HGB 13.2 08/30/2023   HCT 38.4 (L) 08/30/2023   MCV 88.9 08/30/2023   MCH 30.6 08/30/2023   RDW 13.0 08/30/2023   PLT 153 08/30/2023   Last metabolic panel Lab Results  Component Value Date   GLUCOSE 100 (H) 08/30/2023   NA 138 08/30/2023   K 4.2 08/30/2023   CL 104 08/30/2023   CO2 27 08/30/2023   BUN 22 08/30/2023   CREATININE 2.04 (H) 08/30/2023   GFRNONAA 33 (L) 08/30/2023   CALCIUM 9.3 08/30/2023   PROT 6.8 08/30/2023   ALBUMIN 4.1 08/30/2023   BILITOT 0.7 08/30/2023   ALKPHOS 40 08/30/2023   AST 25 08/30/2023   ALT 19 08/30/2023   ANIONGAP 7 08/30/2023        Assessment & Plan:  Insomnia, unspecified type Assessment & Plan: Sleeping well with trazodone 50 mg daily. Denies dizziness or balance with this. Refilled this today.   Orders: -     traZODone HCl; Take 1 tablet by mouth daily  Dispense: 90 tablet; Refill: 1  OSA (obstructive sleep apnea) Assessment & Plan: Using CPAP daily.    Renal cell adenocarcinoma, left [facility]) Assessment & Plan: Monitored by oncology with year CT, CT scan on August 30, 2023 without signs of recurrence. Plan for yearly surveillance imaging for 5 years.    Heart failure with mildly reduced ejection fraction [facility]) Assessment & Plan: Followed by cardiology, GDMT currently with losartan 25 mg daily, Jardiance 10 mg daily, and spironolactone 25 mg daily. Unable to tolerate BB due to bradycardia. On lasix 20 mg daily. Weight is stable.  He is not currently taking spironolactone.  Does not appear he was told to discontinue this on chart review.  He did have recent decline in kidney function with GFR of 29 most recently on 7/28. He is due for repeat RFP in 2 week. Will have him discuss with nephology if he can restart this if GFR is back to baseline after he has labs. Patient expressed  understanding.    CAD, multiple vessel Assessment & Plan: Appears he was on atorvastatin 80 mg daily but  reports has only been taking  half tablet a day.  Denies any side effects with this medication.  He is not sure why he has only been taking half tablet today.  I do not see any notes where atorvastatin was decreased at his prior visits this year.  LDL was elevated to 105 when checked in February.  Discussed having him increase to 80 mg daily.  Lipid panel at next visit.   Stage 3b chronic kidney disease (HCC) Assessment & Plan: Creatinine was elevated on 01/24/2024 to 2.22 with GFR of 29.  His typical baseline creatinine is around 1.9 with GFR of 35. Due for 1 month f/u labs with nephology, patient was unaware of this. Reminded him that need need to go back to have labs in about 2 weeks. He expresses understanding.     Secondary hyperparathyroidism (HCC) Assessment & Plan: Secondary of CKD IIIb, has not started vitamin D, advised he pick up vitamin D 2000 units daily and follow with labs with nephrology.    History of bladder cancer Assessment & Plan: Followed by urology with yearly cysto.   Paroxysmal atrial fibrillation [facility]) Assessment & Plan: s/p ablation, on amiodarone and eliquis      Return in about 4 months (around 06/11/2024) for Cholestrol .    Harlene Saddler, MD

## 2024-02-11 ENCOUNTER — Encounter: Payer: Self-pay | Admitting: Student

## 2024-02-11 DIAGNOSIS — Z8551 Personal history of malignant neoplasm of bladder: Secondary | ICD-10-CM | POA: Insufficient documentation

## 2024-02-11 DIAGNOSIS — I2510 Atherosclerotic heart disease of native coronary artery without angina pectoris: Secondary | ICD-10-CM | POA: Insufficient documentation

## 2024-02-11 DIAGNOSIS — N183 Chronic kidney disease, stage 3 unspecified: Secondary | ICD-10-CM | POA: Insufficient documentation

## 2024-02-11 DIAGNOSIS — N2581 Secondary hyperparathyroidism of renal origin: Secondary | ICD-10-CM | POA: Insufficient documentation

## 2024-02-11 DIAGNOSIS — I4891 Unspecified atrial fibrillation: Secondary | ICD-10-CM | POA: Insufficient documentation

## 2024-02-11 NOTE — Assessment & Plan Note (Deleted)
 S/p CABGx3 in 2019 CARDIAC MRI  11/18/2022 1. Normal LV size, mild to moderately reduced systolic function. LVEF 41%.   2. Subendocardial, near transmural LGE/scar in the basal inferior and inferolateral left ventricular myocardium.   3. LGE/Scar comprises 10g (about 8%) of total myocardial mass.   4. Moderately reduced RV function.   5. No significant valvular abnormalities.   6. 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

## 2024-02-11 NOTE — Assessment & Plan Note (Addendum)
 s/p ablation, on amiodarone  and eliquis

## 2024-02-11 NOTE — Assessment & Plan Note (Signed)
 Using CPAP daily

## 2024-02-11 NOTE — Assessment & Plan Note (Addendum)
 Followed by cardiology, GDMT currently with losartan 25 mg daily, Jardiance 10 mg daily, and spironolactone  25 mg daily. Unable to tolerate BB due to bradycardia. On lasix  20 mg daily. Weight is stable.  He is not currently taking spironolactone .  Does not appear he was told to discontinue this on chart review.  He did have recent decline in kidney function with GFR of 29 most recently on 7/28. He is due for repeat RFP in 2 week. Will have him discuss with nephology if he can restart this if GFR is back to baseline after he has labs. Patient expressed understanding.

## 2024-02-11 NOTE — Assessment & Plan Note (Signed)
 Secondary of CKD IIIb, has not started vitamin D, advised he pick up vitamin D 2000 units daily and follow with labs with nephrology.

## 2024-02-11 NOTE — Assessment & Plan Note (Addendum)
 Appears he was on atorvastatin  80 mg daily but  reports has only been taking half tablet a day.  Denies any side effects with this medication.  He is not sure why he has only been taking half tablet today.  I do not see any notes where atorvastatin  was decreased at his prior visits this year.  LDL was elevated to 105 when checked in February.  Discussed having him increase to 80 mg daily.  Lipid panel at next visit.

## 2024-02-11 NOTE — Assessment & Plan Note (Addendum)
 Monitored by oncology with year CT, CT scan on August 30, 2023 without signs of recurrence. Plan for yearly surveillance imaging for 5 years.

## 2024-02-11 NOTE — Assessment & Plan Note (Signed)
 Followed by urology with yearly cysto.

## 2024-02-11 NOTE — Assessment & Plan Note (Signed)
 Creatinine was elevated on 01/24/2024 to 2.22 with GFR of 29.  His typical baseline creatinine is around 1.9 with GFR of 35. Due for 1 month f/u labs with nephology, patient was unaware of this. Reminded him that need need to go back to have labs in about 2 weeks. He expresses understanding.

## 2024-02-16 DIAGNOSIS — C4359 Malignant melanoma of other part of trunk: Secondary | ICD-10-CM | POA: Diagnosis not present

## 2024-02-17 ENCOUNTER — Ambulatory Visit: Payer: Medicare Other | Admitting: Student

## 2024-03-21 DIAGNOSIS — D485 Neoplasm of uncertain behavior of skin: Secondary | ICD-10-CM | POA: Diagnosis not present

## 2024-03-21 DIAGNOSIS — Z85820 Personal history of malignant melanoma of skin: Secondary | ICD-10-CM | POA: Diagnosis not present

## 2024-03-21 DIAGNOSIS — D225 Melanocytic nevi of trunk: Secondary | ICD-10-CM | POA: Diagnosis not present

## 2024-03-21 DIAGNOSIS — L578 Other skin changes due to chronic exposure to nonionizing radiation: Secondary | ICD-10-CM | POA: Diagnosis not present

## 2024-03-21 DIAGNOSIS — L820 Inflamed seborrheic keratosis: Secondary | ICD-10-CM | POA: Diagnosis not present

## 2024-05-30 DIAGNOSIS — N2581 Secondary hyperparathyroidism of renal origin: Secondary | ICD-10-CM | POA: Diagnosis not present

## 2024-05-30 DIAGNOSIS — N184 Chronic kidney disease, stage 4 (severe): Secondary | ICD-10-CM | POA: Diagnosis not present

## 2024-05-30 DIAGNOSIS — D631 Anemia in chronic kidney disease: Secondary | ICD-10-CM | POA: Diagnosis not present

## 2024-05-30 DIAGNOSIS — I10 Essential (primary) hypertension: Secondary | ICD-10-CM | POA: Diagnosis not present

## 2024-05-30 NOTE — Progress Notes (Signed)
 Follow Up Visit   Patient Name: David Mccullough, male   Patient DOB: [birth year] Date of Service: 05/30/2024  Patient MRN: [id number] Provider Creating Note: Bonnell Sherry, MD  [phone] Primary Care Physician:   [address] [place] [address] Additional Physicians/ Providers:    History of Present Illness David Mccullough is a 78 y.o. male who is following up today for chronic kidney disease stage IIIb status post left nephrectomy, hypertension, and secondary hyperparathyroidism.  The patient's chronic kidney disease appears to be relatively stable most recent eGFR 29.  In regards hypertension blood pressure currently 117/70.  Patient also has secondary hyperparathyroidism with most recent PTH of 82, phosphorus 3.1, calcium  9.8.  He also has history of anemia of chronic kidney disease which has improved with most recent hemoglobin of 13.3.  Patient does report excessive numbers of urination during the daytime.  He does not have significant nocturia.  Medications   Current Outpatient Medications:    atorvastatin  (LIPITOR ) 40 MG tablet, Take 40 mg by mouth in the morning. (Patient taking differently: Take 20 mg by mouth in the morning.), Disp: , Rfl:    Cholecalciferol (Vitamin D3) 50 MCG (2000 UT) tablet, Take by mouth, Disp: , Rfl:    amiodarone  (PACERONE ) 100 MG tablet, Take 100 mg by mouth in the morning., Disp: , Rfl:    Eliquis 5 MG tablet, , Disp: , Rfl:    Empagliflozin 10 MG tablet, Take 10 mg by mouth in the morning., Disp: , Rfl:    losartan (COZAAR) 25 MG tablet, Take 25 mg by mouth 1 (one) time each day, Disp: , Rfl:    Multiple Vitamins-Minerals (multivitamin with minerals) tablet, Take 1 tablet by mouth 1 (one) time each day, Disp: , Rfl:    OMEGA-3 FATTY ACIDS PO, Take 1 tablet by mouth 1 (one) time each day, Disp: , Rfl:    traZODone  (DESYREL ) 50 MG tablet, , Disp: , Rfl:    Allergies Patient has no known allergies.  Problem List There is no problem list on file  for this patient.    Review of Systems  Constitutional:  Negative for chills and fever.  Respiratory:  Negative for cough and shortness of breath.   Cardiovascular:  Negative for chest pain and palpitations.  Gastrointestinal:  Negative for nausea and vomiting.  Genitourinary:  Negative for dysuria, hematuria and urgency.     History Past Medical History:  Diagnosis Date   Acute myocardial infarction, unspecified site, episode of care unspecified (HCC)    Atrial fibrillation (HCC)    Bladder cancer (HCC)    Coronary atherosclerosis of unspecified type of vessel, native or graft    Elevated prostate specific antigen (PSA)    Esophageal reflux    Nephrolithiasis    Nephrolithiasis    Other and unspecified hyperlipidemia    Other malignant neoplasm of unspecified site (HCC)    Supraventricular tachycardia (HCC)     Past Surgical History:  Procedure Laterality Date   CORONARY ARTERY BYPASS GRAFT     NEPHRECTOMY Left    Family History  Problem Relation Age of Onset   Heart disease Mother    Heart disease Father    Heart disease Sister    Social History   Tobacco Use   Smoking status: Former    Types: Cigarettes   Smokeless tobacco: Never  Substance Use Topics   Alcohol use: Yes    Comment: moderate Wine occasionally        Physical Exam  Vitals BP 117/70 (BP Location: Right upper arm, Patient Position: Sitting)   Pulse 62   Temp 97.6 F   Wt 222 lb (101 kg)   SpO2 98%   BMI 30.11 kg/m   PHYSICAL EXAM: General appearance: well developed, well nourished, NAD Eyes: anicteric sclerae, moist conjunctivae; no lid-lag  HENT: Atraumatic; hearing intact Neck: Trachea midline; supple Lungs: CTAB, with normal respiratory effort  CV: S1S2, no murmurs or rubs. Abdomen: Soft, non-tender; bowel sounds present Extremities: No peripheral edema Skin: Warm and dry, normal skin turgor, no rashes noted. Psych: Appropriate affect, alert and oriented  to person, place and time    Laboratory Studies  Chemistry  Lab Units 01/24/24 1103 09/20/23 1025 05/20/23 1052 01/14/23 1030 12/28/22 1425 09/10/22 1002  SODIUM mmol/L 137 137 134* 138 138 142  POTASSIUM mmol/L 4.3 4.1 4.1 4.2 4.2 4.4  CHLORIDE mmol/L 102 102 100 105 106 106  CO2 mmol/L 27 27 26 25  28.4 29  ANION GAP   --   --   --   --  7.8  --   CALCIUM  mg/dL 9.8 9.2 9.3 9.6 9.1 9.8  PHOSPHORUS mg/dL 3.1 3.1 3.1 3.0  --  3.1  PTH pg/mL 82* 74 59 57  --  59  GLUCOSE mg/dL 98 87 88 78 97 88  ALBUMIN  g/dL 4.3 4.2 4.2 4.2  --  4.0  BUN mg/dL 22 19 22 22 21 22   CREATININE mg/dL 7.77* 8.05* 8.05* 8.05* 1.9* 1.78*        No lab exists for component: IRON SATURATION, TRANSSATPER  CBC  Lab Units 01/24/24 1103 09/20/23 1025 05/20/23 1052 01/14/23 1030 09/10/22 1002  WBC AUTO Thousand/uL 7.0 6.0 6.5 5.9 6.4  HEMOGLOBIN g/dL 86.6 86.9* 86.9* 86.3 86.6  HEMOGLOBIN URINE  NEGATIVE NEGATIVE  --   --   --   HEMATOCRIT % 39.4 38.6 38.7 41.1 40.2  MCV fL 90.8 90.2 88.4 86.0 84.5  PLATELETS AUTO Thousand/uL 158 164 151 176 187    Urine  Lab Units 01/24/24 1103 09/20/23 1025 05/20/23 1052 01/14/23 1030 09/10/22 1002  COLOR U  YELLOW YELLOW  --   --   --   KETONES U MG/DL  NEGATIVE NEGATIVE  --   --   --   PROT/CREAT RATIO UR mg/g creat NOTE  NOTE  --   --   --   --   ALB MG/G CREAT UR mg/g creat  --  10 11 NOTE NOTE    Lab Results  Component Value Date   PTH 82 (H) 01/24/2024   CALCIUM  9.8 01/24/2024   PHOS 3.1 01/24/2024     Imaging and Other Studies  11/27/2021: Negative SPEP/UPEP.  Positive ANA with negative secondary panel. 12/10/2021 renal ultrasound: Right kidney 11.6 cm, increased echogenicity noted.   Orders Placed This Encounter   CBC and Differential   Renal Function Panel   PTH, Intact   Urine Albumin  / Creatinine Ratio        Impression/Recommendations  David Mccullough is a 78 y.o. male with past medical history of myocardial infarction,  coronary artery disease status post three-vessel CABG, atrial fibrillation, bladder cancer status post TURBT 07/28/2021, GERD, nephrolithiasis, hyperlipidemia left renal cell carcinoma status post left radical nephrectomy 09/08/2021 who was referred the evaluation of chronic kidney disease stage IIIb post nephrectomy.   1.  Chronic kidney disease stage IIIb status post left nephrectomy 09/08/2021.  The patient's chronic kidney disease appears to be relatively stable with most recent eGFR  of 29.  He will be maintained on losartan.  He has an excessive number of urinations during the day.  Therefore we will trial him off of Jardiance for now.  If this makes a difference in the number of urinations and he will stay off the medication otherwise he will resume the medication after his trial off of it.  Repeat renal parameters today.  2.  Hypertension.  Blood pressure currently 117/70.  Maintain the patient on losartan for hypertension control.  3.  Secondary hyperparathyroidism.  Most recent PTH was 82, phosphorus 2.1, calcium  9.8.  No immediate need for calcitriol phosphorus binders.  4.  Anemia of chronic kidney disease.  Most recent hemoglobin was 13.3.  No immediate need for Epogen.  Repeat CBC today.   Return in about 4 months (around 09/28/2024).   Munsoor Lateef, MD

## 2024-06-08 ENCOUNTER — Encounter: Payer: Self-pay | Admitting: Student

## 2024-06-08 ENCOUNTER — Ambulatory Visit: Admitting: Student

## 2024-06-08 VITALS — BP 116/74 | HR 63 | Ht 72.0 in | Wt 224.0 lb

## 2024-06-08 DIAGNOSIS — I2510 Atherosclerotic heart disease of native coronary artery without angina pectoris: Secondary | ICD-10-CM

## 2024-06-08 DIAGNOSIS — Z23 Encounter for immunization: Secondary | ICD-10-CM | POA: Diagnosis not present

## 2024-06-08 DIAGNOSIS — G4700 Insomnia, unspecified: Secondary | ICD-10-CM | POA: Diagnosis not present

## 2024-06-08 NOTE — Assessment & Plan Note (Signed)
 Stable on trazodone  50 mg at night. Refill this today.

## 2024-06-08 NOTE — Patient Instructions (Addendum)
 Please discuss with Dr. Florencio if you need to restart taking lasix  or spironolactone  Please stop taking jardiance   Please go the [facility] for fasting lipid panel

## 2024-06-09 NOTE — Progress Notes (Signed)
 Established Patient Office Visit  Subjective   Patient ID: David Mccullough, male    DOB: 02/17/1946  Age: 78 y.o. MRN: [id number]  Chief Complaint  Patient presents with   Hyperlipidemia    David Mccullough is a 78 y.o. person with medical hx listed below who presents today for follow up of CAD and HLD. Feeling well today. No anginal symptoms today.   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   apixaban (ELIQUIS) 5 MG TABS tablet Take 5 mg by mouth 2 (two) times daily.   atorvastatin  (LIPITOR ) 40 MG tablet Take 40 mg by mouth daily.   empagliflozin (JARDIANCE) 10 MG TABS tablet Take by mouth daily.   losartan (COZAAR) 25 MG tablet Take 25 mg by mouth daily.   Multiple Vitamins-Minerals (MULTIVITAMIN WITH MINERALS) tablet Take 1 tablet by mouth daily.   Omega-3 Fatty Acids (FISH OIL PO) Take 1 capsule by mouth daily.   [DISCONTINUED] traZODone  (DESYREL ) 50 MG tablet Take 1 tablet by mouth daily   furosemide  (LASIX ) 20 MG tablet Take 20 mg by mouth daily. (Patient not taking: Reported on 06/08/2024)   traZODone  (DESYREL ) 50 MG tablet Take 1 tablet by mouth daily   [DISCONTINUED] JARDIANCE 10 MG TABS tablet Take 10 mg by mouth daily. (Patient not taking: Reported on 06/08/2024)   [DISCONTINUED] spironolactone  (ALDACTONE ) 25 MG tablet Take 25  mg by mouth daily. (Patient not taking: Reported on 06/08/2024)   No facility-administered encounter medications on file as of 06/08/2024.    BP 116/74   Pulse 63   Ht 6' (1.829 m)   Wt 224 lb (101.6 kg)   SpO2 98%   BMI 30.38 kg/m  BP Readings from Last 3 Encounters:  06/08/24 116/74  02/10/24 120/74  11/05/23 (!) 149/73    Physical Exam Constitutional:      Appearance: Normal appearance.  HENT:     Head: Normocephalic and atraumatic.  Cardiovascular:     Rate and Rhythm: Normal rate and regular rhythm.  Pulmonary:     Effort: Pulmonary effort is normal.     Breath sounds: No rhonchi or rales.  Abdominal:     General: Abdomen is flat. Bowel sounds are normal. There is no distension.     Palpations: Abdomen is soft.     Tenderness: There is no abdominal tenderness.  Musculoskeletal:        General: Normal range of motion.     Right lower leg: No edema.     Left lower leg: No edema.  Skin:    General: Skin is warm and dry.     Capillary Refill: Capillary refill takes less than 2 seconds.  Neurological:     General: No focal deficit present.     Mental Status: He is alert and oriented to person, place,  and time.  Psychiatric:        Mood and Affect: Mood normal.        Behavior: Behavior normal.        06/08/2024    9:51 AM 02/10/2024   10:01 AM 10/27/2023   10:17 AM  Depression screen PHQ 2/9  Decreased Interest 0 0 0  Down, Depressed, Hopeless 0 0 0  PHQ - 2 Score 0 0 0  Altered sleeping  0 0  Tired, decreased energy  0 0  Change in appetite  0 0  Feeling bad or failure about yourself   0 0  Trouble concentrating  0 0  Moving slowly or fidgety/restless  0 0  Suicidal thoughts  0 0  PHQ-9 Score  0  0   Difficult doing work/chores  Not difficult at all Not difficult at all     Data saved with a previous flowsheet row definition       06/08/2024    9:51 AM 02/10/2024   10:01 AM 08/20/2023   10:12 AM 03/04/2023    1:13 PM  GAD 7 : Generalized Anxiety  Score  Nervous, Anxious, on Edge 0 0 0 0  Control/stop worrying 0 0 0 0  Worry too much - different things  0 0 0  Trouble relaxing  0 0 0  Restless  0 0 0  Easily annoyed or irritable  0 0 0  Afraid - awful might happen  0 0 0  Total GAD 7 Score  0 0 0  Anxiety Difficulty  Not difficult at all Not difficult at all Not difficult at all    No results found for any visits on 06/08/24.  Last CBC Lab Results  Component Value Date   WBC 6.3 08/30/2023   HGB 13.2 08/30/2023   HCT 38.4 (L) 08/30/2023   MCV 88.9 08/30/2023   MCH 30.6 08/30/2023   RDW 13.0 08/30/2023   PLT 153 08/30/2023   Last metabolic panel Lab Results  Component Value Date   GLUCOSE 100 (H) 08/30/2023   NA 138 08/30/2023   K 4.2 08/30/2023   CL 104 08/30/2023   CO2 27 08/30/2023   BUN 22 08/30/2023   CREATININE 2.04 (H) 08/30/2023   GFRNONAA 33 (L) 08/30/2023   CALCIUM  9.3 08/30/2023   PROT 6.8 08/30/2023   ALBUMIN  4.1 08/30/2023   BILITOT 0.7 08/30/2023   ALKPHOS 40 08/30/2023   AST 25 08/30/2023   ALT 19 08/30/2023   ANIONGAP 7 08/30/2023   Last lipids Lab Results  Component Value Date   CHOL 181 08/20/2023   HDL 41 08/20/2023   LDLCALC 102 (H) 08/20/2023   TRIG 190 (H) 08/20/2023   CHOLHDL 4.4 08/20/2023   Last hemoglobin A1c Lab Results  Component Value Date   HGBA1C 5.2 03/17/2018      The ASCVD Risk score (Arnett DK, et al., 2019) failed to calculate for the following reasons:   Risk score cannot be calculated because patient has a medical history suggesting prior/existing ASCVD   * - Cholesterol units were assumed    Assessment & Plan:  Encounter for immunization -     Flu vaccine HIGH DOSE PF(Fluzone Trivalent)  Insomnia, unspecified type Assessment & Plan: Stable on trazodone  50 mg at night. Refill this today.   Orders: -     traZODone  HCl; Take 1 tablet by mouth daily  Dispense: 90 tablet; Refill: 1  CAD, multiple vessel Assessment & Plan: Currently on atorvastatin   40 mg  daily. LDL 102 on 08/20/2023. Repeat lipid panel. Not fasting today, will return for labs.   Orders: -     Lipid panel    Return in about 6 months (around 12/07/2024) for physical.    Harlene Saddler, MD

## 2024-06-09 NOTE — Assessment & Plan Note (Addendum)
 Currently on atorvastatin  40 mg daily. LDL 102 on 08/20/2023. Repeat lipid panel. Not fasting today, will return for labs.

## 2024-06-12 ENCOUNTER — Other Ambulatory Visit
Admission: RE | Admit: 2024-06-12 | Discharge: 2024-06-12 | Disposition: A | Source: Home / Self Care | Attending: Student | Admitting: Student

## 2024-06-12 ENCOUNTER — Ambulatory Visit: Payer: Self-pay | Admitting: Student

## 2024-06-12 DIAGNOSIS — I2510 Atherosclerotic heart disease of native coronary artery without angina pectoris: Secondary | ICD-10-CM | POA: Insufficient documentation

## 2024-06-12 LAB — LIPID PANEL
Cholesterol: 170 mg/dL (ref 0–200)
HDL: 40 mg/dL — ABNORMAL LOW (ref >40)
LDL Cholesterol: 114 mg/dL — ABNORMAL HIGH (ref 0–99)
Total CHOL/HDL Ratio: 4.2 ratio
Triglycerides: 78 mg/dL (ref <150)
VLDL: 16 mg/dL (ref 0–40)

## 2024-06-15 ENCOUNTER — Other Ambulatory Visit: Payer: Self-pay

## 2024-06-15 ENCOUNTER — Telehealth: Payer: Self-pay

## 2024-06-15 NOTE — Telephone Encounter (Signed)
 Copied from CRM [id number]. Topic: Clinical - Lab/Test Results >> Jun 15, 2024  9:09 AM Lonell PEDLAR wrote: Reason for CRM: Patient's wife, Reyan Helle, is calling back regarding patient's recent labs. Please c/b [phone]

## 2024-06-15 NOTE — Telephone Encounter (Signed)
 Spoke to wife cardiology doubled HLD 80MG  medication and started him on new zetia medication. Pt verbalized understanding with labs.  KP

## 2024-09-06 ENCOUNTER — Other Ambulatory Visit

## 2024-09-18 ENCOUNTER — Ambulatory Visit: Admitting: Oncology

## 2024-10-31 ENCOUNTER — Other Ambulatory Visit: Admitting: Urology

## 2024-11-01 ENCOUNTER — Ambulatory Visit

## 2024-12-12 ENCOUNTER — Encounter: Admitting: Student
